# Patient Record
Sex: Female | Born: 1955 | Race: Black or African American | Hispanic: No | Smoking: Never smoker
Health system: Southern US, Community
[De-identification: ages and names within clinical notes are randomized; demographics above are authoritative.]

## PROBLEM LIST (undated history)

## (undated) DIAGNOSIS — M25472 Effusion, left ankle: Secondary | ICD-10-CM

## (undated) DIAGNOSIS — J309 Allergic rhinitis, unspecified: Secondary | ICD-10-CM

## (undated) DIAGNOSIS — T8459XA Infection and inflammatory reaction due to other internal joint prosthesis, initial encounter: Secondary | ICD-10-CM

## (undated) DIAGNOSIS — M2550 Pain in unspecified joint: Secondary | ICD-10-CM

## (undated) DIAGNOSIS — R519 Headache, unspecified: Secondary | ICD-10-CM

## (undated) DIAGNOSIS — M549 Dorsalgia, unspecified: Secondary | ICD-10-CM

## (undated) DIAGNOSIS — T8859XA Other complications of anesthesia, initial encounter: Secondary | ICD-10-CM

## (undated) DIAGNOSIS — R0602 Shortness of breath: Secondary | ICD-10-CM

## (undated) DIAGNOSIS — M25471 Effusion, right ankle: Secondary | ICD-10-CM

## (undated) DIAGNOSIS — K635 Polyp of colon: Secondary | ICD-10-CM

## (undated) DIAGNOSIS — Z86718 Personal history of other venous thrombosis and embolism: Secondary | ICD-10-CM

## (undated) DIAGNOSIS — R7303 Prediabetes: Secondary | ICD-10-CM

## (undated) DIAGNOSIS — Z7901 Long term (current) use of anticoagulants: Secondary | ICD-10-CM

## (undated) DIAGNOSIS — K589 Irritable bowel syndrome without diarrhea: Secondary | ICD-10-CM

## (undated) DIAGNOSIS — K219 Gastro-esophageal reflux disease without esophagitis: Secondary | ICD-10-CM

## (undated) DIAGNOSIS — M25474 Effusion, right foot: Secondary | ICD-10-CM

## (undated) DIAGNOSIS — K5900 Constipation, unspecified: Secondary | ICD-10-CM

## (undated) DIAGNOSIS — I456 Pre-excitation syndrome: Secondary | ICD-10-CM

## (undated) DIAGNOSIS — Z8719 Personal history of other diseases of the digestive system: Secondary | ICD-10-CM

## (undated) DIAGNOSIS — Z96659 Presence of unspecified artificial knee joint: Secondary | ICD-10-CM

## (undated) DIAGNOSIS — E559 Vitamin D deficiency, unspecified: Secondary | ICD-10-CM

## (undated) DIAGNOSIS — Z789 Other specified health status: Secondary | ICD-10-CM

## (undated) DIAGNOSIS — M25475 Effusion, left foot: Secondary | ICD-10-CM

## (undated) DIAGNOSIS — I2699 Other pulmonary embolism without acute cor pulmonale: Secondary | ICD-10-CM

## (undated) DIAGNOSIS — K5790 Diverticulosis of intestine, part unspecified, without perforation or abscess without bleeding: Secondary | ICD-10-CM

## (undated) DIAGNOSIS — I82409 Acute embolism and thrombosis of unspecified deep veins of unspecified lower extremity: Secondary | ICD-10-CM

## (undated) DIAGNOSIS — I10 Essential (primary) hypertension: Secondary | ICD-10-CM

## (undated) DIAGNOSIS — R5383 Other fatigue: Secondary | ICD-10-CM

## (undated) DIAGNOSIS — E785 Hyperlipidemia, unspecified: Secondary | ICD-10-CM

## (undated) DIAGNOSIS — D649 Anemia, unspecified: Secondary | ICD-10-CM

## (undated) DIAGNOSIS — G8929 Other chronic pain: Secondary | ICD-10-CM

## (undated) HISTORY — DX: Polyp of colon: K63.5

## (undated) HISTORY — DX: Effusion, right foot: M25.474

## (undated) HISTORY — DX: Allergic rhinitis, unspecified: J30.9

## (undated) HISTORY — DX: Anemia, unspecified: D64.9

## (undated) HISTORY — PX: DIAGNOSTIC LAPAROSCOPY: SUR761

## (undated) HISTORY — DX: Pre-excitation syndrome: I45.6

## (undated) HISTORY — DX: Other pulmonary embolism without acute cor pulmonale: I26.99

## (undated) HISTORY — DX: Vitamin D deficiency, unspecified: E55.9

## (undated) HISTORY — DX: Dorsalgia, unspecified: M54.9

## (undated) HISTORY — DX: Hyperlipidemia, unspecified: E78.5

## (undated) HISTORY — DX: Headache, unspecified: R51.9

## (undated) HISTORY — PX: WISDOM TOOTH EXTRACTION: SHX21

## (undated) HISTORY — DX: Infection and inflammatory reaction due to other internal joint prosthesis, initial encounter: T84.59XA

## (undated) HISTORY — DX: Presence of unspecified artificial knee joint: Z96.659

## (undated) HISTORY — PX: TUBAL LIGATION: SHX77

## (undated) HISTORY — DX: Effusion, left ankle: M25.475

## (undated) HISTORY — DX: Gastro-esophageal reflux disease without esophagitis: K21.9

## (undated) HISTORY — DX: Pain in unspecified joint: M25.50

## (undated) HISTORY — PX: KNEE ARTHROSCOPY: SUR90

## (undated) HISTORY — DX: Effusion, left foot: M25.471

## (undated) HISTORY — PX: CHOLECYSTECTOMY: SHX55

## (undated) HISTORY — DX: Long term (current) use of anticoagulants: Z79.01

## (undated) HISTORY — DX: Diverticulosis of intestine, part unspecified, without perforation or abscess without bleeding: K57.90

## (undated) HISTORY — DX: Shortness of breath: R06.02

## (undated) HISTORY — DX: Irritable bowel syndrome, unspecified: K58.9

## (undated) HISTORY — DX: Acute embolism and thrombosis of unspecified deep veins of unspecified lower extremity: I82.409

## (undated) HISTORY — DX: Other fatigue: R53.83

## (undated) HISTORY — DX: Constipation, unspecified: K59.00

## (undated) HISTORY — DX: Personal history of other venous thrombosis and embolism: Z86.718

## (undated) HISTORY — DX: Other chronic pain: G89.29

## (undated) HISTORY — PX: MULTIPLE TOOTH EXTRACTIONS: SHX2053

## (undated) HISTORY — DX: Effusion, left ankle: M25.472

## (undated) HISTORY — DX: Personal history of other diseases of the digestive system: Z87.19

## (undated) HISTORY — PX: COLONOSCOPY: SHX174

---

## 1997-08-16 ENCOUNTER — Other Ambulatory Visit: Admission: RE | Admit: 1997-08-16 | Discharge: 1997-08-16 | Payer: Self-pay | Admitting: *Deleted

## 1998-10-09 ENCOUNTER — Encounter: Payer: Self-pay | Admitting: Emergency Medicine

## 1998-10-09 ENCOUNTER — Observation Stay (HOSPITAL_COMMUNITY): Admission: EM | Admit: 1998-10-09 | Discharge: 1998-10-09 | Payer: Self-pay | Admitting: Emergency Medicine

## 1999-03-20 ENCOUNTER — Other Ambulatory Visit: Admission: RE | Admit: 1999-03-20 | Discharge: 1999-03-20 | Payer: Self-pay | Admitting: *Deleted

## 1999-04-08 ENCOUNTER — Encounter: Payer: Self-pay | Admitting: *Deleted

## 1999-04-08 ENCOUNTER — Ambulatory Visit (HOSPITAL_COMMUNITY): Admission: RE | Admit: 1999-04-08 | Discharge: 1999-04-08 | Payer: Self-pay | Admitting: *Deleted

## 1999-08-18 ENCOUNTER — Other Ambulatory Visit: Admission: RE | Admit: 1999-08-18 | Discharge: 1999-08-18 | Payer: Self-pay | Admitting: Orthopedic Surgery

## 2000-03-04 ENCOUNTER — Encounter: Payer: Self-pay | Admitting: Orthopedic Surgery

## 2000-03-04 ENCOUNTER — Encounter: Admission: RE | Admit: 2000-03-04 | Discharge: 2000-03-04 | Payer: Self-pay | Admitting: Orthopedic Surgery

## 2000-04-07 ENCOUNTER — Ambulatory Visit (HOSPITAL_COMMUNITY): Admission: RE | Admit: 2000-04-07 | Discharge: 2000-04-08 | Payer: Self-pay | Admitting: *Deleted

## 2000-04-08 ENCOUNTER — Encounter: Payer: Self-pay | Admitting: *Deleted

## 2000-05-20 ENCOUNTER — Ambulatory Visit (HOSPITAL_COMMUNITY): Admission: RE | Admit: 2000-05-20 | Discharge: 2000-05-20 | Payer: Self-pay | Admitting: *Deleted

## 2000-05-31 ENCOUNTER — Ambulatory Visit (HOSPITAL_COMMUNITY): Admission: RE | Admit: 2000-05-31 | Discharge: 2000-05-31 | Payer: Self-pay | Admitting: *Deleted

## 2000-06-08 ENCOUNTER — Emergency Department (HOSPITAL_COMMUNITY): Admission: EM | Admit: 2000-06-08 | Discharge: 2000-06-08 | Payer: Self-pay | Admitting: *Deleted

## 2001-03-20 ENCOUNTER — Encounter: Payer: Self-pay | Admitting: Emergency Medicine

## 2001-03-20 ENCOUNTER — Emergency Department (HOSPITAL_COMMUNITY): Admission: EM | Admit: 2001-03-20 | Discharge: 2001-03-20 | Payer: Self-pay | Admitting: Emergency Medicine

## 2001-05-20 ENCOUNTER — Emergency Department (HOSPITAL_COMMUNITY): Admission: EM | Admit: 2001-05-20 | Discharge: 2001-05-21 | Payer: Self-pay | Admitting: Emergency Medicine

## 2002-06-14 ENCOUNTER — Emergency Department (HOSPITAL_COMMUNITY): Admission: EM | Admit: 2002-06-14 | Discharge: 2002-06-14 | Payer: Self-pay | Admitting: Emergency Medicine

## 2002-06-14 ENCOUNTER — Encounter: Payer: Self-pay | Admitting: Emergency Medicine

## 2002-07-20 ENCOUNTER — Encounter: Admission: RE | Admit: 2002-07-20 | Discharge: 2002-07-20 | Payer: Self-pay | Admitting: Gastroenterology

## 2002-07-20 ENCOUNTER — Encounter: Payer: Self-pay | Admitting: Gastroenterology

## 2004-01-17 ENCOUNTER — Emergency Department (HOSPITAL_COMMUNITY): Admission: EM | Admit: 2004-01-17 | Discharge: 2004-01-17 | Payer: Self-pay | Admitting: Family Medicine

## 2005-05-11 ENCOUNTER — Encounter: Admission: RE | Admit: 2005-05-11 | Discharge: 2005-06-21 | Payer: Self-pay | Admitting: Cardiology

## 2006-10-06 ENCOUNTER — Ambulatory Visit (HOSPITAL_COMMUNITY): Admission: RE | Admit: 2006-10-06 | Discharge: 2006-10-06 | Payer: Self-pay | Admitting: Obstetrics and Gynecology

## 2006-10-06 ENCOUNTER — Encounter (INDEPENDENT_AMBULATORY_CARE_PROVIDER_SITE_OTHER): Payer: Self-pay | Admitting: Obstetrics and Gynecology

## 2008-01-05 DIAGNOSIS — I2699 Other pulmonary embolism without acute cor pulmonale: Secondary | ICD-10-CM

## 2008-01-05 HISTORY — PX: TOTAL KNEE ARTHROPLASTY: SHX125

## 2008-01-05 HISTORY — DX: Other pulmonary embolism without acute cor pulmonale: I26.99

## 2008-02-22 ENCOUNTER — Encounter: Admission: RE | Admit: 2008-02-22 | Discharge: 2008-02-22 | Payer: Self-pay | Admitting: Internal Medicine

## 2008-03-15 ENCOUNTER — Encounter: Admission: RE | Admit: 2008-03-15 | Discharge: 2008-03-15 | Payer: Self-pay | Admitting: Occupational Medicine

## 2008-07-26 ENCOUNTER — Observation Stay (HOSPITAL_COMMUNITY): Admission: RE | Admit: 2008-07-26 | Discharge: 2008-07-27 | Payer: Self-pay | Admitting: Orthopedic Surgery

## 2008-07-31 ENCOUNTER — Inpatient Hospital Stay (HOSPITAL_COMMUNITY): Admission: EM | Admit: 2008-07-31 | Discharge: 2008-08-05 | Payer: Self-pay | Admitting: Emergency Medicine

## 2008-07-31 ENCOUNTER — Ambulatory Visit: Payer: Self-pay | Admitting: Vascular Surgery

## 2008-07-31 ENCOUNTER — Encounter (INDEPENDENT_AMBULATORY_CARE_PROVIDER_SITE_OTHER): Payer: Self-pay | Admitting: Emergency Medicine

## 2008-08-02 ENCOUNTER — Encounter (INDEPENDENT_AMBULATORY_CARE_PROVIDER_SITE_OTHER): Payer: Self-pay | Admitting: Internal Medicine

## 2009-03-24 ENCOUNTER — Encounter: Admission: RE | Admit: 2009-03-24 | Discharge: 2009-03-24 | Payer: Self-pay | Admitting: Cardiology

## 2009-04-17 ENCOUNTER — Encounter (INDEPENDENT_AMBULATORY_CARE_PROVIDER_SITE_OTHER): Payer: Self-pay | Admitting: *Deleted

## 2009-04-21 ENCOUNTER — Ambulatory Visit: Payer: Self-pay | Admitting: Gastroenterology

## 2009-04-28 ENCOUNTER — Telehealth: Payer: Self-pay | Admitting: Gastroenterology

## 2009-05-04 DIAGNOSIS — K635 Polyp of colon: Secondary | ICD-10-CM

## 2009-05-04 HISTORY — DX: Polyp of colon: K63.5

## 2009-05-05 ENCOUNTER — Ambulatory Visit: Payer: Self-pay | Admitting: Gastroenterology

## 2009-05-07 ENCOUNTER — Encounter: Payer: Self-pay | Admitting: Gastroenterology

## 2009-09-11 ENCOUNTER — Ambulatory Visit: Payer: Self-pay | Admitting: Cardiology

## 2009-11-14 ENCOUNTER — Inpatient Hospital Stay (HOSPITAL_COMMUNITY): Admission: RE | Admit: 2009-11-14 | Discharge: 2009-11-19 | Payer: Self-pay | Admitting: Orthopedic Surgery

## 2009-11-17 ENCOUNTER — Ambulatory Visit: Payer: Self-pay | Admitting: Physical Medicine & Rehabilitation

## 2009-11-19 ENCOUNTER — Ambulatory Visit: Payer: Self-pay | Admitting: Vascular Surgery

## 2009-11-19 ENCOUNTER — Inpatient Hospital Stay (HOSPITAL_COMMUNITY)
Admission: RE | Admit: 2009-11-19 | Discharge: 2009-11-28 | Payer: Self-pay | Admitting: Physical Medicine & Rehabilitation

## 2009-11-22 ENCOUNTER — Ambulatory Visit: Payer: Self-pay | Admitting: Physical Medicine & Rehabilitation

## 2009-12-04 ENCOUNTER — Emergency Department (HOSPITAL_COMMUNITY)
Admission: EM | Admit: 2009-12-04 | Discharge: 2009-12-04 | Payer: Self-pay | Source: Home / Self Care | Admitting: Emergency Medicine

## 2009-12-12 ENCOUNTER — Ambulatory Visit: Payer: Self-pay | Admitting: Cardiology

## 2009-12-26 ENCOUNTER — Ambulatory Visit (HOSPITAL_COMMUNITY)
Admission: RE | Admit: 2009-12-26 | Discharge: 2009-12-27 | Payer: Self-pay | Source: Home / Self Care | Attending: Orthopedic Surgery | Admitting: Orthopedic Surgery

## 2010-01-02 ENCOUNTER — Observation Stay (HOSPITAL_COMMUNITY)
Admission: AD | Admit: 2010-01-02 | Discharge: 2010-01-03 | Payer: Self-pay | Source: Home / Self Care | Attending: Orthopedic Surgery | Admitting: Orthopedic Surgery

## 2010-01-04 ENCOUNTER — Inpatient Hospital Stay (HOSPITAL_COMMUNITY)
Admission: EM | Admit: 2010-01-04 | Discharge: 2010-01-20 | Payer: Self-pay | Source: Home / Self Care | Attending: Orthopedic Surgery | Admitting: Orthopedic Surgery

## 2010-01-07 ENCOUNTER — Other Ambulatory Visit: Payer: Self-pay | Admitting: Orthopedic Surgery

## 2010-01-07 LAB — CBC
HCT: 32.2 % — ABNORMAL LOW (ref 36.0–46.0)
Hemoglobin: 10.3 g/dL — ABNORMAL LOW (ref 12.0–15.0)
MCH: 27.2 pg (ref 26.0–34.0)
MCHC: 32 g/dL (ref 30.0–36.0)
MCV: 85 fL (ref 78.0–100.0)
Platelets: 203 10*3/uL (ref 150–400)
RBC: 3.79 MIL/uL — ABNORMAL LOW (ref 3.87–5.11)
RDW: 16 % — ABNORMAL HIGH (ref 11.5–15.5)
WBC: 21.7 10*3/uL — ABNORMAL HIGH (ref 4.0–10.5)

## 2010-01-07 LAB — BASIC METABOLIC PANEL
BUN: 8 mg/dL (ref 6–23)
CO2: 25 mEq/L (ref 19–32)
Calcium: 7.7 mg/dL — ABNORMAL LOW (ref 8.4–10.5)
Chloride: 105 mEq/L (ref 96–112)
Creatinine, Ser: 0.75 mg/dL (ref 0.4–1.2)
GFR calc Af Amer: >60 mL/min (ref >60)
GFR calc non Af Amer: >60 mL/min (ref >60)
Glucose, Bld: 126 mg/dL — ABNORMAL HIGH (ref 70–99)
Potassium: 3.5 mEq/L (ref 3.5–5.1)
Sodium: 138 mEq/L (ref 135–145)

## 2010-01-08 LAB — PROTIME-INR
INR: 1.64 — ABNORMAL HIGH (ref 0.00–1.49)
Prothrombin Time: 19.6 seconds — ABNORMAL HIGH (ref 11.6–15.2)

## 2010-01-09 LAB — CARDIAC PANEL(CRET KIN+CKTOT+MB+TROPI)
CK, MB: 1.8 ng/mL (ref 0.3–4.0)
Relative Index: INVALID (ref 0.0–2.5)
Total CK: 39 U/L (ref 7–177)
Troponin I: 0.03 ng/mL (ref 0.00–0.06)

## 2010-01-09 LAB — COMPREHENSIVE METABOLIC PANEL
ALT: 11 U/L (ref 0–35)
AST: 14 U/L (ref 0–37)
Albumin: 1.7 g/dL — ABNORMAL LOW (ref 3.5–5.2)
Alkaline Phosphatase: 89 U/L (ref 39–117)
BUN: 6 mg/dL (ref 6–23)
CO2: 26 mEq/L (ref 19–32)
Calcium: 8.2 mg/dL — ABNORMAL LOW (ref 8.4–10.5)
Chloride: 105 mEq/L (ref 96–112)
Creatinine, Ser: 0.86 mg/dL (ref 0.4–1.2)
GFR calc Af Amer: >60 mL/min (ref >60)
GFR calc non Af Amer: >60 mL/min (ref >60)
Glucose, Bld: 104 mg/dL — ABNORMAL HIGH (ref 70–99)
Potassium: 3.4 mEq/L — ABNORMAL LOW (ref 3.5–5.1)
Sodium: 139 mEq/L (ref 135–145)
Total Bilirubin: 1.2 mg/dL (ref 0.3–1.2)
Total Protein: 5.6 g/dL — ABNORMAL LOW (ref 6.0–8.3)

## 2010-01-09 LAB — CBC
HCT: 31.3 % — ABNORMAL LOW (ref 36.0–46.0)
Hemoglobin: 10.1 g/dL — ABNORMAL LOW (ref 12.0–15.0)
MCH: 26.7 pg (ref 26.0–34.0)
MCHC: 32.3 g/dL (ref 30.0–36.0)
MCV: 82.8 fL (ref 78.0–100.0)
Platelets: 212 10*3/uL (ref 150–400)
RBC: 3.78 MIL/uL — ABNORMAL LOW (ref 3.87–5.11)
RDW: 16.5 % — ABNORMAL HIGH (ref 11.5–15.5)
WBC: 19.1 10*3/uL — ABNORMAL HIGH (ref 4.0–10.5)

## 2010-01-09 LAB — PROTIME-INR
INR: 1.52 — ABNORMAL HIGH (ref 0.00–1.49)
Prothrombin Time: 18.5 seconds — ABNORMAL HIGH (ref 11.6–15.2)

## 2010-01-09 LAB — DIFFERENTIAL
Basophils Absolute: 0 10*3/uL (ref 0.0–0.1)
Basophils Relative: 0 % (ref 0–1)
Eosinophils Absolute: 0.4 10*3/uL (ref 0.0–0.7)
Eosinophils Relative: 2 % (ref 0–5)
Lymphocytes Relative: 6 % — ABNORMAL LOW (ref 12–46)
Lymphs Abs: 1.1 10*3/uL (ref 0.7–4.0)
Monocytes Absolute: 1.3 10*3/uL — ABNORMAL HIGH (ref 0.1–1.0)
Monocytes Relative: 7 % (ref 3–12)
Neutro Abs: 16.3 10*3/uL — ABNORMAL HIGH (ref 1.7–7.7)
Neutrophils Relative %: 86 % — ABNORMAL HIGH (ref 43–77)

## 2010-01-09 LAB — VANCOMYCIN, TROUGH: Vancomycin Tr: 20.1 ug/mL — ABNORMAL HIGH (ref 10.0–20.0)

## 2010-01-09 LAB — HEPARIN ANTI-XA: Heparin LMW: 0.93 IU/mL

## 2010-01-09 LAB — SEDIMENTATION RATE: Sed Rate: 79 mm/hr — ABNORMAL HIGH (ref 0–22)

## 2010-01-10 ENCOUNTER — Encounter (INDEPENDENT_AMBULATORY_CARE_PROVIDER_SITE_OTHER): Payer: Self-pay | Admitting: Internal Medicine

## 2010-01-15 ENCOUNTER — Encounter: Payer: Self-pay | Admitting: Infectious Disease

## 2010-01-19 LAB — COMPREHENSIVE METABOLIC PANEL
ALT: 35 U/L (ref 0–35)
ALT: 39 U/L — ABNORMAL HIGH (ref 0–35)
ALT: 29 U/L (ref 0–35)
ALT: 24 U/L (ref 0–35)
AST: 84 U/L — ABNORMAL HIGH (ref 0–37)
AST: 73 U/L — ABNORMAL HIGH (ref 0–37)
AST: 32 U/L (ref 0–37)
AST: 22 U/L (ref 0–37)
Albumin: 1.6 g/dL — ABNORMAL LOW (ref 3.5–5.2)
Albumin: 2 g/dL — ABNORMAL LOW (ref 3.5–5.2)
Albumin: 2 g/dL — ABNORMAL LOW (ref 3.5–5.2)
Albumin: 2 g/dL — ABNORMAL LOW (ref 3.5–5.2)
Alkaline Phosphatase: 153 U/L — ABNORMAL HIGH (ref 39–117)
Alkaline Phosphatase: 159 U/L — ABNORMAL HIGH (ref 39–117)
Alkaline Phosphatase: 139 U/L — ABNORMAL HIGH (ref 39–117)
Alkaline Phosphatase: 117 U/L (ref 39–117)
BUN: 6 mg/dL (ref 6–23)
BUN: 3 mg/dL — ABNORMAL LOW (ref 6–23)
BUN: 3 mg/dL — ABNORMAL LOW (ref 6–23)
BUN: 4 mg/dL — ABNORMAL LOW (ref 6–23)
CO2: 24 mEq/L (ref 19–32)
CO2: 25 mEq/L (ref 19–32)
CO2: 26 mEq/L (ref 19–32)
CO2: 26 mEq/L (ref 19–32)
Calcium: 7.7 mg/dL — ABNORMAL LOW (ref 8.4–10.5)
Calcium: 8.5 mg/dL (ref 8.4–10.5)
Calcium: 8.3 mg/dL — ABNORMAL LOW (ref 8.4–10.5)
Calcium: 8.1 mg/dL — ABNORMAL LOW (ref 8.4–10.5)
Chloride: 109 mEq/L (ref 96–112)
Chloride: 107 mEq/L (ref 96–112)
Chloride: 107 mEq/L (ref 96–112)
Chloride: 106 mEq/L (ref 96–112)
Creatinine, Ser: 0.67 mg/dL (ref 0.4–1.2)
Creatinine, Ser: 0.75 mg/dL (ref 0.4–1.2)
Creatinine, Ser: 0.77 mg/dL (ref 0.4–1.2)
Creatinine, Ser: 0.72 mg/dL (ref 0.4–1.2)
GFR calc Af Amer: >60 mL/min (ref >60)
GFR calc Af Amer: >60 mL/min (ref >60)
GFR calc Af Amer: >60 mL/min (ref >60)
GFR calc Af Amer: >60 mL/min (ref >60)
GFR calc non Af Amer: >60 mL/min (ref >60)
GFR calc non Af Amer: >60 mL/min (ref >60)
GFR calc non Af Amer: >60 mL/min (ref >60)
GFR calc non Af Amer: >60 mL/min (ref >60)
Glucose, Bld: 92 mg/dL (ref 70–99)
Glucose, Bld: 102 mg/dL — ABNORMAL HIGH (ref 70–99)
Glucose, Bld: 106 mg/dL — ABNORMAL HIGH (ref 70–99)
Glucose, Bld: 125 mg/dL — ABNORMAL HIGH (ref 70–99)
Potassium: 3.9 mEq/L (ref 3.5–5.1)
Potassium: 3.8 mEq/L (ref 3.5–5.1)
Potassium: 3.7 mEq/L (ref 3.5–5.1)
Potassium: 3.6 mEq/L (ref 3.5–5.1)
Sodium: 139 mEq/L (ref 135–145)
Sodium: 141 mEq/L (ref 135–145)
Sodium: 140 mEq/L (ref 135–145)
Sodium: 140 mEq/L (ref 135–145)
Total Bilirubin: 0.8 mg/dL (ref 0.3–1.2)
Total Bilirubin: 0.7 mg/dL (ref 0.3–1.2)
Total Bilirubin: 0.5 mg/dL (ref 0.3–1.2)
Total Bilirubin: 0.7 mg/dL (ref 0.3–1.2)
Total Protein: 5.4 g/dL — ABNORMAL LOW (ref 6.0–8.3)
Total Protein: 5.9 g/dL — ABNORMAL LOW (ref 6.0–8.3)
Total Protein: 6 g/dL (ref 6.0–8.3)
Total Protein: 5.8 g/dL — ABNORMAL LOW (ref 6.0–8.3)

## 2010-01-19 LAB — CULTURE, BLOOD (ROUTINE X 2)
Culture  Setup Time: 201201050545
Culture  Setup Time: 201201050545
Culture: NO GROWTH
Culture: NO GROWTH

## 2010-01-19 LAB — CBC
HCT: 27.6 % — ABNORMAL LOW (ref 36.0–46.0)
HCT: 28.7 % — ABNORMAL LOW (ref 36.0–46.0)
HCT: 28 % — ABNORMAL LOW (ref 36.0–46.0)
HCT: 28 % — ABNORMAL LOW (ref 36.0–46.0)
HCT: 29.1 % — ABNORMAL LOW (ref 36.0–46.0)
HCT: 28.9 % — ABNORMAL LOW (ref 36.0–46.0)
Hemoglobin: 9.1 g/dL — ABNORMAL LOW (ref 12.0–15.0)
Hemoglobin: 9.4 g/dL — ABNORMAL LOW (ref 12.0–15.0)
Hemoglobin: 9.3 g/dL — ABNORMAL LOW (ref 12.0–15.0)
Hemoglobin: 9.1 g/dL — ABNORMAL LOW (ref 12.0–15.0)
Hemoglobin: 9.4 g/dL — ABNORMAL LOW (ref 12.0–15.0)
Hemoglobin: 9.3 g/dL — ABNORMAL LOW (ref 12.0–15.0)
MCH: 26.8 pg (ref 26.0–34.0)
MCH: 26.7 pg (ref 26.0–34.0)
MCH: 27 pg (ref 26.0–34.0)
MCH: 26.8 pg (ref 26.0–34.0)
MCH: 26.6 pg (ref 26.0–34.0)
MCH: 26.1 pg (ref 26.0–34.0)
MCHC: 33 g/dL (ref 30.0–36.0)
MCHC: 32.8 g/dL (ref 30.0–36.0)
MCHC: 33.2 g/dL (ref 30.0–36.0)
MCHC: 32.5 g/dL (ref 30.0–36.0)
MCHC: 32.3 g/dL (ref 30.0–36.0)
MCHC: 32.2 g/dL (ref 30.0–36.0)
MCV: 81.4 fL (ref 78.0–100.0)
MCV: 81.5 fL (ref 78.0–100.0)
MCV: 81.2 fL (ref 78.0–100.0)
MCV: 82.4 fL (ref 78.0–100.0)
MCV: 82.2 fL (ref 78.0–100.0)
MCV: 81.2 fL (ref 78.0–100.0)
Platelets: 207 10*3/uL (ref 150–400)
Platelets: 236 10*3/uL (ref 150–400)
Platelets: 247 10*3/uL (ref 150–400)
Platelets: 265 10*3/uL (ref 150–400)
Platelets: 280 10*3/uL (ref 150–400)
Platelets: 269 10*3/uL (ref 150–400)
RBC: 3.39 MIL/uL — ABNORMAL LOW (ref 3.87–5.11)
RBC: 3.52 MIL/uL — ABNORMAL LOW (ref 3.87–5.11)
RBC: 3.45 MIL/uL — ABNORMAL LOW (ref 3.87–5.11)
RBC: 3.4 MIL/uL — ABNORMAL LOW (ref 3.87–5.11)
RBC: 3.54 MIL/uL — ABNORMAL LOW (ref 3.87–5.11)
RBC: 3.56 MIL/uL — ABNORMAL LOW (ref 3.87–5.11)
RDW: 16.6 % — ABNORMAL HIGH (ref 11.5–15.5)
RDW: 17 % — ABNORMAL HIGH (ref 11.5–15.5)
RDW: 16.9 % — ABNORMAL HIGH (ref 11.5–15.5)
RDW: 17 % — ABNORMAL HIGH (ref 11.5–15.5)
RDW: 16.9 % — ABNORMAL HIGH (ref 11.5–15.5)
RDW: 16.8 % — ABNORMAL HIGH (ref 11.5–15.5)
WBC: 16.1 10*3/uL — ABNORMAL HIGH (ref 4.0–10.5)
WBC: 13.8 10*3/uL — ABNORMAL HIGH (ref 4.0–10.5)
WBC: 14.3 10*3/uL — ABNORMAL HIGH (ref 4.0–10.5)
WBC: 12.9 10*3/uL — ABNORMAL HIGH (ref 4.0–10.5)
WBC: 11.7 10*3/uL — ABNORMAL HIGH (ref 4.0–10.5)
WBC: 9.7 10*3/uL (ref 4.0–10.5)

## 2010-01-19 LAB — GLUCOSE, CAPILLARY: Glucose-Capillary: 91 mg/dL (ref 70–99)

## 2010-01-19 LAB — CK: Total CK: 26 U/L (ref 7–177)

## 2010-01-19 LAB — BASIC METABOLIC PANEL
BUN: 6 mg/dL (ref 6–23)
BUN: 4 mg/dL — ABNORMAL LOW (ref 6–23)
BUN: 6 mg/dL (ref 6–23)
CO2: 25 mEq/L (ref 19–32)
CO2: 26 mEq/L (ref 19–32)
CO2: 24 mEq/L (ref 19–32)
Calcium: 7.8 mg/dL — ABNORMAL LOW (ref 8.4–10.5)
Calcium: 7.8 mg/dL — ABNORMAL LOW (ref 8.4–10.5)
Calcium: 8 mg/dL — ABNORMAL LOW (ref 8.4–10.5)
Chloride: 108 mEq/L (ref 96–112)
Chloride: 111 mEq/L (ref 96–112)
Chloride: 110 mEq/L (ref 96–112)
Creatinine, Ser: 0.78 mg/dL (ref 0.4–1.2)
Creatinine, Ser: 0.7 mg/dL (ref 0.4–1.2)
Creatinine, Ser: 0.64 mg/dL (ref 0.4–1.2)
GFR calc Af Amer: >60 mL/min (ref >60)
GFR calc Af Amer: >60 mL/min (ref >60)
GFR calc Af Amer: >60 mL/min (ref >60)
GFR calc non Af Amer: >60 mL/min (ref >60)
GFR calc non Af Amer: >60 mL/min (ref >60)
GFR calc non Af Amer: >60 mL/min (ref >60)
Glucose, Bld: 95 mg/dL (ref 70–99)
Glucose, Bld: 113 mg/dL — ABNORMAL HIGH (ref 70–99)
Glucose, Bld: 96 mg/dL (ref 70–99)
Potassium: 3.4 mEq/L — ABNORMAL LOW (ref 3.5–5.1)
Potassium: 3.9 mEq/L (ref 3.5–5.1)
Potassium: 4 mEq/L (ref 3.5–5.1)
Sodium: 139 mEq/L (ref 135–145)
Sodium: 141 mEq/L (ref 135–145)
Sodium: 141 mEq/L (ref 135–145)

## 2010-01-19 LAB — DIFFERENTIAL
Basophils Absolute: 0 10*3/uL (ref 0.0–0.1)
Basophils Absolute: 0 10*3/uL (ref 0.0–0.1)
Basophils Relative: 0 % (ref 0–1)
Basophils Relative: 0 % (ref 0–1)
Eosinophils Absolute: 0.3 10*3/uL (ref 0.0–0.7)
Eosinophils Absolute: 0.4 10*3/uL (ref 0.0–0.7)
Eosinophils Relative: 2 % (ref 0–5)
Eosinophils Relative: 4 % (ref 0–5)
Lymphocytes Relative: 7 % — ABNORMAL LOW (ref 12–46)
Lymphocytes Relative: 15 % (ref 12–46)
Lymphs Abs: 1.1 10*3/uL (ref 0.7–4.0)
Lymphs Abs: 1.5 10*3/uL (ref 0.7–4.0)
Monocytes Absolute: 1.3 10*3/uL — ABNORMAL HIGH (ref 0.1–1.0)
Monocytes Absolute: 0.6 10*3/uL (ref 0.1–1.0)
Monocytes Relative: 8 % (ref 3–12)
Monocytes Relative: 6 % (ref 3–12)
Neutro Abs: 13.4 10*3/uL — ABNORMAL HIGH (ref 1.7–7.7)
Neutro Abs: 7.2 10*3/uL (ref 1.7–7.7)
Neutrophils Relative %: 84 % — ABNORMAL HIGH (ref 43–77)
Neutrophils Relative %: 75 % (ref 43–77)

## 2010-01-19 LAB — CARDIAC PANEL(CRET KIN+CKTOT+MB+TROPI)
CK, MB: 1.1 ng/mL (ref 0.3–4.0)
CK, MB: 1.2 ng/mL (ref 0.3–4.0)
Relative Index: INVALID (ref 0.0–2.5)
Relative Index: INVALID (ref 0.0–2.5)
Total CK: 43 U/L (ref 7–177)
Total CK: 35 U/L (ref 7–177)
Troponin I: 0.02 ng/mL (ref 0.00–0.06)
Troponin I: 0.02 ng/mL (ref 0.00–0.06)

## 2010-01-19 LAB — HIV ANTIBODY (ROUTINE TESTING W REFLEX): HIV: NONREACTIVE

## 2010-01-19 LAB — PROTIME-INR
INR: 1.93 — ABNORMAL HIGH (ref 0.00–1.49)
INR: 2.45 — ABNORMAL HIGH (ref 0.00–1.49)
INR: 2.25 — ABNORMAL HIGH (ref 0.00–1.49)
INR: 2.34 — ABNORMAL HIGH (ref 0.00–1.49)
INR: 1.68 — ABNORMAL HIGH (ref 0.00–1.49)
INR: 1.52 — ABNORMAL HIGH (ref 0.00–1.49)
INR: 1.82 — ABNORMAL HIGH (ref 0.00–1.49)
INR: 1.89 — ABNORMAL HIGH (ref 0.00–1.49)
INR: 1.95 — ABNORMAL HIGH (ref 0.00–1.49)
INR: 1.92 — ABNORMAL HIGH (ref 0.00–1.49)
Prothrombin Time: 22.2 seconds — ABNORMAL HIGH (ref 11.6–15.2)
Prothrombin Time: 26.7 seconds — ABNORMAL HIGH (ref 11.6–15.2)
Prothrombin Time: 25 seconds — ABNORMAL HIGH (ref 11.6–15.2)
Prothrombin Time: 25.8 seconds — ABNORMAL HIGH (ref 11.6–15.2)
Prothrombin Time: 20 seconds — ABNORMAL HIGH (ref 11.6–15.2)
Prothrombin Time: 18.5 seconds — ABNORMAL HIGH (ref 11.6–15.2)
Prothrombin Time: 21.2 seconds — ABNORMAL HIGH (ref 11.6–15.2)
Prothrombin Time: 21.9 seconds — ABNORMAL HIGH (ref 11.6–15.2)
Prothrombin Time: 22.4 seconds — ABNORMAL HIGH (ref 11.6–15.2)
Prothrombin Time: 22.1 seconds — ABNORMAL HIGH (ref 11.6–15.2)

## 2010-01-19 LAB — MAGNESIUM
Magnesium: 1.6 mg/dL (ref 1.5–2.5)
Magnesium: 2.1 mg/dL (ref 1.5–2.5)

## 2010-01-19 LAB — LACTIC ACID, PLASMA: Lactic Acid, Venous: 1.3 mmol/L (ref 0.5–2.2)

## 2010-01-19 LAB — HEPATITIS PANEL, ACUTE
HCV Ab: NEGATIVE
Hep A IgM: NEGATIVE
Hep B C IgM: NEGATIVE
Hepatitis B Surface Ag: NEGATIVE

## 2010-01-19 LAB — HEMOCCULT GUIAC POC 1CARD (OFFICE)
Fecal Occult Bld: NEGATIVE
Fecal Occult Bld: NEGATIVE

## 2010-01-19 LAB — GAMMA GT: GGT: 119 U/L — ABNORMAL HIGH (ref 7–51)

## 2010-01-21 LAB — PROTIME-INR
INR: 1.92 — ABNORMAL HIGH (ref 0.00–1.49)
INR: 1.96 — ABNORMAL HIGH (ref 0.00–1.49)
Prothrombin Time: 22.1 seconds — ABNORMAL HIGH (ref 11.6–15.2)
Prothrombin Time: 22.5 seconds — ABNORMAL HIGH (ref 11.6–15.2)

## 2010-01-23 NOTE — Discharge Summary (Signed)
Mia [name], Mia [name]            ACCOUNT NO.:  [id number]  MEDICAL RECORD NO.:  [id number]          PATIENT TYPE:  INP  LOCATION:  1611                         FACILITY:  [facility]  PHYSICIAN:  Almedia Balls. Ranell Patrick, M.D. DATE OF BIRTH:  [birth year]  DATE OF ADMISSION:  01/04/2010 DATE OF DISCHARGE:  01/15/2010                              DISCHARGE SUMMARY   ADMISSION DIAGNOSIS:  Right knee infection status post total knee arthroplasty.  DISCHARGE DIAGNOSES: 1. Room right knee infection status post incision and drainage and     polyethylene exchange 2. Deep vein thrombosis, right upper extremity. 3. Bilateral pulmonary emboli. 4. Drug rash to lumbar spine, right leg, and left leg.  BRIEF HISTORY:  The patient is a 55 year old female who had total knee arthroplasty about 6 weeks ago.  Prior to admission, the patient returned to the office with some increased redness.  The patient was admitted by Dr. Ranell Patrick on January 02, 2010, for superficial infection. It appeared that her infection was getting worse and thus she was taken for a formal irrigation and debridement by Dr. Ranell Patrick with continued treatment in the hospital.  PROCEDURE:  The patient had I and D of a right total knee arthroplasty by Dr. Malon Kindle on January 05, 2010 with polyethylene exchange. General anesthesia was used.  No complications.  HOSPITAL COURSE:  The patient admitted on January 02, 2010, where she underwent IV antibiotics which she did not respond to in regards to her infection and thus she was taken for a formal I and D.  Procedure note as above.  The patient tolerated the procedure well.  After adequate time in postanesthesia care unit, she was transferred up to [address].  On postop day #1, the patient complained about moderate pain in the right knee.  Labs were negative for any type of bacteria.  White blood count and leukocytosis was dropping from 27,000 slowly to within normal limits before discharge  from the hospital.  The patient did develop some right upper extremity pain and swelling status post PICC line placement.  The patient had Doppler scan.  It was noted that she had a DVT of the right upper extremity.  Thus, she had a PICC line placed into the left upper extremity.  The patient also complained about some congestion.  However, next couple of days we did get Medicine involved because she began developing some tachycardia and also some hypertension.  We also had Infectious Disease consult on her knee due to the infection.  So thus we had Infectious Disease and Internal Medicine following with Mr. Cantrell along with [place].  The patient was noted to have bilateral pulmonary emboli on the CT scan of her chest.  She was initiated on Coumadin and Lovenox bridging.  She was taking Xarelto status post surgery but does have a history of DVT in the past.  The patient did develop a questionable drug rash to the right leg, also the lumbar spine that seem to the be improving somewhat before discharge from the hospital.  Infectious Disease and Internal Medicine did try changing some of medicines to see if we can make the rash  improve and did improve somewhat before discharge from the hospital.  The patient continued to improve.  On January 19, 2010, she was doing quite well, moving around, knee did not hurt at all.  She was working well with physical therapy.  Drug rash resulted in her right lower extremity and lumbar spine was healing quite well and otherwise she was stable for discharge to inpatient rehab.  DISCHARGE/PLAN:  The patient will be discharged to inpatient rehab once bed is available.  CONDITION ON DISCHARGE:  Stable.  DIET:  Her diet is low sodium.  ALLERGIES:  The patient has allergies to ZOSYN and CEFEPIME.  DISCHARGE MEDICATIONS: 1. Daptomycin 800 mg IV daily. 2. Lovenox 30 mg subcu b.i.d. 3. Zetia 10 mg q.h.s. 4. Lasix 40 mg daily. 5. Hydrochlorothiazide 25 mg  daily. 6. Robaxin 500 mg p.o. q.i.d. 7. Lopressor 25 mg b.i.d. 8. Benicar 40 mg daily. 9. Zocor 20 mg daily. 10.Potassium chloride 20 mEq b.i.d. 11.Percocet 5/325 one to two tabs q.4-6 h. p.r.n. pain.  FOLLOWUP:  The patient to follow back up with Dr. Malon Kindle in about 2 weeks.     Mia [name], P.A.   ______________________________ Almedia Balls. Ranell Patrick, M.D.    TBD/MEDQ  D:  01/19/2010  T:  01/19/2010  Job:  [id number]  Electronically Signed by Standley Dakins P.A. on 01/20/2010 03:02:28 PM Electronically Signed by Malon Kindle  on 01/23/2010 09:42:56 AM

## 2010-01-25 ENCOUNTER — Encounter: Payer: Self-pay | Admitting: Cardiology

## 2010-01-27 ENCOUNTER — Ambulatory Visit: Payer: Self-pay | Admitting: Cardiology

## 2010-01-30 NOTE — H&P (Signed)
  [name], [name]            ACCOUNT NO.:  [id number]  MEDICAL RECORD NO.:  [id number]          PATIENT TYPE:  INP  LOCATION:  1614                         FACILITY:  [facility]  PHYSICIAN:  Leonides Grills, M.D.     DATE OF BIRTH:  July 09, 1955  DATE OF ADMISSION:  01/02/2010 DATE OF DISCHARGE:                             HISTORY & PHYSICAL   CHIEF COMPLAINT:  Right knee wound dehiscence/potential infection, status post total knee arthroplasty with subsequent I and D.  HISTORY:  Mia [name] is a 55 year old female, status post right total knee arthroplasty, November 14, 2009, presents today with right knee pain, redness, and reported pus drainage.  The patient underwent I and D of the knee by Dr. Ranell Patrick on December 26, 2009.  She currently is on no antibiotics.  She states she has had no fevers, positive chills.  PAST MEDICAL HISTORY:  Positive for hyperlipidemia, hypertension, IBS, history of DVT, and morbid obesity.  FAMILY HISTORY:  Positive for coronary artery disease and cancer.  SOCIAL HISTORY:  The patient lives in a single-story home of 5 steps to the normal entrance with her boyfriend and son.  DRUG ALLERGIES:  None.  MEDICATIONS:  Zetia, pravastatin, Protonix, Micardis, aspirin, and potassium.  REVIEW OF SYSTEMS:  Positive for hypertension, history of DVT, and hyperlipidemia.  Positive for chills.  Negative for diabetes, gout, rheumatoid arthritis, heart disease, or any peripheral vascular disease.  PHYSICAL EXAMINATION:  VITAL SIGNS:  Temperature is 98.5, respirations 18 and unlabored. GENERAL:  The patient is an obese female, in no acute distress. CHEST:  Lungs clear to auscultation bilaterally without wheezing, rhonchi, or rales. HEART:  Regular rate and rhythm.  No murmurs, rubs, or gallops noted. SKIN:  She has erythema about the right knee with mid incision  dehiscence of wound with nylon sutures approximately in the proximal and distal portions of the  wound. EXTREMITIES:  Right knee 0 to 90 degrees.  She has tenderness in the anterior aspect of knee and lateral joint line.  Dorsal pedal pulse 2+ bilaterally.  Calves supple, nontender bilaterally. NEUROLOGIC:  Sensation intact in bilateral feet L4 through S1. Extraocular eye movements intact.  IMPRESSION:  Status post right total knee, November 14, 2009, with subsequent incision and drainage of right knee on December 26, 2009, now with increased pain, anticipated erythema, and warmth.  PLAN: 1. We will admit her for PICC line placement, IV antibiotics,     observation, most likely less than 24 hours.  Set up home IV     antibiotics. 2. Questions were encouraged and answered with patient.     [name], P.A.   ______________________________ Leonides Grills, M.D.    GC/MEDQ  D:  01/02/2010  T:  01/02/2010  Job:  [id number]  cc:   Almedia Balls. Ranell Patrick, M.D. Fax: [phone]  Leonides Grills, M.D. Fax: [phone]  Electronically Signed by [name] P.A. on 01/20/2010 09:02:29 AM Electronically Signed by Leonides Grills M.D. on 01/30/2010 03:43:12 PM

## 2010-01-30 NOTE — Op Note (Signed)
NAMERICKIA, FREEBURG            ACCOUNT NO.:  [id number]  MEDICAL RECORD NO.:  [id number]          PATIENT TYPE:  INP  LOCATION:  1611                         FACILITY:  [facility]  PHYSICIAN:  Vania Rea. Ahmoni Edge, M.D.  DATE OF BIRTH:  May 02, 1955  DATE OF PROCEDURE: DATE OF DISCHARGE:                              OPERATIVE REPORT   ADMISSION DIAGNOSIS:  Probable septic right total-knee arthroplasty.  HISTORY:  Mia [name] is a 55 year old female patient of Dr. Malon Kindle who underwent a right total-knee arthroplasty on November 14, 2009.  Postoperatively she had difficulties with wound healing with a dehiscence and persistent drainage which was treated with local wound care and she was ultimately taken back to surgery on December 23 where a superficial debridement of her wound was performed.  At that time joint fluid was aspirated and it did show abundant WBCs but ultimately cultures did not grow out any organisms.  She was subsequently discharged home but returned to the emergency room early in the morning of Saturday, December 31 with complaints of increased knee pain and was evaluated by Dr. Lestine Box.  She was subsequently started on IV vancomycin by way of a PICC line and discharged with home IV therapy.  She reports progressive increasing right knee and leg pain and returned to the emergency room early this morning with complaints of fevers, chills and myalgias.  Repeat laboratory studies showed an elevated white count at 17.8 with an elevated temperature and she has subsequently been admitted for further evaluation and treatment recommendations.  PAST MEDICAL HISTORY:  Ms. Wedin past medical history is significant for hypertension, irritable bowel syndrome, previous history of DVTs with PEs, morbid obesity, migraine headaches and vertigo.  FAMILY HISTORY:  She has a family history of coronary artery disease and cancer.  CARDIOLOGIST:  Her cardiologist is Dr.  Patty Sermons.  ALLERGIES:  SHE HAS NO KNOWN DRUG ALLERGIES.  CURRENT MEDICATIONS: 1. Vancomycin 1500 mg q.8 hours. 2. Theracon one p.o. b.i.d. with meals. 3. Pantoprazole 40 mg one p.o. daily. 4. Hydrochlorothiazide 25 mg one-half tablet p.o. daily. 5. Furosemide 40 mg p.o. daily. 6. Potassium 20 mEq p.o. b.i.d. 7. Pravastatin 40 mg p.o. q.h.s. 8. Carisoprodol 350 mg p.o. q.8 hours p.r.n. spasm. 9. She is currently on Percocet for pain control.  PHYSICAL EXAMINATION:  GENERAL:  Ms. Conant is a morbidly obese black female who is alert and oriented and in moderate discomfort secondary to her right leg pain.  There is no shortness of breath. EXTREMITIES:  Orthopedic examination focused on the right lower extremity does show 1+ edema from the midthigh distally.  She is neurovascular intact distally.  She has diffuse tenderness with palpation about the hip and thigh.  The knee incision shows an approximately 3 cm open wound at the midportion of her previous anterior midline incision with some scant serous drainage.  There is moderate pain with passive motion of the right knee but I do not appreciate any gross varus or valgus laxity.  Presence of an adhesion is difficult to ascertain due to her morbid obesity.  LABORATORY STUDIES:  Current white cell count is 17.8, sedimentation rate 26, C-reactive  protein 25.4.  Her urinalysis is clear.  IMPRESSION:  Probable septic arthritis, right total-knee arthroplasty.  PLAN:  I have counseled Ms. Deist and her husband on today's findings. We have also had an opportunity to speak with Dr. Malon Kindle on the phone this morning.  At this time Dr. Ranell Patrick will follow up to discuss with Ms. Hyacinth Meeker ongoing management.  We have made her n.p.o. in anticipation of surgical debridement.     Vania Rea. Sarp Vernier, M.D.     KMS/MEDQ  D:  01/05/2010  T:  01/05/2010  Job:  [id number]  Electronically Signed by Francena Hanly M.D. on 01/28/2010 07:44:07 AM

## 2010-02-02 ENCOUNTER — Encounter: Payer: Self-pay | Admitting: Infectious Disease

## 2010-02-03 NOTE — Miscellaneous (Signed)
Summary: previsit/rm  Clinical Lists Changes  Medications: Added new medication of MOVIPREP 100 GM  SOLR (PEG-KCL-NACL-NASULF-NA ASC-C) As per prep instructions. - Signed Rx of MOVIPREP 100 GM  SOLR (PEG-KCL-NACL-NASULF-NA ASC-C) As per prep instructions.;  #1 x 0;  Signed;  Entered by: Sherren Kerns RN;  Authorized by: Rachael Fee MD;  Method used: Electronically to [facility] [address] [phone]*, [address] [place], [place]  [address], Ph: [phone], Fax: [phone] Observations: Added new observation of ALLERGY REV: Done (04/21/2009 15:49) Added new observation of NKA: T (04/21/2009 15:49)    Prescriptions: MOVIPREP 100 GM  SOLR (PEG-KCL-NACL-NASULF-NA ASC-C) As per prep instructions.  #1 x 0   Entered by:   Sherren Kerns RN   Authorized by:   Rachael Fee MD   Signed by:   Sherren Kerns RN on 04/21/2009   Method used:   Electronically to        [facility] Dr[address] [phone]* (retail)       [address]       [address]       [place], [place]  [address]       Ph: [phone]       Fax: [phone]   RxID:   [id number]

## 2010-02-03 NOTE — Letter (Signed)
Summary: Results Letter  [facility]  [address] [place], [place] [address]   Phone: [phone]  Fax: [phone]        May 07, 2009 MRN: [id number]    Mia [name] [address] [place], [place]  [address]    Dear Ms. Chisolm,   Good news.  The polyp(s) that were removed during your recent procedure were NOT pre-cancerous.  You should continue to follow current colorectal cancer screening guidelines with a repeat colonoscopy in 10 years.  We will therefore put your information in our reminder system and will contact you in 10 years to schedule a repeat procedure.  Please call if you have any questions or concerns.       Sincerely,  Rachael Fee MD  This letter has been electronically signed by your physician.  Appended Document: Results Letter letter mailed 5.6.11

## 2010-02-03 NOTE — Letter (Signed)
Summary: [facility] Instructions  [facility]  [address] [place], [place] [address]   Phone: [phone]  Fax: [phone]       Mia [name]    1955-05-19    MRN: [id number]        Procedure Day [name]:  [name]  05/05/09     Arrival Time:  8:30AM     Procedure Time:  9:30AM     Location of Procedure:                    [name]  [facility] (4th Floor)                        PREPARATION FOR COLONOSCOPY WITH MOVIPREP   Starting 5 days prior to your procedure 04/30/09 do not eat nuts, seeds, popcorn, corn, beans, peas,  salads, or any raw vegetables.  Do not take any fiber supplements (e.g. Metamucil, Citrucel, and Benefiber).  THE DAY BEFORE YOUR PROCEDURE         DATE: 05/04/09  DAY: SUNDAY  1.  Drink clear liquids the entire day-NO SOLID FOOD  2.  Do not drink anything colored red or purple.  Avoid juices with pulp.  No orange juice.  3.  Drink at least 64 oz. (8 glasses) of fluid/clear liquids during the day to prevent dehydration and help the prep work efficiently.  CLEAR LIQUIDS INCLUDE: Water Jello Ice Popsicles Tea (sugar ok, no milk/cream) Powdered fruit flavored drinks Coffee (sugar ok, no milk/cream) Gatorade Juice: apple, white grape, white cranberry  Lemonade Clear bullion, consomm, broth Carbonated beverages (any kind) Strained chicken noodle soup Hard Candy                             4.  In the morning, mix first dose of MoviPrep solution:    Empty 1 Pouch A and 1 Pouch B into the disposable container    Add lukewarm drinking water to the top line of the container. Mix to dissolve    Refrigerate (mixed solution should be used within 24 hrs)  5.  Begin drinking the prep at 5:00 p.m. The MoviPrep container is divided by 4 marks.   Every 15 minutes drink the solution down to the next mark (approximately 8 oz) until the full liter is complete.   6.  Follow completed prep with 16 oz of clear liquid of your choice (Nothing  red or purple).  Continue to drink clear liquids until bedtime.  7.  Before going to bed, mix second dose of MoviPrep solution:    Empty 1 Pouch A and 1 Pouch B into the disposable container    Add lukewarm drinking water to the top line of the container. Mix to dissolve    Refrigerate  THE DAY OF YOUR PROCEDURE      DATE: 05/05/09  DAY: MONDAY  Beginning at 4:30AM (5 hours before procedure):         1. Every 15 minutes, drink the solution down to the next mark (approx 8 oz) until the full liter is complete.  2. Follow completed prep with 16 oz. of clear liquid of your choice.    3. You may drink clear liquids until 7:30AM (2 HOURS BEFORE PROCEDURE).   MEDICATION INSTRUCTIONS  Unless otherwise instructed, you should take regular prescription medications with a small sip of water   as early as possible the morning  of your procedure.   Additional medication instructions: n/a         OTHER INSTRUCTIONS  You will need a responsible adult at least 53 years of age to accompany you and drive you home.   This person must remain in the waiting room during your procedure.  Wear loose fitting clothing that is easily removed.  Leave jewelry and other valuables at home.  However, you may wish to bring a book to read or  an iPod/MP3 player to listen to music as you wait for your procedure to start.  Remove all body piercing jewelry and leave at home.  Total time from sign-in until discharge is approximately 2-3 hours.  You should go home directly after your procedure and rest.  You can resume normal activities the  day after your procedure.  The day of your procedure you should not:   Drive   Make legal decisions   Operate machinery   Drink alcohol   Return to work  You will receive specific instructions about eating, activities and medications before you leave.    The above instructions have been reviewed and explained to me by   Sherren Kerns RN  April 21, 2009 4:17  PM    I fully understand and can verbalize these instructions _____________________________ Date _________

## 2010-02-03 NOTE — Procedures (Signed)
Summary: Colonoscopy  Patient: [name] Note: All result statuses are Final unless otherwise noted.  Tests: (1) Colonoscopy (COL)   COL Colonoscopy           DONE     [facility]     [address]     [place], [place]  [address]           COLONOSCOPY PROCEDURE REPORT           PATIENT:  Mia [name], Mia [name]  MR#:  [id number]     BIRTHDATE:  03/14/1955, 53 yrs. old  GENDER:  female     ENDOSCOPIST:  Rachael Fee, MD     REF. BY:  Candice Camp, M.D.     PROCEDURE DATE:  05/05/2009     PROCEDURE:  Colonoscopy with snare polypectomy     ASA CLASS:  Class II     INDICATIONS:  Routine Risk Screening     MEDICATIONS:   Fentanyl 50 mcg IV, Versed 6 mg IV     DESCRIPTION OF PROCEDURE:   After the risks benefits and     alternatives of the procedure were thoroughly explained, informed     consent was obtained.  Digital rectal exam was performed and     revealed no rectal masses.   The LB CF-[id number] endoscope     was introduced through the anus and advanced to the cecum, which     was identified by both the appendix and ileocecal valve, without     limitations.  The quality of the prep was excellent, using     MoviPrep.  The instrument was then slowly withdrawn as the colon     was fully examined.     <<PROCEDUREIMAGES>>     FINDINGS:  A sessile polyp was found. This was 3mm across, located     in cecum, removed with cold snare and sent to pathology (jar 1)     (see image3).  Mild diverticulosis was found in the sigmoid to     descending colon segments (see image1).  This was otherwise a     normal examination of the colon (see image2, image4, and image5).     Retroflexed views in the rectum revealed no abnormalities.    The     scope was then withdrawn from the patient and the procedure     completed.     COMPLICATIONS:  None     ENDOSCOPIC IMPRESSION:     1) Small sessile polyp, removed and sent to pathology     2) Mild diverticulosis in the sigmoid to  descending colon     segments     3) Otherwise normal examination           RECOMMENDATIONS:     1) If the polyp(s) removed today are proven to be adenomatous     (pre-cancerous) polyps, you will need a repeat colonoscopy in 5     years. Otherwise you should continue to follow colorectal cancer     screening guidelines for "routine risk" patients with colonoscopy     in 10 years.     2) You will receive a letter within 1-2 weeks with the results     of your biopsy as well as final recommendations. Please call my     office if you have not received a letter after 3 weeks.           ______________________________     Rachael Fee, MD  n.     eSIGNED:   Rachael Fee at 05/05/2009 09:45 AM           Tommy Medal, [phone]  Note: An exclamation mark (!) indicates a result that was not dispersed into the flowsheet. Document Creation Date: 05/05/2009 9:46 AM _______________________________________________________________________  (1) Order result status: Final Collection or observation date-time: 05/05/2009 09:41 Requested date-time:  Receipt date-time:  Reported date-time:  Referring Physician:   Ordering Physician: Rob Bunting [phone]) Specimen Source:  Source: Launa Grill Order Number: [id number] Lab site:   Appended Document: Colonoscopy     Procedures Next Due Date:    Colonoscopy: 05/2019

## 2010-02-03 NOTE — Progress Notes (Signed)
Summary: Cannot afford prep  Phone Note Call from Patient Call back at Home Phone [phone]   Caller: Patient Call For: Dr. Christella Hartigan Summary of Call: Patient cannot afford prep. Please advise. Initial call taken by: Zackery Barefoot,  April 28, 2009 10:05 AM  Follow-up for Phone Call        sample left at front desk.  pt to p/u. Follow-up by: Chales Abrahams CMA Duncan Dull),  April 28, 2009 10:28 AM

## 2010-02-05 ENCOUNTER — Encounter: Payer: Self-pay | Admitting: Infectious Disease

## 2010-02-06 ENCOUNTER — Emergency Department (HOSPITAL_COMMUNITY)
Admission: EM | Admit: 2010-02-06 | Discharge: 2010-02-06 | Disposition: A | Discharge status: Home / Self Care | Payer: Worker's Compensation | Attending: Emergency Medicine | Admitting: Emergency Medicine

## 2010-02-06 ENCOUNTER — Emergency Department (HOSPITAL_COMMUNITY): Payer: Worker's Compensation

## 2010-02-06 DIAGNOSIS — R1110 Vomiting, unspecified: Secondary | ICD-10-CM | POA: Insufficient documentation

## 2010-02-06 DIAGNOSIS — R5381 Other malaise: Secondary | ICD-10-CM | POA: Insufficient documentation

## 2010-02-06 DIAGNOSIS — I10 Essential (primary) hypertension: Secondary | ICD-10-CM | POA: Insufficient documentation

## 2010-02-06 DIAGNOSIS — R109 Unspecified abdominal pain: Secondary | ICD-10-CM | POA: Insufficient documentation

## 2010-02-06 DIAGNOSIS — E669 Obesity, unspecified: Secondary | ICD-10-CM | POA: Insufficient documentation

## 2010-02-06 DIAGNOSIS — R259 Unspecified abnormal involuntary movements: Secondary | ICD-10-CM | POA: Insufficient documentation

## 2010-02-06 LAB — URINALYSIS, ROUTINE W REFLEX MICROSCOPIC
Hgb urine dipstick: NEGATIVE
Specific Gravity, Urine: 1.025 (ref 1.005–1.030)
Urine Glucose, Fasting: NEGATIVE mg/dL
pH: 5.5 (ref 5.0–8.0)

## 2010-02-06 LAB — URINE MICROSCOPIC-ADD ON

## 2010-02-06 LAB — DIFFERENTIAL
Basophils Absolute: 0 10*3/uL (ref 0.0–0.1)
Basophils Relative: 0 % (ref 0–1)
Eosinophils Absolute: 0 10*3/uL (ref 0.0–0.7)
Monocytes Absolute: 0.6 10*3/uL (ref 0.1–1.0)
Neutro Abs: 6.3 10*3/uL (ref 1.7–7.7)
Neutrophils Relative %: 77 % (ref 43–77)

## 2010-02-06 LAB — COMPREHENSIVE METABOLIC PANEL
ALT: 27 U/L (ref 0–35)
AST: 38 U/L — ABNORMAL HIGH (ref 0–37)
Albumin: 2.8 g/dL — ABNORMAL LOW (ref 3.5–5.2)
CO2: 25 mEq/L (ref 19–32)
Calcium: 8.5 mg/dL (ref 8.4–10.5)
GFR calc Af Amer: >60 mL/min (ref >60)
GFR calc non Af Amer: >60 mL/min (ref >60)
Sodium: 136 mEq/L (ref 135–145)
Total Protein: 6.5 g/dL (ref 6.0–8.3)

## 2010-02-06 LAB — CBC
Hemoglobin: 11 g/dL — ABNORMAL LOW (ref 12.0–15.0)
MCHC: 32.2 g/dL (ref 30.0–36.0)
Platelets: 163 10*3/uL (ref 150–400)
RBC: 4.21 MIL/uL (ref 3.87–5.11)

## 2010-02-07 ENCOUNTER — Emergency Department (HOSPITAL_COMMUNITY): Payer: Worker's Compensation

## 2010-02-07 ENCOUNTER — Encounter: Payer: Self-pay | Admitting: Internal Medicine

## 2010-02-07 ENCOUNTER — Inpatient Hospital Stay (HOSPITAL_COMMUNITY)
Admission: EM | Admit: 2010-02-07 | Discharge: 2010-02-10 | DRG: 872 | Disposition: A | Discharge status: Home Health | Payer: Worker's Compensation | Attending: Internal Medicine | Admitting: Internal Medicine

## 2010-02-07 ENCOUNTER — Encounter (HOSPITAL_COMMUNITY): Payer: Self-pay

## 2010-02-07 DIAGNOSIS — Z6841 Body Mass Index (BMI) 40.0 and over, adult: Secondary | ICD-10-CM

## 2010-02-07 DIAGNOSIS — E669 Obesity, unspecified: Secondary | ICD-10-CM | POA: Diagnosis present

## 2010-02-07 DIAGNOSIS — Z96659 Presence of unspecified artificial knee joint: Secondary | ICD-10-CM

## 2010-02-07 DIAGNOSIS — R7881 Bacteremia: Secondary | ICD-10-CM

## 2010-02-07 DIAGNOSIS — I10 Essential (primary) hypertension: Secondary | ICD-10-CM | POA: Diagnosis present

## 2010-02-07 DIAGNOSIS — D649 Anemia, unspecified: Secondary | ICD-10-CM | POA: Diagnosis present

## 2010-02-07 DIAGNOSIS — Z7901 Long term (current) use of anticoagulants: Secondary | ICD-10-CM

## 2010-02-07 DIAGNOSIS — B961 Klebsiella pneumoniae [K. pneumoniae] as the cause of diseases classified elsewhere: Secondary | ICD-10-CM | POA: Diagnosis present

## 2010-02-07 DIAGNOSIS — E785 Hyperlipidemia, unspecified: Secondary | ICD-10-CM | POA: Diagnosis present

## 2010-02-07 DIAGNOSIS — E876 Hypokalemia: Secondary | ICD-10-CM | POA: Diagnosis not present

## 2010-02-07 DIAGNOSIS — B999 Unspecified infectious disease: Secondary | ICD-10-CM

## 2010-02-07 DIAGNOSIS — Z86718 Personal history of other venous thrombosis and embolism: Secondary | ICD-10-CM

## 2010-02-07 DIAGNOSIS — A498 Other bacterial infections of unspecified site: Secondary | ICD-10-CM | POA: Diagnosis present

## 2010-02-07 HISTORY — DX: Other specified health status: Z78.9

## 2010-02-07 HISTORY — DX: Morbid (severe) obesity due to excess calories: E66.01

## 2010-02-07 HISTORY — DX: Essential (primary) hypertension: I10

## 2010-02-07 LAB — BASIC METABOLIC PANEL
BUN: 10 mg/dL (ref 6–23)
Creatinine, Ser: 0.74 mg/dL (ref 0.4–1.2)
GFR calc Af Amer: >60 mL/min (ref >60)
GFR calc non Af Amer: >60 mL/min (ref >60)

## 2010-02-07 LAB — DIFFERENTIAL
Basophils Absolute: 0 10*3/uL (ref 0.0–0.1)
Eosinophils Absolute: 0.1 10*3/uL (ref 0.0–0.7)
Eosinophils Relative: 2 % (ref 0–5)
Lymphocytes Relative: 32 % (ref 12–46)
Lymphs Abs: 1.9 10*3/uL (ref 0.7–4.0)
Monocytes Absolute: 0.7 10*3/uL (ref 0.1–1.0)

## 2010-02-07 LAB — CBC
MCH: 26.5 pg (ref 26.0–34.0)
MCV: 81.3 fL (ref 78.0–100.0)
Platelets: 147 10*3/uL — ABNORMAL LOW (ref 150–400)
RDW: 16.2 % — ABNORMAL HIGH (ref 11.5–15.5)
WBC: 6 10*3/uL (ref 4.0–10.5)

## 2010-02-07 LAB — URINALYSIS, ROUTINE W REFLEX MICROSCOPIC
Bilirubin Urine: NEGATIVE
Ketones, ur: NEGATIVE mg/dL
Nitrite: NEGATIVE
Protein, ur: NEGATIVE mg/dL
pH: 6 (ref 5.0–8.0)

## 2010-02-07 LAB — PROTIME-INR: Prothrombin Time: 19 seconds — ABNORMAL HIGH (ref 11.6–15.2)

## 2010-02-08 LAB — BASIC METABOLIC PANEL
CO2: 25 mEq/L (ref 19–32)
Chloride: 106 mEq/L (ref 96–112)
GFR calc Af Amer: >60 mL/min (ref >60)
Potassium: 3.7 mEq/L (ref 3.5–5.1)
Sodium: 142 mEq/L (ref 135–145)

## 2010-02-08 LAB — CBC
HCT: 33.9 % — ABNORMAL LOW (ref 36.0–46.0)
MCH: 26 pg (ref 26.0–34.0)
MCHC: 31.6 g/dL (ref 30.0–36.0)
MCV: 82.5 fL (ref 78.0–100.0)
Platelets: 177 10*3/uL (ref 150–400)

## 2010-02-08 LAB — IRON AND TIBC
Iron: 37 ug/dL — ABNORMAL LOW (ref 42–135)
Saturation Ratios: 15 % — ABNORMAL LOW (ref 20–55)
UIBC: 206 ug/dL

## 2010-02-08 LAB — LIPID PANEL
LDL Cholesterol: 99 mg/dL (ref 0–99)
Triglycerides: 243 mg/dL — ABNORMAL HIGH (ref <150)

## 2010-02-08 LAB — VITAMIN B12: Vitamin B-12: 383 pg/mL (ref 211–911)

## 2010-02-08 LAB — MAGNESIUM: Magnesium: 2 mg/dL (ref 1.5–2.5)

## 2010-02-08 LAB — HEMOCCULT GUIAC POC 1CARD (OFFICE): Fecal Occult Bld: NEGATIVE

## 2010-02-08 LAB — PROTIME-INR
INR: 1.73 — ABNORMAL HIGH (ref 0.00–1.49)
Prothrombin Time: 20.4 seconds — ABNORMAL HIGH (ref 11.6–15.2)

## 2010-02-09 ENCOUNTER — Inpatient Hospital Stay (HOSPITAL_COMMUNITY): Payer: Worker's Compensation

## 2010-02-09 ENCOUNTER — Encounter: Payer: Self-pay | Admitting: Infectious Disease

## 2010-02-09 LAB — URINE CULTURE: Colony Count: 45000

## 2010-02-09 LAB — PROTIME-INR
INR: 2.49 — ABNORMAL HIGH (ref 0.00–1.49)
Prothrombin Time: 27 seconds — ABNORMAL HIGH (ref 11.6–15.2)

## 2010-02-09 LAB — CULTURE, BLOOD (ROUTINE X 2): Culture  Setup Time: 201202032335

## 2010-02-09 LAB — BASIC METABOLIC PANEL
BUN: 6 mg/dL (ref 6–23)
Calcium: 9 mg/dL (ref 8.4–10.5)
Creatinine, Ser: 0.68 mg/dL (ref 0.4–1.2)
GFR calc non Af Amer: >60 mL/min (ref >60)
Glucose, Bld: 104 mg/dL — ABNORMAL HIGH (ref 70–99)
Potassium: 3.5 mEq/L (ref 3.5–5.1)

## 2010-02-09 LAB — CK: Total CK: 37 U/L (ref 7–177)

## 2010-02-10 DIAGNOSIS — R7881 Bacteremia: Secondary | ICD-10-CM

## 2010-02-10 LAB — CBC
HCT: 34.7 % — ABNORMAL LOW (ref 36.0–46.0)
Hemoglobin: 11 g/dL — ABNORMAL LOW (ref 12.0–15.0)
MCV: 80.5 fL (ref 78.0–100.0)
RBC: 4.31 MIL/uL (ref 3.87–5.11)
WBC: 6.5 10*3/uL (ref 4.0–10.5)

## 2010-02-10 LAB — BASIC METABOLIC PANEL
CO2: 28 mEq/L (ref 19–32)
Calcium: 8.9 mg/dL (ref 8.4–10.5)
Chloride: 105 mEq/L (ref 96–112)
GFR calc Af Amer: >60 mL/min (ref >60)
Potassium: 3.5 mEq/L (ref 3.5–5.1)
Sodium: 140 mEq/L (ref 135–145)

## 2010-02-10 LAB — CULTURE, BLOOD (ROUTINE X 2)

## 2010-02-11 NOTE — Initial Assessments (Signed)
INTERNAL MEDICINE ADMISSION HISTORY AND PHYSICAL  PCP:  Dr. Pearson [facility])  CC:  chills and weakness  HPI:  Patient is a 55 y/o woman with PMH total right knee replacement on Nov 15, 2009 which followed a complicated course.  The patient is currently receiving IV daptomycin since January 12 through a PICC line the patient believes was placed during her admission in late December/early January.  Pt began having subjective fevers, chills, nausea and vomiting, and feeling of weakness with dizziness and headache 3 days prior to admission and presented to the ED yesterday, the day prior to admission, for evaluation.  The ED physician spoke with Dr. Daiva Eves who requested that labs, including blood cultures be drawn.  The patient's symptoms responded well to IVF.  The patient was afebrile and VSS, did not have any leukocytosis, and felt well for discharge home and follow-up as scheduled in the ID clinic on Feb 13.  She continues to feel well.  She came to the ED today for admission at the request of Dr. Daiva Eves because one of the two blood cultures done in the ED yesterday grew out gram negative rods.   The patient denies any chest pain, shortness of breath, cough, sick contacts.  She denies abdominal pain, dysuria, urinary frequency but does endorse some foul odor to her urine for the past few days and right sided back pain which she says is new for the past few days.  She endorses compliance with her medicines except her lasix which she takes for LE swelling and which she stopped recently because she felt like she was having to urinate too frequently.    ED: 1L NS bolus  ALLERGIES: Zosyn -  unsure, pt believes hives and blisters Cefepime - unsure, pt believes hives and blisters  PAST MEDICAL HISTORY: HTN HLP IBS morbid obesity - BMI 52 h/o bilateral PE, DVT - Nov 2011 related to right knee surgery which was complicated by right lower extremity DVT which caused bilateral  PEs; Jan 2012 related to PICC line placement pt had right upper extremity DVT which caused bilateral PEs migranes h/o sessile, hyperplastic colon polyp without sign of malignancy removed during colonoscopy in May 2011 diverticulosis on ECHO in Jan 2012 - EF 55-60% and mildly increased PA peak pressure  PAST SURGICAL HISTORY: - cholecystectomy.  - Tubal ligation.  - History of abnormal uterine bleeding with hysteroscopy Oct 2008 which showed fibroids.  - removal of cyst of the right wrist - Left knee arthroscopy, 2000. - Right knee arthroscopy July 26, 2008 with partial lateral meniscectomy, chondroplasty to bleeding bone, and three compartment including medial femoral condyle, lateral femoral condyle, patellofemoral joint, aspiration of Baker's cyst.  - Right total knee arthroplasty on Nov 15, 2009 for treatment of end-stage osteoarthritis which has had a complicated course.  First the surgery was complicated by wound dehiscence which was addressed with I+D on Dec 26, 2009. Cultures taken were negative.  Pt then was readmitted on Jan 03, 2010 - January 20, 2010.  During this admission she underwent repeat I and D of a right total knee arthroplasty Dr. Ranell Patrick on Jan 05, 2010.  Also during this admission she was treated with various antibiotics including IV vancomycin, ciprofloxacin, rifampin, zosyn (she developed an allergy to this), cefepime (also developed an allergy), and finally daptomycin (started Jan 12).  All cultures were negative.   She was discharged on Daptomycin IV to be continued until f/u at the ID clinic  with Dr. Daiva Eves on Feb 16, 2010.  MEDICATIONS: Daptomycin 800 mg IV daily.  Zetia 10 mg q.h.s.  Lasix 40 mg daily. - pt endorses stopping this recently Hydrochlorothiazide 25 mg daily.  Robaxin 500 mg p.o. q.i.d.  Lopressor 25 mg b.i.d.  Benicar 40 mg daily.  Zocor 20 mg daily.  Potassium chloride 20 mEq b.i.d.  Percocet 5/325 one to two tabs q.4-6 h. p.r.n. pain.    Coumadin 5mg  by mouth daily  SOCIAL HISTORY: Patient lives with her 67 year old son and her husband. 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  She has never smoked, never drank alcohol, never used drugs.  FAMILY HISTORY CAD, colon cancer  ROS: Otherwise negative, as per HPI  VITALS: T:97.9  P:87  BP:121/79  R:19  O2SAT:97%  ON: RA  PHYSICAL EXAM: Gen: Patient is in NAD, Pleasant, morbidly obese Eyes: PERRL, EOMI, No signs of anemia or jaundince. ENT: MMM, OP clear, No erythema, thrush or exudates. Neck: Supple Resp: CTA- Bilaterally, No Wheezes CVS: RRR, No murmurs, 2+ bilateral pedal pulses GI: Abdomen is obese, soft, non-tender.   Ext: 1+ pitting edema bilaterally to mid-shin.  Pt has 20cm well-healed midline scar to anterior left knee; that knee is tender to palpation both medially and lateral and with passive and active movement.  There is no warmth or erythema.  Right knee is non-tender. GU: No CVA tenderness. Skin: No visible rashes, scars.  Pt has PICC line placed in left upper extremity covered in clean dry dressing without evidence drainage; the area around the insertion of the PICC line is not erythematous, warm, or tender to palpation. Lymph: No palpable cervical or axillary lymphadenopathy. MS: Moving all 4 extremities. Neuro: A&O X3, CN II - XII are grossly intact. Motor strength is 5/5 in bilateral upper extremities, 5/5 left lower extremity, 4/5 in right lower extremity (chronic and improving since her surgery per the patient, limited by pain), Sensations intact to light touch, Gait normal Psych: Appropriate   LABS: February 06, 2010  WBC                                      8.1               4.0-10.5         K/uL  RBC                                      4.21              3.87-5.11        MIL/uL  Hemoglobin (HGB)                         11.0       l      12.0-15.0        g/dL  Hematocrit (HCT)                         34.2        l      36.0-46.0        %  MCV  81.2              78.0-100.0       fL  MCH -                                    26.1              26.0-34.0        pg  MCHC                                     32.2              30.0-36.0        g/dL  RDW                                      16.2       h      11.5-15.5        %  Platelet Count (PLT)                     163               150-400          K/uL  Neutrophils, %                           77                43-77            %  Lymphocytes, %                           15                12-46            %  Monocytes, %                             7                 3-12             %  Eosinophils, %                           0                 0-5              %  Basophils, %                             0                 0-1              %  Neutrophils, Absolute                    6.3               1.7-7.7  K/uL  Lymphocytes, Absolute                    1.2               0.7-4.0          K/uL  Monocytes, Absolute                      0.6               0.1-1.0          K/uL  Eosinophils, Absolute                    0.0               0.0-0.7          K/uL  Basophils, Absolute                      0.0               0.0-0.1          K/uL   Sodium (NA)                              136               135-145          mEq/L  Potassium (K)                            3.2        l      3.5-5.1          mEq/L  Chloride                                 103               96-112           mEq/L  CO2                                      25                19-32            mEq/L  Glucose                                  95                70-99            mg/dL  BUN                                      11                6-23             mg/dL  Creatinine  0.74              0.4-1.2          mg/dL  GFR, Est Non African American            >60               >60              mL/min  GFR, Est African  American                >60               >60              mL/min    Oversized comment, see footnote  1  Bilirubin, Total                         0.6               0.3-1.2          mg/dL  Alkaline Phosphatase                     70                39-117           U/L  SGOT (AST)                               38         h      0-37             U/L  SGPT (ALT)                               27                0-35             U/L  Total  Protein                           6.5               6.0-8.3          g/dL  Albumin-Blood                            2.8        l      3.5-5.2          g/dL  Calcium                                  8.5               8.4-10.5         mg/dL   Lipase                                   24                11-59            U/L   Color,  Urine                             AMBER      a      YELLOW    BIOCHEMICALS MAY BE AFFECTED BY COLOR  Appearance                               CLOUDY     a      CLEAR  Specific Gravity                         1.025             1.005-1.030  pH                                       5.5               5.0-8.0  Urine Glucose                            NEGATIVE          NEG              mg/dL  Bilirubin                                SMALL      a      NEG  Ketones                                  NEGATIVE          NEG              mg/dL  Blood                                    NEGATIVE          NEG  Protein                                  30         a      NEG              mg/dL  Urobilinogen                             0.2               0.0-1.0          mg/dL  Nitrite                                  NEGATIVE          NEG  Leukocytes  NEGATIVE          NEG  Squamous Epithelial / LPF                MANY       a      RARE  WBC / HPF                                0-2               <3               WBC/hpf  Bacteria / HPF                           RARE              RARE   SPECIMEN DESCRIPTION:         BLOOD                                 PICC LINE  SPECIAL REQUESTS:             BOTTLES DRAWN AEROBIC AND ANAEROBIC                                5CC  SET UP TIME:                  [id number]  CULTURE:                      GRAM NEGATIVE RODS                                Note:                                Gram Stain Report Called to,Read Back By and                                Verified With:                                Genene Churn RN @0403  ON 02/07/10 ADAIL                                Performed at [facility]  REPORT STATUS:                PRELIMINARY   SPECIMEN DESCRIPTION:         BLOOD                                LEFT                                HAND  SPECIAL REQUESTS:             BOTTLES DRAWN AEROBIC AND ANAEROBIC  5CC  SET UP TIME:                  [id number]  CULTURE:                      .                                BLOOD CULTURE RECEIVED                                NO GROWTH TO DATE                                CULTURE WILL BE HELD FOR 5 DAYS                                BEFORE ISSUING A FINAL NEGATIVE REPORT                                Performed at [facility]  REPORT STATUS:                PRELIMINARY  February 07, 2010  WBC                                      6.0               4.0-10.5         K/uL  RBC                                      4.22              3.87-5.11        MIL/uL  Hemoglobin (HGB)                         11.2       l      12.0-15.0        g/dL  Hematocrit (HCT)                         34.3       l      36.0-46.0        %  MCV                                      81.3              78.0-100.0       fL  MCH -                                    26.5              26.0-34.0        pg  MCHC  32.7              30.0-36.0        g/dL  RDW                                      16.2       h      11.5-15.5        %  Platelet Count (PLT)                     147         l      150-400          K/uL  Neutrophils, %                           54                43-77            %  Lymphocytes, %                           32                12-46            %  Monocytes, %                             11                3-12             %  Eosinophils, %                           2                 0-5              %  Basophils, %                             1                 0-1              %  Neutrophils, Absolute                    3.2               1.7-7.7          K/uL  Lymphocytes, Absolute                    1.9               0.7-4.0          K/uL  Monocytes, Absolute                      0.7               0.1-1.0          K/uL  Eosinophils, Absolute  0.1               0.0-0.7          K/uL  Basophils, Absolute                      0.0               0.0-0.1          K/uL   Sodium (NA)                              140               135-145          mEq/L  Potassium (K)                            3.3        l      3.5-5.1          mEq/L  Chloride                                 106               96-112           mEq/L  CO2                                      27                19-32            mEq/L  Glucose                                  124        h      70-99            mg/dL  BUN                                      10                6-23             mg/dL  Creatinine                               0.74              0.4-1.2          mg/dL  GFR, Est Non African American            >60               >60              mL/min  GFR, Est African American                >60               >60  mL/min    Oversized comment, see footnote  1  Calcium                                  8.6               8.4-10.5         mg/dL   Protime ( Prothrombin Time)              19.0       h      11.6-15.2        seconds  INR                                      1.57       h      0.00-1.49   Color, Urine                             YELLOW             YELLOW  Appearance                               CLOUDY     a      CLEAR  Specific Gravity                         1.018             1.005-1.030  pH                                       6.0               5.0-8.0  Urine Glucose                            NEGATIVE          NEG              mg/dL  Bilirubin                                NEGATIVE          NEG  Ketones                                  NEGATIVE          NEG              mg/dL  Blood                                    NEGATIVE          NEG  Protein                                  NEGATIVE  NEG              mg/dL  Urobilinogen                             0.2               0.0-1.0          mg/dL  Nitrite                                  NEGATIVE          NEG  Leukocytes                               NEGATIVE          NEG    MICROSCOPIC NOT DONE ON URINES WITH NEGATIVE PROTEIN, BLOOD, LEUKOCYTES,    NITRITE, OR GLUCOSE <1000 mg/dL.  IMAGING: ACUTE ABDOMEN SERIES (ABDOMEN 2 VIEW & CHEST 1 VIEW)   Comparison: [facility] acute abdominal radiographs   07/31/2008.   Findings: Left PICC line ends at cavoatrial junction without   pneumothorax.  Clear lungs with normal heart size.  No   pneumoperitoneum with normal bowel gas pattern and no significant   fecal burden visualized.  No interval visceromegaly or abnormal   calcifications seen.    IMPRESSION:   1.  Left PICC line ends at cavoatrial junction without   pneumothorax.   2.  No acute findings.   CHEST - 2 VIEW   Comparison: CT 01/09/2010.  Radiographs 01/08/2010.   Findings: Left arm PICC appears unchanged in the upper right   atrium.  The heart size and mediastinal contours are stable.  The   lungs are clear.  There is no pleural effusion or pneumothorax.    IMPRESSION:   No acute cardiopulmonary process.  PICC line appears unchanged with   the tip in the upper right atrium.  ASSESSMENT AND PLAN: (1) bacteremia: Pt is afebrile without  leukocytosis.  No obvious source of patient's infection on physical exam, CXR without infiltrate, however patient has been receiving IV antibiotics (most recently daptomycin) through PICC line since early January.  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   - BCx x2 - UA, UCx - 2 view right knee x-ray - change PICC line - meropenem   () Anemia:  low-normocytic without signs of bleeding.   - anemia panel - stool guiac x3, stop with first negative  ()hypokalemia:  possibly related to patient's home lasix use but she states she stopped taking this recently - KCl by mouth x1 - recheck AM BMet, Magnesium  () HTN: normotensive - continue home ARB and beta blocker  () HLP:  no FLP in records - check FLP - continue patient's home medicines  () h/o recurrent DVT/PE:  INR sub-therapeutic; per patient her INRs have been "all over the place".  - coumadin per pharmacy - lovenox 40mg  Subcutaneously daily until INR therapeutic  ()VTE PROPH: see above    ATTENDING:  ____________________________________

## 2010-02-13 LAB — CULTURE, BLOOD (ROUTINE X 2)
Culture  Setup Time: 201202042043
Culture  Setup Time: 201202042043
Culture: NO GROWTH

## 2010-02-16 ENCOUNTER — Encounter: Payer: Self-pay | Admitting: Infectious Disease

## 2010-02-16 ENCOUNTER — Telehealth (INDEPENDENT_AMBULATORY_CARE_PROVIDER_SITE_OTHER): Payer: Self-pay | Admitting: *Deleted

## 2010-02-16 ENCOUNTER — Telehealth: Payer: Self-pay | Admitting: Infectious Disease

## 2010-02-16 ENCOUNTER — Ambulatory Visit (INDEPENDENT_AMBULATORY_CARE_PROVIDER_SITE_OTHER): Payer: BC Managed Care – PPO | Admitting: Infectious Disease

## 2010-02-16 DIAGNOSIS — T8489XA Other specified complication of internal orthopedic prosthetic devices, implants and grafts, initial encounter: Secondary | ICD-10-CM

## 2010-02-16 DIAGNOSIS — R7881 Bacteremia: Secondary | ICD-10-CM | POA: Insufficient documentation

## 2010-02-16 DIAGNOSIS — A4902 Methicillin resistant Staphylococcus aureus infection, unspecified site: Secondary | ICD-10-CM | POA: Insufficient documentation

## 2010-02-16 DIAGNOSIS — B961 Klebsiella pneumoniae [K. pneumoniae] as the cause of diseases classified elsewhere: Secondary | ICD-10-CM | POA: Insufficient documentation

## 2010-02-21 NOTE — Discharge Summary (Signed)
Mia [name], Mia [name]            ACCOUNT NO.:  [id number]  MEDICAL RECORD NO.:  [id number]           PATIENT TYPE:  I  LOCATION:  5504                         FACILITY:  [facility]  PHYSICIAN:  Doneen Poisson, MD     DATE OF BIRTH:  07/15/1955  DATE OF ADMISSION:  02/07/2010 DATE OF DISCHARGE:  02/10/2010                              DISCHARGE SUMMARY   DISCHARGE DIAGNOSES: 1. Bacteremia with Klebsiella. 2. Anemia. 3. Hypertension. 4. Hyperlipidemia. 5. History of recurrent deep venous thrombosis/pulmonary embolism     provoked by instrumentation/surgery. 6. Irritable bowel syndrome. 7. Morbid obesity with body mass index of 52. 8. 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 9. History of left knee arthroscopy in 2000. 10.History of tubal ligation. 11.History of cholecystectomy.  DISCHARGE MEDICATIONS: 1. Tylenol 650 mg by mouth every 6 hours as needed for pain. 2. Ciprofloxacin 500 mg by mouth twice daily for 10 days. 3. Robaxin 500 mg by mouth every 6 hours as needed for muscle spasm. 4. Metoprolol 25 mg by mouth twice daily. 5. MiraLax 17 g by mouth daily as needed for constipation. 6. Daptomycin 800 mg IV daily at 5 p.m. take for 7 days until you     follow up with your doctor, Dr. Daiva Eves. 7. Hydrochlorothiazide 25 mg by mouth daily. 8. Micardis 80 mg by mouth daily. 9. Percocet 5/325 two tablets by mouth every 4 hours as needed for     pain. 10.Pantoprazole 40 mg by mouth daily at bedtime. 11.Pravastatin 40 mg by mouth daily at bedtime. 12.Coumadin 10 mg take 1/2 tablet by mouth daily. 13.Zetia 10 mg by mouth daily at bedtime.  DISPOSITION AND FOLLOWUP:  Mia [name] was discharged from the [facility] on February 10, 2010, in stable and improved condition.  At  no point during the hospitalization did she have a fever or hypotension.   She was sent home to complete a 14-day course of antibiotics for Klebsiella  bacteremia.  She is also to continue taking IV daptomycin for her  chronically infected right knee.  She is to follow up with Dr. Daiva Eves  at [facility] on February 16, 2010, at 10:45 in the  morning.  At that time, it will be decided whether or not she should  continue the IV antibiotics.  She is also to return to see Dr. Ranell Patrick of  [facility] on February 23, 2010, at 3 p.m.  CONSULTATIONS:  None.  PROCEDURES PERFORMED: 1. Two-view chest x-ray on February 07, 2010, showing no acute     cardiopulmonary process.  PICC line appears unchanged with the tip     in the right upper atrium.  2. One-to-two-view right knee on February 07, 2010, showing stable     appearance of the right knee.  No evidence for dislocation or     periprosthetic fracture.  3. PICC line placement with ultrasound and fluoroscopic  guidance on     February 09, 2010.ADMISSION HISTORY:  Mia [name] is a 55 year old woman with a past medical history of total right knee replacement on November 15, 2009, which followed a complicated course.  This was done for treatment of end-stage osteoarthritis.  First, the surgery was complicated by wound dehiscence which was addressed with incision and drainage on December 26, 2009. Cultures taken at that time were negative.  She was then readmitted from  January 03, 2010, to January 20, 2010.  During this admission, she  underwent repeat incision and drainage of her right total knee  arthroplasty by Dr. Ranell Patrick on January 05, 2010.  Also during this admission, she was treated with various antibiotics including IV vancomycin, ciprofloxacin, rifampin, ZOSYN (she developed an allergy to this), CEFEPIME (also developed an allergy), and finally daptomycin which was started on January 15, 2010.  All cultures taken  during this admission were negative.  She was discharged on daptomycin IV to be continued until follow up in the ID Clinic with Dr. Daiva Eves on February 16, 2010.  She has been receiving IV daptomycin through a PICC line that was placed during her admission in late December/early January.  She began having subjective fevers, chills, nausea and vomiting, and a feeling of weakness with dizziness and headache 3 days prior to admission.  She presented to the emergency department the day prior to admission for evaluation.  The ED doctor spoke with Dr. Daiva Eves who requested that labs including blood cultures be drawn.  Her symptoms responded well to IV fluids in the emergency department and because she was afebrile and had no leukocytosis, felt well for discharge home.  On the day of admission,  she continues to do well.  She came to the emergency department today at  the request of Dr. Daiva Eves because one of the two blood cultures done in  the emergency department yesterday grew out Gram-negative rods.  She  denies any chest pain, shortness of breath, cough, sick contacts,  abdominal pain, dysuria, or urinary frequency, but does endorse some  foul odor to her urine for the past few days and right-sided back pain  which she says is new over the past few days.  She endorses compliance  with her medicines except for Lasix which she takes for lower extremity  swelling and stopped recently because she thought like she was having to  urinate too frequently.  PHYSICAL EXAMINATION: ADMISSION VITAL SIGNS:  Temperature 97.9, pulse 87, blood pressure 121/79,  respiratory rate 19, O2 Sat 97% on room air. GENERAL:  No acute distress, pleasant, morbidly obese. HEENT:  Eyes; pupils are equal, round, and reactive to light. Extraocular movements intact.  No signs of anemia or jaundice.  ENT, mucous membranes are moist.  Oropharynx is clear.  No erythema, thrush, or exudates. NECK:  Supple. RESPIRATORY:   Clear to auscultation bilaterally without wheezes. CARDIOVASCULAR:  Regular rate and rhythm.  No murmurs, 2+ bilateral pulses. GI:  Abdomen is obese, soft, nontender. EXTREMITIES:  1+ pitting edema bilaterally to midshin.  She has a 20-cm well-healed midline scar to the anterior right knee.  That knee is tender to palpation both medially and laterally with passive and active movement.  There is no warmth or erythema.  The left knee is nontender. GENITOURINARY:  There is no CVA tenderness. SKIN:  There are no other visible scars or rashes.  She has a PICC line placed in the left upper extremity covered in clean dry  dressing without evidence of drainage.  The area around the insertion of the PICC line is not erythematous, warm, or tender to palpation. LYMPH NODE:  No palpable cervical or axillary lymphadenopathy. MUSCULOSKELETAL:  Moving all 4 extremities. NEUROLOGIC:  Alert and oriented x 3.  Cranial nerves II through XII grossly intact.  Motor strength 5/5 in bilateral upper extremities, 5/5 in the left lower extremity, 4+/5 in the right lower extremity which is chronic and improving since her surgery per the patient.  Some of this exam is limited by pain in that extremity.  Sensation is intact to light touch. PSYCHIATRIC:  Appropriate.  ADMISSION LABORATORY:  Blood cultures from February 06, 2010, first blood culture showed Gram-negative rods, next blood culture showed no growth to date.  White blood cell count 6.0, hemoglobin 11.2, hematocrit 34.3,  platelets 147.  Sodium 140, potassium 3.3, chloride 106, bicarb 27, BUN  10, creatinine 0.74, glucose 124, calcium 8.6.  PT 19, INR 1.57.   Urinalysis negative.  HOSPITAL COURSE BY PROBLEM: 1. Bacteremia.  On admission, Ms. Moustafa was afebrile without leukocytosis      and felt well.  She continued to be afebrile and feel well during the      admission and on discharge.  The source of her bacteremia was thought      to be twofold.  The  first source was that of her PICC line (which was      removed on the day of admission - note that repeat blood cultures      drawn before the PICC line was replaced and antibiotics started).       The second source was mildly symptomatic bacteruria.  She was started      on meropenem IV while we awaited identification and sensitivities of      the blood cultures drawn the day prior to admission.  We continued the      daptomycin that she was receiving as an outpatient for her right knee      infection.  The blood culture ultimately grew out 2 organisms. The      first was Klebsiella pneumoniae which was resistant to ampicillin with      intermittent resistance to ampicillin/sulbactam and cefoxitin, but      sensitive to cefazolin, cefepime, ceftazidime, ceftriaxone,      ciprofloxacin, gentamicin, imipenem, tobramycin, Bactrim.  The second      was was E. coli that was resistant to ampicillin, but sensitive to      other antibiotics including ciprofloxacin.  She was discharged home      with a new PICC line that was placed on February 09, 2010.  She was      continued on daptomycin to complete the treatment previously scheduled     for her right knee infection and was also given oral Cipro to     complete a 14-day course of antibiotics for treatment of her     bacteremia.  She is to follow up with Dr. Daiva Eves in approximately     a week after discharge and at that time, he may decide to modify     her antibiotics including possibly stopping the daptomycin.  2. Anemia.  Ms. Padovano was without any signs of bleeding during this     admission, had fecal occult blood test which was negative x1 and     anemia panel which suggested anemia of chronic disease.  Anemia     panel showed iron 37,  total iron binding capacity 243, saturation      15%, vitamin B12 383, serum folate 10.5, and ferritin 190.  Her      hemoglobin on discharge was 11.0.  3. History of recurrent DVT/PE.  Ms. Levi had a  history of DVT and     subsequent pulmonary embolus with instrumentation in the past with     both her knee surgery and with her previous PICC line placement.     We continued her on Coumadin and initially had her on Lovenox as     well as her INR was subtherapeutic.  On discharge, her INR was 2.96     and she was to continue Coumadin as recommended by pharmacy and to     followed up with her Home Health nurse.  Her Coumadin will likely be     able to be discontinued once her PICC line has been removed.  4. Hypertension.  Ms. Obeid was continued on her home medicines and     remained normotensive during this admission.  5. Hyperlipidemia.  We continued her on the home statin and checked a      fasting lipid panel which showed her in good control with cholesterol      172, HDL 24, LDL 99.  Triglycerides were high at 243.  DISCHARGE VITALS:  Temperature 98.2, blood pressure 137/86, pulse 71, respirations 18, O2 Sat 97% on room air.  DISCHARGE LABORATORY:  White blood cell count 6.5, hemoglobin 11,  platelets 211.  Sodium 140, potassium 3.5, chloride 105, bicarb 28, BUN  7, creatinine 0.70, glucose 119.  PT 30.9, INR 2.96.  First blood  culture from February 06, 2010, grew out Klebsiella pneumoniae, please  see above for sensitivity information.  Second blood culture from February 06, 2010, grew out Klebsiella pneumoniae and E. coli, please see above for  sensitivity information.  Urine culture from February 07, 2010, showed  45,000 colonies of E. coli, see above for sensitivity information.   ______________________________ Danelle Berry, MD   ______________________________ Doneen Poisson, MD   JW/MEDQ  D:  02/11/2010  T:  02/11/2010  Job:  [id number]  cc:   Acey Lav, MD Almedia Balls Ranell Patrick, M.D.  Electronically Signed by Danelle Berry MD on 02/19/2010 08:53:15 AM Electronically Signed by Doneen Poisson  on 02/21/2010 08:17:10 PM

## 2010-02-23 ENCOUNTER — Ambulatory Visit (INDEPENDENT_AMBULATORY_CARE_PROVIDER_SITE_OTHER): Payer: BC Managed Care – PPO | Admitting: *Deleted

## 2010-02-23 DIAGNOSIS — I10 Essential (primary) hypertension: Secondary | ICD-10-CM

## 2010-02-23 DIAGNOSIS — Z7901 Long term (current) use of anticoagulants: Secondary | ICD-10-CM

## 2010-02-23 DIAGNOSIS — I2699 Other pulmonary embolism without acute cor pulmonale: Secondary | ICD-10-CM

## 2010-02-24 ENCOUNTER — Encounter: Payer: Self-pay | Admitting: Infectious Disease

## 2010-02-24 ENCOUNTER — Telehealth: Payer: Self-pay | Admitting: Infectious Disease

## 2010-02-25 NOTE — Assessment & Plan Note (Signed)
Summary: HFU KNEE INFECTION/PULL WL CHART/KDW   Vital Signs:  Patient profile:   55 year old female Height:      63 inches (160.02 cm) Weight:      325.12 pounds (147.78 kg) BMI:     57.80 Temp:     98.3 degrees F (36.83 degrees C) oral Pulse rate:   87 / minute BP sitting:   162 / 87  (right arm)  Vitals Entered By: Wendall Mola CMA [facility]) (February 16, 2010 10:27 AM) CC: new pt. right knee infection Is Patient Diabetic? No Pain Assessment Patient in pain? yes     Location: right knee Intensity: 6 Type: throbbing Onset of pain  Constant Nutritional Status BMI of > 30 = obese Nutritional Status Detail appetite "normal"  Have you ever been in a relationship where you felt threatened, hurt or afraid?Unable to ask   Does patient need assistance? Functional Status Self care Ambulation Impaired:Risk for fall Comments pt. walks with cane   Visit Type:  Follow-up Referring Provider:  Dr. Ranell Patrick  CC:  new pt. right knee infection.  History of Present Illness: 55 year old with recurrent prosthetic knee infection with MRSA sp repeat I and D in January with exchange of polyethylene and washout by Dr Ranell Patrick. She was being treated with daptomycin due to concern about a rxn to vancomycin. During her stay she developed allergic rash on zosyn and cefepime. She was on the daptomycin and developed a PICC line recenlty >N infection with e coli and klebsiella and was treated with cipro which she is still finshing. New pcc was inserted and she finished last dose of dapto on Saturday. SHe has knee pain when she bears weight but not at rest and she has been vigorous in PT. No fevers chils or malaise  Preventive Screening-Counseling & Management  Alcohol-Tobacco     Alcohol drinks/day: 0     Smoking Status: never  Caffeine-Diet-Exercise     Caffeine use/day: tea occasional     Does Patient Exercise: yes     Type of exercise: physical therapy     Times/week:  3  Safety-Violence-Falls     Seat Belt Use: yes      Drug Use:  never.    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 Take 1 Tablet By Mouth Two Times A Day  Allergies: No Known Drug Allergies  Past History:  Risk Factors: Alcohol Use: 0 (02/16/2010) Caffeine Use: tea occasional (02/16/2010) Exercise: yes (02/16/2010)  Risk Factors: Smoking Status: never (02/16/2010)  Past Medical History: Recurrent prosthetic knee infections::   1. Hyperlipidemia.   2. Hypertension.   3. IBS.   4. History of bilateral PE and DVT in August of 2010.   5. History of GI bleed thought to be due to hemorrhoid in August of       2010.   6. History of anemia.   Discharge Medications   1. Tylenol 650 mg by mouth every 6 hours as needed for pain.   2. Ciprofloxacin 500 mg by mouth twice daily for 10 days.   3. Robaxin 500 mg by mouth every 6 hours as needed for muscle spasm.   4. Metoprolol 25 mg by mouth twice daily.  5. MiraLax 17 g by mouth daily as needed for constipation.   6. Daptomycin 800 mg IV daily at 5 p.m. take for 7 days until you       follow up with your doctor, Dr. Zenaida Niece Da.   7. Hydrochlorothiazide 25 mg by mouth daily.   8. Micardis 80 mg by mouth daily.   9. Percocet 5/325 two tablets by mouth every 4 hours as needed for       pain.   10.Pantoprazole 40 mg by mouth daily at bedtime.   11.Pravastatin 40 mg by mouth daily at bedtime.   12.Coumadin 10 mg take 1/2 tablet by mouth daily.   13.Zetia 10 mg by mouth daily at bedtime.   Past Surgical History: Original prosthetic knee replacement in August of 2010  I and D on December 26, 2009,  Repeat incision  and drainage with polyethylene exchange for total   knee replacement including arthrostomy on January 05, 2010  Social History: Drug Use:  never  Review of Systems       se HPI otherwise negative on 12 pt review  Physical Exam  General:  alert, well-developed, and overweight-appearing.   Head:  normocephalic, atraumatic, and no abnormalities observed.   Eyes:  vision grossly intact, pupils equal, and pupils round.   Ears:  no external deformities and ear piercing(s) noted.   Nose:  no external erythema.   Mouth:  pharynx pink and moist, no erythema, and no exudates.   Neck:  supple and full ROM.   Lungs:  normal respiratory effort, no crackles, and no wheezes.   Heart:  normal rate, regular rhythm, no murmur, and no gallop.   Abdomen:  soft, non-tender, normal bowel sounds, and no distention.   Msk:  pt with well healed incision site, clean nonfluctant not erythematous Extremities:  1+ left pedal edema and 1+ right pedal edema.   Neurologic:  alert & oriented X3 and strength normal in all extremities.   Skin:  see MSK, picc line CDI Psych:  Oriented X3, memory intact for recent and remote, and normally interactive.     Impression & Recommendations:  Problem # 1:  PROSTHETIC JOINT COMPLICATION (ICD-996.77) check labs today via gentiva, dc picc, start doxycyline 100mg  two times a day for chronic suppressive therapy for now plan is indefinite Orders: T-Basic Metabolic Panel [id number]) T-C-Reactive Protein [id number]) T-CBC w/Diff [id number]) T-Sed Rate (Automated) [id number]) Est. Patient Level IV (28413)  Problem # 2:  BACTEREMIA (ICD-790.7)  was from PICC, finishe her 2 wks and recheck blood cultures 2 wks post abx Her updated medication list for this problem includes:    Doxycycline Hyclate 100 Mg Tabs (Doxycycline hyclate) .Marland Kitchen... Take 1 tablet by mouth two times a day  Orders: Est. Patient Level IV (24401)  Problem # 3:  KLEBSIELLA PNEUMONIAE INFECTION  (ICD-041.3) see abvove was cause of her bacteremia Orders: Est. Patient Level IV (99214)Future Orders: T-Culture, Blood Routine ([id number]) ... 03/06/2010 T-Culture, Blood Routine ([id number]) ... 03/06/2010  Problem # 4:  METHICILLIN RESISTANT STAPHYLOCOCCUS AUREUS INFECTION (ICD-041.12)  mRSa was the culprit with the prosthetic knee infection  Orders: Est. Patient Level IV (38756)  Medications Added to Medication List This Visit: 1)  Doxycycline Hyclate 100 Mg Tabs (Doxycycline hyclate) .... Take 1 tablet by mouth two times a day  Patient Instructions: 1)  finish your ciprofloxacin 2)  start doxycline 100mg  twice daily indefinitely 3)  rtc for blood cultues draw after the 2nd of March 4)  rtc to see  Dr Daiva Eves in 2-3 months Prescriptions: DOXYCYCLINE HYCLATE 100 MG TABS (DOXYCYCLINE HYCLATE) Take 1 tablet by mouth two times a day  #60 x 11   Entered and Authorized by:   Acey Lav MD   Signed by:   Paulette Blanch Dam MD on 02/16/2010   Method used:   Electronically to        [facility] [address] [phone]* (retail)       [address]       [address]       [place], [place]  [address]       Ph: [phone]       Fax: [phone]   RxID:   [id number]    Orders Added: 1)  T-Basic Metabolic Panel [id number] 2)  T-C-Reactive Protein [[id number]] 3)  T-CBC w/Diff [[id number]] 4)  T-Sed Rate (Automated) [[id number]] 5)  T-Culture, Blood Routine [[id number]] 6)  T-Culture, Blood Routine [[id number]] 7)  Est. Patient Level IV [09323]

## 2010-02-25 NOTE — Progress Notes (Signed)
Summary: Genevieve Norlander orders faxed  Phone Note Outgoing Call   Call placed by: Annice Pih Summary of Call: orders for labs and PICC removal faxed to [facility] at [phone] Initial call taken by: Wendall Mola CMA [facility]),  February 16, 2010 11:59 AM

## 2010-02-25 NOTE — Progress Notes (Signed)
Summary: office note faxed  Phone Note Outgoing Call   Call placed by: Annice Pih Summary of Call: office note faxed to case manager Gordy Councilman at [phone] Initial call taken by: Wendall Mola CMA [facility]),  February 16, 2010 3:25 PM

## 2010-02-25 NOTE — Progress Notes (Signed)
Summary: Care Plan [facility]ersight  Phone Note Outgoing Call   Call placed by: Acey Lav MD,  February 16, 2010 11:58 AM Details for Reason: Care Plan Oversight Summary of Call: [phone](> 60 mins) I have supervised home care and/or infusion therapy for this pt, including providing orders for care, review of labs and/or home health care plans, communicating with the home health care professionals and/or patient/caregivers to integrate current information into the medical treatment plan and/or adjust the medical therapy. This supervision has been provided for _62__minutes during the calendar month. Dates for this oversight January 12th thru February 16, 2010.  Treating prosthetic joint infection and treatig PICC line infection   Initial call taken by: Acey Lav MD,  February 16, 2010 11:58 AM

## 2010-02-25 NOTE — Miscellaneous (Signed)
  Clinical Lists Changes  Medications: Added new medication of TYLENOL ARTHRITIS PAIN 650 MG CR-TABS (ACETAMINOPHEN) every six hours as needed for pain Added new medication of ROBAXIN 500 MG TABS (METHOCARBAMOL) every six hours as needed for muscle spasm Added new medication of METOPROLOL TARTRATE 25 MG TABS (METOPROLOL TARTRATE) Take 1 tablet by mouth two times a day Added new medication of MIRALAX  POWD (POLYETHYLENE GLYCOL 3350) as directed for constipation Added new medication of CIPRO 500 MG TABS (CIPROFLOXACIN HCL) Take 1 tablet by mouth two times a day x 10 days Added new medication of HYDROCHLOROTHIAZIDE 25 MG TABS (HYDROCHLOROTHIAZIDE) Take 1 tablet by mouth once a day Added new medication of MICARDIS 80 MG TABS (TELMISARTAN) Take 1 tablet by mouth once a day Added new medication of PERCOCET 5-325 MG TABS (OXYCODONE-ACETAMINOPHEN) two tablets every four hours as needed for pain Added new medication of PANTOPRAZOLE SODIUM 40 MG TBEC (PANTOPRAZOLE SODIUM) Take 1 tablet by mouth once a day Added new medication of PRAVACHOL 40 MG TABS (PRAVASTATIN SODIUM) Take 1 tablet by mouth once a day Added new medication of COUMADIN 10 MG TABS (WARFARIN SODIUM) take a half a tablet by  mouth daily Added new medication of ZETIA 10 MG TABS (EZETIMIBE) Take 1 tablet by mouth once a day Allergies: Added new allergy or adverse reaction of * ZOSYN Added new allergy or adverse reaction of * CEFEPIME Observations: Added new observation of NKA: F (02/16/2010 12:02)

## 2010-03-03 NOTE — Consult Note (Signed)
Summary: [facility]  [facility]   Imported By: Florinda Marker 02/27/2010 16:24:48  _____________________________________________________________________  External Attachment:    Type:   Image     Comment:   External Document

## 2010-03-03 NOTE — Progress Notes (Signed)
Summary: Care Plan OVersight  Phone Note Outgoing Call   Call placed by: Acey Lav MD,  February 24, 2010 5:43 PM Details for Reason: Care Plan OVersight Summary of Call: [phone] (30 or more mins)  I have supervised home care and/or infusion therapy for this pt, including providing orders for care, review of labs and/or home health care plans, communicating with the home health care professionals and/or patient/caregivers to integrate current information into the medical treatment plan and/or adjust the medical therapy. This supervision has been provided for _34_minutes during the calendar month. Dates for this oversight February 13th, 2012 thru March 17, 2010.  Prosthetic joint infection being treated Initial call taken by: Acey Lav MD,  February 24, 2010 5:44 PM

## 2010-03-03 NOTE — Medication Information (Signed)
Summary: [facility]:  [facility]:   Imported By: Florinda Marker 02/27/2010 16:25:55  _____________________________________________________________________  External Attachment:    Type:   Image     Comment:   External Document

## 2010-03-09 ENCOUNTER — Encounter (INDEPENDENT_AMBULATORY_CARE_PROVIDER_SITE_OTHER): Payer: Worker's Compensation

## 2010-03-09 DIAGNOSIS — R0989 Other specified symptoms and signs involving the circulatory and respiratory systems: Secondary | ICD-10-CM

## 2010-03-10 ENCOUNTER — Encounter: Payer: Self-pay | Admitting: Licensed Clinical Social Worker

## 2010-03-16 LAB — COMPREHENSIVE METABOLIC PANEL
AST: 15 U/L (ref 0–37)
AST: 27 U/L (ref 0–37)
Albumin: 3.6 g/dL (ref 3.5–5.2)
Alkaline Phosphatase: 73 U/L (ref 39–117)
Alkaline Phosphatase: 82 U/L (ref 39–117)
BUN: 13 mg/dL (ref 6–23)
CO2: 26 mEq/L (ref 19–32)
Chloride: 105 mEq/L (ref 96–112)
Chloride: 103 mEq/L (ref 96–112)
Creatinine, Ser: 0.85 mg/dL (ref 0.4–1.2)
GFR calc Af Amer: >60 mL/min (ref >60)
GFR calc non Af Amer: >60 mL/min (ref >60)
Potassium: 3.8 mEq/L (ref 3.5–5.1)
Total Bilirubin: 0.5 mg/dL (ref 0.3–1.2)
Total Bilirubin: 0.8 mg/dL (ref 0.3–1.2)

## 2010-03-16 LAB — TISSUE CULTURE
Culture: NO GROWTH
Gram Stain: NONE SEEN

## 2010-03-16 LAB — DIFFERENTIAL
Basophils Absolute: 0 10*3/uL (ref 0.0–0.1)
Basophils Absolute: 0 10*3/uL (ref 0.0–0.1)
Basophils Absolute: 0 10*3/uL (ref 0.0–0.1)
Basophils Relative: 0 % (ref 0–1)
Basophils Relative: 0 % (ref 0–1)
Basophils Relative: 0 % (ref 0–1)
Eosinophils Absolute: 0 10*3/uL (ref 0.0–0.7)
Eosinophils Absolute: 0.1 10*3/uL (ref 0.0–0.7)
Eosinophils Relative: 0 % (ref 0–5)
Eosinophils Relative: 0 % (ref 0–5)
Eosinophils Relative: 1 % (ref 0–5)
Lymphocytes Relative: 22 % (ref 12–46)
Lymphocytes Relative: 6 % — ABNORMAL LOW (ref 12–46)
Lymphs Abs: 1.9 10*3/uL (ref 0.7–4.0)
Monocytes Absolute: 0.7 10*3/uL (ref 0.1–1.0)
Monocytes Absolute: 0.6 10*3/uL (ref 0.1–1.0)
Monocytes Relative: 7 % (ref 3–12)
Neutro Abs: 6.6 10*3/uL (ref 1.7–7.7)
Neutro Abs: 5.8 10*3/uL (ref 1.7–7.7)
Neutrophils Relative %: 70 % (ref 43–77)

## 2010-03-16 LAB — CULTURE, BLOOD (ROUTINE X 2)
Culture  Setup Time: 201201021122
Culture  Setup Time: 201201021122
Culture: NO GROWTH
Culture: NO GROWTH

## 2010-03-16 LAB — URINALYSIS, ROUTINE W REFLEX MICROSCOPIC
Bilirubin Urine: NEGATIVE
Glucose, UA: NEGATIVE mg/dL
Glucose, UA: NEGATIVE mg/dL
Ketones, ur: NEGATIVE mg/dL
Ketones, ur: NEGATIVE mg/dL
Ketones, ur: NEGATIVE mg/dL
Leukocytes, UA: NEGATIVE
Nitrite: NEGATIVE
Protein, ur: NEGATIVE mg/dL
Protein, ur: NEGATIVE mg/dL
Urobilinogen, UA: 1 mg/dL (ref 0.0–1.0)
pH: 6 (ref 5.0–8.0)

## 2010-03-16 LAB — BODY FLUID CULTURE

## 2010-03-16 LAB — ANAEROBIC CULTURE: Gram Stain: NONE SEEN

## 2010-03-16 LAB — CBC
HCT: 33.8 % — ABNORMAL LOW (ref 36.0–46.0)
HCT: 37.4 % (ref 36.0–46.0)
Hemoglobin: 13.7 g/dL (ref 12.0–15.0)
Hemoglobin: 10.6 g/dL — ABNORMAL LOW (ref 12.0–15.0)
Hemoglobin: 12.9 g/dL (ref 12.0–15.0)
MCH: 27.6 pg (ref 26.0–34.0)
MCH: 27.8 pg (ref 26.0–34.0)
MCHC: 31.4 g/dL (ref 30.0–36.0)
MCV: 83.6 fL (ref 78.0–100.0)
MCV: 85.4 fL (ref 78.0–100.0)
MCV: 85.4 fL (ref 78.0–100.0)
MCV: 86 fL (ref 78.0–100.0)
Platelets: 239 10*3/uL (ref 150–400)
Platelets: 273 10*3/uL (ref 150–400)
RBC: 5.01 MIL/uL (ref 3.87–5.11)
RBC: 4.35 MIL/uL (ref 3.87–5.11)
RBC: 4.67 MIL/uL (ref 3.87–5.11)
RDW: 15.9 % — ABNORMAL HIGH (ref 11.5–15.5)
WBC: 10.1 10*3/uL (ref 4.0–10.5)
WBC: 27.1 10*3/uL — ABNORMAL HIGH (ref 4.0–10.5)

## 2010-03-16 LAB — BASIC METABOLIC PANEL
BUN: 12 mg/dL (ref 6–23)
BUN: 8 mg/dL (ref 6–23)
CO2: 27 mEq/L (ref 19–32)
Chloride: 104 mEq/L (ref 96–112)
Chloride: 106 mEq/L (ref 96–112)
Creatinine, Ser: 0.79 mg/dL (ref 0.4–1.2)
GFR calc Af Amer: >60 mL/min (ref >60)
Glucose, Bld: 147 mg/dL — ABNORMAL HIGH (ref 70–99)
Potassium: 3.5 mEq/L (ref 3.5–5.1)
Sodium: 139 mEq/L (ref 135–145)

## 2010-03-16 LAB — URINE MICROSCOPIC-ADD ON

## 2010-03-16 LAB — GRAM STAIN

## 2010-03-16 LAB — WOUND CULTURE

## 2010-03-16 LAB — SURGICAL PCR SCREEN
MRSA, PCR: NEGATIVE
Staphylococcus aureus: NEGATIVE

## 2010-03-16 LAB — POCT I-STAT, CHEM 8
BUN: 10 mg/dL (ref 6–23)
Creatinine, Ser: 0.8 mg/dL (ref 0.4–1.2)
Glucose, Bld: 102 mg/dL — ABNORMAL HIGH (ref 70–99)
Hemoglobin: 12.9 g/dL (ref 12.0–15.0)
Potassium: 3.3 mEq/L — ABNORMAL LOW (ref 3.5–5.1)
Sodium: 137 mEq/L (ref 135–145)

## 2010-03-16 LAB — URINE CULTURE
Colony Count: NO GROWTH
Culture  Setup Time: 201201030133
Culture: NO GROWTH
Special Requests: NEGATIVE

## 2010-03-16 LAB — PROCALCITONIN: Procalcitonin: <0.1 ng/mL

## 2010-03-17 LAB — BASIC METABOLIC PANEL
BUN: 8 mg/dL (ref 6–23)
BUN: 8 mg/dL (ref 6–23)
CO2: 26 mEq/L (ref 19–32)
CO2: 28 mEq/L (ref 19–32)
CO2: 29 mEq/L (ref 19–32)
CO2: 32 mEq/L (ref 19–32)
Calcium: 8.3 mg/dL — ABNORMAL LOW (ref 8.4–10.5)
Calcium: 8.7 mg/dL (ref 8.4–10.5)
Chloride: 103 mEq/L (ref 96–112)
Chloride: 103 mEq/L (ref 96–112)
Creatinine, Ser: 0.65 mg/dL (ref 0.4–1.2)
Creatinine, Ser: 0.79 mg/dL (ref 0.4–1.2)
GFR calc Af Amer: >60 mL/min (ref >60)
GFR calc Af Amer: >60 mL/min (ref >60)
GFR calc non Af Amer: >60 mL/min (ref >60)
GFR calc non Af Amer: >60 mL/min (ref >60)
GFR calc non Af Amer: >60 mL/min (ref >60)
GFR calc non Af Amer: >60 mL/min (ref >60)
Glucose, Bld: 103 mg/dL — ABNORMAL HIGH (ref 70–99)
Glucose, Bld: 127 mg/dL — ABNORMAL HIGH (ref 70–99)
Glucose, Bld: 87 mg/dL (ref 70–99)
Glucose, Bld: 97 mg/dL (ref 70–99)
Potassium: 3.1 mEq/L — ABNORMAL LOW (ref 3.5–5.1)
Potassium: 3.2 mEq/L — ABNORMAL LOW (ref 3.5–5.1)
Potassium: 3.3 mEq/L — ABNORMAL LOW (ref 3.5–5.1)
Potassium: 3.4 mEq/L — ABNORMAL LOW (ref 3.5–5.1)
Sodium: 137 mEq/L (ref 135–145)
Sodium: 137 mEq/L (ref 135–145)
Sodium: 139 mEq/L (ref 135–145)
Sodium: 139 mEq/L (ref 135–145)

## 2010-03-17 LAB — URINE MICROSCOPIC-ADD ON

## 2010-03-17 LAB — CBC
HCT: 28.7 % — ABNORMAL LOW (ref 36.0–46.0)
HCT: 29.6 % — ABNORMAL LOW (ref 36.0–46.0)
HCT: 31.2 % — ABNORMAL LOW (ref 36.0–46.0)
HCT: 32.8 % — ABNORMAL LOW (ref 36.0–46.0)
HCT: 44.1 % (ref 36.0–46.0)
Hemoglobin: 10 g/dL — ABNORMAL LOW (ref 12.0–15.0)
Hemoglobin: 10.6 g/dL — ABNORMAL LOW (ref 12.0–15.0)
Hemoglobin: 14.7 g/dL (ref 12.0–15.0)
Hemoglobin: 9.3 g/dL — ABNORMAL LOW (ref 12.0–15.0)
Hemoglobin: 9.3 g/dL — ABNORMAL LOW (ref 12.0–15.0)
Hemoglobin: 9.6 g/dL — ABNORMAL LOW (ref 12.0–15.0)
MCH: 26.5 pg (ref 26.0–34.0)
MCH: 26.6 pg (ref 26.0–34.0)
MCH: 26.7 pg (ref 26.0–34.0)
MCH: 27.1 pg (ref 26.0–34.0)
MCHC: 32.4 g/dL (ref 30.0–36.0)
MCHC: 32.4 g/dL (ref 30.0–36.0)
MCHC: 32.9 g/dL (ref 30.0–36.0)
MCHC: 33.3 g/dL (ref 30.0–36.0)
MCV: 81.2 fL (ref 78.0–100.0)
MCV: 81.8 fL (ref 78.0–100.0)
MCV: 84.1 fL (ref 78.0–100.0)
Platelets: 216 10*3/uL (ref 150–400)
Platelets: 232 10*3/uL (ref 150–400)
Platelets: 272 10*3/uL (ref 150–400)
RBC: 3.51 MIL/uL — ABNORMAL LOW (ref 3.87–5.11)
RBC: 3.71 MIL/uL — ABNORMAL LOW (ref 3.87–5.11)
RBC: 4.03 MIL/uL (ref 3.87–5.11)
RBC: 4.46 MIL/uL (ref 3.87–5.11)
RDW: 14.9 % (ref 11.5–15.5)
RDW: 15.1 % (ref 11.5–15.5)
RDW: 15.2 % (ref 11.5–15.5)
RDW: 15.2 % (ref 11.5–15.5)
RDW: 15.3 % (ref 11.5–15.5)
RDW: 17 % — ABNORMAL HIGH (ref 11.5–15.5)
WBC: 10.6 10*3/uL — ABNORMAL HIGH (ref 4.0–10.5)
WBC: 11.3 10*3/uL — ABNORMAL HIGH (ref 4.0–10.5)
WBC: 11.9 10*3/uL — ABNORMAL HIGH (ref 4.0–10.5)
WBC: 15.8 10*3/uL — ABNORMAL HIGH (ref 4.0–10.5)
WBC: 9.9 10*3/uL (ref 4.0–10.5)

## 2010-03-17 LAB — DIFFERENTIAL
Basophils Relative: 0 % (ref 0–1)
Lymphs Abs: 2.2 10*3/uL (ref 0.7–4.0)
Monocytes Absolute: 1 10*3/uL (ref 0.1–1.0)
Monocytes Relative: 10 % (ref 3–12)
Neutro Abs: 6.6 10*3/uL (ref 1.7–7.7)

## 2010-03-17 LAB — COMPREHENSIVE METABOLIC PANEL
ALT: 20 U/L (ref 0–35)
Albumin: 2.2 g/dL — ABNORMAL LOW (ref 3.5–5.2)
Alkaline Phosphatase: 66 U/L (ref 39–117)
Potassium: 4 mEq/L (ref 3.5–5.1)
Sodium: 140 mEq/L (ref 135–145)
Total Protein: 6 g/dL (ref 6.0–8.3)

## 2010-03-17 LAB — URINALYSIS, ROUTINE W REFLEX MICROSCOPIC
Bilirubin Urine: NEGATIVE
Glucose, UA: NEGATIVE mg/dL
Hgb urine dipstick: NEGATIVE
Specific Gravity, Urine: 1.016 (ref 1.005–1.030)
Urobilinogen, UA: 0.2 mg/dL (ref 0.0–1.0)
pH: 6 (ref 5.0–8.0)

## 2010-03-17 LAB — ABO/RH: ABO/RH(D): O POS

## 2010-03-17 LAB — TYPE AND SCREEN: ABO/RH(D): O POS

## 2010-03-17 LAB — SURGICAL PCR SCREEN: Staphylococcus aureus: NEGATIVE

## 2010-03-17 LAB — CARDIAC PANEL(CRET KIN+CKTOT+MB+TROPI): Total CK: 44 U/L (ref 7–177)

## 2010-03-18 ENCOUNTER — Encounter (INDEPENDENT_AMBULATORY_CARE_PROVIDER_SITE_OTHER): Payer: BC Managed Care – PPO

## 2010-03-18 DIAGNOSIS — Z7901 Long term (current) use of anticoagulants: Secondary | ICD-10-CM

## 2010-03-18 DIAGNOSIS — I82409 Acute embolism and thrombosis of unspecified deep veins of unspecified lower extremity: Secondary | ICD-10-CM

## 2010-03-25 ENCOUNTER — Ambulatory Visit (INDEPENDENT_AMBULATORY_CARE_PROVIDER_SITE_OTHER): Payer: Worker's Compensation | Admitting: *Deleted

## 2010-03-25 DIAGNOSIS — I82409 Acute embolism and thrombosis of unspecified deep veins of unspecified lower extremity: Secondary | ICD-10-CM

## 2010-03-25 DIAGNOSIS — I119 Hypertensive heart disease without heart failure: Secondary | ICD-10-CM

## 2010-03-25 DIAGNOSIS — Z7901 Long term (current) use of anticoagulants: Secondary | ICD-10-CM

## 2010-03-26 ENCOUNTER — Encounter: Payer: Self-pay | Admitting: Cardiology

## 2010-03-27 ENCOUNTER — Ambulatory Visit (INDEPENDENT_AMBULATORY_CARE_PROVIDER_SITE_OTHER): Payer: Worker's Compensation | Admitting: Cardiology

## 2010-03-27 ENCOUNTER — Encounter: Payer: Self-pay | Admitting: Cardiology

## 2010-03-27 VITALS — BP 138/90 | HR 76 | Wt 326.0 lb

## 2010-03-27 DIAGNOSIS — I10 Essential (primary) hypertension: Secondary | ICD-10-CM | POA: Insufficient documentation

## 2010-03-27 DIAGNOSIS — E7800 Pure hypercholesterolemia, unspecified: Secondary | ICD-10-CM

## 2010-03-27 DIAGNOSIS — I82409 Acute embolism and thrombosis of unspecified deep veins of unspecified lower extremity: Secondary | ICD-10-CM

## 2010-03-27 DIAGNOSIS — T8489XA Other specified complication of internal orthopedic prosthetic devices, implants and grafts, initial encounter: Secondary | ICD-10-CM

## 2010-03-27 LAB — COMPREHENSIVE METABOLIC PANEL
Albumin: 3.7 g/dL (ref 3.5–5.2)
BUN: 15 mg/dL (ref 6–23)
CO2: 27 mEq/L (ref 19–32)
Calcium: 9.5 mg/dL (ref 8.4–10.5)
Chloride: 101 mEq/L (ref 96–112)
Glucose, Bld: 94 mg/dL (ref 70–99)
Potassium: 3.7 mEq/L (ref 3.5–5.3)
Sodium: 142 mEq/L (ref 135–145)
Total Protein: 7.3 g/dL (ref 6.0–8.3)

## 2010-03-27 LAB — LIPID PANEL
Cholesterol: 188 mg/dL (ref 0–200)
HDL: 40 mg/dL (ref >39)
Triglycerides: 127 mg/dL (ref <150)

## 2010-03-27 NOTE — Assessment & Plan Note (Signed)
The patient is on long-term Coumadin.  Her INR 2 days ago was subtherapeutic at 1.4.  She is now on 5 mg 5 days a week and 2.5 mg 2 days week.  She will return in another 2 weeks for a followup protime.  She's not having any bleeding problems from her Coumadin.

## 2010-03-27 NOTE — Assessment & Plan Note (Signed)
No evidence of any active infection of the joint clinically.  She is being followed closely by infectious disease.

## 2010-03-27 NOTE — Progress Notes (Signed)
HPI: This pleasant 55 year old woman is seen back for followup office visit.  She has a past history of bilateral pulmonary emboli in 2010 associated with a DVT following right knee replacement.  She has had some postoperative problems with infection of the knee and has been under the care of infectious disease.  She is still on doxycycline.  From a cardiac standpoint she's been stable with no increased dyspnea and no exertional chest pain.  Unfortunately she has not been able to lose any significant amount of weight.  Current Outpatient Prescriptions  Medication Sig Dispense Refill  . acetaminophen (TYLENOL ARTHRITIS PAIN) 650 MG CR tablet Take 650 mg by mouth every 6 (six) hours as needed.        Marland Kitchen DOXYCYCLINE HYCLATE PO Take by mouth. Take 100 mg tablets by mouth two times daily       . ezetimibe (ZETIA) 10 MG tablet Take 10 mg by mouth daily.        . hydrochlorothiazide 25 MG tablet Take 25 mg by mouth daily.        . methocarbamol (ROBAXIN) 500 MG tablet Take 500 mg by mouth every 6 (six) hours as needed. As needed for muscle spasm       . metoprolol tartrate (LOPRESSOR) 25 MG tablet Take 25 mg by mouth 2 (two) times daily.        Marland Kitchen oxyCODONE-acetaminophen (PERCOCET) 5-325 MG per tablet Take 2 tablets by mouth every 4 (four) hours as needed.        . polyethylene glycol (MIRALAX) powder Take 17 g by mouth. Use as directed for constipation       . pravastatin (PRAVACHOL) 40 MG tablet Take 40 mg by mouth daily.        Marland Kitchen telmisartan (MICARDIS) 80 MG tablet Take 80 mg by mouth daily.        Marland Kitchen warfarin (COUMADIN) 10 MG tablet Take by mouth daily. 5mg  5days and 2.5mg  for 2days      . ciprofloxacin (CIPRO) 500 MG tablet Take 500 mg by mouth 2 (two) times daily. For 10 days       . pantoprazole (PROTONIX) 40 MG tablet Take 40 mg by mouth daily.          Allergies  Allergen Reactions  . Amlodipine     Edema,ha  . Crestor (Rosuvastatin Calcium)     Chest pain  . Zosyn     Patient Active  Problem List  Diagnoses  . METHICILLIN RESISTANT STAPHYLOCOCCUS AUREUS INFECTION  . KLEBSIELLA PNEUMONIAE INFECTION  . BACTEREMIA  . PROSTHETIC JOINT COMPLICATION  . Essential hypertension  . DVT (deep venous thrombosis)    History  Smoking status  . Never Smoker   Smokeless tobacco  . Not on file    History  Alcohol Use: Not on file    No family history on file.  Review of Systems: The patient denies any heat or cold intolerance.  No weight gain or weight loss.  The patient denies headaches or blurry vision.  There is no cough or sputum production.  The patient denies dizziness.  There is no hematuria or hematochezia.  The patient denies any muscle aches or arthritis.  The patient denies any rash.  The patient denies frequent falling or instability.  There is no history of depression or anxiety.  All other systems were reviewed and are negative.   Physical Exam: Vital signs as reviewed.  Weight 326, up 2 pounds.  Blood pressure 130/90 the general  appearance reveals a large woman in no acute distress.Pupils equal and reactive.   Extraocular Movements are full.  There is no scleral icterus.  The mouth and pharynx are normal.  The neck is supple.  The carotids reveal no bruits.  The jugular venous pressure is normal.  The thyroid is not enlarged.  There is no lymphadenopathy.The chest is clear to percussion and auscultation. There are no rales or rhonchi. Expansion of the chest is symmetrical.The precordium is quiet.  The first heart sound is normal.  The second heart sound is physiologically split.  There is no murmur gallop rub or click.  There is no abnormal lift or heave.The abdomen is soft and nontender. Bowel sounds are normal. The liver and spleen are not enlarged. There Are no abdominal masses. There are no bruits.  Extremities reveal trace ankle edema bilaterally.  There is no evidence of any acute or active DVT at this time.    Assessment / Plan:

## 2010-03-30 ENCOUNTER — Other Ambulatory Visit: Payer: Self-pay | Admitting: *Deleted

## 2010-03-30 DIAGNOSIS — M009 Pyogenic arthritis, unspecified: Secondary | ICD-10-CM

## 2010-03-30 NOTE — Telephone Encounter (Signed)
States her hair has been falling out ever since she started the doxy & metoprolol. Not listed as a side effect on line. Wants md input & what to do. Will call her when I get a response.Faustino Congress

## 2010-03-30 NOTE — Telephone Encounter (Signed)
I am not familiar with either med causing that side effect. We could change her to Bactrim Ds one bid in place of doxy as long as she has no sulfa alllergy though this will mean she will need her coumadin much more closely monitored. I have dcd the doxy and put in rx for bactrim.Can we find out where she wants it sent and also flag her pcp to check her INR soon on this med?

## 2010-03-31 MED ORDER — SULFAMETHOXAZOLE-TRIMETHOPRIM 800-160 MG PO TABS: 1.0000 | ORAL_TABLET | Freq: Two times a day (BID) | ORAL | Status: DC

## 2010-03-31 NOTE — Telephone Encounter (Signed)
Kennon Rounds can we find out where Mrs Mounsey wants a different antibiotic sent? Thanks

## 2010-04-03 ENCOUNTER — Telehealth: Payer: Self-pay | Admitting: *Deleted

## 2010-04-03 ENCOUNTER — Other Ambulatory Visit: Payer: Self-pay | Admitting: Infectious Disease

## 2010-04-03 DIAGNOSIS — M009 Pyogenic arthritis, unspecified: Secondary | ICD-10-CM

## 2010-04-03 MED ORDER — SULFAMETHOXAZOLE-TMP DS 800-160 MG PO TABS: 1.0000 | ORAL_TABLET | Freq: Two times a day (BID) | ORAL | Status: DC

## 2010-04-03 NOTE — Telephone Encounter (Signed)
Advised of labs 

## 2010-04-03 NOTE — Progress Notes (Signed)
Advised patient

## 2010-04-03 NOTE — Telephone Encounter (Signed)
With her being switched to Bactrim, return in about one week for a protime.

## 2010-04-03 NOTE — Progress Notes (Signed)
Advised of labs 

## 2010-04-03 NOTE — Telephone Encounter (Signed)
Has protime scheduled 04/08/10.

## 2010-04-03 NOTE — Telephone Encounter (Signed)
Message copied by Regis Bill on Fri Apr 03, 2010  9:58 AM ------      Message from: Cassell Clement      Created: Mon Mar 30, 2010  7:15 PM       Please report.  Labs are satisfactory except for slightly elevated LDL.  Continue to work hard on diet and weight loss.  Continue same medication.

## 2010-04-06 ENCOUNTER — Telehealth: Payer: Self-pay | Admitting: Cardiology

## 2010-04-06 ENCOUNTER — Other Ambulatory Visit (INDEPENDENT_AMBULATORY_CARE_PROVIDER_SITE_OTHER): Payer: BC Managed Care – PPO

## 2010-04-06 ENCOUNTER — Other Ambulatory Visit: Payer: Self-pay | Admitting: Adult Health

## 2010-04-06 DIAGNOSIS — A4902 Methicillin resistant Staphylococcus aureus infection, unspecified site: Secondary | ICD-10-CM

## 2010-04-06 NOTE — Telephone Encounter (Signed)
PT SAID SHE IS RETURNING MELINDA'S CALL. PLACED CHART IN BOX.

## 2010-04-07 ENCOUNTER — Telehealth: Payer: Self-pay | Admitting: *Deleted

## 2010-04-07 NOTE — Telephone Encounter (Signed)
Called patient back and she was not home.  Will speak with her when she comes for INR tomorrow.

## 2010-04-07 NOTE — Telephone Encounter (Addendum)
Refill request received from [facility] to refill Septra DS.  Do you want this refilled?  Please Advise. Jennet Maduro, RN Phone call to [employer].  RX required a pre-authorization.  The pt was not able to start the Bactrim DS until yesterday.  RN phoned the patient.  She did pick up the rx yesterday and started it.  RN advised pt to stop the doxycycline per Dr. Clinton Gallant request.  Pt verbalized understanding. Jennet Maduro, RN

## 2010-04-08 ENCOUNTER — Ambulatory Visit (INDEPENDENT_AMBULATORY_CARE_PROVIDER_SITE_OTHER): Payer: Worker's Compensation | Admitting: *Deleted

## 2010-04-08 ENCOUNTER — Other Ambulatory Visit: Payer: Self-pay | Admitting: Cardiology

## 2010-04-08 ENCOUNTER — Other Ambulatory Visit: Payer: Self-pay | Admitting: *Deleted

## 2010-04-08 DIAGNOSIS — Z7901 Long term (current) use of anticoagulants: Secondary | ICD-10-CM

## 2010-04-08 DIAGNOSIS — I10 Essential (primary) hypertension: Secondary | ICD-10-CM

## 2010-04-08 DIAGNOSIS — I82409 Acute embolism and thrombosis of unspecified deep veins of unspecified lower extremity: Secondary | ICD-10-CM

## 2010-04-08 LAB — POCT INR: INR: 1.1

## 2010-04-08 NOTE — Telephone Encounter (Signed)
Thanks

## 2010-04-08 NOTE — Telephone Encounter (Signed)
Yes and she should stop her doxycycline

## 2010-04-09 ENCOUNTER — Other Ambulatory Visit: Payer: Self-pay | Admitting: *Deleted

## 2010-04-09 DIAGNOSIS — R609 Edema, unspecified: Secondary | ICD-10-CM

## 2010-04-09 MED ORDER — FUROSEMIDE 40 MG PO TABS: ORAL_TABLET | ORAL | Status: DC

## 2010-04-09 NOTE — Telephone Encounter (Signed)
Refill request by patient

## 2010-04-09 NOTE — Telephone Encounter (Signed)
escribe refill request

## 2010-04-11 LAB — BASIC METABOLIC PANEL
BUN: 8 mg/dL (ref 6–23)
CO2: 24 mEq/L (ref 19–32)
Chloride: 104 mEq/L (ref 96–112)
Chloride: 105 mEq/L (ref 96–112)
Creatinine, Ser: 0.7 mg/dL (ref 0.4–1.2)
GFR calc Af Amer: >60 mL/min (ref >60)
GFR calc non Af Amer: >60 mL/min (ref >60)
Potassium: 3.9 mEq/L (ref 3.5–5.1)
Sodium: 136 mEq/L (ref 135–145)
Sodium: 138 mEq/L (ref 135–145)

## 2010-04-11 LAB — CBC
HCT: 39.3 % (ref 36.0–46.0)
Hemoglobin: 11.9 g/dL — ABNORMAL LOW (ref 12.0–15.0)
Hemoglobin: 12.9 g/dL (ref 12.0–15.0)
MCHC: 33.4 g/dL (ref 30.0–36.0)
MCV: 83.4 fL (ref 78.0–100.0)
MCV: 83.8 fL (ref 78.0–100.0)
Platelets: 204 10*3/uL (ref 150–400)
RBC: 4.23 MIL/uL (ref 3.87–5.11)
WBC: 10.1 10*3/uL (ref 4.0–10.5)
WBC: 10.6 10*3/uL — ABNORMAL HIGH (ref 4.0–10.5)

## 2010-04-11 LAB — PROTIME-INR
INR: 1.4 (ref 0.00–1.49)
Prothrombin Time: 17.2 seconds — ABNORMAL HIGH (ref 11.6–15.2)

## 2010-04-12 LAB — CBC
HCT: 34 % — ABNORMAL LOW (ref 36.0–46.0)
HCT: 37 % (ref 36.0–46.0)
HCT: 40.2 % (ref 36.0–46.0)
HCT: 44 % (ref 36.0–46.0)
HCT: 44.8 % (ref 36.0–46.0)
Hemoglobin: 11.4 g/dL — ABNORMAL LOW (ref 12.0–15.0)
Hemoglobin: 12.4 g/dL (ref 12.0–15.0)
Hemoglobin: 14.7 g/dL (ref 12.0–15.0)
Hemoglobin: 14.8 g/dL (ref 12.0–15.0)
Hemoglobin: 14.8 g/dL (ref 12.0–15.0)
MCHC: 33 g/dL (ref 30.0–36.0)
MCHC: 33.3 g/dL (ref 30.0–36.0)
MCHC: 33.5 g/dL (ref 30.0–36.0)
MCHC: 34.6 g/dL (ref 30.0–36.0)
MCV: 83.4 fL (ref 78.0–100.0)
MCV: 83.6 fL (ref 78.0–100.0)
MCV: 83.9 fL (ref 78.0–100.0)
MCV: 83.9 fL (ref 78.0–100.0)
MCV: 84.2 fL (ref 78.0–100.0)
Platelets: 196 10*3/uL (ref 150–400)
RBC: 4.06 MIL/uL (ref 3.87–5.11)
RBC: 4.41 MIL/uL (ref 3.87–5.11)
RBC: 5.14 MIL/uL — ABNORMAL HIGH (ref 3.87–5.11)
RBC: 5.22 MIL/uL — ABNORMAL HIGH (ref 3.87–5.11)
RDW: 15.3 % (ref 11.5–15.5)
RDW: 15.4 % (ref 11.5–15.5)
WBC: 12.4 10*3/uL — ABNORMAL HIGH (ref 4.0–10.5)
WBC: 13.4 10*3/uL — ABNORMAL HIGH (ref 4.0–10.5)

## 2010-04-12 LAB — DIFFERENTIAL
Basophils Absolute: 0 10*3/uL (ref 0.0–0.1)
Basophils Absolute: 0 10*3/uL (ref 0.0–0.1)
Basophils Relative: 0 % (ref 0–1)
Basophils Relative: 0 % (ref 0–1)
Eosinophils Absolute: 0 10*3/uL (ref 0.0–0.7)
Eosinophils Absolute: 0 10*3/uL (ref 0.0–0.7)
Eosinophils Absolute: 0 10*3/uL (ref 0.0–0.7)
Eosinophils Relative: 0 % (ref 0–5)
Eosinophils Relative: 0 % (ref 0–5)
Eosinophils Relative: 0 % (ref 0–5)
Eosinophils Relative: 0 % (ref 0–5)
Lymphocytes Relative: 15 % (ref 12–46)
Lymphs Abs: 3.4 10*3/uL (ref 0.7–4.0)
Monocytes Absolute: 0.4 10*3/uL (ref 0.1–1.0)
Monocytes Absolute: 0.4 10*3/uL (ref 0.1–1.0)
Monocytes Absolute: 0.6 10*3/uL (ref 0.1–1.0)
Monocytes Absolute: 0.8 10*3/uL (ref 0.1–1.0)
Monocytes Relative: 4 % (ref 3–12)
Monocytes Relative: 5 % (ref 3–12)
Monocytes Relative: 6 % (ref 3–12)
Neutro Abs: 7.3 10*3/uL (ref 1.7–7.7)

## 2010-04-12 LAB — BASIC METABOLIC PANEL
CO2: 26 mEq/L (ref 19–32)
CO2: 29 mEq/L (ref 19–32)
CO2: 29 mEq/L (ref 19–32)
Chloride: 102 mEq/L (ref 96–112)
Chloride: 104 mEq/L (ref 96–112)
Chloride: 106 mEq/L (ref 96–112)
GFR calc Af Amer: >60 mL/min (ref >60)
GFR calc Af Amer: >60 mL/min (ref >60)
GFR calc Af Amer: >60 mL/min (ref >60)
Glucose, Bld: 101 mg/dL — ABNORMAL HIGH (ref 70–99)
Glucose, Bld: 96 mg/dL (ref 70–99)
Potassium: 3.8 mEq/L (ref 3.5–5.1)
Potassium: 3.8 mEq/L (ref 3.5–5.1)
Sodium: 138 mEq/L (ref 135–145)
Sodium: 140 mEq/L (ref 135–145)

## 2010-04-12 LAB — HEPATIC FUNCTION PANEL
Bilirubin, Direct: 0.1 mg/dL (ref 0.0–0.3)
Indirect Bilirubin: 0.6 mg/dL (ref 0.3–0.9)
Total Bilirubin: 0.7 mg/dL (ref 0.3–1.2)

## 2010-04-12 LAB — CARDIAC PANEL(CRET KIN+CKTOT+MB+TROPI)
CK, MB: 1.7 ng/mL (ref 0.3–4.0)
Relative Index: INVALID (ref 0.0–2.5)
Relative Index: INVALID (ref 0.0–2.5)
Total CK: 35 U/L (ref 7–177)
Troponin I: 0.17 ng/mL — ABNORMAL HIGH (ref 0.00–0.06)

## 2010-04-12 LAB — PROTIME-INR
INR: 1.1 (ref 0.00–1.49)
INR: 1.2 (ref 0.00–1.49)

## 2010-04-12 LAB — APTT: aPTT: 26 seconds (ref 24–37)

## 2010-04-12 LAB — URINE CULTURE: Colony Count: 5000

## 2010-04-12 LAB — URINALYSIS, MICROSCOPIC ONLY
Bilirubin Urine: NEGATIVE
Glucose, UA: NEGATIVE mg/dL
Ketones, ur: NEGATIVE mg/dL
Nitrite: NEGATIVE
Protein, ur: 30 mg/dL — AB
pH: 6 (ref 5.0–8.0)

## 2010-04-12 LAB — GLUCOSE, CAPILLARY
Glucose-Capillary: 120 mg/dL — ABNORMAL HIGH (ref 70–99)
Glucose-Capillary: 86 mg/dL (ref 70–99)

## 2010-04-12 LAB — URINALYSIS, ROUTINE W REFLEX MICROSCOPIC
Bilirubin Urine: NEGATIVE
Glucose, UA: NEGATIVE mg/dL
Nitrite: NEGATIVE
Specific Gravity, Urine: 1.022 (ref 1.005–1.030)
pH: 6.5 (ref 5.0–8.0)

## 2010-04-12 LAB — URINE MICROSCOPIC-ADD ON

## 2010-04-12 LAB — LIPID PANEL
Cholesterol: 148 mg/dL (ref 0–200)
LDL Cholesterol: 96 mg/dL (ref 0–99)
VLDL: 14 mg/dL (ref 0–40)

## 2010-04-15 ENCOUNTER — Encounter: Payer: Worker's Compensation | Admitting: *Deleted

## 2010-04-16 ENCOUNTER — Emergency Department (HOSPITAL_COMMUNITY): Payer: Worker's Compensation

## 2010-04-16 ENCOUNTER — Emergency Department (HOSPITAL_COMMUNITY)
Admission: EM | Admit: 2010-04-16 | Discharge: 2010-04-16 | Disposition: A | Discharge status: Home / Self Care | Payer: Worker's Compensation | Attending: Emergency Medicine | Admitting: Emergency Medicine

## 2010-04-16 ENCOUNTER — Ambulatory Visit (INDEPENDENT_AMBULATORY_CARE_PROVIDER_SITE_OTHER): Payer: Worker's Compensation | Admitting: *Deleted

## 2010-04-16 DIAGNOSIS — Z79899 Other long term (current) drug therapy: Secondary | ICD-10-CM | POA: Insufficient documentation

## 2010-04-16 DIAGNOSIS — I10 Essential (primary) hypertension: Secondary | ICD-10-CM | POA: Insufficient documentation

## 2010-04-16 DIAGNOSIS — I82409 Acute embolism and thrombosis of unspecified deep veins of unspecified lower extremity: Secondary | ICD-10-CM

## 2010-04-16 DIAGNOSIS — M109 Gout, unspecified: Secondary | ICD-10-CM | POA: Insufficient documentation

## 2010-04-16 DIAGNOSIS — Z86718 Personal history of other venous thrombosis and embolism: Secondary | ICD-10-CM | POA: Insufficient documentation

## 2010-04-16 DIAGNOSIS — E785 Hyperlipidemia, unspecified: Secondary | ICD-10-CM | POA: Insufficient documentation

## 2010-04-16 DIAGNOSIS — M7989 Other specified soft tissue disorders: Secondary | ICD-10-CM | POA: Insufficient documentation

## 2010-04-16 DIAGNOSIS — K589 Irritable bowel syndrome without diarrhea: Secondary | ICD-10-CM | POA: Insufficient documentation

## 2010-04-16 DIAGNOSIS — M79609 Pain in unspecified limb: Secondary | ICD-10-CM | POA: Insufficient documentation

## 2010-04-16 LAB — CBC
HCT: 38.2 % (ref 36.0–46.0)
Hemoglobin: 12.2 g/dL (ref 12.0–15.0)
MCHC: 31.9 g/dL (ref 30.0–36.0)
WBC: 8.6 10*3/uL (ref 4.0–10.5)

## 2010-04-16 LAB — DIFFERENTIAL
Basophils Absolute: 0 10*3/uL (ref 0.0–0.1)
Lymphocytes Relative: 27 % (ref 12–46)
Lymphs Abs: 2.3 10*3/uL (ref 0.7–4.0)
Monocytes Absolute: 0.6 10*3/uL (ref 0.1–1.0)
Neutro Abs: 5.7 10*3/uL (ref 1.7–7.7)

## 2010-04-16 LAB — PROTIME-INR: INR: 1.19 (ref 0.00–1.49)

## 2010-04-20 ENCOUNTER — Ambulatory Visit: Payer: Self-pay | Admitting: Infectious Disease

## 2010-04-22 ENCOUNTER — Ambulatory Visit (INDEPENDENT_AMBULATORY_CARE_PROVIDER_SITE_OTHER): Payer: Worker's Compensation | Admitting: Infectious Disease

## 2010-04-22 ENCOUNTER — Telehealth: Payer: Self-pay | Admitting: *Deleted

## 2010-04-22 DIAGNOSIS — M7989 Other specified soft tissue disorders: Secondary | ICD-10-CM | POA: Insufficient documentation

## 2010-04-22 DIAGNOSIS — M009 Pyogenic arthritis, unspecified: Secondary | ICD-10-CM

## 2010-04-22 DIAGNOSIS — I10 Essential (primary) hypertension: Secondary | ICD-10-CM

## 2010-04-22 DIAGNOSIS — A4902 Methicillin resistant Staphylococcus aureus infection, unspecified site: Secondary | ICD-10-CM

## 2010-04-22 DIAGNOSIS — O2230 Deep phlebothrombosis in pregnancy, unspecified trimester: Secondary | ICD-10-CM

## 2010-04-22 DIAGNOSIS — T8489XA Other specified complication of internal orthopedic prosthetic devices, implants and grafts, initial encounter: Secondary | ICD-10-CM

## 2010-04-22 DIAGNOSIS — L659 Nonscarring hair loss, unspecified: Secondary | ICD-10-CM | POA: Insufficient documentation

## 2010-04-22 DIAGNOSIS — M109 Gout, unspecified: Secondary | ICD-10-CM | POA: Insufficient documentation

## 2010-04-22 DIAGNOSIS — I82409 Acute embolism and thrombosis of unspecified deep veins of unspecified lower extremity: Secondary | ICD-10-CM | POA: Insufficient documentation

## 2010-04-22 LAB — CBC WITH DIFFERENTIAL/PLATELET
HCT: 41.1 % (ref 36.0–46.0)
Hemoglobin: 13.1 g/dL (ref 12.0–15.0)
Lymphocytes Relative: 34 % (ref 12–46)
Monocytes Absolute: 0.8 10*3/uL (ref 0.1–1.0)
Monocytes Relative: 8 % (ref 3–12)
Neutro Abs: 6 10*3/uL (ref 1.7–7.7)
WBC: 10.5 10*3/uL (ref 4.0–10.5)

## 2010-04-22 LAB — PROTIME-INR: Prothrombin Time: 21.8 seconds — ABNORMAL HIGH (ref 11.6–15.2)

## 2010-04-22 LAB — BASIC METABOLIC PANEL
BUN: 13 mg/dL (ref 6–23)
Calcium: 9.2 mg/dL (ref 8.4–10.5)
Chloride: 104 mEq/L (ref 96–112)
Glucose, Bld: 89 mg/dL (ref 70–99)
Potassium: 4.1 mEq/L (ref 3.5–5.3)

## 2010-04-22 LAB — C-REACTIVE PROTEIN: CRP: 0.1 mg/dL (ref <0.6)

## 2010-04-22 LAB — SEDIMENTATION RATE: Sed Rate: 5 mm/hr (ref 0–22)

## 2010-04-22 LAB — URIC ACID: Uric Acid, Serum: 5.9 mg/dL (ref 2.4–7.0)

## 2010-04-22 NOTE — Assessment & Plan Note (Signed)
Agree with getting the Doppler to make sure she doesn't have a DVT. See above discussion regard regarding possibility of recurrence infection in her knee and also the concern for gout.

## 2010-04-22 NOTE — Assessment & Plan Note (Signed)
Blood pressure has been up certainly stopping the Toprol may work worsen her control she may need another drug such as Norvasc. We'll defer to Dr. Patty Sermons.

## 2010-04-22 NOTE — Patient Instructions (Addendum)
You may stop the metoprolol Continue the bactrim rtc in May

## 2010-04-22 NOTE — Assessment & Plan Note (Signed)
She was changed to doxycycline due to current concern that it might cause hair loss. He'll is has persisted. She also suspects the metoprolol. She could be taken off the metoprolol but will likely need another antihypertensive medication. I will defer this to Dr. Patty Sermons. Certainly Coumadin is also known to cause hair loss and this could be another reason for consideration of PraADXA

## 2010-04-22 NOTE — Assessment & Plan Note (Signed)
Diagnosis gout was made presumptively based a conical presentation. I will check a uric acid. Certainly it is still unclear it may be helpful to get an aspirate of the joint the joint fluid sent for cell count differential and crystal analysis as well as culture. One could consider changing her from a thiazide diuretic to another drug since this could be pursued and benefits which he has.

## 2010-04-22 NOTE — Assessment & Plan Note (Signed)
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 Have been considered in the past. I wonder if it might be a reasonable alternative for her

## 2010-04-22 NOTE — Assessment & Plan Note (Signed)
We are attempting to suppress her with Bactrim. As noted in prior notes MRSA had been isolated from a wound culture. Certainly would be a likely culprit here. I am bothered that she still has pain in her knee. I'm also bothers she has some tenderness. Her recent episode of swelling in her leg and ankle due to gout could have been due to a DVT but could also be due recurrence of her infection. We'll recheck a sedimentation rate and C-reactive protein today and continue her on suppressive Bactrim for now. Attending is Dr. Tyler Aas is a tag white blood cell scan.

## 2010-04-22 NOTE — Telephone Encounter (Signed)
Just checking the options for outgoing calls NOT A REAL PHONE CALL

## 2010-04-22 NOTE — Assessment & Plan Note (Signed)
Resumed culprit based on wound culture ultra operative cultures were all negative.

## 2010-04-22 NOTE — Progress Notes (Signed)
Subjective:    Patient ID: Mia [name], female    DOB: 1955/04/12, 55 y.o.   MRN: [id number]  HPI  55 year old with recurrent prosthetic knee infection with MRSA sp repeat I and D in January with exchange of polyethylene and washout by Dr Ranell Patrick. She was being treated with daptomycin due to concern about a rxn to vancomycin. During her stay she developed allergic rash on zosyn and cefepime. She was on the daptomycin and developed a PICC line recenlty >N infection with e coli and klebsiella and was treated with cipro which she is still finshing. Patients is having more pain after PT. She is now having pain behind the knee and behind the knee. She was seen in the ED due to swelling in entire leg roughly two weeks ago. Her pain was so severe that she could not tolerate the water in her shower touching her toe. Dr. Hyacinth Meeker gave her  She was  empirically for gout with colchicine and prednisone. Leg pain and swelling improved. She still has persistent knee pain however and has been seen by Dr. Ranell Patrick who ordered doppler of her leg and is considering tagged wbc. While her knee has been warm she has had not had fevers chills nausea or malaise. She is continuing to have hair loss for which I had changed her from doxycyline to bactrim. She had also been started on metoprolol by the house staff at that time. We can also try her off hte metoprolol. Coumadin can also cause hair loss. She has had difficulties with keeping the INR up and pradaxa has been considered. I spent over 45 minutes with the patient including greater than 50% of time in counselling and coordination of care.     Review of Systems As per history history of present illness otherwise 12 point review of systems is negative.    Objective:   Physical Exam    patient is alert and oriented x4. HEENT is normocephalic atraumatic her pupils are equal round react to light oropharynx is clear her chest exam regular rate rhythm no murmurs or rubs  heard lungs clear to auscultation bilaterally abdomen soft nondistended muscle skeletal examination her right knee has a surgical scar is well-healed his slightly warm to touch. There is some tenderness to palpation of this area that is more than who expects. There is no obvious fluctuance about the knee. Her right ankle has some minimal tenderness to palpation as is her MTP joint of her big toe. This area was apparently much more tender when she'd been seen in the emergency department. She does have some bilateral 2+ edema in both legs. Neurological exam is nonfocal. Psychiatric she is slightly dysphoric but understandably stressed all the multiple palpitations at she's gone through during this past year.    Assessment & Plan:  PROSTHETIC JOINT COMPLICATION We are attempting to suppress her with Bactrim. As noted in prior notes MRSA had been isolated from a wound culture. Certainly would be a likely culprit here. I am bothered that she still has pain in her knee. I'm also bothers she has some tenderness. Her recent episode of swelling in her leg and ankle due to gout could have been due to a DVT but could also be due recurrence of her infection. We'll recheck a sedimentation rate and C-reactive protein today and continue her on suppressive Bactrim for now. Attending is Dr. Tyler Aas is a tag white blood cell scan.  METHICILLIN RESISTANT STAPHYLOCOCCUS AUREUS INFECTION Resumed culprit based on  wound culture ultra operative cultures were all negative.  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 Have been considered in the past. I wonder if it might be a reasonable alternative for her  Hair loss She was changed to doxycycline due to current concern that it might cause hair loss. He'll is has persisted. She also suspects the metoprolol. She could be taken off the  metoprolol but will likely need another antihypertensive medication. I will defer this to Dr. Patty Sermons. Certainly Coumadin is also known to cause hair loss and this could be another reason for consideration of PraADXA  Essential hypertension Blood pressure has been up certainly stopping the Toprol may work worsen her control she may need another drug such as Norvasc. We'll defer to Dr. Patty Sermons.  Gout Diagnosis gout was made presumptively based a conical presentation. I will check a uric acid. Certainly it is still unclear it may be helpful to get an aspirate of the joint the joint fluid sent for cell count differential and crystal analysis as well as culture. One could consider changing her from a thiazide diuretic to another drug since this could be pursued and benefits which he has.  Leg swelling Agree with getting the Doppler to make sure she doesn't have a DVT. See above discussion regard regarding possibility of recurrence infection in her knee and also the concern for gout.

## 2010-04-23 ENCOUNTER — Telehealth: Payer: Self-pay | Admitting: Cardiology

## 2010-04-23 ENCOUNTER — Encounter: Payer: Worker's Compensation | Admitting: *Deleted

## 2010-04-23 ENCOUNTER — Other Ambulatory Visit (HOSPITAL_COMMUNITY): Payer: Self-pay | Admitting: Orthopedic Surgery

## 2010-04-23 DIAGNOSIS — M25561 Pain in right knee: Secondary | ICD-10-CM

## 2010-04-23 NOTE — Telephone Encounter (Signed)
Refill of Hydochlordizine. [facility] on [address] [id number]. Please call back.

## 2010-04-24 ENCOUNTER — Ambulatory Visit (INDEPENDENT_AMBULATORY_CARE_PROVIDER_SITE_OTHER): Payer: Worker's Compensation | Admitting: *Deleted

## 2010-04-24 ENCOUNTER — Encounter: Payer: Self-pay | Admitting: Nurse Practitioner

## 2010-04-24 ENCOUNTER — Ambulatory Visit (INDEPENDENT_AMBULATORY_CARE_PROVIDER_SITE_OTHER): Payer: Worker's Compensation | Admitting: Nurse Practitioner

## 2010-04-24 VITALS — BP 130/80 | HR 88 | Wt 330.0 lb

## 2010-04-24 DIAGNOSIS — I10 Essential (primary) hypertension: Secondary | ICD-10-CM

## 2010-04-24 DIAGNOSIS — I82409 Acute embolism and thrombosis of unspecified deep veins of unspecified lower extremity: Secondary | ICD-10-CM

## 2010-04-24 DIAGNOSIS — T8489XA Other specified complication of internal orthopedic prosthetic devices, implants and grafts, initial encounter: Secondary | ICD-10-CM

## 2010-04-24 DIAGNOSIS — L659 Nonscarring hair loss, unspecified: Secondary | ICD-10-CM

## 2010-04-24 DIAGNOSIS — I2699 Other pulmonary embolism without acute cor pulmonale: Secondary | ICD-10-CM | POA: Insufficient documentation

## 2010-04-24 DIAGNOSIS — Z7901 Long term (current) use of anticoagulants: Secondary | ICD-10-CM

## 2010-04-24 MED ORDER — WARFARIN SODIUM 5 MG PO TABS: ORAL_TABLET | ORAL | Status: DC

## 2010-04-24 NOTE — Progress Notes (Signed)
Mia [name] Date of Birth: 1955-12-15   History of Present Illness: Mia [name] is seen today for a follow up visit. She is seen for Dr. Patty Sermons. She continues to have difficulties with her right knee. It is more swollen, red and warm to touch. She remains on antibiotics. She is for an ultrasound on Monday. She is for a tagged WBC scan. She has had difficulties maintaining her INR level. She is also complaining of hair loss. ID is wondering if she could go to Pradaxa for her anticogulation. She does not check her temperature at home. She does feel hot occasionally. She will have mild chest discomfort on occasion. She does not check her blood pressure at home. She also reports that she is no longer taking metoprolol. She ran out of her HCTZ about 1 week ago. Her blood pressure is good here today. She is intolerant to Norvasc.   Current Outpatient Prescriptions on File Prior to Visit  Medication Sig Dispense Refill  . acetaminophen (TYLENOL ARTHRITIS PAIN) 650 MG CR tablet Take 650 mg by mouth every 6 (six) hours as needed.        . ezetimibe (ZETIA) 10 MG tablet Take 10 mg by mouth daily.        . furosemide (LASIX) 40 MG tablet 1 daily as needed  30 tablet  11  . methocarbamol (ROBAXIN) 500 MG tablet Take 500 mg by mouth every 6 (six) hours as needed. As needed for muscle spasm       . oxycodone (OXYCONTIN) 30 MG TB12 Take 30 mg by mouth every 12 (twelve) hours.        . pantoprazole (PROTONIX) 40 MG tablet Take 40 mg by mouth daily.        . polyethylene glycol (MIRALAX) powder Take 17 g by mouth. Use as directed for constipation       . pravastatin (PRAVACHOL) 40 MG tablet Take 40 mg by mouth daily.        Marland Kitchen telmisartan (MICARDIS) 80 MG tablet Take 80 mg by mouth daily.        Marland Kitchen warfarin (COUMADIN) 10 MG tablet Take by mouth daily. 5mg  5days and 2.5mg  for 2days      . oxyCODONE-acetaminophen (PERCOCET) 5-325 MG per tablet Take 2 tablets by mouth every 4 (four) hours as needed.          . sulfamethoxazole-trimethoprim (BACTRIM DS) 800-160 MG per tablet Take 1 tablet by mouth 2 (two) times daily.  60 tablet  3  . sulfamethoxazole-trimethoprim (BACTRIM DS) 800-160 MG per tablet Take 1 tablet by mouth 2 (two) times daily.  60 tablet  1  . DISCONTD: hydrochlorothiazide 25 MG tablet TAKE ONE-HALF TABLET BY MOUTH EVERY DAY  90 tablet  3    Allergies  Allergen Reactions  . Amlodipine     Edema,ha  . Crestor (Rosuvastatin Calcium)     Chest pain  . Zosyn     Past Medical History  Diagnosis Date  . Hypertension   . Morbid obesity   . Nonsmoker   . Pulmonary embolism   . DVT (deep venous thrombosis)   . Chronic anticoagulation   . Infected prosthetic knee joint   . H/O: GI bleed   . Anemia   . IBS (irritable bowel syndrome)     Past Surgical History  Procedure Date  . Right knee replacement 2010    History  Smoking status  . Never Smoker   Smokeless tobacco  . Never Used  History  Alcohol Use No    History reviewed. No pertinent family history.  Review of Systems: The review of systems is as above. She thinks she has had a flare up of gout since her last visit here. She has more swelling in the right leg and it is painful. She is on more pain medicine.  She feels more stressed out with everything going on with her. All other systems were reviewed and are negative.  Physical Exam: BP 130/80  Pulse 88  Wt 330 lb (149.687 kg)  LMP 04/10/2010 Patient is a morbidly obese black female who is pleasant and in no acute distress. Skin is warm and dry. Color is normal.  HEENT is unremarkable. Normocephalic/atraumatic. PERRL. Sclera are nonicteric. Neck is supple. No masses. No JVD. Lungs are clear. Cardiac exam shows a regular rate and rhythm. Heart tones are distant.  Abdomen is soft and obese. Extremities are with edema, right greater than left. The right leg is warmer to touch and looks red to me. Gait and ROM are intact. No gross neurologic deficits noted.  She is using a cane.   LABORATORY DATA:  INR today is therapeutic at 2.3. (She was 1.8 just 2 days ago).    Assessment / Plan:

## 2010-04-24 NOTE — Assessment & Plan Note (Signed)
Her INR is good today. She is to have an ultrasound on Monday. We need to try and maintain her INR in the 2 to 3 range consistently.

## 2010-04-24 NOTE — Assessment & Plan Note (Signed)
She has been out of the HCTZ for at least one week. Blood pressure looks ok here today. She is on Lasix. We will keep her on this regimen. She is not able to take Norvasc. Samples of her Micardis are given today.

## 2010-04-24 NOTE — Assessment & Plan Note (Signed)
She continues to complain. I will discuss with Dr. Patty Sermons whether he feels like it could be the coumadin. For now, it will be continued.

## 2010-04-24 NOTE — Patient Instructions (Addendum)
Do not get back on the HCTZ for now. Stay on your Lasix for the swelling and for your blood pressure. We are going to check your coumadin level today. I will talk to Dr. Patty Sermons about switching you to Pradaxa. Watch your salt.

## 2010-04-24 NOTE — Assessment & Plan Note (Signed)
She remains on chronic antibiotic therapy per ID.

## 2010-04-24 NOTE — Assessment & Plan Note (Signed)
Her INR is good today, but historically, her INR's have not been able to be maintained. ID has considered Pradaxa. I have talked with the pharmacist here today at the office. It is still not FDA approved for using in patients with DVT/PE but the research has shown good results. I will defer to Dr. Patty Sermons. Further disposition to follow. She will have her coumadin checked again in 1 week.

## 2010-04-25 NOTE — Discharge Summary (Signed)
  Mia [name], Mia [name]            ACCOUNT NO.:  [id number]  MEDICAL RECORD NO.:  [id number]           PATIENT TYPE:  E  LOCATION:  [facility]                         FACILITY:  [facility]  PHYSICIAN:  Leonides Grills, M.D.     DATE OF BIRTH:  1955-05-26  DATE OF ADMISSION:  04/16/2010 DATE OF DISCHARGE:  04/16/2010                        DISCHARGE SUMMARY - REFERRING   ADMITTING DIAGNOSES: 1. Status post right total knee arthroplasty with subsequent incision     and drainage December 26, 2009, admitted for increased pain,     erythema, and warmth. 2. Hyperlipidemia. 3. Hypertension. 4. Irritable bowel. 5. History of deep vein thrombosis. 6. Morbid obesity.  DISCHARGE DIAGNOSES: 1. Status post peripherally inserted central catheter line placement     for intravenous antibiotics. 2. Status post right total knee with wound dehiscence and infection,     status post incision and drainage. 3. Hyperlipidemia. 4. Hypertension. 5. Irritable bowel. 6. History of deep vein thrombosis. 7. Morbid obesity.  HISTORY OF PRESENT ILLNESS:  Mia [name] is a 55 year old female status post total knee arthroplasty in November 14, 2009, who presents today with right knee pain, redness and reported "plus drainage."  The patient underwent I&D of the knee by Dr. Ranell Patrick on December 26, 2009.  Currently on no antibiotics.  Admitted for PICC line placement and IV antibiotics.  HOSPITAL COURSE:  The patient admitted for PICC line placement and IV antibiotics only.  CBC was ordered.  White count 8400, hemoglobin 12.1, hematocrit 37.4, and platelets were 273,000.  ESR was high at 26.  Chest x-ray status post PICC line placement showed right upper extremity PICC line terminates approximately 2 cm below the cavoatrial junction in the right atrium.  Pulmonary vascular congestion and mild bibasilar atelectasis noted.  MEDICATIONS ON FLOOR: 1. Zetia. 2. Pravastatin. 3. Potassium chloride. 4. Pantoprazole. 5.  Oxycodone. 6. Furosemide. 7. Aspirin. 8. Hydrochlorothiazide.  DISPOSITION:  The patient was discharged to home after less than 24 hours of stay.  ACTIVITY:  Weightbearing as tolerated.  WOUND CARE:  Moisturized dressing changes daily.  FOLLOWUP:  The patient should follow up with Dr. Ranell Patrick on January 06, 2010, call [phone] for appointment.  SPECIAL INSTRUCTIONS:  The patient to add aspirin 325 mg p.o. b.i.d.     [name], P.A.   ______________________________ Leonides Grills, M.D.    GC/MEDQ  D:  04/20/2010  T:  04/20/2010  Job:  [id number]  cc:   Leonides Grills, M.D. Fax: [phone]  Electronically Signed by [name] P.A. on 04/22/2010 01:39:04 PM Electronically Signed by Leonides Grills M.D. on 04/25/2010 07:52:20 AM

## 2010-04-28 ENCOUNTER — Encounter (HOSPITAL_COMMUNITY)
Admission: RE | Admit: 2010-04-28 | Discharge: 2010-04-28 | Disposition: A | Discharge status: Home / Self Care | Payer: Worker's Compensation | Source: Ambulatory Visit | Attending: Orthopedic Surgery | Admitting: Orthopedic Surgery

## 2010-04-28 ENCOUNTER — Encounter (HOSPITAL_COMMUNITY): Admission: RE | Admit: 2010-04-28 | Payer: Worker's Compensation | Source: Ambulatory Visit

## 2010-04-28 DIAGNOSIS — M25561 Pain in right knee: Secondary | ICD-10-CM

## 2010-05-04 ENCOUNTER — Ambulatory Visit (INDEPENDENT_AMBULATORY_CARE_PROVIDER_SITE_OTHER): Payer: Worker's Compensation | Admitting: Cardiology

## 2010-05-04 ENCOUNTER — Ambulatory Visit (INDEPENDENT_AMBULATORY_CARE_PROVIDER_SITE_OTHER): Payer: Worker's Compensation | Admitting: *Deleted

## 2010-05-04 ENCOUNTER — Encounter: Payer: Self-pay | Admitting: Cardiology

## 2010-05-04 VITALS — BP 118/84 | HR 66 | Wt 329.0 lb

## 2010-05-04 DIAGNOSIS — I82409 Acute embolism and thrombosis of unspecified deep veins of unspecified lower extremity: Secondary | ICD-10-CM

## 2010-05-04 DIAGNOSIS — M7989 Other specified soft tissue disorders: Secondary | ICD-10-CM

## 2010-05-04 DIAGNOSIS — I10 Essential (primary) hypertension: Secondary | ICD-10-CM

## 2010-05-04 LAB — POCT INR: INR: 2

## 2010-05-04 NOTE — Assessment & Plan Note (Signed)
This patient has a past history of deep vein thrombosis.  Recently she's had no new acute symptoms.  She continues on warfarin.  Her INR has been in the therapeutic range on April 20 as well as today.  She is presently on Septra for treatment of her prosthetic joint infection.

## 2010-05-04 NOTE — Assessment & Plan Note (Signed)
Patient has a past history of essential hypertension and is exacerbated by her exogenous obesity.  She has not been expressing any exertional chest pain or increased exertional dyspnea.  She does have limited mobility because of her leg problem she has had some nocturnal chest discomfort which is consistent with GERD.  She is already on proton pump inhibitor and we will have her add p.r.n. Mylanta

## 2010-05-04 NOTE — Progress Notes (Signed)
Mia [name] Date of Birth:  09/11/1955 [facility] [address]   [address] [place], [place]  [address] [phone]           Fax   [phone]  HPI: This pleasant 55 year old woman is seen back for a scheduled followup office visit.  She has a past history of essential hypertension and exogenous obesity.  Also has a history of hypercholesterolemia.  She has had previous right knee surgery complicated by recurrent deep vein thrombosis.  She is on long-term Coumadin.  Since last visit she has noted that the Lasix has helped eliminate the peripheral edema at the present time physical therapy is on hold because the bicycle exercises appeared to her to be increasing her chronic discomfort in the right knee.  She continues to be followed closely by her orthopedist Dr. Ranell Patrick.  She is also under the care of Dr. Algis Liming from the infectious disease service.  Current Outpatient Prescriptions  Medication Sig Dispense Refill  . acetaminophen (TYLENOL ARTHRITIS PAIN) 650 MG CR tablet Take 650 mg by mouth every 6 (six) hours as needed.        . ezetimibe (ZETIA) 10 MG tablet Take 10 mg by mouth daily.        . furosemide (LASIX) 40 MG tablet 1 daily as needed  30 tablet  11  . methocarbamol (ROBAXIN) 500 MG tablet Take 500 mg by mouth every 6 (six) hours as needed. As needed for muscle spasm       . oxyCODONE-acetaminophen (PERCOCET) 5-325 MG per tablet Take 2 tablets by mouth every 4 (four) hours as needed.        . polyethylene glycol (MIRALAX) powder Take 17 g by mouth. Use as directed for constipation       . pravastatin (PRAVACHOL) 40 MG tablet Take 40 mg by mouth daily.        . Sulfamethoxazole-Trimethoprim (SEPTRA PO) Take by mouth.        . telmisartan (MICARDIS) 80 MG tablet Take 80 mg by mouth daily.        Marland Kitchen warfarin (COUMADIN) 5 MG tablet Take as directed by Anticoagulation clinic Pt takes up to 1 1/2 tablets daily  45 tablet  2  . oxycodone (OXYCONTIN) 30  MG TB12 Take 30 mg by mouth every 12 (twelve) hours. Taking as needed      . pantoprazole (PROTONIX) 40 MG tablet Take 40 mg by mouth daily.        Marland Kitchen sulfamethoxazole-trimethoprim (BACTRIM DS) 800-160 MG per tablet Take 1 tablet by mouth 2 (two) times daily.  60 tablet  3  . sulfamethoxazole-trimethoprim (BACTRIM DS) 800-160 MG per tablet Take 1 tablet by mouth 2 (two) times daily.  60 tablet  1    Allergies  Allergen Reactions  . Amlodipine     Edema,ha  . Crestor (Rosuvastatin Calcium)     Chest pain  . Zosyn     Patient Active Problem List  Diagnoses  . METHICILLIN RESISTANT STAPHYLOCOCCUS AUREUS INFECTION  . KLEBSIELLA PNEUMONIAE INFECTION  . BACTEREMIA  . PROSTHETIC JOINT COMPLICATION  . Essential hypertension  . DVT (deep venous thrombosis)  . DVT (deep venous thrombosis)  . Hair loss  . Gout  . Leg swelling  . HTN (hypertension)  . Chronic anticoagulation  . Other pulmonary embolism and infarction    History  Smoking status  . Never Smoker   Smokeless tobacco  . Never Used    History  Alcohol Use No    No family history on file.  Review of Systems: The patient denies any heat or cold intolerance.  No weight gain or weight loss.  The patient denies headaches or blurry vision.  There is no cough or sputum production.  The patient denies dizziness.  There is no hematuria or hematochezia.  The patient denies any muscle aches or arthritis.  The patient denies any rash.  The patient denies frequent falling or instability.  There is no history of depression or anxiety.  All other systems were reviewed and are negative.   Physical Exam: Filed Vitals:   05/04/10 1151  BP: 118/84  Pulse: 66  Weight is 329, down 1 pound.Pupils equal and reactive.   Extraocular Movements are full.  There is no scleral icterus.  The mouth and pharynx are normal.  The neck is supple.  The carotids reveal no bruits.  The jugular venous pressure is normal.  The thyroid is not enlarged.   There is no lymphadenopathy.The chest is clear to percussion and auscultation. There are no rales or rhonchi. Expansion of the chest is symmetrical.The precordium is quiet.  The first heart sound is normal.  The second heart sound is physiologically split.  There is no murmur gallop rub or click.  There is no abnormal lift or heave.  Abdomen is obese and nontender.  Extremities reveal no pitting edema.  She still has some warmth over the right knee incision.  There is no clinical evidence of any active DVT at this time.    Assessment / Plan: Her INR today is 2.0 and she will remain on same Coumadin.  We discussed with her the possibility of switching to something like Pradaxa or Xarelto but neither of those has an indication at this time for treatment of DVT.  The Xarelto is indicated for prevention of DVT And the Pradaxa is indicated for atrial fibrillation.

## 2010-05-04 NOTE — Assessment & Plan Note (Signed)
The patient has noted improvement in her chronic lower leg edema since going on daily furosemide.

## 2010-05-05 ENCOUNTER — Other Ambulatory Visit: Payer: Self-pay | Admitting: *Deleted

## 2010-05-05 ENCOUNTER — Encounter: Payer: Self-pay | Admitting: *Deleted

## 2010-05-05 DIAGNOSIS — E7800 Pure hypercholesterolemia, unspecified: Secondary | ICD-10-CM

## 2010-05-08 ENCOUNTER — Other Ambulatory Visit (HOSPITAL_COMMUNITY): Payer: Self-pay | Admitting: Orthopedic Surgery

## 2010-05-08 DIAGNOSIS — M25461 Effusion, right knee: Secondary | ICD-10-CM

## 2010-05-08 DIAGNOSIS — I879 Disorder of vein, unspecified: Secondary | ICD-10-CM

## 2010-05-08 DIAGNOSIS — M25561 Pain in right knee: Secondary | ICD-10-CM

## 2010-05-12 ENCOUNTER — Other Ambulatory Visit (HOSPITAL_COMMUNITY): Payer: Self-pay | Admitting: Orthopedic Surgery

## 2010-05-12 ENCOUNTER — Ambulatory Visit (HOSPITAL_COMMUNITY)
Admission: RE | Admit: 2010-05-12 | Discharge: 2010-05-12 | Disposition: A | Discharge status: Home / Self Care | Payer: Worker's Compensation | Source: Ambulatory Visit | Attending: Orthopedic Surgery | Admitting: Orthopedic Surgery

## 2010-05-12 ENCOUNTER — Ambulatory Visit (HOSPITAL_COMMUNITY): Payer: Self-pay

## 2010-05-12 ENCOUNTER — Encounter (HOSPITAL_COMMUNITY)
Admission: RE | Admit: 2010-05-12 | Discharge: 2010-05-12 | Disposition: A | Discharge status: Home / Self Care | Payer: Worker's Compensation | Source: Ambulatory Visit | Attending: Orthopedic Surgery | Admitting: Orthopedic Surgery

## 2010-05-12 DIAGNOSIS — I879 Disorder of vein, unspecified: Secondary | ICD-10-CM

## 2010-05-12 DIAGNOSIS — M25561 Pain in right knee: Secondary | ICD-10-CM

## 2010-05-12 DIAGNOSIS — Z452 Encounter for adjustment and management of vascular access device: Secondary | ICD-10-CM | POA: Insufficient documentation

## 2010-05-12 DIAGNOSIS — M25461 Effusion, right knee: Secondary | ICD-10-CM

## 2010-05-12 DIAGNOSIS — R609 Edema, unspecified: Secondary | ICD-10-CM | POA: Insufficient documentation

## 2010-05-12 DIAGNOSIS — M25569 Pain in unspecified knee: Secondary | ICD-10-CM | POA: Insufficient documentation

## 2010-05-12 DIAGNOSIS — Z96659 Presence of unspecified artificial knee joint: Secondary | ICD-10-CM | POA: Insufficient documentation

## 2010-05-12 MED ORDER — TECHNETIUM TC 99M EXAMETAZIME IV KIT
13.0000 | PACK | Freq: Once | INTRAVENOUS | Status: AC | PRN
  Administered 2010-05-12: 13 via INTRAVENOUS

## 2010-05-18 ENCOUNTER — Telehealth: Payer: Self-pay | Admitting: Cardiology

## 2010-05-18 NOTE — Telephone Encounter (Signed)
CALL PT, SHE SAID IS HAVING LEFT SIDED PAIN, NOT SURE IT IS HER KIDNEYS.

## 2010-05-18 NOTE — Telephone Encounter (Signed)
Try Tylenol for now.  Watch for any sign of herpes zoster blisters

## 2010-05-18 NOTE — Telephone Encounter (Signed)
Pain started on left side on Thursday.  Sore to touch, denies any blisters.  Did have a bruise, but that has faded.  Hurts with movement.  Sharp pains that radiate.  Pain starts in the side and goes around to back. Denies injury.  Please advise

## 2010-05-19 NOTE — Op Note (Signed)
Mia [name], Mia [name]            ACCOUNT NO.:  [id number]   MEDICAL RECORD NO.:  [id number]          PATIENT TYPE:  AMB   LOCATION:  SDC                           FACILITY:  [facility]   PHYSICIAN:  Dineen Kid. Rana Snare, M.D.    DATE OF BIRTH:  1955-04-11   DATE OF PROCEDURE:  10/06/2006  DATE OF DISCHARGE:                               OPERATIVE REPORT   PREOPERATIVE DIAGNOSIS:  Abnormal uterine bleeding, endometrial mass on  saline infusion ultrasound and fibroids.   POSTOPERATIVE DIAGNOSIS:  Abnormal uterine bleeding, endometrial mass on  saline infusion ultrasound and fibroids.   PROCEDURE:  Hysteroscopy, D&C.   SURGEON:  Dineen Kid. Rana Snare, M.D.   ANESTHESIA:  Local with paracervical block, originally with monitored  anesthetic care, converting to the general by LMA.   INDICATIONS:  Ms. [name] is a 55 year old who presented with abnormal  uterine bleeding and menorrhagia.  She underwent saline infusion  ultrasound showing multiple large fibroids but also several small areas  within the endometrial cavity between fibroids consistent with polyps or  thickening of tissue.  She was offered D&C, NovaSure ablation or  hysterectomy.  She chooses only the D&C at this time.  The risks and  benefits of the procedure were discussed at length which include but are  not limited to the risks of infection, bleeding, damage to the uterus,  tubes, ovaries, bowel or bladder; the risks associated with anesthesia,  blood transfusion, the possibility that this may not alleviate the  bleeding, it could recur, it could worsen.  She does give her informed  consent and wished to proceed.   FINDINGS AT THE TIME OF SURGERY:  Multiple endometrial thickenings  consistent with small polyps, also several projections into the  endometrial cavity consistent with submucosal fibroids.  Otherwise,  normal-appearing ostia and cervix.   DESCRIPTION OF PROCEDURE:  After adequate analgesia, the patient was  placed in the  dorsal lithotomy position.  She was sterilely prepped and  draped.  The bladder was sterilely drained.  A Graves speculum was  placed.  Tenaculum placed on the anterior lip of the cervix.  A  paracervical block was placed with 1% Xylocaine with 1:100,000  epinephrine buffered with bicarbonate, a total of 20 mL used.  The  uterus sounded to 12 cm.  It easily dilated to a #27 Pratt dilator.  The  hysteroscope was inserted, and the above findings were noted.  Curettage  was performed, retrieving small fragments of endometrium and questions  of small endometrial polyps.  This was performed until a gritty surface  was felt throughout the endometrial cavity.  I could also palpate the  submucosal fibroids with the curette.  Reexamination with the  hysteroscope at this time revealed no residual thickening of the  endometrium or polypoid tissue.  There were several undulations  consistent with submucosal fibroids.  Otherwise, normal-appearing ostia  and cervix.  The hysteroscope was then removed.  The tenaculum was  removed from the anterior lip of the cervix and was noted be hemostatic.  The speculum was then removed.   The patient was transferred to the recovery room  in stable condition.  Sponge and instrument count was normal x3.  Estimated blood loss was  minimal.  Sorbitol deficit was 200 mL, the majority of that was  accounted for by the drapes on the operative site.  The patient received  1 gram of cefotetan preoperatively and 30 mg of Toradol postoperatively.   DISPOSITION:  The patient will be discharged home and will follow up in  the office in 2-3 weeks and with a routine instruction sheet for D&C.  Told to return for increased pain, fever or bleeding.      Dineen Kid Rana Snare, M.D.  Electronically Signed     DCL/MEDQ  D:  10/06/2006  T:  10/06/2006  Job:  [id number]

## 2010-05-19 NOTE — Discharge Summary (Signed)
[name], [name]            ACCOUNT NO.:  [id number]   MEDICAL RECORD NO.:  [id number]          PATIENT TYPE:  INP   LOCATION:  2028                         FACILITY:  [facility]   PHYSICIAN:  Theodosia Paling, MD    DATE OF BIRTH:  07/23/1955   DATE OF ADMISSION:  07/31/2008  DATE OF DISCHARGE:  08/05/2008                               DISCHARGE SUMMARY   PRIMARY CARE PHYSICIAN:  Cassell Clement, MD  This is the new PCP that our case manager have established for the  patient.   ADMITTING HISTORY:  Please refer to the excellent admission note  dictated by Dr. Pearson Grippe under history of present illness.   DISCHARGE PRIMARY DIAGNOSES:  1. Newly diagnosed bilateral extensive pulmonary embolism.  2. Right tibial vein deep venous thrombosis.  3. Gastrointestinal bleed.   SECONDARY DIAGNOSIS:  History of hypertension.   DISCHARGE MEDICATIONS:  1. Coreg 6.25 mg p.o. q.12 h.  2. Famotidine 20 mg p.o. q.12 h.  3. Lovenox 150 subcu q.12 h.  4. Coumadin 5 mg p.o. daily for INR target of 2-3.   HOSPITAL COURSE:  Following issues were addressed during the  hospitalization:  1. Bilateral pulmonary embolism:  The patient had chest pain, and      therefore, CT was done which showed extensive bilateral PE.  She      had a distal DVT as well found on the venous duplex of the right      tibial vein.  The patient is currently on Lovenox and Coumadin.      She had a transient GI bleed episode, which was most likely      hemorrhoidal in origin.  The patient will be following up with Dr.      Patty Sermons as an outpatient, and home health care has been      established for INR monitoring.  At this time, the patient has no      evidence of active bleed.  She tolerated Coumadin very well;      however, PT and OT consult was done, who recommended a walker, and      the patient will be getting home health and physical therapy as      well.  2. Hypertension:  The patient's blood pressure is  relatively      controlled with Coreg.  3. History of urinary tract infection:  The patient's urine culture      was negative.  Therefore, the patient initially received      ciprofloxacin, which was subsequently stopped given negative urine      culture.  4. Gastrointestinal bleed:  The patient presented with bright red      bleed that prompted the patient to come to the ER.  Essentially, a      general GI consult was obtained by Dr. Randa Evens, whose impression      was also that it was hemorrhoidal bleed.  The patient had only 1      episode of the pain, and therefore, no further procedure was done,      also because the patient is on Lovenox and  Coumadin and had acute      PEs, further workup was deferred.  The patient's H and H and blood      pressure stayed stable, and she is asymptomatic currently.  The      patient also had a dyspepsia, and GI impression was that it was      because of Robaxin.  The patient will be going home on famotidine.   PROCEDURES PERFORMED:  None.   CONSULTATIONS PERFORMED:  As mentioned under hospital course.   IMAGING PERFORMED:  As mentioned under hospital course.   DISPOSITION:  1. The patient is going to go home with home PT, OT.  Home health will      be adjusting INR, target INR of 2-3.  She will follow up with Dr.      Patty Sermons on August 29, 2008, at 3:15 p.m. in this clinic on      Thursday.  2. The patient will likely need an outpatient colonoscopy in the next      few months; however, it is not urgently indicated at this time.      The patient will continue Coumadin and famotidine and      antihypertensives as outlined above.   Total time spent in discharge of this patient is around 1 hour.      Theodosia Paling, MD  Electronically Signed     NP/MEDQ  D:  08/05/2008  T:  08/06/2008  Job:  [id number]   cc:   Cassell Clement, M.D.

## 2010-05-19 NOTE — Telephone Encounter (Signed)
Advised patient of below.  Contact us right away if any blisters

## 2010-05-19 NOTE — Consult Note (Signed)
Mia [name], CEDERBERG            ACCOUNT NO.:  [id number]   MEDICAL RECORD NO.:  [id number]          PATIENT TYPE:  INP   LOCATION:  2028                         FACILITY:  [facility]   PHYSICIAN:  Fayrene Fearing L. Malon Kindle., M.D.DATE OF BIRTH:  Oct 28, 1955   DATE OF CONSULTATION:  08/01/2008  DATE OF DISCHARGE:                                 CONSULTATION   REFERRING PHYSICIAN:  Massie Maroon, MD   REASON FOR CONSULTATION:  GI bleeding.   PRIMARY CARE PHYSICIAN:  None.   PRIMARY GASTROENTEROLOGIST:  None.  This is an unassigned patient.   HISTORY:  The patient is a 55 year old African American female who had a  right knee arthroscopy on July 13, 2008.  She is chronically  constipated.  She woke this morning with bright red blood per rectum and  vague abdominal cramps.  She is normally constipated and does not have  diarrhea.  She presented to the emergency room with these symptoms.  Due  to the sudden onset and some pain at the site of her right knee  arthroscopy, a lower extremity Doppler was performed which showed a DVT  in the right leg.  The patient notes that she is normally constipated,  never has diarrhea.  She has had colonoscopies in the past, the last  being about 10 years ago which was done due to her chronic constipation.  She is not sure of who did it.  She had been using some ibuprofen and  has occasionally had dark stool, occasionally has heartburn, takes Tums  occasionally.  None of this has been particularly bad recently.   The patient was admitted to the hospital and due to some shortness of  breath and chest pain had a chest CT angiogram done which showed  extensive bilateral pulmonary emboli.  She is currently on Lovenox and  Coumadin.  Her chest pain is better.  She has not had any further rectal  bleeding.   MEDICATIONS ON ADMISSION:  Robaxin, Cipro, aspirin, and oxycodone.   ALLERGIES:  The patient denies any drug allergies.   MEDICAL HISTORY:  History of  hypertension.  She has had a recent UTI,  has been on Cipro.  Surgeries include a cholecystectomy in the distant  past, right knee arthroscopy with removal of meniscus on July 26, 2008,  just 7 days ago.  She also had a chondroplasty done to the bone at that  time.  She has had a distant cholecystectomy and a distant tubal  ligation.   SOCIAL HISTORY:  She does not smoke or drink.  Works for [employer].   FAMILY HISTORY:  Mother is living and healthy.  She is not sure of her  father's health.  She has an uncle who had colon cancer.   PHYSICAL EXAMINATION:  VITAL SIGNS:  Unremarkable.  GENERAL:  An obese African American female in no acute distress.  LUNGS:  Grossly normal.  HEART:  Regular rate and rhythm without murmurs or gallops.  ABDOMEN:  Soft and nontender with good bowel sounds.   ASSESSMENT:  1. Rectal bleeding.  May well have been due  to hemorrhoids, but since      this occurred suddenly and with the occurrence of chest pain at the      same time, I would wonder if she may have had a clot to the      mesenteric vessels as a possible explanation.  More than likely,      this was just hemorrhoids.  Unfortunately, with an acute pulmonary      emboli and DVT, she is going to need to be anticoagulated for some      time and a colonoscopy would not be feasible.  2. Dyspepsia, probably due to the Robaxin she has been taking, and      chronic reflux.   PLAN:  Would empirically treat with proton pump inhibitors and treat the  hemorrhoids with MiraLax to keep her stools soft.  We could always do a  flexible sigmoidoscopy followup, anticoagulated if needed, but otherwise  would like to delay colonoscopy for the next couple of months.           ______________________________  Llana Aliment. Malon Kindle., M.D.     Waldron Session  D:  08/01/2008  T:  08/02/2008  Job:  [id number]   cc:   Cassell Clement, M.D.

## 2010-05-19 NOTE — Op Note (Signed)
NAMETOMMA, EHINGER            ACCOUNT NO.:  [id number]   MEDICAL RECORD NO.:  [id number]          PATIENT TYPE:  OBV   LOCATION:  5011                         FACILITY:  [facility]   PHYSICIAN:  Almedia Balls. Ranell Patrick, M.D. DATE OF BIRTH:  03-24-1955   DATE OF PROCEDURE:  07/26/2008  DATE OF DISCHARGE:                               OPERATIVE REPORT   PREOPERATIVE DIAGNOSES:  Right knee lateral meniscus tear, medial  meniscus tear, osteoarthritis, and Baker cyst.   POSTOPERATIVE DIAGNOSES:  Right knee lateral meniscus tear,  osteoarthritis, and Baker cyst.   PROCEDURE PERFORMED:  Right knee arthroscopy with partial lateral  meniscectomy, chondroplasty to bleeding bone and 3 compartments  including medial femoral condyle, lateral femoral condyle, and  patellofemoral joint, and aspiration of Baker cyst.   ATTENDING SURGEON:  Almedia Balls. Ranell Patrick, MD   ASSISTANT:  None.   ANESTHESIA:  General anesthesia was used.   ESTIMATED BLOOD LOSS:  Minimal.   FLUID REPLACEMENT:  1000 mL of crystalloid.   INSTRUMENT COUNTS:  Correct.   COMPLICATIONS:  None.   Preoperative antibiotics were given.   INDICATIONS:  The patient is a 55 year old female with a history of  worsening right medial knee pain after twisting injury at work.  The  patient works in school system.  The patient has had worsening pain and  functional loss.  The patient was noted to have significant arthritis in  her knee on standing x-rays and on the MRI.  Given her persistence of  pain and the fact that she was not feeling nearly this much pain prior  to her injury and based on preoperative MRI demonstrating torn menisci  in her knee, we allowed to proceed with arthroscopic management of her  knee.  She is now a total knee candidate because of her morbid obesity  at greater than 330 pounds.  The patient fully understood potential  limited gains of the arthroscopy that we could deal with meniscal tears,  but not dealing in an  effective way with the arthritis.  Informed  consent was obtained.   DESCRIPTION OF PROCEDURE:  After an adequate level of anesthesia was  achieved, the patient was positioned supine on the operating table.  Right leg was correctly identified.  A lateral post was utilized.  After  sterile prep and drape of the right leg, we entered the knee using  standard portals including superolateral outflow, anterolateral scope,  and anteromedial working portals.  The patient had significant synovitis  present within the knee.  The patient had advanced patellofemoral  chondromalacia with grade 3 and grade 4 changes in the trochlea and  grade 3 chondromalacia in the patella.  We performed a chondroplasty  using a motorized shaver down to stable articular cartilage and to  bleeding bone in the trochlea.  There was a large portion of medial  femoral condyle that was devoid of cartilage in a skid-type lesion  deeper into the knee in flexion.  There was some loose flaps of  cartilage and near full-thickness defects which were smoothed over using  a chondroplasty technique with a motorized shaver again to bleeding  bone.  The medial meniscus tear was not appreciated, although I could  not see the medial meniscus from directly posterior to the medial  femoral condyle over to the meniscal root.  I probed both the femoral  and tibial surfaces of the anterior middle horn and about a half of the  posterior horn and did not see a tear.  We identified the ACL and PCL to  be intact.  The lateral meniscus was torn.  This was a complex posterior  horn tear and it was radial tear that involved the majority of the  posterior horn of the lateral meniscus.  We performed a subtotal lateral  meniscectomy to remove the torn tissue.  There was also grade 3  chondromalacia on the tibial condyle and grade 3/4 chondromalacia on the  femoral condyle.  We performed a chondroplasty on the lateral  compartment as well as the  partial meniscectomy.  Following completion  of the partial lateral meniscectomy, chondroplasty, and 3 compartments,  we concluded the arthroscopic portion of the surgery and I sutured the  wounds with 4-0 Monocryl followed by Steri-Strips.  I then appreciated a  large palpable Baker cyst posteriorly which I aspirated using an 18  gauge spinal needle with a single pass and was able to get about 70 mL  of gelatinous fluid off that Baker cyst through the spinal needle.  We  then applied a sterile compressive bandage and cryotherapy unit.  The  patient tolerated the surgery well.      Almedia Balls. Ranell Patrick, M.D.  Electronically Signed     SRN/MEDQ  D:  07/26/2008  T:  07/27/2008  Job:  [id number]

## 2010-05-19 NOTE — H&P (Signed)
NAMECANNON, Mia NO.:  [id number]   MEDICAL RECORD NO.:  [id number]          PATIENT TYPE:  INP   LOCATION:  2028                         FACILITY:  [facility]   PHYSICIAN:  Massie Maroon, MD        DATE OF BIRTH:  November 27, 1955   DATE OF ADMISSION:  07/31/2008  DATE OF DISCHARGE:                              HISTORY & PHYSICAL   PRIMARY CARE PHYSICIAN:  The patient has no primary care physician.   CHIEF COMPLAINT:  DVT, bright red blood per rectum.   HISTORY OF PRESENT ILLNESS:  A 55 year old female with a history of  recent right knee arthroscopy on July 26, 2008.  Was at home and had  constipation.  At 1 a.m. this morning she had a bowel movement and had  bright red blood per rectum.  She felt slightly lightheaded and went  back to bed and then at 11 a.m. in the morning she had another episode  of bright red blood per rectum.  The patient also complained of some  slight lower abdominal cramps.  She notes constipation, probably due to  pain medicine.  She notes some black stool.  She noted using some  ibuprofen last night.  The patient denies any fever, chills, nausea,  vomiting, diarrhea, heartburn.  She did note that her leg had been  swelling and that she had had some right leg pain since discharge.  She  has been immobile due to her right knee arthroscopy.  She notes some  chest pain.  This started this morning.  It was sharp and without  radiation.  It was substernal and associated with some shortness of  breath.  The patient denies any palpitations.  It lasted until she got  here.   The patient was evaluated in the ED and right lower extremity Doppler  showed evidence of DVT.  It was in the right posterior tibial vein.  Abdominal x-ray was negative for any specific abdominal findings.  Chest  x-ray was negative for any acute cardiopulmonary disease.   CTA chest is pending.  The patient will be admitted for bright red blood  per rectum and DVT and rule out  of PE.   PAST MEDICAL HISTORY:  1. Hypertension.  2. UTI on antibiotics since Friday.  3. History of bilateral knee pain.   PAST SURGICAL HISTORY:  1. Left knee arthroscopy, 2000.  2. Cholecystectomy.  3. Tubal ligation.  4. History of abnormal uterine bleeding with hysteroscopy October 06, 2006 which shows fibroids.  5. Right knee arthroscopy July 26, 2008 with partial lateral      meniscectomy, chondroplasty to bleeding bone, and three compartment      including medial femoral condyle, lateral femoral condyle,      patellofemoral joint, aspiration of Baker's cyst.   SOCIAL HISTORY:  The patient does not smoke or drink.  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   FAMILY HISTORY:  Mother is alive at age 85 and healthy other than a  pacer.  She is not sure of her father.  She has seven siblings.  She  states they are all healthy.   REVIEW OF SYSTEMS:  Negative for all 10 organ systems except for  pertinent positives stated above.   PHYSICAL EXAMINATION:  VITAL SIGNS:  Temperature 98.3, pulse 106,  respiratory rate 18, blood pressure 156/94, pulse ox 98% on room air.  HEENT: Anicteric, EOMI.  No nystagmus.  Pupils 1.5 mm, symmetric,  direct, consensual, near reflexes intact.  Mucous membranes moist.  NECK:  No JVD, no bruit, no thyromegaly, no adenopathy.  HEART:  Regular rate and rhythm.  S1/S2.  No murmurs, gallops or rubs.  LUNGS:  Clear to auscultation bilaterally.  ABDOMEN:  Soft, obese, nontender, nondistended.  Positive bowel sounds.  EXTREMITIES:  No cyanosis, clubbing, trace edema, slight tenderness of  the back of the right calf, slight right distal lower extremity  swelling, evidence of prior arthroscopy recently.  SKIN:  No rashes.  LYMPH NODES:  No adenopathy.  NEUROLOGIC:  Nonfocal.  PSYCHIATRIC:  Alert and oriented x3.   LABORATORY DATA:  PTT 27, PT 13.4, INR 1.  WBC 13.6, hemoglobin 14.8,  platelet count  201.  Sodium 140, potassium 3.6, chloride 104, bicarb 29, BUN 14, creatinine  0.8.  Hemoccult stool was negative for blood.   ASSESSMENT/PLAN:  1. Bright red blood per rectum.  The patient will be started on      Protonix 40 mg IV b.i.d..  The patient will be kept n.p.o..  GI      consult to evaluate bright red blood per rectum.  Will check a CBC      at 0100 hours and if hemoglobin is decreasing then we may need to      transfuse.  Encouraging is that her Hemoccult stool was negative.  2. Deep venous thrombosis.  The patient will be placed on Lovenox 1      mg/kg subcu b.i.d..  We will check a CT angio chest to rule out      pulmonary embolus due to her complaints of chest pain.  3. Chest pain.  The patient will be placed on telemetry.  Will check      troponin I q.8 h x3 sets.  Consider stress test in a.m.  No aspirin      for now due to bright red blood per rectum complaints.  4. DVT prophylaxis:  The patient is already on therapeutic Lovenox.      Massie Maroon, MD  Electronically Signed     JYK/MEDQ  D:  07/31/2008  T:  08/01/2008  Job:  [id number]   cc:   Almedia Balls. Ranell Patrick, M.D.  Cassell Clement, M.D.  Sharyn Dross., M.D.

## 2010-05-22 NOTE — Procedures (Signed)
[place]  Patient:    Mia [name], Mia [name]                   MRN: [id number] Proc. Date: 05/31/00 Adm. Date:  [id number] Attending:  Sharyn Dross                           Procedure Report  REFERRING PHYSICIAN:  Sharyn Dross., M.D.  PREOPERATIVE DIAGNOSIS:  His family history of colon cancer.  POSTOPERATIVE DIAGNOSES: 1. Diverticulosis in the descending colon. 2. Otherwise normal examination for the rest of the colon.  PROCEDURE:  Colonoscopy.  ENDOSCOPIST:  Dortha Kern, Montez Hageman., M.D.  MEDICATIONS:  Demerol 100 mg IV, Versed 10 mg IV over 10 minute period of time.  INSTRUMENT:  Olympus video pancolonoscope.  INDICATIONS:  This pleasant 55 year old female came to me because of previous history of bleeding that was noted, as well as family history of colon cancer that is present.  The patient was brought in for the colonoscopy as a screening test to evaluate if she has inherited any symptoms of colon pathology, such as polyps or cancerous lesions that is present.  She has no specific GI complaints as noted at this time, except constipation that is ongoing.  OBJECTIVE FINDINGS:  She is an obese female in no acute distress.  Her vital signs are stable.  Her HEENT examination is anicteric.  NECK was supple. LUNGS are clear.  HEART had a regular rate and rhythm without heaves, thrills, murmurs or gallops.  The ABDOMEN was soft, there was no tenderness, it was obese, no hepatosplenomegaly appreciated.  Digital rectal examination was normal.  EXTREMITIES showed no clubbing, cyanosis, or edema.  PLAN:  I am going to proceed with the colonoscopic examination.  INFORMED CONSENT:  Patient was advised of the procedure, indications, and the risks involved. A video was reviewed in the office and consent form obtained.  PREOPERATIVE PREPARATION:  The patient was brought in the endoscopy unit, when an IV for IV sedating medication was started.  A  monitor was placed on the patient to monitor the patients vital signs and oxygen saturation.  Nasal oxygen at 2 L/min was used and after adequate sedation was performed, the procedure was begun.  BOWEL PREP:  The patient was given GoLYTELY and Reglan as a bowel prep.  The patient tolerated the prep well without any complications.  The quality of the prep was fair, but adequate for the evaluation at this time.  PROCEDURE NOTE:  The instrument was advanced with patient lying in the left lateral position to approximately 140 cm proximal colon to the cecal region. This was confirmed by the ileocecal valve that was noted at this time.  There appeared to be no gross abnormalities, such as, masses, polyps or stricture lesions appreciated.  The vascular pattern appeared to be well within normal limits throughout the entire colon.  The mucosal pattern showed evidence of diverticulosis that was present with the divertics being small in character without any evidence of surrounding inflammation around the divertics that were appreciated.  This appeared to be only in the descending colon region that was noted.  The rest of the colon was unremarkable at this time and there was no evidence of any other granular changes as noted.  There was no evidence of any increased tortuosity of the colon that was noted at this time.  The instrument was removed per rectum without any evidence  of internal or external hemorrhoids noted.  The patient tolerated the procedure well.  TREATMENT: 1. Conservative management at this time was recommended. 2. Would recommend repeating the procedure in approximately three years for    reevaluation of the process. 3. Will have follow-up in the office in the next two weeks and depending upon    her results will determine the course of therapy. DD:  05/31/00 TD:  05/31/00 Job: [id number]

## 2010-05-25 ENCOUNTER — Ambulatory Visit (INDEPENDENT_AMBULATORY_CARE_PROVIDER_SITE_OTHER): Payer: Worker's Compensation | Admitting: Infectious Disease

## 2010-05-25 VITALS — BP 157/102 | HR 84 | Temp 98.6°F | Ht 63.0 in | Wt 331.3 lb

## 2010-05-25 DIAGNOSIS — A4902 Methicillin resistant Staphylococcus aureus infection, unspecified site: Secondary | ICD-10-CM

## 2010-05-25 DIAGNOSIS — M009 Pyogenic arthritis, unspecified: Secondary | ICD-10-CM

## 2010-05-25 DIAGNOSIS — T8489XA Other specified complication of internal orthopedic prosthetic devices, implants and grafts, initial encounter: Secondary | ICD-10-CM

## 2010-05-25 DIAGNOSIS — M109 Gout, unspecified: Secondary | ICD-10-CM

## 2010-05-25 DIAGNOSIS — M119 Crystal arthropathy, unspecified: Secondary | ICD-10-CM

## 2010-05-25 LAB — CBC WITH DIFFERENTIAL/PLATELET
Basophils Absolute: 0 10*3/uL (ref 0.0–0.1)
Basophils Relative: 0 % (ref 0–1)
MCHC: 31.6 g/dL (ref 30.0–36.0)
Neutro Abs: 5 10*3/uL (ref 1.7–7.7)
Neutrophils Relative %: 64 % (ref 43–77)
Platelets: 315 10*3/uL (ref 150–400)
RDW: 17.8 % — ABNORMAL HIGH (ref 11.5–15.5)

## 2010-05-25 LAB — BASIC METABOLIC PANEL
BUN: 10 mg/dL (ref 6–23)
Potassium: 4.5 mEq/L (ref 3.5–5.3)
Sodium: 140 mEq/L (ref 135–145)

## 2010-05-25 LAB — C-REACTIVE PROTEIN: CRP: 1 mg/dL — ABNORMAL HIGH (ref <0.6)

## 2010-05-25 LAB — SEDIMENTATION RATE: Sed Rate: 12 mm/hr (ref 0–22)

## 2010-05-25 MED ORDER — SULFAMETHOXAZOLE-TRIMETHOPRIM 800-160 MG PO TABS: 1.0000 | ORAL_TABLET | Freq: Two times a day (BID) | ORAL | Status: AC

## 2010-05-25 MED ORDER — COLCHICINE 0.6 MG PO TABS: 0.6000 mg | ORAL_TABLET | Freq: Every day | ORAL | Status: DC

## 2010-05-25 NOTE — Assessment & Plan Note (Signed)
I have written a prescription for colchicine at 0.6 mg per day to prevent gout as she can for the talk to Dr. Patty Sermons about this whether she should be continued.

## 2010-05-25 NOTE — Progress Notes (Signed)
Subjective:    Patient ID: Mia [name], female    DOB: 04-05-1955, 55 y.o.   MRN: [id number]  HPI  55 year old with recurrent prosthetic knee infection with MRSA sp repeat I and D in January with exchange of polyethylene and washout by Dr Ranell Patrick. She was being treated with daptomycin due to concern about a rxn to vancomycin. During her stay she developed allergic rash on zosyn and cefepime. She was on the daptomycin and developed a PICC line recenlty >N infection with e coli and klebsiella and was treated with cipro which she is still finshing. Patients is having more pain after PT. She was  having pain behind the knee and behind the knee. She was seen in the ED due to swelling in entire leg  Her pain was so severe that she could not tolerate the water in her shower touching her toe. Dr. Hyacinth Meeker gave her She was empirically for gout with colchicine and prednisone. Leg pain and swelling improved. She still has persistent knee pain however and has been seen by Dr. Ranell Patrick who ordered doppler of her leg and tagged wbc. Tagged white blood cell scan study was negative for infection. Dr. Kerney Elbe on he suggested colloid study if there were still high clinical suspicion. Since having been seen by me the last time she continues to have some pain largely with standing and bearing weight on this is resolved with rest. She has not had fever chills nausea or malaise. She seemed doing relatively well and she continues on her oral suppressive antibiotic.  Review of Systems  Constitutional: Positive for appetite change. Negative for fever, chills, diaphoresis, activity change and fatigue.  HENT: Negative for facial swelling and neck pain.   Eyes: Negative for visual disturbance.  Respiratory: Negative for apnea and chest tightness.   Cardiovascular: Negative for chest pain, palpitations and leg swelling.  Gastrointestinal: Negative for nausea, diarrhea, constipation, blood in stool, abdominal distention and anal  bleeding.  Genitourinary: Positive for flank pain. Negative for dysuria and difficulty urinating.  Musculoskeletal: Positive for back pain, joint swelling and arthralgias. Negative for myalgias and gait problem.  Skin: Negative for color change and pallor.  Neurological: Negative for weakness and light-headedness.  Hematological: Negative for adenopathy. Does not bruise/bleed easily.  Psychiatric/Behavioral: Negative for confusion, dysphoric mood, decreased concentration and agitation.       Objective:   Physical Exam  Constitutional: She is oriented to person, place, and time. She appears well-developed and well-nourished. No distress.  HENT:  Head: Normocephalic and atraumatic.  Mouth/Throat: No oropharyngeal exudate.  Eyes: Conjunctivae and EOM are normal. Pupils are equal, round, and reactive to light. No scleral icterus.  Neck: No JVD present.  Cardiovascular: Normal rate, regular rhythm and normal heart sounds.  Exam reveals no gallop and no friction rub.   No murmur heard. Pulmonary/Chest: Effort normal and breath sounds normal. No respiratory distress. She has no wheezes.  Abdominal: She exhibits no distension. There is no tenderness. There is no rebound.  Musculoskeletal:       Right knee: She exhibits swelling and effusion. She exhibits no erythema. tenderness found.  Lymphadenopathy:    She has no cervical adenopathy.  Neurological: She is alert and oriented to person, place, and time. Coordination normal.  Skin: Skin is warm and dry. No bruising, no ecchymosis, no laceration and no rash noted. She is not diaphoretic. No pallor.       Surgical site is clean dry and intact it is not warm  there is some tenderness about the knee  Psychiatric: She has a normal mood and affect. Her behavior is normal. Judgment and thought content normal.          Assessment & Plan:  PROSTHETIC JOINT COMPLICATION Continue Septra patient will followup in 2 months time with me. Her  sedimentation rate was normal when last checked which is quite encouraging.  White blood cell tagged scan scan was also encouraging  Gout  I have written a prescription for colchicine at 0.6 mg per day to prevent gout as she can for the talk to Dr. Patty Sermons about this whether she should be continued.  METHICILLIN RESISTANT STAPHYLOCOCCUS AUREUS INFECTION Culprit pathogen one should consider decolonization before any future elective surgeries.

## 2010-05-25 NOTE — Assessment & Plan Note (Signed)
Continue Septra patient will followup in 2 months time with me. Her sedimentation rate was normal when last checked which is quite encouraging.  White blood cell tagged scan scan was also encouraging

## 2010-05-25 NOTE — Assessment & Plan Note (Signed)
Culprit pathogen one should consider decolonization before any future elective surgeries.

## 2010-05-27 ENCOUNTER — Encounter: Payer: Self-pay | Admitting: Cardiology

## 2010-05-27 ENCOUNTER — Ambulatory Visit (INDEPENDENT_AMBULATORY_CARE_PROVIDER_SITE_OTHER): Payer: Worker's Compensation | Admitting: Cardiology

## 2010-05-27 ENCOUNTER — Telehealth: Payer: Self-pay | Admitting: *Deleted

## 2010-05-27 ENCOUNTER — Ambulatory Visit (INDEPENDENT_AMBULATORY_CARE_PROVIDER_SITE_OTHER): Payer: Worker's Compensation | Admitting: *Deleted

## 2010-05-27 ENCOUNTER — Telehealth: Payer: Self-pay | Admitting: Cardiology

## 2010-05-27 DIAGNOSIS — O2230 Deep phlebothrombosis in pregnancy, unspecified trimester: Secondary | ICD-10-CM

## 2010-05-27 DIAGNOSIS — I82409 Acute embolism and thrombosis of unspecified deep veins of unspecified lower extremity: Secondary | ICD-10-CM

## 2010-05-27 DIAGNOSIS — I10 Essential (primary) hypertension: Secondary | ICD-10-CM

## 2010-05-27 DIAGNOSIS — M109 Gout, unspecified: Secondary | ICD-10-CM

## 2010-05-27 MED ORDER — WARFARIN SODIUM 5 MG PO TABS: ORAL_TABLET | ORAL | Status: DC

## 2010-05-27 NOTE — Assessment & Plan Note (Signed)
The patient recently had some tingling in her toes and she does have a past history of gout.  She also had recent severe left flank pain radiating around to the groin on the left side.  The pain resolved on its own.  It may have been a kidney stone.  The patient is now taking colchicine 0.6 mg daily.  We will plan to check a uric acid when she returns her next office visit in 6 weeks

## 2010-05-27 NOTE — Progress Notes (Signed)
[name] Date of Birth:  1956/01/02 [facility] / [facility] [address]   [address] [place], [place]  [address] [phone]           Fax   [phone]  HPI: This 55 year old woman is seen for a followup office visit.  She has a past history of deep vein thrombosis.  She is on long-term Coumadin.  Recently she has been on long-term sulfa in the form of Bactrim DS because of a previously infected right total knee replacement.  She is being followed closely by Dr. Algis Liming of infectious disease.  The patient has a history of lateral pulmonary emboli in 2010 associated with DVT following right knee replacement.  She is also had prior DVT in the right upper extremity.  She also has a history of chronic obesity, hypertension, hyperlipidemia, IBS with constipation, and anemia.  She does not have any history of skin heart disease.  She had a normal two-day adenosine Cardiolite stress test on 10/16/1998 her last echocardiogram done in 08/02/08 showed an ejection fraction of 60-65% with mild concentric hypertrophy she had slightly elevated pulmonary pressures at 46 mmHg.  Current Outpatient Prescriptions  Medication Sig Dispense Refill  . ezetimibe (ZETIA) 10 MG tablet Take 10 mg by mouth daily.        . furosemide (LASIX) 40 MG tablet 1 daily as needed  30 tablet  11  . oxyCODONE-acetaminophen (PERCOCET) 5-325 MG per tablet Take 2 tablets by mouth every 4 (four) hours as needed.        . pantoprazole (PROTONIX) 40 MG tablet Take 40 mg by mouth daily.        . polyethylene glycol (MIRALAX) powder Take 17 g by mouth. Use as directed for constipation       . pravastatin (PRAVACHOL) 40 MG tablet Take 40 mg by mouth daily.        Marland Kitchen sulfamethoxazole-trimethoprim (BACTRIM DS) 800-160 MG per tablet Take 1 tablet by mouth 2 (two) times daily.  60 tablet  6  . telmisartan (MICARDIS) 80 MG tablet Take 80 mg by mouth daily.        Marland Kitchen warfarin (COUMADIN) 5 MG tablet Take as directed by  Anticoagulation clinic Pt takes up to 1 1/2 tablets daily  45 tablet  11  . DISCONTD: acetaminophen (TYLENOL ARTHRITIS PAIN) 650 MG CR tablet Take 650 mg by mouth every 6 (six) hours as needed.        Marland Kitchen DISCONTD: warfarin (COUMADIN) 5 MG tablet Take as directed by Anticoagulation clinic Pt takes up to 1 1/2 tablets daily  45 tablet  2  . methocarbamol (ROBAXIN) 500 MG tablet Take 500 mg by mouth every 6 (six) hours as needed. As needed for muscle spasm       . oxycodone (OXYCONTIN) 30 MG TB12 Take 30 mg by mouth every 12 (twelve) hours. Taking as needed      . DISCONTD: colchicine 0.6 MG tablet Take 1 tablet (0.6 mg total) by mouth daily.  30 tablet  11    Allergies  Allergen Reactions  . Amlodipine     Edema,ha  . Crestor (Rosuvastatin Calcium)     Chest pain  . Zosyn     Patient Active Problem List  Diagnoses  . METHICILLIN RESISTANT STAPHYLOCOCCUS AUREUS INFECTION  . KLEBSIELLA PNEUMONIAE INFECTION  . BACTEREMIA  . PROSTHETIC JOINT COMPLICATION  . Essential hypertension  . DVT (deep venous thrombosis)  . DVT (deep venous thrombosis)  . Hair  loss  . Gout  . Leg swelling  . HTN (hypertension)  . Chronic anticoagulation  . Other pulmonary embolism and infarction    History  Smoking status  . Never Smoker   Smokeless tobacco  . Never Used    History  Alcohol Use No    No family history on file.  Review of Systems: The patient denies any heat or cold intolerance.  No weight gain or weight loss.  The patient denies headaches or blurry vision.  There is no cough or sputum production.  The patient denies dizziness.  There is no hematuria or hematochezia.  The patient denies any muscle aches or arthritis.  The patient denies any rash.  The patient denies frequent falling or instability.  There is no history of depression or anxiety.  All other systems were reviewed and are negative.   Physical Exam: Filed Vitals:   05/27/10 1134  BP: 140/90  Pulse: 72  The general  appearance is that of a well-developed large woman in no acute distress.Pupils equal and reactive.   Extraocular Movements are full.  There is no scleral icterus.  The mouth and pharynx are normal.  The neck is supple.  The carotids reveal no bruits.  The jugular venous pressure is normal.  The thyroid is not enlarged.  There is no lymphadenopathy.The chest is clear to percussion and auscultation. There are no rales or rhonchi. Expansion of the chest is symmetrical.  There is no CVA punch tendernessThe precordium is quiet.  The first heart sound is normal.  The second heart sound is physiologically split.  There is no murmur gallop rub or click.  There is no abnormal lift or heave.The abdomen is soft and nontender. Bowel sounds are normal. The liver and spleen are not enlarged. There Are no abdominal masses. There are no bruits.  Extremities reveal no pitting edema or phlebitis.Strength is normal and symmetrical in all extremities.  There is no lateralizing weakness.  There are no sensory deficits.    Assessment / Plan: Continue same medication except increase warfarin to 7.5 mg x3 days and 5 mg x4 days.  Her INR today is 1.6.  Recheck in 2 weeks for a protime and in 6 weeks for office visit protime and lab work

## 2010-05-27 NOTE — Assessment & Plan Note (Signed)
The patient is on long-term Coumadin for recurrent DVT.  Her INR today is subtherapeutic at 1.6.  She is presently on long-term Septra for previous knee sepsis status post right total knee replacement with concern for potential infection.  We will increase her warfarin to 5 mg 4 days and 7.5 mg 3 days.  She has not been having any bleeding complications from the warfarin.

## 2010-05-27 NOTE — Telephone Encounter (Signed)
Samples gathered for patient 

## 2010-05-27 NOTE — Telephone Encounter (Signed)
PT HAS OV AT 1115AM THIS MORNING, WANTS TO GET SAMPLES OF ZETIA AND MICARDIS.

## 2010-05-27 NOTE — Assessment & Plan Note (Signed)
The patient has a history of essential hypertension.  She has not been expressing any chest pain.  She does have chronic exertional dyspnea.  She's had no palpitations dizziness or syncope.  She remains on Micardis which she is tolerating

## 2010-05-27 NOTE — Telephone Encounter (Signed)
rec'd fax from [facility] on [address] asking for prior auth on colcrys. I called the number given [phone] & was told to call the PA dept at [phone]. They only do workers comp, but did find the pt. She will send it to another person & instructed me to call [facility] tomorrow to see if it had been approved

## 2010-05-29 ENCOUNTER — Telehealth: Payer: Self-pay | Admitting: Cardiology

## 2010-05-29 NOTE — Telephone Encounter (Signed)
Faxed over office note with list of medications. Lm for patient to call.

## 2010-05-29 NOTE — Telephone Encounter (Signed)
Patient is seeing Dr.kuzma now and he wants to know what medications she is on so he can give patient a prescription for inflamation

## 2010-05-29 NOTE — Telephone Encounter (Signed)
Agree with plan 

## 2010-05-29 NOTE — Telephone Encounter (Signed)
Spoke with Dr. Patty Sermons regarding medications for inflammation and he recommends celebrex.  Will get INR and blood pressure check 1 week after starting. Advised patient

## 2010-06-02 ENCOUNTER — Encounter: Payer: Self-pay | Admitting: *Deleted

## 2010-06-09 ENCOUNTER — Other Ambulatory Visit: Payer: Self-pay | Admitting: *Deleted

## 2010-06-09 DIAGNOSIS — Z79899 Other long term (current) drug therapy: Secondary | ICD-10-CM

## 2010-06-10 ENCOUNTER — Other Ambulatory Visit (INDEPENDENT_AMBULATORY_CARE_PROVIDER_SITE_OTHER): Payer: Worker's Compensation | Admitting: *Deleted

## 2010-06-10 ENCOUNTER — Ambulatory Visit (INDEPENDENT_AMBULATORY_CARE_PROVIDER_SITE_OTHER): Payer: Worker's Compensation | Admitting: *Deleted

## 2010-06-10 ENCOUNTER — Telehealth: Payer: Self-pay | Admitting: *Deleted

## 2010-06-10 DIAGNOSIS — I82409 Acute embolism and thrombosis of unspecified deep veins of unspecified lower extremity: Secondary | ICD-10-CM

## 2010-06-10 DIAGNOSIS — Z79899 Other long term (current) drug therapy: Secondary | ICD-10-CM

## 2010-06-10 LAB — CBC WITH DIFFERENTIAL/PLATELET
Basophils Absolute: 0.3 10*3/uL — ABNORMAL HIGH (ref 0.0–0.1)
Hemoglobin: 12.8 g/dL (ref 12.0–15.0)
Lymphocytes Relative: 33.5 % (ref 12.0–46.0)
Monocytes Relative: 5.7 % (ref 3.0–12.0)
Neutro Abs: 3.4 10*3/uL (ref 1.4–7.7)
Neutrophils Relative %: 54.8 % (ref 43.0–77.0)
RDW: 18.5 % — ABNORMAL HIGH (ref 11.5–14.6)

## 2010-06-10 LAB — BASIC METABOLIC PANEL
BUN: 12 mg/dL (ref 6–23)
GFR: 98.01 mL/min (ref >60.00)
Potassium: 3.8 mEq/L (ref 3.5–5.1)
Sodium: 142 mEq/L (ref 135–145)

## 2010-06-10 LAB — POCT INR: INR: 1.6

## 2010-06-10 NOTE — Patient Instructions (Signed)
Has had dark stools for a week.  Checked cbc today, lab values ok.  Increased coumadin per Dr. Patty Sermons.  Advise if stools more consistently dark to let us know.  Will do the three stool cards ASAP and return as advised

## 2010-06-10 NOTE — Telephone Encounter (Signed)
Message copied by Burnell Blanks on Wed Jun 10, 2010  5:01 PM ------      Message from: Cassell Clement      Created: Wed Jun 10, 2010  4:58 PM       Please report to [place].  Her chemistries are fine and her CBC is fine.  No anemiaAnd her white count is normal

## 2010-06-10 NOTE — Telephone Encounter (Signed)
Advised patient

## 2010-06-24 ENCOUNTER — Ambulatory Visit (INDEPENDENT_AMBULATORY_CARE_PROVIDER_SITE_OTHER): Payer: Worker's Compensation | Admitting: *Deleted

## 2010-06-24 DIAGNOSIS — I82409 Acute embolism and thrombosis of unspecified deep veins of unspecified lower extremity: Secondary | ICD-10-CM

## 2010-06-24 LAB — POCT INR: INR: 1.5

## 2010-06-30 ENCOUNTER — Ambulatory Visit (INDEPENDENT_AMBULATORY_CARE_PROVIDER_SITE_OTHER): Payer: Worker's Compensation | Admitting: Cardiology

## 2010-06-30 ENCOUNTER — Encounter: Payer: Self-pay | Admitting: Cardiology

## 2010-06-30 ENCOUNTER — Other Ambulatory Visit (INDEPENDENT_AMBULATORY_CARE_PROVIDER_SITE_OTHER): Payer: Worker's Compensation | Admitting: *Deleted

## 2010-06-30 ENCOUNTER — Other Ambulatory Visit: Payer: Self-pay | Admitting: Cardiology

## 2010-06-30 ENCOUNTER — Ambulatory Visit (INDEPENDENT_AMBULATORY_CARE_PROVIDER_SITE_OTHER): Payer: Worker's Compensation | Admitting: *Deleted

## 2010-06-30 DIAGNOSIS — I82409 Acute embolism and thrombosis of unspecified deep veins of unspecified lower extremity: Secondary | ICD-10-CM

## 2010-06-30 DIAGNOSIS — E7800 Pure hypercholesterolemia, unspecified: Secondary | ICD-10-CM

## 2010-06-30 DIAGNOSIS — E119 Type 2 diabetes mellitus without complications: Secondary | ICD-10-CM

## 2010-06-30 DIAGNOSIS — Z79899 Other long term (current) drug therapy: Secondary | ICD-10-CM

## 2010-06-30 DIAGNOSIS — I10 Essential (primary) hypertension: Secondary | ICD-10-CM

## 2010-06-30 LAB — POCT INR: INR: 1.6

## 2010-06-30 LAB — HEPATIC FUNCTION PANEL
AST: 16 U/L (ref 0–37)
Alkaline Phosphatase: 78 U/L (ref 39–117)
Bilirubin, Direct: 0.1 mg/dL (ref 0.0–0.3)
Total Bilirubin: 0.6 mg/dL (ref 0.3–1.2)

## 2010-06-30 LAB — LIPID PANEL
HDL: 45.7 mg/dL (ref >39.00)
Total CHOL/HDL Ratio: 5
VLDL: 24.6 mg/dL (ref 0.0–40.0)

## 2010-06-30 LAB — BASIC METABOLIC PANEL
GFR: 104.05 mL/min (ref >60.00)
Glucose, Bld: 93 mg/dL (ref 70–99)
Potassium: 3.9 mEq/L (ref 3.5–5.1)
Sodium: 142 mEq/L (ref 135–145)

## 2010-06-30 NOTE — Assessment & Plan Note (Signed)
The patient has a history of previous deep vein thrombosis.  She is on long-term Coumadin.  Her INR today is subtherapeutic.  Because of this she had previously been instructed to avoid excessive greens because of the subtherapeutic prothrombin time.  However the patient also has severe constipation.  She depends on eating greens to help her bowel motility.  When she stopped eating greens her constipation worsened and presently she has not had a bowel movement in 5 days.  I advised her to start eating greens again and we will adjust her Coumadin upward as necessary to keep her therapeutic but we want her to eat greens to prevent serious problems with her constipation.

## 2010-06-30 NOTE — Progress Notes (Signed)
Mia [name] Date of Birth:  May 09, 1955 [facility] / [facility] [address]   [address] [place], [place]  [address] [phone]           Fax   [phone]  History of Present Illness: This pleasant 55 year old Caucasian female is seen for a scheduled followup office visit and prothrombin time.  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  She is also had a past history of an infection the prosthesis has had prior incision and drainage followed by peds surgery.  She's also had a history of prior DVT in the right upper extremity.  She's had a history of essential hypertension and is said long-standing exogenous obesity, hyperlipidemia, irritable bowel syndrome, severe constipation, and anemia.  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  She does not have a history of ischemic heart disease.  She had a normal nuclear stress test in 2000  Current Outpatient Prescriptions  Medication Sig Dispense Refill  . ezetimibe (ZETIA) 10 MG tablet Take 10 mg by mouth daily.        . furosemide (LASIX) 40 MG tablet 1 daily as needed  30 tablet  11  . methocarbamol (ROBAXIN) 500 MG tablet Take 500 mg by mouth every 6 (six) hours as needed. As needed for muscle spasm       . oxycodone (OXYCONTIN) 30 MG TB12 Take 30 mg by mouth every 12 (twelve) hours. Taking as needed      . pantoprazole (PROTONIX) 40 MG tablet Take 40 mg by mouth daily.        . polyethylene glycol (MIRALAX) powder Take 17 g by mouth. Use as directed for constipation       . pravastatin (PRAVACHOL) 40 MG tablet Take 40 mg by mouth daily.        Marland Kitchen telmisartan (MICARDIS) 80 MG tablet Take 80 mg by mouth daily.        Marland Kitchen warfarin (COUMADIN) 5 MG tablet Take as directed by Anticoagulation clinic Pt takes up to 1 1/2 tablets daily   45 tablet  11  . sulfamethoxazole-trimethoprim (BACTRIM DS) 800-160 MG per tablet Take 1 tablet by mouth 2 (two) times daily.  60 tablet  6  . DISCONTD: oxyCODONE-acetaminophen (PERCOCET) 5-325 MG per tablet Take 2 tablets by mouth every 4 (four) hours as needed.          Allergies  Allergen Reactions  . Amlodipine     Edema,ha  . Crestor (Rosuvastatin Calcium)     Chest pain  . Zosyn     Patient Active Problem List  Diagnoses  . METHICILLIN RESISTANT STAPHYLOCOCCUS AUREUS INFECTION  . KLEBSIELLA PNEUMONIAE INFECTION  . BACTEREMIA  . PROSTHETIC JOINT COMPLICATION  . Essential hypertension  . DVT (deep venous thrombosis)  . DVT (deep venous thrombosis)  . Hair loss  . Gout  . Leg swelling  . HTN (hypertension)  . Chronic anticoagulation  . Other pulmonary embolism and infarction    History  Smoking status  . Never Smoker   Smokeless tobacco  . Never Used    History  Alcohol Use No    No family history on file.  Review of Systems: Constitutional: no fever chills diaphoresis or fatigue or change in weight.  Head and neck: no hearing loss, no epistaxis, no photophobia or visual  disturbance. Respiratory: No cough, shortness of breath or wheezing. Cardiovascular: No chest pain peripheral edema, palpitations. Gastrointestinal: No abdominal distention, no abdominal pain, no change in bowel habits hematochezia or melena.Recent worsening constipation is Present Genitourinary: No dysuria, no frequency, no urgency, no nocturia. Musculoskeletal:No  back pain or myalgias.She walks with a limp secondary to her right knee predicament. Neurological: No dizziness, no headaches, no numbness, no seizures, no syncope, no weakness, no tremors. Hematologic: No lymphadenopathy, no easy bruising. Psychiatric: No confusion, no hallucinations, no sleep disturbance.    Physical Exam: Filed Vitals:   06/30/10 0955  BP: 140/100  Pulse: 68  The general appearance reveals a obese  African American woman in no acute distress.Pupils equal and reactive.   Extraocular Movements are full.  There is no scleral icterus.  The mouth and pharynx are normal.  The neck is supple.  The carotids reveal no bruits.  The jugular venous pressure is normal.  The thyroid is not enlarged.  There is no lymphadenopathy.The chest is clear to percussion and auscultation. There are no rales or rhonchi. Expansion of the chest is symmetrical.The precordium is quiet.  The first heart sound is normal.  The second heart sound is physiologically split.  There is no murmur gallop rub or click.  There is no abnormal lift or heave.The abdomen is soft and nontender. Bowel sounds are normal. The liver and spleen are not enlarged. There Are no abdominal masses. There are no bruits.  Extremities reveal no evidence of any active infection and the.  She does not have any pitting edema.  No deep vein thrombosis or phlebitis is noted at this time.Strength is normal and symmetrical in all extremities.  There is no lateralizing weakness.  There are no sensory deficits.   Assessment / Plan:  Continue present medication.  Work harder on weight loss.  Continue low-sodium low-calorie diet.  She needs to increase her greens and then we will increase her Coumadin to match a diet which is high in greens which allows her to move her bowels better.  She rechecked in 3 months for followup office visit lipid panel and chemistries

## 2010-06-30 NOTE — Assessment & Plan Note (Signed)
The patient has a history of essential hypertension.  Recently she's been under a lot of stress.  She is about to embark on another extensive reevaluation of her capacity to return to work in regard to her prior problems with her knee etc.  She has felt stressed and her blood pressures been running high.

## 2010-07-01 LAB — LDL CHOLESTEROL, DIRECT: Direct LDL: 135.4 mg/dL

## 2010-07-03 ENCOUNTER — Telehealth: Payer: Self-pay | Admitting: *Deleted

## 2010-07-03 NOTE — Progress Notes (Signed)
Advised 

## 2010-07-03 NOTE — Telephone Encounter (Signed)
Advised of labs 

## 2010-07-03 NOTE — Telephone Encounter (Signed)
Advised of lab 

## 2010-07-03 NOTE — Telephone Encounter (Signed)
Message copied by Burnell Blanks on Fri Jul 03, 2010  4:29 PM ------      Message from: Cassell Clement      Created: Thu Jul 02, 2010 10:40 AM       The LCL is 135 which is elevated.  Continue strict diet.  Continue same meds.

## 2010-07-03 NOTE — Telephone Encounter (Signed)
Message copied by Burnell Blanks on Fri Jul 03, 2010  4:28 PM ------      Message from: Cassell Clement      Created: Thu Jul 02, 2010 10:40 AM       The liver and kidney tests are normal.The blood sugar is normal.Potassium is normal.The cholesterol is mildly elevated.  Continue strict diet

## 2010-07-03 NOTE — Progress Notes (Signed)
advised

## 2010-07-15 ENCOUNTER — Ambulatory Visit: Payer: Self-pay | Admitting: Cardiology

## 2010-07-15 ENCOUNTER — Ambulatory Visit (INDEPENDENT_AMBULATORY_CARE_PROVIDER_SITE_OTHER): Payer: Worker's Compensation | Admitting: *Deleted

## 2010-07-15 DIAGNOSIS — I82409 Acute embolism and thrombosis of unspecified deep veins of unspecified lower extremity: Secondary | ICD-10-CM

## 2010-07-15 LAB — POCT INR: INR: 3

## 2010-07-20 NOTE — Discharge Summary (Signed)
  Mia [name], SCULLEY            ACCOUNT NO.:  [id number]  MEDICAL RECORD NO.:  [id number]           PATIENT TYPE:  LOCATION:                                 FACILITY:  PHYSICIAN:  Leonides Grills, M.D.     DATE OF BIRTH:  1955-06-25  DATE OF ADMISSION: DATE OF DISCHARGE:                        DISCHARGE SUMMARY - REFERRING   ADMITTING DIAGNOSES: 1. 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 2. Hyperlipidemia. 3. Hypertension. 4. Irritable bowel syndrome. 5. History of deep vein thrombosis. 6. Morbid obesity.  DISCHARGE DIAGNOSES: 1. Right total knee infection. 2. Hyperlipidemia. 3. Hypertension. 4. Irritable bowel. 5. History of deep vein thrombosis. 6. Morbid obesity. 7. Peripherally inserted central catheter placement for intravenous     antibiotics.  HISTORY:  This is a 55 year old female who is status post right total knee on November 14, 2009, who presented on January 02, 2010, with right knee pain, redness, and reported pus drainage.  The patient underwent an I and D of her knee by Dr. Ranell Patrick on December 26, 2009. Currently, she is on no antibiotics.  She has had no fevers, chills. She was brought to [facility] to have a PICC line placed and start IV antibiotics.  HOSPITAL COURSE:  The patient's hospital course was uncomplicated.  The patient remained afebrile.  Vital signs were stable.  The patient did undergo PICC line placement while in the hospital and outpatient IV antibiotics were set up.  LABORATORY DATA:  Routine labs on admission:  CBC, white count 8400, hemoglobin 12.1, hematocrit 37.4, platelets 273000.  BMET on admission, sodium 141, potassium 3.5, chloride 106, bicarb 26, glucose 108, BUN 12, creatinine 0.79.  ESR high at 26.  DISCHARGE INSTRUCTIONS:  MEDICATIONS: 1. Soma 350 mg q.8 h p.r.n. 2. Zetia 10  mg p.o. q.h.s. 3. Lasix 40 mg daily. 4. Hydrochlorothiazide 25 mg daily. 5. Vicodin 5/325 one to two p.o. q.4-6 h p.r.n. pain. 6. Protonix 40 mg 1 p.o. q.h.s. 7. Potassium chloride 20 mEq p.o. b.i.d. 8. Vancomycin per vancomycin protocol x2 weeks. 9. Micardis 80 mg 1 p.o. daily.  ACTIVITY:  The patient is weightbearing as tolerated.  WOUND CARE:  Moist to dry daily.  FOLLOWUP:  With Dr. Ranell Patrick on January 06, 2010, call [phone] for appointment.  SPECIAL INSTRUCTIONS:  Aspirin 325 mg p.o. b.i.d.  CONDITION ON DISCHARGE:  The patient was discharged to home in good stable condition.  Home health for IV vancomycin.     [name], P.A.   ______________________________ Leonides Grills, M.D.    GC/MEDQ  D:  05/29/2010  T:  05/29/2010  Job:  [id number]  cc:   Almedia Balls. Ranell Patrick, M.D. Fax: [phone]  Electronically Signed by [name] P.A. on 06/15/2010 01:44:41 PM Electronically Signed by Leonides Grills M.D. on 07/20/2010 05:52:50 PM

## 2010-07-24 ENCOUNTER — Ambulatory Visit: Payer: Self-pay | Admitting: Infectious Disease

## 2010-07-27 ENCOUNTER — Encounter: Payer: Self-pay | Admitting: Nurse Practitioner

## 2010-07-27 ENCOUNTER — Ambulatory Visit (INDEPENDENT_AMBULATORY_CARE_PROVIDER_SITE_OTHER): Payer: Worker's Compensation | Admitting: Nurse Practitioner

## 2010-07-27 ENCOUNTER — Telehealth: Payer: Self-pay | Admitting: Cardiology

## 2010-07-27 VITALS — BP 148/102 | HR 91 | Ht 63.0 in | Wt 338.0 lb

## 2010-07-27 DIAGNOSIS — I119 Hypertensive heart disease without heart failure: Secondary | ICD-10-CM

## 2010-07-27 DIAGNOSIS — I10 Essential (primary) hypertension: Secondary | ICD-10-CM

## 2010-07-27 MED ORDER — GUANFACINE HCL 1 MG PO TABS: 1.0000 mg | ORAL_TABLET | Freq: Every day | ORAL | Status: DC

## 2010-07-27 NOTE — Progress Notes (Signed)
Mia [name] Date of Birth: October 16, 1955   History of Present Illness: Mia [name] is seen today for a work in visit. She is seen for Mia [name]. Blood pressure remains high. She remains under a considerable amount of stress. She will find out tomorrow if she is to return to work. She has been on medical disability for knee infection. She remains on coumadin for her DVT and pulmonary emboli. She continues to have chronic pain issues. Her right arm always hurts. She has a ganglion cyst that can't be removed. Her head has been hurting as well. She has not tolerated Norvasc or metoprolol in the past for blood pressure.   Current Outpatient Prescriptions on File Prior to Visit  Medication Sig Dispense Refill  . colchicine 0.6 MG tablet Take 0.6 mg by mouth daily.        Marland Kitchen ezetimibe (ZETIA) 10 MG tablet Take 10 mg by mouth daily.        Marland Kitchen HYDROcodone-acetaminophen (NORCO) 5-325 MG per tablet Take 1 tablet by mouth every 6 (six) hours as needed.        . methocarbamol (ROBAXIN) 500 MG tablet Take 500 mg by mouth every 6 (six) hours as needed. As needed for muscle spasm       . pantoprazole (PROTONIX) 40 MG tablet Take 40 mg by mouth daily.        . polyethylene glycol (MIRALAX) powder Take 17 g by mouth 2 (two) times a week. Use as directed for constipation      . pravastatin (PRAVACHOL) 40 MG tablet Take 40 mg by mouth daily.        Marland Kitchen telmisartan (MICARDIS) 80 MG tablet Take 80 mg by mouth daily.        Marland Kitchen warfarin (COUMADIN) 5 MG tablet Take as directed by Anticoagulation clinic Pt takes up to 1 1/2 tablets daily  45 tablet  11  . DISCONTD: furosemide (LASIX) 40 MG tablet 1 daily as needed  30 tablet  11  . sulfamethoxazole-trimethoprim (BACTRIM DS) 800-160 MG per tablet Take 1 tablet by mouth 2 (two) times daily.  60 tablet  6    Allergies  Allergen Reactions  . Amlodipine     Edema,ha  . Crestor (Rosuvastatin Calcium)     Chest pain  . Zosyn     Past Medical History  Diagnosis  Date  . Hypertension   . Morbid obesity   . Nonsmoker   . Pulmonary embolism 2010    following right knee replacement  . DVT (deep venous thrombosis)     right upper extremitiy  . Chronic anticoagulation   . Infected prosthetic knee joint   . H/O: GI bleed   . Anemia   . IBS (irritable bowel syndrome)   . Hyperlipidemia   . WPW (Wolff-Parkinson-White syndrome)     Past Surgical History  Procedure Date  . Right knee replacement 2010    followed by I & D for infection  . Cholecystectomy     History  Smoking status  . Never Smoker   Smokeless tobacco  . Never Used    History  Alcohol Use No    No family history on file.  Review of Systems: The review of systems is as above.  All other systems were reviewed and are negative.  Physical Exam: BP 148/102  Pulse 91  Ht 5\' 3"  (1.6 m)  Wt 338 lb (153.316 kg)  BMI 59.87 kg/m2  LMP 04/10/2010 Patient is morbidly obese and  in no acute distress. Skin is warm and dry. Color is normal.  HEENT is unremarkable. Normocephalic/atraumatic. PERRL. Sclera are nonicteric. Neck is supple. No masses. No JVD. Lungs are clear with decreased breath sounds. Cardiac exam shows a regular rate and rhythm. Heart tones are distant. Abdomen is obese and soft. Extremities are full with significant edema. Gait and ROM are intact. No gross neurologic deficits noted.  LABORATORY DATA: EKG shows sinus with no acute changes.    Assessment / Plan:

## 2010-07-27 NOTE — Telephone Encounter (Signed)
Patient c/o elevated blood pressure, headache, and states chest just does not feel right.  Coming to see Lawson Fiscal today

## 2010-07-27 NOTE — Telephone Encounter (Signed)
Patient not feeling well and wants to speak w/RN regarding her meds

## 2010-07-27 NOTE — Assessment & Plan Note (Signed)
Blood pressure is not controlled. I suspect it has not been controlled for quite some time. She does not tolerate Norvasc due to headache. She noted hair loss with metoprolol. We will try some low dose Tenex at 1mg  at bedtime. I will see her in 2 weeks. Patient is agreeable to this plan and will call if any problems develop in the interim.

## 2010-07-27 NOTE — Patient Instructions (Addendum)
I am going to start you on a medicine for your blood pressure called Tenex. Take it each night. 1mg  tablet. I want to see you in about 2 weeks.   IMPORTANT: HOW TO USE THIS INFORMATION:  This is a summary and does NOT have all possible information about this product. This information does not assure that this product is safe, effective, or appropriate for you. This information is not individual medical advice and does not substitute for the advice of your health care professional. Always ask your health care professional for complete information about this product and your specific health needs.    GUANFACINE - ORAL (GWAN-[id number])    COMMON BRAND NAME(S): Tenex    USES:  This medication is used alone or with other medications to treat high blood pressure (hypertension). Lowering high blood pressure helps prevent strokes, heart attacks, and kidney problems. Guanfacine acts in the brain. It decreases certain nerve signals from the brain to the blood vessels and the heart. This causes the blood vessels to relax so that blood can flow more easily and also slows the heart rate. These effects help to lower blood pressure.    OTHER USES:  This section contains uses of this drug that are not listed in the approved professional labeling for the drug but that may be prescribed by your health care professional. Use this drug for a condition that is listed in this section only if it has been so prescribed by your health care professional. This drug may also be used for attention-deficit/hyperactivity disorder (ADHD).    HOW TO USE:  Take this medication by mouth with or without food, usually once daily at bedtime or as directed by your doctor. The dosage is based on your medical condition and response to treatment. Use this medication regularly to get the most benefit from it. To help you remember, take it at the same time each day. It may take several weeks before you get the full benefit of this medication. It  is important to continue taking this medication even if you feel well. Most people with high blood pressure do not feel sick. Do not suddenly stop taking this medication without consulting your doctor. Your condition may become worse when the drug is suddenly stopped. Your dose may need to be gradually decreased. Tell your doctor if your condition does not improve or if it worsens (for example, your routine blood pressure readings remain high or increase).    SIDE EFFECTS:  Dry mouth, drowsiness, dizziness, constipation, tiredness, and weakness may occur. Decreased sexual ability has been reported infrequently. If any of these effects persist or worsen, tell your doctor or pharmacist promptly. To relieve dry mouth, suck on (sugarless) hard candy or ice chips, chew (sugarless) gum, drink water, or use a saliva substitute. To lower your risk of dizziness and lightheadedness, get up slowly when rising from a sitting or lying position. Remember that your doctor has prescribed this medication because he or she has judged that the benefit to you is greater than the risk of side effects. Many people using this medication do not have serious side effects. Tell your doctor immediately if any of these unlikely but serious side effects occur: severe dizziness, fast/slow heartbeat, fainting, mental/mood changes (such as depression, confusion). A very serious allergic reaction to this drug is rare. However, seek immediate medical attention if you notice any symptoms of a serious allergic reaction, including: rash, itching/swelling (especially of the face/tongue/throat), severe dizziness, trouble breathing. This is not  a complete list of possible side effects. If you notice other effects not listed above, contact your doctor or pharmacist. In the [place] - Call your doctor for medical advice about side effects. You may report side effects to FDA at 1-800-FDA-1088. In [place] - Call your doctor for medical advice about side effects.  You may report side effects to [facility] [place] at [phone].    PRECAUTIONS:  Before taking guanfacine, tell your doctor or pharmacist if you are allergic to it; or if you have any other allergies. This product may contain inactive ingredients, which can cause allergic reactions or other problems. Talk to your pharmacist for more details. Before using this medication, tell your doctor or pharmacist your medical history, especially of: kidney disease, liver disease. This drug may make you dizzy or drowsy. Do not drive, use machinery, or do any activity that requires alertness until you are sure you can perform such activities safely. Limit alcoholic beverages. Before having surgery, tell your doctor or dentist that you are taking this medication. During pregnancy, this medication should be used only when clearly needed. Discuss the risks and benefits with your doctor. It is unknown if this medication passes into breast milk. Discuss the risks and benefits with your doctor before breast-feeding.    DRUG INTERACTIONS:  Your doctor or pharmacist may already be aware of any possible drug interactions and may be monitoring you for them. Do not start, stop, or change the dosage of any medicine before checking with your doctor or pharmacist first. Before using this medication, tell your doctor or pharmacist of all prescription and nonprescription/herbal products you may use, especially of: valproic acid, drugs affecting liver enzymes that remove guanfacine from your body (such as azole antifungals like ketoconazole, rifamycins like rifampin). Tell your doctor or pharmacist if you are taking other products that cause drowsiness, including alcohol, antihistamines (such as cetirizine, diphenhydramine), drugs for sleep or anxiety (such as alprazolam, diazepam, zolpidem), muscle relaxants, and narcotic pain relievers (such as codeine). Check the labels on all your medicines (such as cough-and-cold products, diet aids,  nonsteroidal anti-inflammatory drugs-NSAIDs such as ibuprofen for pain/fever reduction) because they may contain ingredients that cause drowsiness or could increase your blood pressure or heart rate. Ask your pharmacist about using those products safely. If your doctor has directed you to take low-dose aspirin for heart attack or stroke prevention (usually at dosages of 81-325 milligrams a day), you should continue taking it unless your doctor instructs you otherwise. Ask your doctor or pharmacist for more details. This document does not contain all possible interactions. Therefore, before using this product, tell your doctor or pharmacist of all the products you use. Keep a list of all your medications with you, and share the list with your doctor and pharmacist.    OVERDOSE:  If overdose is suspected, contact your local poison control center or emergency room immediately. [place] residents can call the [place] National Poison Hotline at (579)152-0009. [place] residents can call a Technical sales engineer center. Symptoms of overdose may include: severe drowsiness, severe dizziness, severe tiredness, very slow heartbeat.    NOTES:  Do not share this medication with others. Talk with your doctor about making changes to your lifestyle that may help this medication work better (such as stress reduction programs, exercise, and dietary changes). Have your blood pressure and heart rate checked regularly while taking this medication. Learn how to check your own blood pressure and heart rate at home, and share the results with your doctor.  MISSED DOSE:  If you miss a dose, take it as soon as you remember. If it is near the time of the next dose, skip the missed dose and resume your usual dosing schedule. Do not double the dose to catch up.    STORAGE:  Store at room temperature between 68-77 degrees F (20-25 degrees C) away from light and moisture. Do not store in the bathroom. Keep all medicines away from children and  pets. Do not flush medications down the toilet or pour them into a drain unless instructed to do so. Properly discard this product when it is expired or no longer needed. Consult your pharmacist or local waste disposal company for more details about how to safely discard your product.    Information last revised May 2010 Copyright(c) 2010 [employer].

## 2010-07-28 ENCOUNTER — Encounter: Payer: Self-pay | Admitting: Nurse Practitioner

## 2010-07-29 ENCOUNTER — Ambulatory Visit: Payer: Self-pay | Admitting: Infectious Disease

## 2010-08-03 ENCOUNTER — Ambulatory Visit (INDEPENDENT_AMBULATORY_CARE_PROVIDER_SITE_OTHER): Payer: Worker's Compensation | Admitting: Infectious Disease

## 2010-08-03 ENCOUNTER — Encounter: Payer: Self-pay | Admitting: Infectious Disease

## 2010-08-03 VITALS — BP 154/103 | HR 80 | Temp 98.3°F | Wt 343.0 lb

## 2010-08-03 DIAGNOSIS — T8489XA Other specified complication of internal orthopedic prosthetic devices, implants and grafts, initial encounter: Secondary | ICD-10-CM

## 2010-08-03 DIAGNOSIS — R7881 Bacteremia: Secondary | ICD-10-CM

## 2010-08-03 DIAGNOSIS — A4902 Methicillin resistant Staphylococcus aureus infection, unspecified site: Secondary | ICD-10-CM

## 2010-08-03 LAB — CBC WITH DIFFERENTIAL/PLATELET
Eosinophils Absolute: 0 10*3/uL (ref 0.0–0.7)
Lymphocytes Relative: 28 % (ref 12–46)
MCHC: 31.5 g/dL (ref 30.0–36.0)
Monocytes Absolute: 0.5 10*3/uL (ref 0.1–1.0)
Monocytes Relative: 7 % (ref 3–12)
Neutro Abs: 5.1 10*3/uL (ref 1.7–7.7)
Platelets: 295 10*3/uL (ref 150–400)
RDW: 16.6 % — ABNORMAL HIGH (ref 11.5–15.5)
WBC: 7.9 10*3/uL (ref 4.0–10.5)

## 2010-08-03 LAB — C-REACTIVE PROTEIN: CRP: 1.2 mg/dL — ABNORMAL HIGH (ref <0.6)

## 2010-08-03 NOTE — Progress Notes (Signed)
Subjective:    Patient ID: Mia [name], female    DOB: 1955/10/21, 55 y.o.   MRN: [id number]  HPI 55 year old with recurrent prosthetic knee infection with MRSA sp repeat I and D in January with exchange of polyethylene and washout by Dr Ranell Patrick. She was being treated with daptomycin due to concern about a rxn to vancomycin. During her stay she developed allergic rash on zosyn and cefepime. She was on the daptomycin and developed a PICC line recenlty >N infection with e coli and klebsiella and was treated with cipro which she is still finshing. Patients is having more pain after PT. She was having pain behind the knee and behind the knee. She was seen in the ED due to swelling in entire leg Her pain was so severe that she could not tolerate the water in her shower touching her toe. Dr. Hyacinth Meeker gave s empirical rx for gout with colchicine and prednisone. Leg pain and swelling improved. She still has persistent knee pain however and has been seen by Dr. Ranell Patrick who ordered doppler of her leg and tagged wbc. Tagged white blood cell scan study was negative for infection. Dr. Kerney Elbe on he suggested colloid study if there were still high clinical suspicion. When I checked inflammatory markers in May her ESR was 1,  And  CRP was normal as well. I had her continue the bactrim which she did through 10 days ago and stopped. She is still having pain  largely with standing and bearing weight on this is resolved with rest. She has not had fever chills nausea or malaise. She has had no worsening of sympotoms in the short time period off of antibiotics   Review of Systems  Constitutional: Negative for fever, chills, diaphoresis, activity change, appetite change, fatigue and unexpected weight change.  HENT: Negative for congestion, sore throat, rhinorrhea, sneezing, trouble swallowing and sinus pressure.   Eyes: Negative for photophobia and visual disturbance.  Respiratory: Negative for cough, chest tightness,  shortness of breath, wheezing and stridor.   Cardiovascular: Negative for chest pain, palpitations and leg swelling.  Gastrointestinal: Negative for nausea, vomiting, abdominal pain, diarrhea, constipation, blood in stool, abdominal distention and anal bleeding.  Genitourinary: Negative for dysuria, hematuria, flank pain and difficulty urinating.  Musculoskeletal: Positive for joint swelling and gait problem. Negative for myalgias, back pain and arthralgias.  Skin: Negative for color change, pallor, rash and wound.  Neurological: Positive for headaches. Negative for dizziness, tremors, weakness and light-headedness.  Hematological: Negative for adenopathy. Does not bruise/bleed easily.  Psychiatric/Behavioral: Negative for behavioral problems, confusion, sleep disturbance, dysphoric mood, decreased concentration and agitation.       Objective:   Physical Exam  Constitutional: She is oriented to person, place, and time. She appears well-developed and well-nourished. No distress.  HENT:  Head: Normocephalic and atraumatic.  Mouth/Throat: Oropharynx is clear and moist. No oropharyngeal exudate.  Eyes: Conjunctivae and EOM are normal. Pupils are equal, round, and reactive to light. No scleral icterus.  Neck: Normal range of motion. Neck supple. No JVD present.  Cardiovascular: Normal rate, regular rhythm and normal heart sounds.  Exam reveals no gallop and no friction rub.   No murmur heard. Pulmonary/Chest: Effort normal and breath sounds normal. No respiratory distress. She has no wheezes. She has no rales. She exhibits no tenderness.  Abdominal: She exhibits no distension and no mass. There is no tenderness. There is no rebound and no guarding.  Musculoskeletal: She exhibits no edema and no tenderness.  Right knee: She exhibits swelling, effusion and deformity. She exhibits no erythema and no bony tenderness. no tenderness found.       Left knee: She exhibits no effusion, no deformity  and no erythema. no tenderness found.  Lymphadenopathy:    She has no cervical adenopathy.  Neurological: She is alert and oriented to person, place, and time. She has normal reflexes. She exhibits normal muscle tone. Coordination normal.  Skin: Skin is warm and dry. She is not diaphoretic. No erythema. No pallor.  Psychiatric: She has a normal mood and affect. Her behavior is normal. Judgment and thought content normal.          Assessment & Plan:   No problem-specific assessment & plan notes found for this encounter. PROSTHETIC JOINT COMPLICATION Check esr, crp today. If still normal observe OFF of bactrim  BACTEREMIA Successfully treated   METHICILLIN RESISTANT STAPHYLOCOCCUS AUREUS INFECTION Was apparent culprit in her prosthetic joint infection

## 2010-08-03 NOTE — Assessment & Plan Note (Signed)
Successfully treated 

## 2010-08-03 NOTE — Assessment & Plan Note (Signed)
Check esr, crp today. If still normal observe OFF of bactrim

## 2010-08-03 NOTE — Assessment & Plan Note (Signed)
Was apparent culprit in her prosthetic joint infection

## 2010-08-04 LAB — BASIC METABOLIC PANEL WITH GFR
CO2: 28 mEq/L (ref 19–32)
Calcium: 9.1 mg/dL (ref 8.4–10.5)
Glucose, Bld: 82 mg/dL (ref 70–99)
Sodium: 140 mEq/L (ref 135–145)

## 2010-08-05 ENCOUNTER — Encounter: Payer: Self-pay | Admitting: Cardiology

## 2010-08-12 ENCOUNTER — Ambulatory Visit (INDEPENDENT_AMBULATORY_CARE_PROVIDER_SITE_OTHER): Payer: Worker's Compensation | Admitting: *Deleted

## 2010-08-12 DIAGNOSIS — I82409 Acute embolism and thrombosis of unspecified deep veins of unspecified lower extremity: Secondary | ICD-10-CM

## 2010-08-12 LAB — POCT INR: INR: 3.8

## 2010-08-17 ENCOUNTER — Ambulatory Visit (INDEPENDENT_AMBULATORY_CARE_PROVIDER_SITE_OTHER): Payer: Worker's Compensation | Admitting: Nurse Practitioner

## 2010-08-17 ENCOUNTER — Encounter: Payer: Self-pay | Admitting: Nurse Practitioner

## 2010-08-17 DIAGNOSIS — I10 Essential (primary) hypertension: Secondary | ICD-10-CM

## 2010-08-17 NOTE — Progress Notes (Signed)
[name] Date of Birth: 07-12-1955   History of Present Illness: Mia [name] is seen back today for a 2 week check. She is seen for Dr. Patty Sermons. I placed her on Tenex about 2 weeks ago. Blood pressure is better. She did not pass her functional testing for determination of returning to work. She remains anxious and upset. She is not able to exercise due to the knee. She does feel a little better and is tolerating the Tenex.   Current Outpatient Prescriptions on File Prior to Visit  Medication Sig Dispense Refill  . colchicine 0.6 MG tablet Take 0.6 mg by mouth daily.        Marland Kitchen ezetimibe (ZETIA) 10 MG tablet Take 10 mg by mouth daily.        . furosemide (LASIX) 40 MG tablet 40 mg daily.        Marland Kitchen guanFACINE (TENEX) 1 MG tablet Take 1 tablet (1 mg total) by mouth at bedtime.  30 tablet  6  . HYDROcodone-acetaminophen (NORCO) 5-325 MG per tablet Take 1 tablet by mouth every 6 (six) hours as needed.        . methocarbamol (ROBAXIN) 500 MG tablet Take 500 mg by mouth every 6 (six) hours as needed. As needed for muscle spasm       . pantoprazole (PROTONIX) 40 MG tablet Take 40 mg by mouth daily.        . polyethylene glycol (MIRALAX) powder Take 17 g by mouth 2 (two) times a week. Use as directed for constipation      . pravastatin (PRAVACHOL) 40 MG tablet Take 40 mg by mouth daily.        Marland Kitchen telmisartan (MICARDIS) 80 MG tablet Take 80 mg by mouth daily.        Marland Kitchen warfarin (COUMADIN) 5 MG tablet Take as directed by Anticoagulation clinic Pt takes up to 1 1/2 tablets daily  45 tablet  11  . sulfamethoxazole-trimethoprim (BACTRIM DS) 800-160 MG per tablet Take 1 tablet by mouth 2 (two) times daily.  60 tablet  6    Allergies  Allergen Reactions  . Amlodipine     Edema,ha  . Crestor (Rosuvastatin Calcium)     Chest pain  . Zosyn     Past Medical History  Diagnosis Date  . Hypertension   . Morbid obesity   . Nonsmoker   . Pulmonary embolism 2010    following right knee  replacement  . DVT (deep venous thrombosis)     right upper extremitiy  . Chronic anticoagulation   . Infected prosthetic knee joint   . H/O: GI bleed   . Anemia   . IBS (irritable bowel syndrome)   . Hyperlipidemia   . WPW (Wolff-Parkinson-White syndrome)     Past Surgical History  Procedure Date  . Right knee replacement 2010    followed by I & D for infection  . Cholecystectomy     History  Smoking status  . Never Smoker   Smokeless tobacco  . Never Used    History  Alcohol Use No    No family history on file.  Review of Systems: The review of systems is positive for anxiety. She has chronic knee pain. She continues to have trouble with walking and has almost fallen again recently.  All other systems were reviewed and are negative.  Physical Exam: BP 128/86  Pulse 72  Wt 367 lb 6.4 oz (166.652 kg)  LMP 04/10/2010 Patient is very  pleasant and in no acute distress. She is morbidly obese. Skin is warm and dry. Color is normal.  HEENT is unremarkable. Normocephalic/atraumatic. PERRL. Sclera are nonicteric. Neck is supple. No masses. No JVD. Lungs are clear. Cardiac exam shows a regular rate and rhythm. Abdomen is soft. Extremities are without edema. Gait and ROM are intact. No gross neurologic deficits noted.  LABORATORY DATA:   Assessment / Plan:

## 2010-08-17 NOTE — Patient Instructions (Signed)
Stay on your current medicines We will see you in September as planned. Call for any problems.

## 2010-08-17 NOTE — Assessment & Plan Note (Signed)
Blood pressure is better. I have left her on her current regimen. She will see Dr. Patty Sermons as scheduled next month. Patient is agreeable to this plan and will call if any problems develop in the interim.

## 2010-09-01 ENCOUNTER — Emergency Department (HOSPITAL_COMMUNITY)
Admission: EM | Admit: 2010-09-01 | Discharge: 2010-09-02 | Disposition: A | Discharge status: Home / Self Care | Payer: Worker's Compensation | Attending: Emergency Medicine | Admitting: Emergency Medicine

## 2010-09-01 DIAGNOSIS — W19XXXA Unspecified fall, initial encounter: Secondary | ICD-10-CM | POA: Insufficient documentation

## 2010-09-01 DIAGNOSIS — S93609A Unspecified sprain of unspecified foot, initial encounter: Secondary | ICD-10-CM | POA: Insufficient documentation

## 2010-09-01 DIAGNOSIS — Z86718 Personal history of other venous thrombosis and embolism: Secondary | ICD-10-CM | POA: Insufficient documentation

## 2010-09-01 DIAGNOSIS — M7989 Other specified soft tissue disorders: Secondary | ICD-10-CM | POA: Insufficient documentation

## 2010-09-01 DIAGNOSIS — Z79899 Other long term (current) drug therapy: Secondary | ICD-10-CM | POA: Insufficient documentation

## 2010-09-01 DIAGNOSIS — E785 Hyperlipidemia, unspecified: Secondary | ICD-10-CM | POA: Insufficient documentation

## 2010-09-01 DIAGNOSIS — I10 Essential (primary) hypertension: Secondary | ICD-10-CM | POA: Insufficient documentation

## 2010-09-01 DIAGNOSIS — M79609 Pain in unspecified limb: Secondary | ICD-10-CM | POA: Insufficient documentation

## 2010-09-01 DIAGNOSIS — K589 Irritable bowel syndrome without diarrhea: Secondary | ICD-10-CM | POA: Insufficient documentation

## 2010-09-01 DIAGNOSIS — M25569 Pain in unspecified knee: Secondary | ICD-10-CM | POA: Insufficient documentation

## 2010-09-02 ENCOUNTER — Encounter: Payer: Self-pay | Admitting: *Deleted

## 2010-09-02 ENCOUNTER — Emergency Department (HOSPITAL_COMMUNITY): Payer: Worker's Compensation

## 2010-09-05 ENCOUNTER — Emergency Department (HOSPITAL_COMMUNITY)
Admission: EM | Admit: 2010-09-05 | Discharge: 2010-09-05 | Disposition: A | Discharge status: Home / Self Care | Payer: Worker's Compensation | Attending: Emergency Medicine | Admitting: Emergency Medicine

## 2010-09-05 DIAGNOSIS — Z7901 Long term (current) use of anticoagulants: Secondary | ICD-10-CM | POA: Insufficient documentation

## 2010-09-05 DIAGNOSIS — L02619 Cutaneous abscess of unspecified foot: Secondary | ICD-10-CM | POA: Insufficient documentation

## 2010-09-05 DIAGNOSIS — L03119 Cellulitis of unspecified part of limb: Secondary | ICD-10-CM | POA: Insufficient documentation

## 2010-09-05 DIAGNOSIS — Z86718 Personal history of other venous thrombosis and embolism: Secondary | ICD-10-CM | POA: Insufficient documentation

## 2010-09-05 DIAGNOSIS — I10 Essential (primary) hypertension: Secondary | ICD-10-CM | POA: Insufficient documentation

## 2010-09-05 DIAGNOSIS — E785 Hyperlipidemia, unspecified: Secondary | ICD-10-CM | POA: Insufficient documentation

## 2010-09-05 DIAGNOSIS — M79609 Pain in unspecified limb: Secondary | ICD-10-CM | POA: Insufficient documentation

## 2010-09-12 ENCOUNTER — Emergency Department (HOSPITAL_COMMUNITY)
Admission: EM | Admit: 2010-09-12 | Discharge: 2010-09-13 | Disposition: A | Discharge status: Home / Self Care | Payer: Worker's Compensation | Attending: Emergency Medicine | Admitting: Emergency Medicine

## 2010-09-12 ENCOUNTER — Telehealth: Payer: Self-pay | Admitting: Physician Assistant

## 2010-09-12 ENCOUNTER — Emergency Department (HOSPITAL_COMMUNITY): Payer: Worker's Compensation

## 2010-09-12 DIAGNOSIS — E785 Hyperlipidemia, unspecified: Secondary | ICD-10-CM | POA: Insufficient documentation

## 2010-09-12 DIAGNOSIS — Z7901 Long term (current) use of anticoagulants: Secondary | ICD-10-CM | POA: Insufficient documentation

## 2010-09-12 DIAGNOSIS — N12 Tubulo-interstitial nephritis, not specified as acute or chronic: Secondary | ICD-10-CM | POA: Insufficient documentation

## 2010-09-12 DIAGNOSIS — R791 Abnormal coagulation profile: Secondary | ICD-10-CM | POA: Insufficient documentation

## 2010-09-12 DIAGNOSIS — M549 Dorsalgia, unspecified: Secondary | ICD-10-CM | POA: Insufficient documentation

## 2010-09-12 DIAGNOSIS — T45515A Adverse effect of anticoagulants, initial encounter: Secondary | ICD-10-CM | POA: Insufficient documentation

## 2010-09-12 DIAGNOSIS — N898 Other specified noninflammatory disorders of vagina: Secondary | ICD-10-CM | POA: Insufficient documentation

## 2010-09-12 DIAGNOSIS — Z86718 Personal history of other venous thrombosis and embolism: Secondary | ICD-10-CM | POA: Insufficient documentation

## 2010-09-12 DIAGNOSIS — R319 Hematuria, unspecified: Secondary | ICD-10-CM | POA: Insufficient documentation

## 2010-09-12 DIAGNOSIS — I10 Essential (primary) hypertension: Secondary | ICD-10-CM | POA: Insufficient documentation

## 2010-09-12 LAB — DIFFERENTIAL
Eosinophils Absolute: 0 10*3/uL (ref 0.0–0.7)
Eosinophils Relative: 0 % (ref 0–5)
Lymphocytes Relative: 19 % (ref 12–46)
Lymphs Abs: 2.7 10*3/uL (ref 0.7–4.0)
Monocytes Absolute: 1.4 10*3/uL — ABNORMAL HIGH (ref 0.1–1.0)
Monocytes Relative: 10 % (ref 3–12)

## 2010-09-12 LAB — COMPREHENSIVE METABOLIC PANEL
ALT: 9 U/L (ref 0–35)
AST: 12 U/L (ref 0–37)
Alkaline Phosphatase: 97 U/L (ref 39–117)
Calcium: 9.2 mg/dL (ref 8.4–10.5)
Potassium: 3.5 mEq/L (ref 3.5–5.1)
Sodium: 138 mEq/L (ref 135–145)
Total Protein: 7.9 g/dL (ref 6.0–8.3)

## 2010-09-12 LAB — URINALYSIS, ROUTINE W REFLEX MICROSCOPIC
Nitrite: NEGATIVE
Protein, ur: >300 mg/dL — AB
Specific Gravity, Urine: 1.027 (ref 1.005–1.030)
Urobilinogen, UA: 0.2 mg/dL (ref 0.0–1.0)

## 2010-09-12 LAB — GLUCOSE, CAPILLARY: Glucose-Capillary: 124 mg/dL — ABNORMAL HIGH (ref 70–99)

## 2010-09-12 LAB — CBC
HCT: 38.8 % (ref 36.0–46.0)
MCH: 25.6 pg — ABNORMAL LOW (ref 26.0–34.0)
MCHC: 32.5 g/dL (ref 30.0–36.0)
MCV: 78.7 fL (ref 78.0–100.0)
RDW: 15.9 % — ABNORMAL HIGH (ref 11.5–15.5)

## 2010-09-12 LAB — URINE MICROSCOPIC-ADD ON

## 2010-09-12 NOTE — Telephone Encounter (Signed)
[name] is a 55 y.o. female followed by Dr. Patty Sermons.  She has a h/o DVT on coumadin and HTN.  She has been placed on Ibuprofen recently for a leg injury and clindamycin for an infection.  She has noted abdominal pain and hematuria for the last 2 days.  She feels weak.  She felt lightheaded with standing as well.  She denies chest pain or dyspnea.  No fevers.  No syncope.  She also notes dark stools. No BRBPR.  She denies hematemesis.    I have recommended that the patient go to the ED.  I am concerned that she is having significant bleeding with her hematuria.  I cannot r/o that she is having a GI bleed with her dark stools.  She does have a h/o GI bleeding.  She is on coumadin, ibuprofen and clindamycin.    She agreed and will go to the nearest ED. Tereso Newcomer, PA-C

## 2010-09-14 ENCOUNTER — Telehealth: Payer: Self-pay | Admitting: *Deleted

## 2010-09-14 ENCOUNTER — Ambulatory Visit (INDEPENDENT_AMBULATORY_CARE_PROVIDER_SITE_OTHER): Payer: Worker's Compensation | Admitting: *Deleted

## 2010-09-14 ENCOUNTER — Telehealth: Payer: Self-pay | Admitting: Cardiology

## 2010-09-14 DIAGNOSIS — R899 Unspecified abnormal finding in specimens from other organs, systems and tissues: Secondary | ICD-10-CM

## 2010-09-14 DIAGNOSIS — I82409 Acute embolism and thrombosis of unspecified deep veins of unspecified lower extremity: Secondary | ICD-10-CM

## 2010-09-14 LAB — URINE CULTURE: Colony Count: 30000

## 2010-09-14 LAB — POCT INR: INR: 6.5

## 2010-09-14 MED ORDER — PHYTONADIONE 5 MG PO TABS: ORAL_TABLET | ORAL | Status: DC

## 2010-09-14 NOTE — Telephone Encounter (Signed)
Patient in ED 9/8  and INR was 7 and Rx for cephalexin 500 mg four x a day.  Check today.

## 2010-09-14 NOTE — Telephone Encounter (Signed)
Agree with plan 

## 2010-09-14 NOTE — Telephone Encounter (Signed)
Pt states was seen in at [facility] ER on Saturday for bleeding.  Pt states Dr. Tereso Newcomer saw her and stated her INR was at a 7.  Pt states was bleeding a lot and the doctor recommended she call to get in today to be seen by Dr. Patty Sermons.  She states she was given a K-Pill but the bleeding has not slowed down at all.  Please call pt back. Chart in box.

## 2010-09-14 NOTE — Telephone Encounter (Signed)
INR over 7 in hospital, coming here today for INR check

## 2010-09-15 NOTE — Patient Instructions (Signed)
Advise patient no coumadin until recheck.  Also called [facility] and Rx'd Vit K 5 mg on 9/10 & 9/11, recheck 9/12.  Per  Dr. Patty Sermons

## 2010-09-16 ENCOUNTER — Ambulatory Visit (INDEPENDENT_AMBULATORY_CARE_PROVIDER_SITE_OTHER): Payer: Worker's Compensation | Admitting: *Deleted

## 2010-09-16 DIAGNOSIS — I82409 Acute embolism and thrombosis of unspecified deep veins of unspecified lower extremity: Secondary | ICD-10-CM

## 2010-09-21 ENCOUNTER — Ambulatory Visit (INDEPENDENT_AMBULATORY_CARE_PROVIDER_SITE_OTHER): Payer: Worker's Compensation | Admitting: *Deleted

## 2010-09-21 DIAGNOSIS — I82409 Acute embolism and thrombosis of unspecified deep veins of unspecified lower extremity: Secondary | ICD-10-CM

## 2010-09-21 LAB — POCT INR: INR: 2.3

## 2010-09-23 ENCOUNTER — Encounter: Payer: Self-pay | Admitting: Infectious Disease

## 2010-09-23 ENCOUNTER — Ambulatory Visit (INDEPENDENT_AMBULATORY_CARE_PROVIDER_SITE_OTHER): Payer: Worker's Compensation | Admitting: Infectious Disease

## 2010-09-23 VITALS — BP 138/89 | HR 89 | Temp 98.2°F | Wt 338.0 lb

## 2010-09-23 DIAGNOSIS — A4902 Methicillin resistant Staphylococcus aureus infection, unspecified site: Secondary | ICD-10-CM

## 2010-09-23 DIAGNOSIS — L0291 Cutaneous abscess, unspecified: Secondary | ICD-10-CM

## 2010-09-23 DIAGNOSIS — T8489XA Other specified complication of internal orthopedic prosthetic devices, implants and grafts, initial encounter: Secondary | ICD-10-CM

## 2010-09-23 MED ORDER — CLINDAMYCIN HCL 300 MG PO CAPS: 300.0000 mg | ORAL_CAPSULE | Freq: Four times a day (QID) | ORAL | Status: DC

## 2010-09-23 MED ORDER — HYDROCODONE-ACETAMINOPHEN 5-325 MG PO TABS: 1.0000 | ORAL_TABLET | Freq: Four times a day (QID) | ORAL | Status: DC | PRN

## 2010-09-23 NOTE — Assessment & Plan Note (Signed)
Patient has been off of chronic suppressive therapy she has some swelling on her medial aspect of her lower leg but the knee itself does not appear terribly edema this is not tender or fluctuant. Will check a sedimentation rate and C-reactive protein today.

## 2010-09-23 NOTE — Assessment & Plan Note (Signed)
Informed consent was obtained the patient after risks and benefits were clearly explained. Her left foot was prepped in the usual sterile manner. 2% lidocaine was used to infiltrate the skin near the area where she had had drainage from before. Scalpel was used to make an incision of approximately 3-4 mm in length. Blood and blood mixed with some cloudy potentially purulent material was milked from the wound. The incision did bleed vigorously and required pressure  bandages to stop the bleeding. She had minimal blood loss.   Certainly this is something that looks consistent with an abscess likely with methicillin-resistant staph aureus. Alternatively it could be a hematoma. The clindamycin which she is on should be sufficient to cover this her MRSA was sensitive to clindamycin when it was cultured from her knee wound. She should stop her cephalexin. I gave her some Vicodin for the pain

## 2010-09-23 NOTE — Progress Notes (Signed)
Subjective:    Patient ID: Mia [name], female    DOB: 03-01-1955, 55 y.o.   MRN: [id number]  HPI  55 year old with recurrent prosthetic knee infection with MRSA sp repeat I and D in January with exchange of polyethylene and washout by Dr Ranell Patrick. She was being treated with daptomycin due to concern about a rxn to vancomycin. During her stay she developed allergic rash on zosyn and cefepime. She was on the daptomycin and developed a PICC line recenlty >N infection with e coli and klebsiella and was treated with cipro which she is still finshing. Patients is having more pain after PT. She was having pain behind the knee and behind the knee. She was seen in the ED due to swelling in entire leg Her pain was so severe that she could not tolerate the water in her shower touching her toe. Dr. Hyacinth Meeker gave s empirical rx for gout with colchicine and prednisone. Leg pain and swelling improved. She still hadpersistent knee pain however and has been seen by Dr. Ranell Patrick who ordered doppler of her leg and tagged wbc. Tagged white blood cell scan study was negative for infection. Radiology  suggested colloid study if there were still high clinical suspicion. When I checked inflammatory markers in May her ESR was 1, And CRP was normal as well. I had her continue the bactrim but this was discontinued minor scanning wasn't was due to her having this type of treatment the patient actually never filled the Bactrim again due to concerns of pharmacy about interactions with Coumadin. Since I last saw her he sustained a fall with her right knee giving out from under her. She then developed a large edematous . area on her left foot suspicious for an abscess. This drained a combination of blood and pus and she was seen for this in the emergency department she was. She was placed on clindamycin and discharged. Apparently hurt her edema and swelling and pain improved but still she had persistence of swelling and an area of dark  skin on the left foot that worsened with her not keeping her leg elevated. She was seen by orthopedic surgery and also by cardiology who placed the patient on Keflex in addition to the clindamycin. She continues to have pain and swelling there and presents for followup with me today. In all we spent greater than 45 minutes witht he patient including greater than 50% of time in counselling the patient face to face and in coordination of care. Review of Systems  Constitutional: Negative for fever, chills, diaphoresis, activity change, appetite change, fatigue and unexpected weight change.  HENT: Negative for congestion, sore throat, rhinorrhea, sneezing, trouble swallowing and sinus pressure.   Eyes: Negative for photophobia and visual disturbance.  Respiratory: Negative for cough, chest tightness, shortness of breath, wheezing and stridor.   Cardiovascular: Negative for chest pain, palpitations and leg swelling.  Gastrointestinal: Negative for nausea, vomiting, abdominal pain, diarrhea, constipation, blood in stool, abdominal distention and anal bleeding.  Genitourinary: Negative for dysuria, hematuria, flank pain and difficulty urinating.  Musculoskeletal: Positive for joint swelling, arthralgias and gait problem. Negative for myalgias and back pain.  Skin: Positive for wound. Negative for color change, pallor and rash.  Neurological: Negative for dizziness, tremors, weakness and light-headedness.  Hematological: Negative for adenopathy. Does not bruise/bleed easily.  Psychiatric/Behavioral: Negative for behavioral problems, confusion, sleep disturbance, dysphoric mood, decreased concentration and agitation.       Objective:   Physical Exam  Constitutional: She  is oriented to person, place, and time. She appears well-developed and well-nourished. No distress.  HENT:  Head: Normocephalic and atraumatic.  Mouth/Throat: Oropharynx is clear and moist. No oropharyngeal exudate.  Eyes: Conjunctivae  and EOM are normal. Pupils are equal, round, and reactive to light. No scleral icterus.  Neck: Normal range of motion. Neck supple. No JVD present.  Cardiovascular: Normal rate, regular rhythm and normal heart sounds.  Exam reveals no gallop and no friction rub.   No murmur heard. Pulmonary/Chest: Effort normal and breath sounds normal. No respiratory distress. She has no wheezes. She has no rales. She exhibits no tenderness.  Abdominal: She exhibits no distension and no mass. There is no tenderness. There is no rebound and no guarding.  Musculoskeletal: She exhibits no edema and no tenderness.       Legs: Lymphadenopathy:    She has no cervical adenopathy.  Neurological: She is alert and oriented to person, place, and time. She has normal reflexes. She exhibits normal muscle tone. Coordination normal.  Skin: Skin is warm and dry. She is not diaphoretic. There is erythema. No pallor.  Psychiatric: She has a normal mood and affect. Her behavior is normal. Judgment and thought content normal.          Assessment & Plan:

## 2010-09-24 LAB — CBC WITH DIFFERENTIAL/PLATELET
Basophils Relative: 0 % (ref 0–1)
Eosinophils Absolute: 0 10*3/uL (ref 0.0–0.7)
Hemoglobin: 12.4 g/dL (ref 12.0–15.0)
MCH: 25.4 pg — ABNORMAL LOW (ref 26.0–34.0)
MCHC: 31.6 g/dL (ref 30.0–36.0)
Monocytes Relative: 7 % (ref 3–12)
Neutrophils Relative %: 65 % (ref 43–77)
RDW: 17.2 % — ABNORMAL HIGH (ref 11.5–15.5)

## 2010-09-24 LAB — BASIC METABOLIC PANEL WITH GFR
CO2: 25 mEq/L (ref 19–32)
Calcium: 9.9 mg/dL (ref 8.4–10.5)
Creat: 0.71 mg/dL (ref 0.50–1.10)
Glucose, Bld: 83 mg/dL (ref 70–99)

## 2010-09-24 LAB — SEDIMENTATION RATE: Sed Rate: 27 mm/hr — ABNORMAL HIGH (ref 0–22)

## 2010-09-25 ENCOUNTER — Telehealth: Payer: Self-pay | Admitting: *Deleted

## 2010-09-25 NOTE — Telephone Encounter (Signed)
Patient called as a follow up to the Wednesday appointment she recently had with [place]. She says that the area on her foot that was cut to allow drainage was still draining and she wanted to know how to care for it and what to look for. Adv her to change the dressing at least once a day depending on the drainage and watch the area for redness and swelling and increased pain. Also advised her to pay attention to the smell and color and if there was any cause for concern to give [place] a call back. She also asked for her results of the wound culture she got and advised her that it was not resulted and to give [place] a call back on Monday.

## 2010-09-26 LAB — WOUND CULTURE
Gram Stain: NONE SEEN
Organism ID, Bacteria: NO GROWTH

## 2010-09-30 ENCOUNTER — Telehealth: Payer: Self-pay | Admitting: *Deleted

## 2010-09-30 ENCOUNTER — Ambulatory Visit (INDEPENDENT_AMBULATORY_CARE_PROVIDER_SITE_OTHER): Payer: Worker's Compensation | Admitting: *Deleted

## 2010-09-30 ENCOUNTER — Encounter: Payer: Self-pay | Admitting: Cardiology

## 2010-09-30 ENCOUNTER — Ambulatory Visit (INDEPENDENT_AMBULATORY_CARE_PROVIDER_SITE_OTHER): Payer: Worker's Compensation | Admitting: Cardiology

## 2010-09-30 DIAGNOSIS — I82409 Acute embolism and thrombosis of unspecified deep veins of unspecified lower extremity: Secondary | ICD-10-CM

## 2010-09-30 DIAGNOSIS — Z7901 Long term (current) use of anticoagulants: Secondary | ICD-10-CM

## 2010-09-30 DIAGNOSIS — E7800 Pure hypercholesterolemia, unspecified: Secondary | ICD-10-CM

## 2010-09-30 DIAGNOSIS — I10 Essential (primary) hypertension: Secondary | ICD-10-CM

## 2010-09-30 DIAGNOSIS — M109 Gout, unspecified: Secondary | ICD-10-CM

## 2010-09-30 DIAGNOSIS — W19XXXA Unspecified fall, initial encounter: Secondary | ICD-10-CM | POA: Insufficient documentation

## 2010-09-30 NOTE — Patient Instructions (Addendum)
Continue same medication.  Continue on careful diet. Continue on same Coumadin

## 2010-09-30 NOTE — Assessment & Plan Note (Signed)
Patient has a history of hypertension.  She has not been experiencing any recent chest pain or increased dyspnea.

## 2010-09-30 NOTE — Progress Notes (Signed)
Mia [name] Date of Birth:  [birth year] [facility] [address]   [address] [place], [place]  [address] [phone]           Fax   [phone]  History of Present Illness: This pleasant 55 year old, African American woman is seen for a scheduled followup office visit.  He has a past history of essential hypertension and morbid obesity.  She also has a history of pulmonary embolism following right knee replacement in 2010 and a history of deep vein thrombosis.  She is on chronic Coumadin anticoagulation.  She has an infected right prosthetic knee joint with MRSA.  He has a history of irritable bowel syndrome, and hyperlipidemia.  He is status post cholecystectomy.  Current Outpatient Prescriptions  Medication Sig Dispense Refill  . clindamycin (CLEOCIN) 300 MG capsule Take 1 capsule (300 mg total) by mouth 4 (four) times daily.  40 capsule  1  . colchicine 0.6 MG tablet Take 0.6 mg by mouth daily.        Marland Kitchen ezetimibe (ZETIA) 10 MG tablet Take 10 mg by mouth daily.        . furosemide (LASIX) 40 MG tablet 40 mg daily.        Marland Kitchen guanFACINE (TENEX) 1 MG tablet Take 1 mg by mouth at bedtime.        Marland Kitchen HYDROcodone-acetaminophen (NORCO) 5-325 MG per tablet Take 1 tablet by mouth every 6 (six) hours as needed.  30 tablet  0  . polyethylene glycol (MIRALAX) powder Take 17 g by mouth 2 (two) times a week. Use as directed for constipation      . pravastatin (PRAVACHOL) 40 MG tablet Take 40 mg by mouth daily.        Marland Kitchen telmisartan (MICARDIS) 80 MG tablet Take 80 mg by mouth daily.        Marland Kitchen warfarin (COUMADIN) 5 MG tablet Take as directed by Anticoagulation clinic Pt takes up to 1 1/2 tablets daily  45 tablet  11  . sulfamethoxazole-trimethoprim (BACTRIM DS) 800-160 MG per tablet Take 1 tablet by mouth 2 (two) times daily.  60 tablet  6    Allergies  Allergen Reactions  . Amlodipine     Edema,ha  . Crestor (Rosuvastatin Calcium)     Chest pain  . Zosyn      Patient Active Problem List  Diagnoses  . METHICILLIN RESISTANT STAPHYLOCOCCUS AUREUS INFECTION  . KLEBSIELLA PNEUMONIAE INFECTION  . BACTEREMIA  . PROSTHETIC JOINT COMPLICATION  . Essential hypertension  . DVT (deep venous thrombosis)  . DVT (deep venous thrombosis)  . Hair loss  . Gout  . Leg swelling  . HTN (hypertension)  . Chronic anticoagulation  . Other pulmonary embolism and infarction  . Abscess  . Fall    History  Smoking status  . Never Smoker   Smokeless tobacco  . Never Used    History  Alcohol Use No    No family history on file.  Review of Systems: Constitutional: no fever chills diaphoresis or fatigue or change in weight.  Head and neck: no hearing loss, no epistaxis, no photophobia or visual disturbance. Respiratory: No cough, shortness of breath or wheezing. Cardiovascular: No chest pain peripheral edema, palpitations. Gastrointestinal: No abdominal distention, no abdominal pain, no change in bowel habits hematochezia or melena. Genitourinary: No dysuria, no frequency, no urgency, no nocturia. Musculoskeletal:No arthralgias, no back pain, no gait disturbance or myalgias. Neurological: No dizziness, no headaches, no numbness, no seizures,  no syncope, no weakness, no tremors. Hematologic: No lymphadenopathy, no easy bruising. Psychiatric: No confusion, no hallucinations, no sleep disturbance.    Physical Exam: Filed Vitals:   09/30/10 1034  BP: 136/88  Pulse: 78   General appearance reveals a moderately obese, African American woman in no acute distress.    Pupils equal and reactive.   Extraocular Movements are full.  There is no scleral icterus.  The mouth and pharynx are normal.  The neck is supple.  The carotids reveal no bruits.  The jugular venous pressure is normal.  The thyroid is not enlarged.  There is no lymphadenopathy.  The chest is clear to percussion and auscultation. There are no rales or rhonchi. Expansion of the chest is  symmetrical.  The precordium is quiet.  The first heart sound is normal.  The second heart sound is physiologically split.  There is no murmur gallop rub or click.  There is no abnormal lift or heave.  The abdomen is obese and nontender.  No organomegaly or mass.  Extremities reveal a scar over the right knee, which appears to be healing well.  There is still some warmth on the medial side of the right knee.  She has a new dressing and splint on the left foot and ankle representing the effect of her recent fall and questionable cellulitis now present in the left foot.  I did not remove the bandage.Strength is normal and symmetrical in all extremities.  There is no lateralizing weakness.  There are no sensory deficits.    Assessment / Plan: The patient will continue same medication.  She will continue under the close care of her orthopedist and  infectious disease.  Recheck here in 3 months for a followup office visit and fasting lab work.  She is also working on getting a primary care physician.

## 2010-09-30 NOTE — Assessment & Plan Note (Signed)
Patient fell about 4 weeks ago, injuring her left leg.  This was her good leg.  There is a question as to whether the infection may have spread from the right knee to the left leg.  She has been seen by [facility], orthopedics.  Further surgery may be necessary on the left leg and foot.  She continues to be followed closely by Dr. Algis Liming of infectious disease.  She is no longer on any IV antibiotics.  She is on clindamycin by mouth.

## 2010-09-30 NOTE — Assessment & Plan Note (Signed)
The patient has been on long-term Coumadin because of her previous history of pulmonary embolism and deep vein thrombosis.  Her INR is 2.2 today and she will continue same dose of Coumadin and be rechecked in one month

## 2010-09-30 NOTE — Telephone Encounter (Signed)
States she saw 2 mds today & is being referred to a foot doctor. Wants the labs to take to this md. Told her to come here or to that md & sign a release & we can send the records there. She agreed with this

## 2010-09-30 NOTE — Assessment & Plan Note (Signed)
The patient has a past history of gout.  She has had no recent flareup of acute gout.

## 2010-09-30 NOTE — Assessment & Plan Note (Signed)
The patient remains on long-term Coumadin because of her past history of deep vein thrombosis.  She had slight hematuria yesterday, but none today.  Her INR today is therapeutic at 2.2

## 2010-10-02 ENCOUNTER — Encounter: Payer: Self-pay | Admitting: *Deleted

## 2010-10-02 ENCOUNTER — Ambulatory Visit: Payer: Self-pay | Admitting: Cardiology

## 2010-10-05 ENCOUNTER — Telehealth: Payer: Self-pay | Admitting: *Deleted

## 2010-10-05 NOTE — Telephone Encounter (Signed)
She wanted to know what the foot culture results were. I discussed them with her. She saw Dr. Ranell Patrick with [facility] & he cleaned it. She is being sent to a foot md by ortho. States it is doing better

## 2010-10-15 LAB — CBC
RBC: 5.22 — ABNORMAL HIGH
WBC: 10.5

## 2010-10-15 LAB — BASIC METABOLIC PANEL
CO2: 28
Chloride: 103
GFR calc Af Amer: >60
Sodium: 138

## 2010-10-23 ENCOUNTER — Ambulatory Visit (INDEPENDENT_AMBULATORY_CARE_PROVIDER_SITE_OTHER): Payer: Worker's Compensation | Admitting: *Deleted

## 2010-10-23 ENCOUNTER — Telehealth: Payer: Self-pay | Admitting: Cardiology

## 2010-10-23 DIAGNOSIS — Z7901 Long term (current) use of anticoagulants: Secondary | ICD-10-CM

## 2010-10-23 DIAGNOSIS — I2699 Other pulmonary embolism without acute cor pulmonale: Secondary | ICD-10-CM

## 2010-10-23 DIAGNOSIS — I82409 Acute embolism and thrombosis of unspecified deep veins of unspecified lower extremity: Secondary | ICD-10-CM

## 2010-10-23 NOTE — Telephone Encounter (Signed)
Pt seen in clinic see EPIC anticoag note.

## 2010-10-23 NOTE — Telephone Encounter (Signed)
Appt made for this afternoon to check INR.

## 2010-10-23 NOTE — Telephone Encounter (Signed)
Pt calling regarding pt coumadin. Pt said she hasn't had period 10 months and pt is c/o bleeding out vagina every time she urinates. Pt doesn't know if it is her period or if the change in coumadin dosage is causing the bleeding. Pt call transferred to triage.

## 2010-10-26 ENCOUNTER — Ambulatory Visit (INDEPENDENT_AMBULATORY_CARE_PROVIDER_SITE_OTHER): Payer: Worker's Compensation | Admitting: Infectious Disease

## 2010-10-26 ENCOUNTER — Encounter: Payer: Self-pay | Admitting: *Deleted

## 2010-10-26 ENCOUNTER — Encounter: Payer: Self-pay | Admitting: Infectious Disease

## 2010-10-26 DIAGNOSIS — A4902 Methicillin resistant Staphylococcus aureus infection, unspecified site: Secondary | ICD-10-CM

## 2010-10-26 DIAGNOSIS — R600 Localized edema: Secondary | ICD-10-CM | POA: Insufficient documentation

## 2010-10-26 DIAGNOSIS — R609 Edema, unspecified: Secondary | ICD-10-CM

## 2010-10-26 DIAGNOSIS — M109 Gout, unspecified: Secondary | ICD-10-CM

## 2010-10-26 DIAGNOSIS — T8450XA Infection and inflammatory reaction due to unspecified internal joint prosthesis, initial encounter: Secondary | ICD-10-CM

## 2010-10-26 DIAGNOSIS — Z96659 Presence of unspecified artificial knee joint: Secondary | ICD-10-CM

## 2010-10-26 DIAGNOSIS — T148XXA Other injury of unspecified body region, initial encounter: Secondary | ICD-10-CM

## 2010-10-26 DIAGNOSIS — T8459XA Infection and inflammatory reaction due to other internal joint prosthesis, initial encounter: Secondary | ICD-10-CM | POA: Insufficient documentation

## 2010-10-26 NOTE — Assessment & Plan Note (Signed)
Do decolonization regimen once issues of her knee and foot are resolved

## 2010-10-26 NOTE — Assessment & Plan Note (Signed)
This seems likely to have been a hematoma, possibly with superinfection. I am not terribly suspicious of myositis or fascitis but if this worsenes I would get an MRI of this foot

## 2010-10-26 NOTE — Assessment & Plan Note (Signed)
?   Could be playing a role here at all. The edema in the foot on the left seems too much to be due to gout alone

## 2010-10-26 NOTE — Assessment & Plan Note (Signed)
She has been off suppressive antibiotics and all antibiotics period until rx for her left foot recently. I am bothered that there has been loosenign of the prostheisis and this could be concerning for recurrent infection at this site Sterile aspiration of fluid for cell count, differential, crystals and cullture OFF antibiotics would be way to investigate obviously.. If infection has recurred I would recommend removal of the prostheisis and cultures and 6 weeks of parenterial antibioticss before reimplantaiton. She is to see Lajoyce Corners shorlty.

## 2010-10-26 NOTE — Progress Notes (Signed)
Subjective:    Patient ID: BRANDELYN HENNE, female    DOB: 1955/01/29, 55 y.o.   MRN: [id number]  HPI  55 year old with recurrent prosthetic knee infection with MRSA sp repeat I and D in January with exchange of polyethylene and washout by Dr Ranell Patrick. She was being treated with daptomycin due to concern about a rxn to vancomycin. During her stay she developed allergic rash on zosyn and cefepime. She was on the daptomycin and developed a PICC line recenlty >N infection with e coli and klebsiella and was treated with cipro which she is still finshing. Patients is having more pain after PT. She was having pain behind the knee and behind the knee. She was seen in the ED due to swelling in entire leg Her pain was so severe that she could not tolerate the water in her shower touching her toe. Dr. Hyacinth Meeker gave s empirical rx for gout with colchicine and prednisone. Leg pain and swelling improved. She still hadpersistent knee pain however and has been seen by Dr. Ranell Patrick who ordered doppler of her leg and tagged wbc. Tagged white blood cell scan study was negative for infection. Radiology suggested colloid study if there were still high clinical suspicion. When I checked inflammatory markers in May her ESR was 1, And CRP was normal as well. I had her continue the bactrim but this was discontinued minor scanning wasn't was due to her having this type of treatment the patient actually never filled the Bactrim again due to concerns of pharmacy about interactions with Coumadin. Since I last saw her he sustained a fall with her right knee giving out from under her. She then developed a large edematous . area on her left foot suspicious for an abscess. This drained a combination of blood and pus and she was seen for this in the emergency department she was. She was placed on clindamycin and discharged. Apparently hurt her edema and swelling and pain improved but still she had persistence of swelling and an area of dark skin  on the left foot that worsened with her not keeping her leg elevated. She was seen by orthopedic surgery and also by cardiology who placed the patient on Keflex in addition to the clindamycin. She continued to have pain and swelling there when I saw her last and I performed an I and D of area I expressed largely blood. Cultures failed to yield an organism. She finished course of oral antibiotics (clindamycin( yesterday. She has been seen by Dr. 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 Her right knee prosthesis has apparently loosened recently and she is going to see Lajoyce Corners in the next week or so. She is without fevers, chills, nausea or malaise. She does have chronic pain in her right knee but this has not worsened. It is there with weight bearing and at rest but pattern is unchanged.    Review of Systems  Constitutional: Negative for fever, chills, diaphoresis, activity change, appetite change, fatigue and unexpected weight change.  HENT: Negative for congestion, sore throat, rhinorrhea, sneezing, trouble swallowing and sinus pressure.   Eyes: Negative for photophobia and visual disturbance.  Respiratory: Negative for cough, chest tightness, shortness of breath, wheezing and stridor.   Cardiovascular: Negative for chest pain, palpitations and leg swelling.  Gastrointestinal: Negative for nausea, vomiting, abdominal pain, diarrhea, constipation, blood in stool, abdominal distention and  anal bleeding.  Genitourinary: Negative for dysuria, hematuria, flank pain and difficulty urinating.  Musculoskeletal: Negative for myalgias, back pain, joint swelling, arthralgias and gait problem.  Skin: Negative for color change, pallor, rash and wound.  Neurological: Negative for dizziness, tremors, weakness and light-headedness.  Hematological: Negative for adenopathy. Does not bruise/bleed  easily.  Psychiatric/Behavioral: Negative for behavioral problems, confusion, sleep disturbance, dysphoric mood, decreased concentration and agitation.       Objective:   Physical Exam  Constitutional: She is oriented to person, place, and time. She appears well-developed and well-nourished. No distress.  HENT:  Head: Normocephalic and atraumatic.  Mouth/Throat: Oropharynx is clear and moist. No oropharyngeal exudate.  Eyes: Conjunctivae and EOM are normal. Pupils are equal, round, and reactive to light. No scleral icterus.  Neck: Normal range of motion. Neck supple. No JVD present.  Cardiovascular: Normal rate, regular rhythm and normal heart sounds.  Exam reveals no gallop and no friction rub.   No murmur heard. Pulmonary/Chest: Effort normal and breath sounds normal. No respiratory distress. She has no wheezes. She has no rales. She exhibits no tenderness.  Abdominal: She exhibits no distension and no mass. There is no tenderness. There is no rebound and no guarding.  Musculoskeletal: She exhibits no edema and no tenderness.       Right knee: She exhibits effusion. no tenderness found.       Legs:      Feet:  Lymphadenopathy:    She has no cervical adenopathy.  Neurological: She is alert and oriented to person, place, and time. She has normal reflexes. She exhibits normal muscle tone. Coordination normal.  Skin: Skin is warm and dry. She is not diaphoretic. There is erythema. No pallor.  Psychiatric: She has a normal mood and affect. Her behavior is normal. Judgment and thought content normal.          Assessment & Plan:  Edema of foot This seems likely to have been a hematoma, possibly with superinfection. I am not terribly suspicious of myositis or fascitis but if this worsenes I would get an MRI of this foot  Infection of prosthetic knee joint She has been off suppressive antibiotics and all antibiotics period until rx for her left foot recently. I am bothered that there  has been loosenign of the prostheisis and this could be concerning for recurrent infection at this site Sterile aspiration of fluid for cell count, differential, crystals and cullture OFF antibiotics would be way to investigate obviously.. If infection has recurred I would recommend removal of the prostheisis and cultures and 6 weeks of parenterial antibioticss before reimplantaiton. She is to see Lajoyce Corners shorlty.  Gout ? Could be playing a role here at all. The edema in the foot on the left seems too much to be due to gout alone  METHICILLIN RESISTANT STAPHYLOCOCCUS AUREUS INFECTION Do decolonization regimen once issues of her knee and foot are resolved

## 2010-10-27 ENCOUNTER — Telehealth: Payer: Self-pay | Admitting: *Deleted

## 2010-10-27 NOTE — Telephone Encounter (Signed)
Patient requested that her notes, labs and xray's be sent to [facility] to Dr Ranell Patrick as he is seeing her also and requested she do so. Sent the notes this morning and got confirmation they were received.

## 2010-11-04 ENCOUNTER — Encounter: Payer: Self-pay | Admitting: *Deleted

## 2010-11-04 ENCOUNTER — Telehealth: Payer: Self-pay | Admitting: Cardiology

## 2010-11-04 NOTE — Telephone Encounter (Signed)
Letter done on  Dr. Yevonne Pax desk for him to sign and fax.

## 2010-11-04 NOTE — Telephone Encounter (Signed)
Pt calling re workers comp, on coumadin, was not approved last month, so didn't take it, just now got it again, needs letter to her attorney stating she will always be on coumadin so she won't have a problem getting her coumadin each month, fax [phone] att Rolin Barry

## 2010-11-06 ENCOUNTER — Ambulatory Visit (INDEPENDENT_AMBULATORY_CARE_PROVIDER_SITE_OTHER): Payer: Worker's Compensation | Admitting: *Deleted

## 2010-11-06 ENCOUNTER — Encounter: Payer: Self-pay | Admitting: *Deleted

## 2010-11-06 DIAGNOSIS — I2699 Other pulmonary embolism without acute cor pulmonale: Secondary | ICD-10-CM

## 2010-11-06 DIAGNOSIS — I82409 Acute embolism and thrombosis of unspecified deep veins of unspecified lower extremity: Secondary | ICD-10-CM

## 2010-11-06 DIAGNOSIS — Z7901 Long term (current) use of anticoagulants: Secondary | ICD-10-CM

## 2010-11-06 LAB — POCT INR: INR: 2.1

## 2010-11-27 ENCOUNTER — Telehealth: Payer: Self-pay | Admitting: Cardiology

## 2010-11-27 ENCOUNTER — Ambulatory Visit (INDEPENDENT_AMBULATORY_CARE_PROVIDER_SITE_OTHER): Payer: Worker's Compensation | Admitting: *Deleted

## 2010-11-27 DIAGNOSIS — I82409 Acute embolism and thrombosis of unspecified deep veins of unspecified lower extremity: Secondary | ICD-10-CM

## 2010-11-27 DIAGNOSIS — I2699 Other pulmonary embolism without acute cor pulmonale: Secondary | ICD-10-CM

## 2010-11-27 DIAGNOSIS — Z7901 Long term (current) use of anticoagulants: Secondary | ICD-10-CM

## 2010-11-27 LAB — POCT INR: INR: 3.6

## 2010-11-27 NOTE — Telephone Encounter (Signed)
New problem Pt wants samples of micardis and zetia Please let her know

## 2010-11-27 NOTE — Telephone Encounter (Signed)
Picked up Micardis gathered by [facility]

## 2010-12-11 ENCOUNTER — Ambulatory Visit (INDEPENDENT_AMBULATORY_CARE_PROVIDER_SITE_OTHER): Payer: Worker's Compensation | Admitting: *Deleted

## 2010-12-11 DIAGNOSIS — I82409 Acute embolism and thrombosis of unspecified deep veins of unspecified lower extremity: Secondary | ICD-10-CM

## 2010-12-11 DIAGNOSIS — Z7901 Long term (current) use of anticoagulants: Secondary | ICD-10-CM

## 2010-12-11 DIAGNOSIS — I2699 Other pulmonary embolism without acute cor pulmonale: Secondary | ICD-10-CM

## 2010-12-23 ENCOUNTER — Encounter: Payer: Self-pay | Admitting: Infectious Disease

## 2010-12-23 ENCOUNTER — Ambulatory Visit (INDEPENDENT_AMBULATORY_CARE_PROVIDER_SITE_OTHER): Payer: Worker's Compensation | Admitting: Infectious Disease

## 2010-12-23 ENCOUNTER — Telehealth: Payer: Self-pay | Admitting: Cardiology

## 2010-12-23 VITALS — BP 143/95 | HR 93 | Temp 98.1°F | Wt 336.0 lb

## 2010-12-23 DIAGNOSIS — R609 Edema, unspecified: Secondary | ICD-10-CM

## 2010-12-23 DIAGNOSIS — T8489XA Other specified complication of internal orthopedic prosthetic devices, implants and grafts, initial encounter: Secondary | ICD-10-CM

## 2010-12-23 DIAGNOSIS — R600 Localized edema: Secondary | ICD-10-CM

## 2010-12-23 LAB — BASIC METABOLIC PANEL WITH GFR
BUN: 12 mg/dL (ref 6–23)
Calcium: 9.6 mg/dL (ref 8.4–10.5)
GFR, Est African American: >89 mL/min
GFR, Est Non African American: >89 mL/min
Potassium: 3.8 mEq/L (ref 3.5–5.3)

## 2010-12-23 LAB — CBC WITH DIFFERENTIAL/PLATELET
Basophils Absolute: 0 10*3/uL (ref 0.0–0.1)
Basophils Relative: 0 % (ref 0–1)
Eosinophils Absolute: 0.1 10*3/uL (ref 0.0–0.7)
HCT: 41.7 % (ref 36.0–46.0)
MCHC: 31.7 g/dL (ref 30.0–36.0)
Monocytes Absolute: 0.5 10*3/uL (ref 0.1–1.0)
Neutro Abs: 3.7 10*3/uL (ref 1.7–7.7)
Neutrophils Relative %: 59 % (ref 43–77)
RDW: 16.4 % — ABNORMAL HIGH (ref 11.5–15.5)

## 2010-12-23 LAB — C-REACTIVE PROTEIN: CRP: 0.89 mg/dL — ABNORMAL HIGH (ref <0.60)

## 2010-12-23 LAB — SEDIMENTATION RATE: Sed Rate: 10 mm/hr (ref 0–22)

## 2010-12-23 NOTE — Assessment & Plan Note (Signed)
resolved 

## 2010-12-23 NOTE — Progress Notes (Signed)
Subjective:    Patient ID: Mia [name], female    DOB: July 08, 1955, 55 y.o.   MRN: [id number]  HPI  55 year old with recurrent prosthetic knee infection with MRSA sp repeat I and D in January 2012 with exchange of polyethylene and washout by Dr Ranell Patrick. She was being treated with daptomycin due to concern about a rxn to vancomycin. During her stay she developed allergic rash on zosyn and cefepime. She was on the daptomycin and developed a PICC line recenlty >N infection with e coli and klebsiella and was treated with cipro. She had been off of antibiotics for most of this year until she had trauma with swollen foot that I performed I and D on (was largely a hematoma). She has been seen by Dr. Lestine Box and now Dr. Charlann Boxer for concerns of recurrence of infection in the right knee where she has loosening. Her ESR has come up above 25 now. Per the pt aspirate of her right knee showed 10K wbc in Dr. Boneta Lucks office. She is having more pain with PT now. . She is without fevers, chills, nausea or malaise.   DR. Charlann Boxer , Dr Devonne Doughty  Review of Systems  Constitutional: Negative for fever, chills, diaphoresis, activity change, appetite change, fatigue and unexpected weight change.  HENT: Negative for congestion, sore throat, rhinorrhea, sneezing, trouble swallowing and sinus pressure.   Eyes: Negative for photophobia and visual disturbance.  Respiratory: Negative for cough, chest tightness, shortness of breath, wheezing and stridor.   Cardiovascular: Negative for chest pain, palpitations and leg swelling.  Gastrointestinal: Negative for nausea, vomiting, abdominal pain, diarrhea, constipation, blood in stool, abdominal distention and anal bleeding.  Genitourinary: Negative for dysuria, hematuria, flank pain and difficulty urinating.  Musculoskeletal: Positive for joint swelling, arthralgias and gait problem. Negative for myalgias and back pain.  Skin: Negative for color change, pallor, rash and wound.    Neurological: Negative for dizziness, tremors, weakness and light-headedness.  Hematological: Negative for adenopathy. Does not bruise/bleed easily.  Psychiatric/Behavioral: Negative for behavioral problems, confusion, sleep disturbance, dysphoric mood, decreased concentration and agitation.       Objective:   Physical Exam  Constitutional: She is oriented to person, place, and time. She appears well-developed and well-nourished. No distress.  HENT:  Head: Normocephalic and atraumatic.  Mouth/Throat: Oropharynx is clear and moist. No oropharyngeal exudate.  Eyes: Conjunctivae and EOM are normal. Pupils are equal, round, and reactive to light. No scleral icterus.  Neck: Normal range of motion. Neck supple. No JVD present.  Cardiovascular: Normal rate, regular rhythm and normal heart sounds.  Exam reveals no gallop and no friction rub.   No murmur heard. Pulmonary/Chest: Effort normal and breath sounds normal. No respiratory distress. She has no wheezes. She has no rales. She exhibits no tenderness.  Abdominal: She exhibits no distension and no mass. There is no tenderness. There is no rebound and no guarding.  Musculoskeletal: She exhibits no edema and no tenderness.       Legs: Lymphadenopathy:    She has no cervical adenopathy.  Neurological: She is alert and oriented to person, place, and time. She has normal reflexes. She exhibits normal muscle tone. Coordination normal.  Skin: Skin is warm and dry. She is not diaphoretic. No erythema. No pallor.  Psychiatric: She has a normal mood and affect. Her behavior is normal. Judgment and thought content normal.          Assessment & Plan:  PROSTHETIC JOINT COMPLICATION Infection appears to have recurred based  on 10k in prosthetic joint. If she had cultures sent on this fluid they may have been done on antibiotics. I think she is going to need to have her prosthesis removed in OR where hopefully we can get some cultures deep and OFF  antibiotics to direct 6 weeks of antibiotics followed by reimplantation. I will recheck her esr and crp today in lab she is seeing Dr. Charlann Boxer this Friday  Edema of foot resolved

## 2010-12-23 NOTE — Telephone Encounter (Signed)
New Problem:    Patient needs samples of telmisartan (MICARDIS) 80 MG tablet and ezetimibe (ZETIA) 10 MG tablet. She has run out and is having bad headaches.

## 2010-12-23 NOTE — Assessment & Plan Note (Signed)
Infection appears to have recurred based on 10k in prosthetic joint. If she had cultures sent on this fluid they may have been done on antibiotics. I think she is going to need to have her prosthesis removed in OR where hopefully we can get some cultures deep and OFF antibiotics to direct 6 weeks of antibiotics followed by reimplantation. I will recheck her esr and crp today in lab she is seeing Dr. Charlann Boxer this Friday

## 2010-12-23 NOTE — Telephone Encounter (Signed)
No zetia, micardin 80 mg x4 lot #[id number] a exp 05/13. Advised patient

## 2011-01-01 ENCOUNTER — Other Ambulatory Visit (INDEPENDENT_AMBULATORY_CARE_PROVIDER_SITE_OTHER): Payer: BC Managed Care – PPO | Admitting: *Deleted

## 2011-01-01 ENCOUNTER — Encounter: Payer: Self-pay | Admitting: Cardiology

## 2011-01-01 ENCOUNTER — Ambulatory Visit (INDEPENDENT_AMBULATORY_CARE_PROVIDER_SITE_OTHER): Payer: Worker's Compensation | Admitting: Cardiology

## 2011-01-01 ENCOUNTER — Ambulatory Visit (INDEPENDENT_AMBULATORY_CARE_PROVIDER_SITE_OTHER): Payer: Worker's Compensation | Admitting: *Deleted

## 2011-01-01 VITALS — BP 157/92 | HR 93 | Ht 63.0 in | Wt 337.0 lb

## 2011-01-01 DIAGNOSIS — I10 Essential (primary) hypertension: Secondary | ICD-10-CM

## 2011-01-01 DIAGNOSIS — E119 Type 2 diabetes mellitus without complications: Secondary | ICD-10-CM

## 2011-01-01 DIAGNOSIS — E785 Hyperlipidemia, unspecified: Secondary | ICD-10-CM

## 2011-01-01 DIAGNOSIS — I82409 Acute embolism and thrombosis of unspecified deep veins of unspecified lower extremity: Secondary | ICD-10-CM

## 2011-01-01 DIAGNOSIS — Z79899 Other long term (current) drug therapy: Secondary | ICD-10-CM

## 2011-01-01 DIAGNOSIS — I2699 Other pulmonary embolism without acute cor pulmonale: Secondary | ICD-10-CM

## 2011-01-01 DIAGNOSIS — Z7901 Long term (current) use of anticoagulants: Secondary | ICD-10-CM

## 2011-01-01 DIAGNOSIS — M109 Gout, unspecified: Secondary | ICD-10-CM

## 2011-01-01 DIAGNOSIS — I119 Hypertensive heart disease without heart failure: Secondary | ICD-10-CM

## 2011-01-01 DIAGNOSIS — E7800 Pure hypercholesterolemia, unspecified: Secondary | ICD-10-CM

## 2011-01-01 LAB — HEPATIC FUNCTION PANEL
Albumin: 3.3 g/dL — ABNORMAL LOW (ref 3.5–5.2)
Bilirubin, Direct: 0.1 mg/dL (ref 0.0–0.3)
Total Protein: 7.3 g/dL (ref 6.0–8.3)

## 2011-01-01 LAB — BASIC METABOLIC PANEL
BUN: 14 mg/dL (ref 6–23)
Chloride: 107 mEq/L (ref 96–112)
Potassium: 3.9 mEq/L (ref 3.5–5.1)
Sodium: 143 mEq/L (ref 135–145)

## 2011-01-01 LAB — LIPID PANEL
Cholesterol: 180 mg/dL (ref 0–200)
HDL: 45.3 mg/dL (ref >39.00)
LDL Cholesterol: 121 mg/dL — ABNORMAL HIGH (ref 0–99)
Triglycerides: 71 mg/dL (ref 0.0–149.0)
VLDL: 14.2 mg/dL (ref 0.0–40.0)

## 2011-01-01 NOTE — Progress Notes (Signed)
Mia [name] Date of Birth:  09-Jul-1955 [facility] / [facility] [address]   [address] [place], [place]  [address] [phone]           Fax   [phone]  History of Present Illness: This pleasant 55 year old [place] American woman is seen for a scheduled followup office visit.  She has a past history of essential hypertension, deep vein thrombosis, and hypercholesterolemia.  She is on long-term Coumadin.  She is also followed for a persistent infection of a right knee prosthesis.  Her initial surgery and right knee replacement was in 2010.  She is being followed by Dr. Algis Liming of infectious disease and by Dr. Charlann Boxer for orthopedics.  Current Outpatient Prescriptions  Medication Sig Dispense Refill  . colchicine 0.6 MG tablet Take 0.6 mg by mouth daily.        Marland Kitchen ezetimibe (ZETIA) 10 MG tablet Take 10 mg by mouth daily.        . furosemide (LASIX) 40 MG tablet 40 mg daily.        Marland Kitchen guanFACINE (TENEX) 1 MG tablet Take 1 mg by mouth at bedtime.        Marland Kitchen HYDROcodone-acetaminophen (NORCO) 5-325 MG per tablet Take 1 tablet by mouth every 6 (six) hours as needed.  30 tablet  0  . methocarbamol (ROBAXIN) 500 MG tablet Take 500 mg by mouth 2 (two) times daily.        . polyethylene glycol (MIRALAX) powder Take 17 g by mouth 2 (two) times a week. Use as directed for constipation      . pravastatin (PRAVACHOL) 40 MG tablet Take 40 mg by mouth daily.        Marland Kitchen telmisartan (MICARDIS) 80 MG tablet Take 80 mg by mouth daily.        Marland Kitchen warfarin (COUMADIN) 5 MG tablet Take as directed by Anticoagulation clinic Pt takes up to 1 1/2 tablets daily  45 tablet  11    Allergies  Allergen Reactions  . Amlodipine     Edema,ha  . Crestor (Rosuvastatin Calcium)     Chest pain  . Zosyn     Patient Active Problem List  Diagnoses  . METHICILLIN RESISTANT STAPHYLOCOCCUS AUREUS INFECTION  . KLEBSIELLA PNEUMONIAE INFECTION  . BACTEREMIA  . PROSTHETIC JOINT COMPLICATION  . Essential  hypertension  . DVT (deep venous thrombosis)  . DVT (deep venous thrombosis)  . Hair loss  . Gout  . Leg swelling  . HTN (hypertension)  . Chronic anticoagulation  . Other pulmonary embolism and infarction  . Abscess  . Fall  . Edema of foot  . Infection of prosthetic knee joint    History  Smoking status  . Never Smoker   Smokeless tobacco  . Never Used    History  Alcohol Use No    No family history on file.  Review of Systems: Constitutional: no fever chills diaphoresis or fatigue or change in weight.  Head and neck: no hearing loss, no epistaxis, no photophobia or visual disturbance. Respiratory: No cough, shortness of breath or wheezing. Cardiovascular: No chest pain peripheral edema, palpitations. Gastrointestinal: No abdominal distention, no abdominal pain, no change in bowel habits hematochezia or melena. Genitourinary: No dysuria, no frequency, no urgency, no nocturia. Musculoskeletal:No arthralgias, no back pain, no gait disturbance or myalgias. Neurological: No dizziness, no headaches, no numbness, no seizures, no syncope, no weakness, no tremors. Hematologic: No lymphadenopathy, no easy bruising. Psychiatric: No confusion, no hallucinations, no sleep  disturbance.    Physical Exam: Filed Vitals:   01/01/11 1110  BP: 157/92  Pulse: 93   The general appearance reveals a obese woman in no acute distress.Pupils equal and reactive.   Extraocular Movements are full.  There is no scleral icterus.  The mouth and pharynx are normal.  The neck is supple.  The carotids reveal no bruits.  The jugular venous pressure is normal.  The thyroid is not enlarged.  There is no lymphadenopathy.  The chest is clear to percussion and auscultation. There are no rales or rhonchi. Expansion of the chest is symmetrical.  The precordium is quiet.  The first heart sound is normal.  The second heart sound is physiologically split.  There is no murmur gallop rub or click.  There is no  abnormal lift or heave.  Abdomen is no organomegaly or masses.  Liver and spleen not enlarged or tender.  Extremities reveal mild edema worse on the right than the left.  She has limitation of motion of the right knee.Strength is normal and symmetrical in all extremities.  There is no lateralizing weakness.  There are no sensory deficits.  The skin is warm and dry.  There is no rash.   Assessment / Plan: Continue same medication.  Work harder on weight loss.  She has lost 1 pound this visit.  Recheck in 3 months for followup office visit and EKG

## 2011-01-01 NOTE — Assessment & Plan Note (Signed)
The patient has a prior history of deep vein thrombosis.  She is on long-term Coumadin.  Her last INR was therapeutic.  She's not having any bleeding problems.

## 2011-01-01 NOTE — Assessment & Plan Note (Addendum)
The patient is a past history of dyslipidemia.  She is on pravastatin 40 mg daily.  She is not having any myalgias or other side effects from the statin therapy.  Fasting lab work is pending.  She ran out of Zetia recently and was not able to afford it at the present time.

## 2011-01-01 NOTE — Assessment & Plan Note (Signed)
Patient has a past history of essential hypertension.  A lot of this is secondary to significant morbid obesity.  She presently is on Micardis 80 mg daily and we have tried to keep her in samples.  She's not having any chest pain or angina.  She's had no dizzy spells or syncope.  Is not aware of any palpitations.

## 2011-01-01 NOTE — Patient Instructions (Addendum)
Your physician recommends that you continue on your current medications as directed. Please refer to the Current Medication list given to you today. Your physician recommends that you schedule a follow-up appointment in: 3 months with EKG   

## 2011-01-01 NOTE — Assessment & Plan Note (Signed)
The patient has not had any flareup of gout.

## 2011-01-15 ENCOUNTER — Other Ambulatory Visit: Payer: Self-pay | Admitting: Cardiology

## 2011-01-26 ENCOUNTER — Telehealth: Payer: Self-pay | Admitting: Cardiology

## 2011-01-26 NOTE — Telephone Encounter (Signed)
New Problem:    PAtient called in wondering if we have any samples of telmisartan (MICARDIS) 80 MG tablet and ezetimibe (ZETIA) 10 MG tablet available. Please call back.

## 2011-01-26 NOTE — Telephone Encounter (Signed)
Advised patient that we don't have any samples of the micardis and zetia

## 2011-01-27 ENCOUNTER — Ambulatory Visit (INDEPENDENT_AMBULATORY_CARE_PROVIDER_SITE_OTHER): Payer: BC Managed Care – PPO | Admitting: Infectious Disease

## 2011-01-27 ENCOUNTER — Encounter: Payer: Self-pay | Admitting: Infectious Disease

## 2011-01-27 VITALS — BP 150/93 | HR 90 | Temp 98.1°F | Ht 63.0 in | Wt 337.5 lb

## 2011-01-27 DIAGNOSIS — T8459XA Infection and inflammatory reaction due to other internal joint prosthesis, initial encounter: Secondary | ICD-10-CM

## 2011-01-27 DIAGNOSIS — T8489XA Other specified complication of internal orthopedic prosthetic devices, implants and grafts, initial encounter: Secondary | ICD-10-CM

## 2011-01-27 DIAGNOSIS — Z96659 Presence of unspecified artificial knee joint: Secondary | ICD-10-CM

## 2011-01-27 DIAGNOSIS — T8450XA Infection and inflammatory reaction due to unspecified internal joint prosthesis, initial encounter: Secondary | ICD-10-CM

## 2011-01-27 DIAGNOSIS — A4902 Methicillin resistant Staphylococcus aureus infection, unspecified site: Secondary | ICD-10-CM

## 2011-01-27 DIAGNOSIS — M109 Gout, unspecified: Secondary | ICD-10-CM

## 2011-01-27 NOTE — Assessment & Plan Note (Signed)
Not active 

## 2011-01-27 NOTE — Assessment & Plan Note (Signed)
Recheck inflammatory markers. AS SOON AS SHE HAS SYMPTOMS OF WORSENING KNEE PAIN, FEVER OR suggestion of recurrence of infection , she should have aspiration sterile by Dr. Charlann Boxer OFF antibiotics to discern if infection has recurred. She will then optimally need 2 staged exhange arthroplastyw with joint washout

## 2011-01-27 NOTE — Progress Notes (Signed)
Subjective:    Patient ID: Mia [name], female    DOB: [birth year], 56 y.o.   MRN: [id number]  HPI  56 year old with recurrent prosthetic knee infection with MRSA sp repeat I and D in January 2012 with exchange of polyethylene and washout by Dr Ranell Patrick. She was being treated with daptomycin due to concern about a rxn to vancomycin. During her stay she developed allergic rash on zosyn and cefepime. She was on the daptomycin and developed a PICC line>N infection with e coli and klebsiella and was treated with cipro. She had been off of antibiotics for most of this year until she had trauma with swollen foot that I performed I and D on (was largely a hematoma). She has been seen by Dr. Lestine Box and  Dr. Charlann Boxer for concerns of recurrence of infection in the right knee where she has loosening. Her ESR has come up above 25 now. Per the pt aspirate of her right knee showed 10K wbc in Dr. Boneta Lucks office. When I saw her last we took her off antibiotics in anticipation of repeat aspiration of her knee by Dr. Charlann Boxer. Since then she actually improved with less pain and no fevers ro chllls or systemic symptoms. She is currently being observed off antibiotics. She is more engaged with PT, trying to lose weight and exercise.  Review of Systems  Constitutional: Negative for fever, chills, diaphoresis, activity change, appetite change, fatigue and unexpected weight change.  HENT: Negative for congestion, sore throat, rhinorrhea, sneezing, trouble swallowing and sinus pressure.   Eyes: Negative for photophobia and visual disturbance.  Respiratory: Negative for cough, chest tightness, shortness of breath, wheezing and stridor.   Cardiovascular: Negative for chest pain, palpitations and leg swelling.  Gastrointestinal: Negative for nausea, vomiting, abdominal pain, diarrhea, constipation, blood in stool, abdominal distention and anal bleeding.  Genitourinary: Negative for dysuria, hematuria, flank pain and difficulty  urinating.  Musculoskeletal: Negative for myalgias, back pain, joint swelling, arthralgias and gait problem.  Skin: Negative for color change, pallor, rash and wound.  Neurological: Negative for dizziness, tremors, weakness and light-headedness.  Hematological: Negative for adenopathy. Does not bruise/bleed easily.  Psychiatric/Behavioral: Negative for behavioral problems, confusion, sleep disturbance, dysphoric mood, decreased concentration and agitation.       Objective:   Physical Exam  Constitutional: She is oriented to person, place, and time. She appears well-developed and well-nourished. No distress.  HENT:  Head: Normocephalic and atraumatic.  Mouth/Throat: Oropharynx is clear and moist. No oropharyngeal exudate.  Eyes: Conjunctivae and EOM are normal. Pupils are equal, round, and reactive to light. No scleral icterus.  Neck: Normal range of motion. Neck supple. No JVD present.  Cardiovascular: Normal rate, regular rhythm and normal heart sounds.  Exam reveals no gallop and no friction rub.   No murmur heard. Pulmonary/Chest: Effort normal and breath sounds normal. No respiratory distress. She has no wheezes. She has no rales. She exhibits no tenderness.  Abdominal: She exhibits no distension and no mass. There is no tenderness. There is no rebound and no guarding.  Musculoskeletal: She exhibits no edema and no tenderness.       Legs: Lymphadenopathy:    She has no cervical adenopathy.  Neurological: She is alert and oriented to person, place, and time. She has normal reflexes. She exhibits normal muscle tone. Coordination normal.  Skin: Skin is warm and dry. She is not diaphoretic. No erythema. No pallor.  Psychiatric: She has a normal mood and affect. Her behavior is normal. Judgment  and thought content normal.          Assessment & Plan:

## 2011-01-27 NOTE — Assessment & Plan Note (Signed)
See above

## 2011-01-29 ENCOUNTER — Ambulatory Visit (INDEPENDENT_AMBULATORY_CARE_PROVIDER_SITE_OTHER): Payer: Worker's Compensation | Admitting: *Deleted

## 2011-01-29 ENCOUNTER — Telehealth: Payer: Self-pay | Admitting: *Deleted

## 2011-01-29 DIAGNOSIS — I119 Hypertensive heart disease without heart failure: Secondary | ICD-10-CM

## 2011-01-29 DIAGNOSIS — I82409 Acute embolism and thrombosis of unspecified deep veins of unspecified lower extremity: Secondary | ICD-10-CM

## 2011-01-29 DIAGNOSIS — I2699 Other pulmonary embolism without acute cor pulmonale: Secondary | ICD-10-CM

## 2011-01-29 DIAGNOSIS — Z7901 Long term (current) use of anticoagulants: Secondary | ICD-10-CM

## 2011-01-29 MED ORDER — LOSARTAN POTASSIUM 100 MG PO TABS: 100.0000 mg | ORAL_TABLET | Freq: Every day | ORAL | Status: DC

## 2011-01-29 NOTE — Telephone Encounter (Signed)
Patient came in for samples of Micardis, we didn't have any.  Stated she has not had for several days secondary to cost.  Advised  Dr. Patty Sermons working elsewhere today but would try to send him a message.  She wants to try to change to something less expensive (generic if possible).

## 2011-01-29 NOTE — Telephone Encounter (Signed)
Advised patient to start the Losartan, Diovan not available in generic

## 2011-01-29 NOTE — Telephone Encounter (Signed)
Switch to generic Diovan 320 mg daily if it is available.  If not available, then Losartan 100 mg daily.

## 2011-03-05 ENCOUNTER — Ambulatory Visit (INDEPENDENT_AMBULATORY_CARE_PROVIDER_SITE_OTHER): Payer: BC Managed Care – PPO | Admitting: *Deleted

## 2011-03-05 DIAGNOSIS — I82409 Acute embolism and thrombosis of unspecified deep veins of unspecified lower extremity: Secondary | ICD-10-CM

## 2011-03-05 DIAGNOSIS — Z7901 Long term (current) use of anticoagulants: Secondary | ICD-10-CM

## 2011-03-05 DIAGNOSIS — I2699 Other pulmonary embolism without acute cor pulmonale: Secondary | ICD-10-CM

## 2011-03-05 LAB — PROTIME-INR
INR: 6.7 ratio (ref 0.8–1.0)
Prothrombin Time: 75 s (ref 10.2–12.4)

## 2011-03-12 ENCOUNTER — Ambulatory Visit (INDEPENDENT_AMBULATORY_CARE_PROVIDER_SITE_OTHER): Payer: BC Managed Care – PPO | Admitting: *Deleted

## 2011-03-12 DIAGNOSIS — Z7901 Long term (current) use of anticoagulants: Secondary | ICD-10-CM

## 2011-03-12 DIAGNOSIS — I82409 Acute embolism and thrombosis of unspecified deep veins of unspecified lower extremity: Secondary | ICD-10-CM

## 2011-03-12 DIAGNOSIS — I2699 Other pulmonary embolism without acute cor pulmonale: Secondary | ICD-10-CM

## 2011-03-19 ENCOUNTER — Ambulatory Visit (INDEPENDENT_AMBULATORY_CARE_PROVIDER_SITE_OTHER): Payer: BC Managed Care – PPO | Admitting: *Deleted

## 2011-03-19 DIAGNOSIS — I2699 Other pulmonary embolism without acute cor pulmonale: Secondary | ICD-10-CM

## 2011-03-19 DIAGNOSIS — Z7901 Long term (current) use of anticoagulants: Secondary | ICD-10-CM

## 2011-03-19 DIAGNOSIS — I82409 Acute embolism and thrombosis of unspecified deep veins of unspecified lower extremity: Secondary | ICD-10-CM

## 2011-03-19 LAB — POCT INR: INR: 2.8

## 2011-03-29 ENCOUNTER — Encounter: Payer: Self-pay | Admitting: Infectious Disease

## 2011-03-29 ENCOUNTER — Ambulatory Visit (INDEPENDENT_AMBULATORY_CARE_PROVIDER_SITE_OTHER): Payer: BC Managed Care – PPO | Admitting: Infectious Disease

## 2011-03-29 VITALS — BP 160/106 | HR 85 | Temp 98.5°F | Wt 342.5 lb

## 2011-03-29 DIAGNOSIS — T8450XA Infection and inflammatory reaction due to unspecified internal joint prosthesis, initial encounter: Secondary | ICD-10-CM

## 2011-03-29 DIAGNOSIS — Z96659 Presence of unspecified artificial knee joint: Secondary | ICD-10-CM

## 2011-03-29 DIAGNOSIS — I10 Essential (primary) hypertension: Secondary | ICD-10-CM

## 2011-03-29 NOTE — Progress Notes (Signed)
Patient ID: Mia [name], female   DOB: 1955-08-27, 56 y.o.   MRN: [id number]

## 2011-03-29 NOTE — Assessment & Plan Note (Signed)
BP high in clinic on initial read but pt asymptomatic

## 2011-03-29 NOTE — Assessment & Plan Note (Signed)
Continue to observe off antibiotics. Recheck esr, crp and basic labs

## 2011-03-29 NOTE — Progress Notes (Signed)
Subjective:    Patient ID: Mia [name], female    DOB: 1955/05/04, 56 y.o.   MRN: [id number]  HPI  56 year old with recurrent prosthetic knee infection with MRSA sp repeat I and D in January 2012 with exchange of polyethylene and washout by Dr Ranell Patrick. She was being treated with daptomycin due to concern about a rxn to vancomycin. During her stay she developed allergic rash on zosyn and cefepime. She was on the daptomycin and developed a PICC line>N infection with e coli and klebsiella and was treated with cipro. She had been off of antibiotics for most of this year until she had trauma with swollen foot that I performed I and D on (was largely a hematoma). She has been seen by Dr. Lestine Box and Dr. Charlann Boxer for concerns of recurrence of infection in the right knee where she has loosening. Her ESR has come up into the 20s. Per the pt aspirate of her right knee showed 10K wbc in Dr. Boneta Lucks office. I then took her off   antibiotics in anticipation of repeat aspiration of her knee by Dr. Charlann Boxer. Since then rhe pt actually improved with less pain and no fevers ro chllls or systemic symptoms. We continued to observe her off of antibiotics since then and she presents for followup today. She continues to have pain typically 5/10 with exertion weight bearing and changes in the weather. On exam knee is NOT warm but is tender to palpation.  Review of Systems  Constitutional: Negative for fever, chills, diaphoresis, activity change, appetite change, fatigue and unexpected weight change.  HENT: Negative for congestion, sore throat, rhinorrhea, sneezing, trouble swallowing and sinus pressure.   Eyes: Negative for photophobia and visual disturbance.  Respiratory: Negative for cough, chest tightness, shortness of breath, wheezing and stridor.   Cardiovascular: Negative for chest pain, palpitations and leg swelling.  Gastrointestinal: Negative for nausea, vomiting, abdominal pain, diarrhea, constipation, blood in stool,  abdominal distention and anal bleeding.  Genitourinary: Negative for dysuria, hematuria, flank pain and difficulty urinating.  Musculoskeletal: Positive for joint swelling and arthralgias. Negative for myalgias, back pain and gait problem.  Skin: Negative for color change, pallor, rash and wound.  Neurological: Negative for dizziness, tremors, weakness and light-headedness.  Hematological: Negative for adenopathy. Does not bruise/bleed easily.  Psychiatric/Behavioral: Negative for behavioral problems, confusion, sleep disturbance, dysphoric mood, decreased concentration and agitation.       Objective:   Physical Exam  Constitutional: She is oriented to person, place, and time. She appears well-developed and well-nourished. No distress.  HENT:  Head: Normocephalic and atraumatic.  Mouth/Throat: Oropharynx is clear and moist. No oropharyngeal exudate.  Eyes: Conjunctivae and EOM are normal. Pupils are equal, round, and reactive to light. No scleral icterus.  Neck: Normal range of motion. Neck supple. No JVD present.  Cardiovascular: Normal rate, regular rhythm and normal heart sounds.  Exam reveals no gallop and no friction rub.   No murmur heard. Pulmonary/Chest: Effort normal and breath sounds normal. No respiratory distress. She has no wheezes. She has no rales. She exhibits no tenderness.  Abdominal: She exhibits no distension and no mass. There is no tenderness. There is no rebound and no guarding.  Musculoskeletal: She exhibits no edema and no tenderness.       Legs: Lymphadenopathy:    She has no cervical adenopathy.  Neurological: She is alert and oriented to person, place, and time. She has normal reflexes. She exhibits normal muscle tone. Coordination normal.  Skin: Skin is  warm and dry. She is not diaphoretic. No erythema. No pallor.  Psychiatric: She has a normal mood and affect. Her behavior is normal. Judgment and thought content normal.          Assessment & Plan:    Infection of prosthetic knee joint Continue to observe off antibiotics. Recheck esr, crp and basic labs  HTN (hypertension) BP high in clinic on initial read but pt asymptomatic

## 2011-03-30 LAB — SEDIMENTATION RATE: Sed Rate: 8 mm/hr (ref 0–22)

## 2011-03-30 LAB — CBC WITH DIFFERENTIAL/PLATELET
Basophils Absolute: 0 10*3/uL (ref 0.0–0.1)
Basophils Relative: 0 % (ref 0–1)
MCHC: 32.1 g/dL (ref 30.0–36.0)
Neutro Abs: 5.2 10*3/uL (ref 1.7–7.7)
Neutrophils Relative %: 62 % (ref 43–77)
Platelets: 294 10*3/uL (ref 150–400)
RDW: 17.9 % — ABNORMAL HIGH (ref 11.5–15.5)
WBC: 8.4 10*3/uL (ref 4.0–10.5)

## 2011-03-30 LAB — BASIC METABOLIC PANEL WITH GFR
BUN: 10 mg/dL (ref 6–23)
Creat: 0.63 mg/dL (ref 0.50–1.10)
GFR, Est African American: >89 mL/min
GFR, Est Non African American: >89 mL/min

## 2011-03-30 LAB — C-REACTIVE PROTEIN: CRP: 0.97 mg/dL — ABNORMAL HIGH (ref <0.60)

## 2011-04-01 ENCOUNTER — Encounter: Payer: Self-pay | Admitting: Cardiology

## 2011-04-01 ENCOUNTER — Ambulatory Visit (INDEPENDENT_AMBULATORY_CARE_PROVIDER_SITE_OTHER): Payer: Worker's Compensation

## 2011-04-01 ENCOUNTER — Ambulatory Visit (INDEPENDENT_AMBULATORY_CARE_PROVIDER_SITE_OTHER): Payer: Worker's Compensation | Admitting: Cardiology

## 2011-04-01 VITALS — BP 128/94 | HR 80 | Ht 63.0 in | Wt 339.0 lb

## 2011-04-01 DIAGNOSIS — K5900 Constipation, unspecified: Secondary | ICD-10-CM

## 2011-04-01 DIAGNOSIS — I119 Hypertensive heart disease without heart failure: Secondary | ICD-10-CM

## 2011-04-01 DIAGNOSIS — Z7901 Long term (current) use of anticoagulants: Secondary | ICD-10-CM

## 2011-04-01 DIAGNOSIS — I10 Essential (primary) hypertension: Secondary | ICD-10-CM

## 2011-04-01 DIAGNOSIS — R52 Pain, unspecified: Secondary | ICD-10-CM

## 2011-04-01 DIAGNOSIS — I82409 Acute embolism and thrombosis of unspecified deep veins of unspecified lower extremity: Secondary | ICD-10-CM

## 2011-04-01 DIAGNOSIS — I456 Pre-excitation syndrome: Secondary | ICD-10-CM

## 2011-04-01 DIAGNOSIS — I2699 Other pulmonary embolism without acute cor pulmonale: Secondary | ICD-10-CM

## 2011-04-01 DIAGNOSIS — E785 Hyperlipidemia, unspecified: Secondary | ICD-10-CM

## 2011-04-01 DIAGNOSIS — I4891 Unspecified atrial fibrillation: Secondary | ICD-10-CM

## 2011-04-01 DIAGNOSIS — E7800 Pure hypercholesterolemia, unspecified: Secondary | ICD-10-CM

## 2011-04-01 DIAGNOSIS — A4902 Methicillin resistant Staphylococcus aureus infection, unspecified site: Secondary | ICD-10-CM

## 2011-04-01 MED ORDER — WARFARIN SODIUM 5 MG PO TABS: ORAL_TABLET | ORAL | Status: DC

## 2011-04-01 MED ORDER — HYDROCODONE-ACETAMINOPHEN 5-325 MG PO TABS: 1.0000 | ORAL_TABLET | Freq: Four times a day (QID) | ORAL | Status: DC | PRN

## 2011-04-01 NOTE — Assessment & Plan Note (Signed)
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  She is followed in the Coumadin clinic.  Today her INR was supratherapeutic

## 2011-04-01 NOTE — Assessment & Plan Note (Signed)
The patient has essential hypertension.  Blood pressure is still elevated today.  Her weight is elevated which she attributes to her chronic constipation.  The patient has not been having any symptoms of dizziness or syncope.  She has not had any symptoms of congestive heart failure.

## 2011-04-01 NOTE — Assessment & Plan Note (Signed)
The patient has a history of dyslipidemia.  She is on pravastatin and ezetimibe.  She's not having any myalgias from the pravastatin

## 2011-04-01 NOTE — Patient Instructions (Addendum)
Will have you get protime today  Your physician recommends that you continue on your current medications as directed. Please refer to the Current Medication list given to you today.  Your physician recommends that you schedule a follow-up appointment in: 3 months ov/ekg

## 2011-04-01 NOTE — Progress Notes (Signed)
[name] Date of Birth:  08/17/1955 [facility] [address] [place], [place]  [address] [phone]         Fax   [phone]  History of Present Illness: This pleasant 56 year old [place] American woman is seen for a scheduled 3 month followup office visit.  He has a complex past medical history.  He has a past history of deep vein thrombosis and is on long-term Coumadin.  She's also had a history of exogenous obesity, hypercholesterolemia, and essential hypertension.  She is under the care of Dr. Algis Liming and Dr.Olin for a persistent inflammation and infection of a right knee prosthesis.  Her initial surgery and right knee replacement was in 2010.  She is still disabled from work.  She saw Dr. Algis Liming last week and blood work was obtained which shows normal white count and persistent slight elevation of C. reactive protein patient is not presently on any antibiotics.  The patient still notices moderate heat and inflammation in the right knee and pain when she walks.  She is on Vicodin.  Current Outpatient Prescriptions  Medication Sig Dispense Refill  . colchicine 0.6 MG tablet Take 0.6 mg by mouth daily. As needed      . ezetimibe (ZETIA) 10 MG tablet Take 10 mg by mouth daily.        . furosemide (LASIX) 40 MG tablet 40 mg daily.        Marland Kitchen guanFACINE (TENEX) 1 MG tablet Take 1 mg by mouth at bedtime.        Marland Kitchen losartan (COZAAR) 100 MG tablet Take 1 tablet (100 mg total) by mouth daily.  30 tablet  11  . methocarbamol (ROBAXIN) 500 MG tablet Take 500 mg by mouth 2 (two) times daily.        . polyethylene glycol (MIRALAX) powder Take 17 g by mouth 2 (two) times a week. Use as directed for constipation      . pravastatin (PRAVACHOL) 40 MG tablet TAKE 1 TABLET BY MOUTH EVERY NIGHT AT BEDTIME  30 tablet  6  . warfarin (COUMADIN) 5 MG tablet Take as directed by Anticoagulation clinic Pt takes up to 1 1/2 tablets daily  45 tablet  11  . DISCONTD:  HYDROcodone-acetaminophen (NORCO) 5-325 MG per tablet Take 1 tablet by mouth every 6 (six) hours as needed.  30 tablet  0    Allergies  Allergen Reactions  . Amlodipine     Edema,ha  . Crestor (Rosuvastatin Calcium)     Chest pain  . Zosyn     Patient Active Problem List  Diagnoses  . METHICILLIN RESISTANT STAPHYLOCOCCUS AUREUS INFECTION  . KLEBSIELLA PNEUMONIAE INFECTION  . BACTEREMIA  . PROSTHETIC JOINT COMPLICATION  . Essential hypertension  . DVT (deep venous thrombosis)  . DVT (deep venous thrombosis)  . Hair loss  . Gout  . Leg swelling  . HTN (hypertension)  . Chronic anticoagulation  . Other pulmonary embolism and infarction  . Abscess  . Fall  . Edema of foot  . Infection of prosthetic knee joint  . Dyslipidemia  . Wolff-Parkinson-White (WPW) syndrome    History  Smoking status  . Never Smoker   Smokeless tobacco  . Never Used    History  Alcohol Use No    No family history on file.  Review of Systems: Constitutional: no fever chills diaphoresis or fatigue or change in weight.  Head and neck: no hearing loss, no epistaxis, no photophobia or visual disturbance. Respiratory:  No cough, shortness of breath or wheezing. Cardiovascular: No chest pain peripheral edema, palpitations. Gastrointestinal: No abdominal distention, no abdominal pain, no change in bowel habits hematochezia or melena. Genitourinary: No dysuria, no frequency, no urgency, no nocturia. Musculoskeletal:No arthralgias, no back pain, no gait disturbance or myalgias. Neurological: No dizziness, no headaches, no numbness, no seizures, no syncope, no weakness, no tremors. Hematologic: No lymphadenopathy, no easy bruising. Psychiatric: No confusion, no hallucinations, no sleep disturbance.    Physical Exam: Filed Vitals:   04/01/11 1036  BP: 128/94  Pulse: 80   the general appearance reveals a well-developed well-nourished large African American woman in no distress.Pupils equal and  reactive.   Extraocular Movements are full.  There is no scleral icterus.  The mouth and pharynx are normal.  The neck is supple.  The carotids reveal no bruits.  The jugular venous pressure is normal.  The thyroid is not enlarged.  There is no lymphadenopathy.  The chest is clear to percussion and auscultation. There are no rales or rhonchi. Expansion of the chest is symmetrical.  The precordium is quiet.  The first heart sound is normal.  The second heart sound is physiologically split.  There is no murmur gallop rub or click.  There is no abnormal lift or heave.  Abdomen is obese and nontender.  Bowel sounds are present.  Extremities reveal slight warmth to palpation over the right knee joint.  No significant pedal edema.  EKG shows normal sinus rhythm and pattern of WPW   Assessment / Plan: The patient is to continue same medication.  We called a new prescriptions for Vicodin and warfarin for her today.  She will be seen in 3 months and we will repeat her EKG in to see if her WPW is transient or persistent.  The patient remains disabled from work because of her right knee chronic infection situation.

## 2011-04-01 NOTE — Assessment & Plan Note (Signed)
Routine EKG today shows a pattern of WPW.  This has not been seen on her previous EKGs.  She has had no symptoms of paroxysmal supraventricular tachycardia or awareness of any arrhythmia

## 2011-04-15 ENCOUNTER — Ambulatory Visit (INDEPENDENT_AMBULATORY_CARE_PROVIDER_SITE_OTHER): Payer: Worker's Compensation | Admitting: *Deleted

## 2011-04-15 DIAGNOSIS — I82409 Acute embolism and thrombosis of unspecified deep veins of unspecified lower extremity: Secondary | ICD-10-CM

## 2011-04-15 DIAGNOSIS — Z7901 Long term (current) use of anticoagulants: Secondary | ICD-10-CM

## 2011-04-15 DIAGNOSIS — I2699 Other pulmonary embolism without acute cor pulmonale: Secondary | ICD-10-CM

## 2011-04-15 LAB — POCT INR: INR: 3

## 2011-04-22 ENCOUNTER — Other Ambulatory Visit: Payer: Self-pay | Admitting: Nurse Practitioner

## 2011-04-23 ENCOUNTER — Encounter: Payer: Self-pay | Admitting: Internal Medicine

## 2011-04-23 DIAGNOSIS — K5790 Diverticulosis of intestine, part unspecified, without perforation or abscess without bleeding: Secondary | ICD-10-CM | POA: Insufficient documentation

## 2011-04-23 DIAGNOSIS — D649 Anemia, unspecified: Secondary | ICD-10-CM

## 2011-04-23 DIAGNOSIS — K589 Irritable bowel syndrome without diarrhea: Secondary | ICD-10-CM | POA: Insufficient documentation

## 2011-04-23 DIAGNOSIS — K635 Polyp of colon: Secondary | ICD-10-CM | POA: Insufficient documentation

## 2011-04-23 DIAGNOSIS — Z0001 Encounter for general adult medical examination with abnormal findings: Secondary | ICD-10-CM | POA: Insufficient documentation

## 2011-04-23 HISTORY — DX: Anemia, unspecified: D64.9

## 2011-04-23 NOTE — Telephone Encounter (Signed)
Refilled tenex

## 2011-04-29 ENCOUNTER — Ambulatory Visit (INDEPENDENT_AMBULATORY_CARE_PROVIDER_SITE_OTHER): Payer: BC Managed Care – PPO | Admitting: Internal Medicine

## 2011-04-29 ENCOUNTER — Encounter: Payer: Self-pay | Admitting: Internal Medicine

## 2011-04-29 VITALS — BP 140/90 | HR 79 | Temp 98.2°F | Ht 64.0 in | Wt 345.0 lb

## 2011-04-29 DIAGNOSIS — Z0000 Encounter for general adult medical examination without abnormal findings: Secondary | ICD-10-CM

## 2011-04-29 DIAGNOSIS — J309 Allergic rhinitis, unspecified: Secondary | ICD-10-CM

## 2011-04-29 DIAGNOSIS — K5900 Constipation, unspecified: Secondary | ICD-10-CM | POA: Insufficient documentation

## 2011-04-29 DIAGNOSIS — G43909 Migraine, unspecified, not intractable, without status migrainosus: Secondary | ICD-10-CM | POA: Insufficient documentation

## 2011-04-29 HISTORY — DX: Allergic rhinitis, unspecified: J30.9

## 2011-04-29 MED ORDER — LACTULOSE 10 GM/15ML PO SOLN: 20.0000 g | Freq: Three times a day (TID) | ORAL | Status: AC | PRN

## 2011-04-29 MED ORDER — PHENTERMINE HCL 37.5 MG PO CAPS: 37.5000 mg | ORAL_CAPSULE | ORAL | Status: DC

## 2011-04-29 NOTE — Assessment & Plan Note (Addendum)
Overall doing well, age appropriate education and counseling updated, referrals for preventative services and immunizations addressed, dietary and smoking counseling addressed, most recent labs and ECG reviewed.  I have personally reviewed and have noted: 1) the patient's medical and social history 2) The pt's use of alcohol, tobacco, and illicit drugs 3) The patient's current medications and supplements 4) Functional ability including ADL's, fall risk, home safety risk, hearing and visual impairment 5) Diet and physical activities 6) Evidence for depression or mood disorder 7) The patient's height, weight, and BMI have been recorded in the chart I have made referrals, and provided counseling and education based on review of the above For labs today, delcines tetanus

## 2011-04-29 NOTE — Assessment & Plan Note (Signed)
Ok for phentermine short course to help reduce wt, for lower calories, increased activity as able,  to f/u any worsening symptoms or concerns

## 2011-04-29 NOTE — Progress Notes (Signed)
Subjective:    Patient ID: Mia [name], female    DOB: [birth year], 56 y.o.   MRN: [id number]  HPI  Here for wellness and to sestablish;  Overall doing ok;  Pt denies CP, worsening SOB, DOE, wheezing, orthopnea, PND, worsening LE edema, palpitations, dizziness or syncope.  Pt denies neurological change such as new Headache, facial or extremity weakness.  Pt denies polydipsia, polyuria, or low sugar symptoms. Pt states overall good compliance with treatment and medications, good tolerability, and trying to follow lower cholesterol diet.  Pt denies worsening depressive symptoms, suicidal ideation or panic. No fever, wt loss, night sweats, loss of appetite, or other constitutional symptoms.  Pt states good ability with ADL's, mod to high fall risk, home safety reviewed and adequate, no significant changes in hearing or vision, and occasionally active with exercise.  Also with chronic constipation even with miralax.  Needs right knee surgury but she is tyring to hold off for now given her complicated hx , wants to lose wt prior to next knee surgury, and not candidate for gastric bypass due to hx of blood clots.  No hx of DM.  Gets only 3 -4 hr sleep per night due to pain.  Tends to panic with mask use so not able to eval'd or tx for OSA.   Has gained 50 lbs per pt due to inactivity, since 2010, no TSH done on curren EMR record for several yrs. Past Medical History  Diagnosis Date  . Hypertension   . Morbid obesity   . Nonsmoker   . Pulmonary embolism 2010    following right knee replacement  . DVT (deep venous thrombosis)     right upper extremitiy  . Chronic anticoagulation   . Infected prosthetic knee joint   . H/O: GI bleed   . Anemia   . IBS (irritable bowel syndrome)   . Hyperlipidemia   . WPW (Wolff-Parkinson-White syndrome)   . Anemia, unspecified 04/23/2011  . Diverticulosis   . Colon polyps may 2011  . Allergic rhinitis, cause unspecified 04/29/2011  . Migraine 04/29/2011   Past  Surgical History  Procedure Date  . Right knee replacement 2010    followed by [facility] & D for infection  . Cholecystectomy   . Tubal ligation     reports that she has never smoked. She has never used smokeless tobacco. She reports that she does not drink alcohol or use illicit drugs. family history includes Colon cancer in an unspecified family member and Coronary artery disease in an unspecified family member. Allergies  Allergen Reactions  . Amlodipine     Edema,ha  . Crestor (Rosuvastatin Calcium)     Chest pain  . Zosyn    Current Outpatient Prescriptions on File Prior to Visit  Medication Sig Dispense Refill  . colchicine 0.6 MG tablet Take 0.6 mg by mouth daily. As needed      . ezetimibe (ZETIA) 10 MG tablet Take 10 mg by mouth daily.        . furosemide (LASIX) 40 MG tablet 40 mg daily.        Marland Kitchen guanFACINE (TENEX) 1 MG tablet Take 1 mg by mouth at bedtime.        Marland Kitchen guanFACINE (TENEX) 1 MG tablet TAKE ONE TABLET BY MOUTH DAILY AT BEDTIME  30 tablet  5  . losartan (COZAAR) 100 MG tablet Take 1 tablet (100 mg total) by mouth daily.  30 tablet  11  . methocarbamol (ROBAXIN) 500 MG tablet Take  500 mg by mouth 2 (two) times daily.        . polyethylene glycol (MIRALAX) powder Take 17 g by mouth 2 (two) times a week. Use as directed for constipation      . pravastatin (PRAVACHOL) 40 MG tablet TAKE 1 TABLET BY MOUTH EVERY NIGHT AT BEDTIME  30 tablet  6  . warfarin (COUMADIN) 5 MG tablet Take as directed by Anticoagulation clinic Pt takes up to 1 1/2 tablets daily  50 tablet  11  . HYDROcodone-acetaminophen (NORCO) 5-325 MG per tablet Take 1 tablet by mouth every 6 (six) hours as needed for pain.  60 tablet  1   Review of Systems Review of Systems  Constitutional: Negative for diaphoresis, activity change, appetite change and unexpected weight change.  HENT: Negative for hearing loss, ear pain, facial swelling, mouth sores and neck stiffness.   Eyes: Negative for pain, redness and  visual disturbance.  Respiratory: Negative for shortness of breath and wheezing.   Cardiovascular: Negative for chest pain and palpitations.  Gastrointestinal: Negative for diarrhea, blood in stool, abdominal distention and rectal pain.  Genitourinary: Negative for hematuria, flank pain and decreased urine volume.  Musculoskeletal: Negative for myalgias and joint swelling.  Skin: Negative for color change and wound.  Neurological: Negative for syncope and numbness.  Hematological: Negative for adenopathy.  Psychiatric/Behavioral: Negative for hallucinations, self-injury, decreased concentration and agitation.      Objective:   Physical Exam BP 140/90  Pulse 79  Temp(Src) 98.2 F (36.8 C) (Oral)  Ht 5\' 4"  (1.626 m)  Wt 345 lb (156.491 kg)  BMI 59.22 kg/m2  SpO2 90% Physical Exam  VS noted, not ill appearing Constitutional: Pt is oriented to person, place, and time. Appears well-developed and well-nourished. /morbid obese Head: Normocephalic and atraumatic.  Right Ear: External ear normal.  Left Ear: External ear normal.  Nose: Nose normal.  Mouth/Throat: Oropharynx is clear and moist.  Eyes: Conjunctivae and EOM are normal. Pupils are equal, round, and reactive to light.  Neck: Normal range of motion. Neck supple. No JVD present. No tracheal deviation present.  Cardiovascular: Normal rate, regular rhythm, normal heart sounds and intact distal pulses.   Pulmonary/Chest: Effort normal and breath sounds normal.  Abdominal: Soft. Bowel sounds are normal. There is no tenderness.  Musculoskeletal: Normal range of motion. Except for right knee with midline scar, NT, no effusion but decreased ROM Lymphadenopathy:  Has no cervical adenopathy.  Neurological: Pt is alert and oriented to person, place, and time. Pt has normal reflexes. No cranial nerve deficit. Motor intact Skin: Skin is warm and dry. No rash noted.  Psychiatric:  Has  normal mood and affect. Behavior is normal.       Assessment & Plan:

## 2011-04-29 NOTE — Assessment & Plan Note (Signed)
Ongoing mild to mod, likely assoc with less active, wt gain, and narcotic use;  For colace bid, prn, senakot prn, d/c miralax and also lactulose prn, to avoid stimulants due to hx of syncope in the past related to this

## 2011-04-29 NOTE — Patient Instructions (Addendum)
Take all new medications as prescribed - the lactulose and the phentermine You can also take colace 100 mg twice per day as needed for stool softner, and senakot 1 at night as needed for constipation as well Continue all other medications as before Please go to LAB in the Basement for the blood and/or urine tests to be done today You will be contacted by phone if any changes need to be made immediately.  Otherwise, you will receive a letter about your results with an explanation. Please remember to followup with your GYN for the yearly pap smear and/or mammogram as you do Please return in 6 months, or sooner if needed

## 2011-04-30 ENCOUNTER — Other Ambulatory Visit (INDEPENDENT_AMBULATORY_CARE_PROVIDER_SITE_OTHER): Payer: BC Managed Care – PPO

## 2011-04-30 ENCOUNTER — Encounter: Payer: Self-pay | Admitting: Internal Medicine

## 2011-04-30 DIAGNOSIS — Z0000 Encounter for general adult medical examination without abnormal findings: Secondary | ICD-10-CM

## 2011-04-30 LAB — URINALYSIS, ROUTINE W REFLEX MICROSCOPIC
Bilirubin Urine: NEGATIVE
Leukocytes, UA: NEGATIVE
Nitrite: NEGATIVE
Total Protein, Urine: NEGATIVE
Urobilinogen, UA: 0.2 (ref 0.0–1.0)

## 2011-04-30 LAB — HEPATIC FUNCTION PANEL
ALT: 14 U/L (ref 0–35)
AST: 16 U/L (ref 0–37)
Bilirubin, Direct: 0.1 mg/dL (ref 0.0–0.3)
Total Protein: 7.1 g/dL (ref 6.0–8.3)

## 2011-04-30 LAB — CBC WITH DIFFERENTIAL/PLATELET
Basophils Absolute: 0.1 10*3/uL (ref 0.0–0.1)
Basophils Relative: 0.9 % (ref 0.0–3.0)
Eosinophils Absolute: 0 10*3/uL (ref 0.0–0.7)
Lymphocytes Relative: 26.6 % (ref 12.0–46.0)
MCHC: 32.4 g/dL (ref 30.0–36.0)
Monocytes Absolute: 0.6 10*3/uL (ref 0.1–1.0)
Neutrophils Relative %: 64.9 % (ref 43.0–77.0)
Platelets: 236 10*3/uL (ref 150.0–400.0)
RBC: 5.2 Mil/uL — ABNORMAL HIGH (ref 3.87–5.11)
WBC: 7.9 10*3/uL (ref 4.5–10.5)

## 2011-04-30 LAB — LIPID PANEL
HDL: 40.2 mg/dL (ref >39.00)
Triglycerides: 91 mg/dL (ref 0.0–149.0)

## 2011-04-30 LAB — TSH: TSH: 1.24 u[IU]/mL (ref 0.35–5.50)

## 2011-04-30 LAB — BASIC METABOLIC PANEL
BUN: 11 mg/dL (ref 6–23)
CO2: 30 mEq/L (ref 19–32)
Chloride: 104 mEq/L (ref 96–112)
Creatinine, Ser: 0.7 mg/dL (ref 0.4–1.2)
Potassium: 4 mEq/L (ref 3.5–5.1)

## 2011-05-13 ENCOUNTER — Ambulatory Visit (INDEPENDENT_AMBULATORY_CARE_PROVIDER_SITE_OTHER): Payer: Worker's Compensation

## 2011-05-13 DIAGNOSIS — I82409 Acute embolism and thrombosis of unspecified deep veins of unspecified lower extremity: Secondary | ICD-10-CM

## 2011-05-13 DIAGNOSIS — I2699 Other pulmonary embolism without acute cor pulmonale: Secondary | ICD-10-CM

## 2011-05-13 DIAGNOSIS — Z7901 Long term (current) use of anticoagulants: Secondary | ICD-10-CM

## 2011-05-25 ENCOUNTER — Other Ambulatory Visit: Payer: Self-pay | Admitting: Internal Medicine

## 2011-05-25 NOTE — Telephone Encounter (Signed)
Not a patient of the [facility]. 

## 2011-05-25 NOTE — Telephone Encounter (Signed)
Pharmacy aware  - denied per Dr Meredith Pel  - ?  Sent to wrong practice.[facility] not PCP.

## 2011-05-26 ENCOUNTER — Other Ambulatory Visit: Payer: Self-pay | Admitting: Infectious Disease

## 2011-05-26 ENCOUNTER — Ambulatory Visit (INDEPENDENT_AMBULATORY_CARE_PROVIDER_SITE_OTHER): Payer: Worker's Compensation | Admitting: *Deleted

## 2011-05-26 ENCOUNTER — Other Ambulatory Visit: Payer: Self-pay | Admitting: Cardiology

## 2011-05-26 DIAGNOSIS — Z7901 Long term (current) use of anticoagulants: Secondary | ICD-10-CM

## 2011-05-26 DIAGNOSIS — I2699 Other pulmonary embolism without acute cor pulmonale: Secondary | ICD-10-CM

## 2011-05-26 DIAGNOSIS — I82409 Acute embolism and thrombosis of unspecified deep veins of unspecified lower extremity: Secondary | ICD-10-CM

## 2011-05-26 LAB — POCT INR: INR: 3

## 2011-05-26 NOTE — Telephone Encounter (Signed)
Fax Received. Refill Completed. Mia [name] (R.M.A)   

## 2011-06-09 ENCOUNTER — Ambulatory Visit (INDEPENDENT_AMBULATORY_CARE_PROVIDER_SITE_OTHER): Payer: Worker's Compensation | Admitting: *Deleted

## 2011-06-09 DIAGNOSIS — I82409 Acute embolism and thrombosis of unspecified deep veins of unspecified lower extremity: Secondary | ICD-10-CM

## 2011-06-09 DIAGNOSIS — I2699 Other pulmonary embolism without acute cor pulmonale: Secondary | ICD-10-CM

## 2011-06-09 DIAGNOSIS — Z7901 Long term (current) use of anticoagulants: Secondary | ICD-10-CM

## 2011-06-09 LAB — POCT INR: INR: 4.7

## 2011-06-21 ENCOUNTER — Ambulatory Visit (INDEPENDENT_AMBULATORY_CARE_PROVIDER_SITE_OTHER): Payer: BC Managed Care – PPO | Admitting: *Deleted

## 2011-06-21 ENCOUNTER — Ambulatory Visit (INDEPENDENT_AMBULATORY_CARE_PROVIDER_SITE_OTHER): Payer: Worker's Compensation | Admitting: Cardiology

## 2011-06-21 ENCOUNTER — Encounter: Payer: Self-pay | Admitting: Cardiology

## 2011-06-21 VITALS — BP 140/98 | HR 85 | Ht 63.0 in | Wt 332.0 lb

## 2011-06-21 DIAGNOSIS — I119 Hypertensive heart disease without heart failure: Secondary | ICD-10-CM

## 2011-06-21 DIAGNOSIS — I82409 Acute embolism and thrombosis of unspecified deep veins of unspecified lower extremity: Secondary | ICD-10-CM

## 2011-06-21 DIAGNOSIS — I10 Essential (primary) hypertension: Secondary | ICD-10-CM

## 2011-06-21 DIAGNOSIS — I2699 Other pulmonary embolism without acute cor pulmonale: Secondary | ICD-10-CM

## 2011-06-21 DIAGNOSIS — I456 Pre-excitation syndrome: Secondary | ICD-10-CM

## 2011-06-21 DIAGNOSIS — Z7901 Long term (current) use of anticoagulants: Secondary | ICD-10-CM

## 2011-06-21 LAB — POCT INR: INR: 5.6

## 2011-06-21 MED ORDER — TELMISARTAN 80 MG PO TABS: 80.0000 mg | ORAL_TABLET | Freq: Every day | ORAL | Status: DC

## 2011-06-21 MED ORDER — EZETIMIBE 10 MG PO TABS: 10.0000 mg | ORAL_TABLET | Freq: Every day | ORAL | Status: DC

## 2011-06-21 NOTE — Progress Notes (Signed)
[name] Date of Birth:  [birth year] [facility] [address] [place], [place]  [address] [phone]         Fax   [phone]  History of Present Illness: This pleasant 56 year old [place] American woman is seen for a three-month followup office visit.  She has a history of morbid obesity and a complex past medical history which includes deep vein thrombosis and previous pulmonary emboli.  She has been on long-term Coumadin.  She also has essential hypertension and hypercholesterolemia. She has osteoarthritis and has had a previously infected right knee prosthesis and is followed closely by infectious disease and by her orthopedist.  On her previous visit here her electrocardiogram showed a pattern of WPW although the patient has never had any symptoms from this.  Current Outpatient Prescriptions  Medication Sig Dispense Refill  . colchicine 0.6 MG tablet Take 0.6 mg by mouth daily. As needed      . ezetimibe (ZETIA) 10 MG tablet Take 1 tablet (10 mg total) by mouth daily.  30 tablet  3  . furosemide (LASIX) 40 MG tablet TAKE 1 TABLET BY MOUTH DAILY AS NEEDED  90 tablet  3  . guanFACINE (TENEX) 1 MG tablet Take 1 mg by mouth at bedtime.        . methocarbamol (ROBAXIN) 500 MG tablet Take 500 mg by mouth 2 (two) times daily. As needed      . polyethylene glycol powder (GLYCOLAX/MIRALAX) powder TAKE 17 GRAMS BY MOUTH DAILY AS NEEDED FOR CONSTIPATION  527 g  6  . pravastatin (PRAVACHOL) 40 MG tablet TAKE 1 TABLET BY MOUTH EVERY NIGHT AT BEDTIME  30 tablet  6  . warfarin (COUMADIN) 5 MG tablet Take as directed by Anticoagulation clinic Pt takes up to 1 1/2 tablets daily  50 tablet  11  . DISCONTD: ezetimibe (ZETIA) 10 MG tablet Take 10 mg by mouth daily.        Marland Kitchen HYDROcodone-acetaminophen (NORCO) 5-325 MG per tablet Take 1 tablet by mouth every 6 (six) hours as needed for pain.  60 tablet  1  . phentermine 37.5 MG capsule Take 1 capsule (37.5 mg total) by mouth  every morning.  30 capsule  2  . telmisartan (MICARDIS) 80 MG tablet Take 1 tablet (80 mg total) by mouth daily.  30 tablet  3  . DISCONTD: guanFACINE (TENEX) 1 MG tablet TAKE ONE TABLET BY MOUTH DAILY AT BEDTIME  30 tablet  5    Allergies  Allergen Reactions  . Amlodipine     Edema,ha  . Crestor (Rosuvastatin Calcium)     Chest pain  . Piperacillin Sod-Tazobactam So     Patient Active Problem List  Diagnosis  . METHICILLIN RESISTANT STAPHYLOCOCCUS AUREUS INFECTION  . KLEBSIELLA PNEUMONIAE INFECTION  . Bacteremia  . PROSTHETIC JOINT COMPLICATION  . DVT (deep venous thrombosis)  . Hair loss  . Gout  . Leg swelling  . HTN (hypertension)  . Chronic anticoagulation  . Other pulmonary embolism and infarction  . Abscess  . Fall  . Edema of foot  . Infection of prosthetic knee joint  . Dyslipidemia  . Wolff-Parkinson-White (WPW) syndrome  . Preventative health care  . IBS (irritable bowel syndrome)  . Anemia, unspecified  . Diverticulosis  . Colon polyps  . Allergic rhinitis, cause unspecified  . Migraine  . Morbid obesity  . Constipation    History  Smoking status  . Never Smoker   Smokeless tobacco  . Never  Used    History  Alcohol Use No    Family History  Problem Relation Age of Onset  . Colon cancer    . Coronary artery disease      Review of Systems: Constitutional: no fever chills diaphoresis or fatigue or change in weight.  Head and neck: no hearing loss, no epistaxis, no photophobia or visual disturbance. Respiratory: No cough, shortness of breath or wheezing. Cardiovascular: No chest pain peripheral edema, palpitations. Gastrointestinal: No abdominal distention, no abdominal pain, no change in bowel habits hematochezia or melena. Genitourinary: No dysuria, no frequency, no urgency, no nocturia. Musculoskeletal:No arthralgias, no back pain, no gait disturbance or myalgias. Neurological: No dizziness, no headaches, no numbness, no seizures, no  syncope, no weakness, no tremors. Hematologic: No lymphadenopathy, no easy bruising. Psychiatric: No confusion, no hallucinations, no sleep disturbance.    Physical Exam: Filed Vitals:   06/21/11 0850  BP: 140/98  Pulse: 85   the general appearance reveals a large woman in no acute distress.  Her weight is down 7 pounds since last visit.The head and neck exam reveals pupils equal and reactive.  Extraocular movements are full.  There is no scleral icterus.  The mouth and pharynx are normal.  The neck is supple.  The carotids reveal no bruits.  The jugular venous pressure is normal.  The  thyroid is not enlarged.  There is no lymphadenopathy.  The chest is clear to percussion and auscultation.  There are no rales or rhonchi.  Expansion of the chest is symmetrical.  The precordium is quiet.  The first heart sound is normal.  The second heart sound is physiologically split.  There is no murmur gallop rub or click.  There is no abnormal lift or heave.  The abdomen is soft and nontender.  The bowel sounds are normal.  The liver and spleen are not enlarged.  There are no abdominal masses.  There are no abdominal bruits.  Extremities reveal good pedal pulses.  There is no phlebitis or edema.  The right knee is chronically swollen.  There is no cyanosis or clubbing.  Strength is normal and symmetrical in all extremities.  There is no lateralizing weakness.  There are no sensory deficits.  The skin is warm and dry.  There is no rash.  EKG shows normal sinus rhythm and normal AV conduction and no delta waves or evidence of WPW  Assessment / Plan: Continue same medication.  She is on phentermine from Dr. Jonny Ruiz and has lost 7 pounds.  She will be rechecked-pack-year in 3 months for followup office visit.

## 2011-06-21 NOTE — Assessment & Plan Note (Signed)
Her repeat electrocardiogram today shows no evidence of WPW.  She has not been having any intercurrent symptoms suggestive of SVT or atrial fibrillation.

## 2011-06-21 NOTE — Assessment & Plan Note (Signed)
Blood pressure has been improved.  The patient has done better on Brand name Micardis than she did on losartan.  We are continuing her Micardis 80 mg one daily.  No recent chest pain or shortness of breath or palpitations.

## 2011-06-21 NOTE — Assessment & Plan Note (Signed)
The patient remains on long-term Coumadin.  Her INR today is excessive at 5.6.  She has not had any excessive bruising or epistaxis or GI bleeding.  She is followed closely in the Coumadin clinic and they are following her carefully and she will return in one week

## 2011-06-21 NOTE — Patient Instructions (Addendum)
Your physician recommends that you schedule a follow-up appointment in: 3 months with Dr. Brackbill.    

## 2011-06-28 ENCOUNTER — Ambulatory Visit (INDEPENDENT_AMBULATORY_CARE_PROVIDER_SITE_OTHER): Payer: Worker's Compensation | Admitting: Pharmacist

## 2011-06-28 DIAGNOSIS — I82409 Acute embolism and thrombosis of unspecified deep veins of unspecified lower extremity: Secondary | ICD-10-CM

## 2011-06-28 DIAGNOSIS — Z7901 Long term (current) use of anticoagulants: Secondary | ICD-10-CM

## 2011-06-28 DIAGNOSIS — I2699 Other pulmonary embolism without acute cor pulmonale: Secondary | ICD-10-CM

## 2011-06-28 LAB — POCT INR: INR: 3.9

## 2011-07-09 ENCOUNTER — Ambulatory Visit (INDEPENDENT_AMBULATORY_CARE_PROVIDER_SITE_OTHER): Payer: BC Managed Care – PPO | Admitting: *Deleted

## 2011-07-09 DIAGNOSIS — I82409 Acute embolism and thrombosis of unspecified deep veins of unspecified lower extremity: Secondary | ICD-10-CM

## 2011-07-09 DIAGNOSIS — Z7901 Long term (current) use of anticoagulants: Secondary | ICD-10-CM

## 2011-07-09 DIAGNOSIS — I2699 Other pulmonary embolism without acute cor pulmonale: Secondary | ICD-10-CM

## 2011-07-23 ENCOUNTER — Ambulatory Visit (INDEPENDENT_AMBULATORY_CARE_PROVIDER_SITE_OTHER): Payer: BC Managed Care – PPO | Admitting: *Deleted

## 2011-07-23 DIAGNOSIS — Z7901 Long term (current) use of anticoagulants: Secondary | ICD-10-CM

## 2011-07-23 DIAGNOSIS — I82409 Acute embolism and thrombosis of unspecified deep veins of unspecified lower extremity: Secondary | ICD-10-CM

## 2011-07-23 DIAGNOSIS — I2699 Other pulmonary embolism without acute cor pulmonale: Secondary | ICD-10-CM

## 2011-08-13 ENCOUNTER — Ambulatory Visit (INDEPENDENT_AMBULATORY_CARE_PROVIDER_SITE_OTHER): Payer: BC Managed Care – PPO | Admitting: *Deleted

## 2011-08-13 ENCOUNTER — Telehealth: Payer: Self-pay | Admitting: *Deleted

## 2011-08-13 DIAGNOSIS — Z7901 Long term (current) use of anticoagulants: Secondary | ICD-10-CM

## 2011-08-13 DIAGNOSIS — I82409 Acute embolism and thrombosis of unspecified deep veins of unspecified lower extremity: Secondary | ICD-10-CM

## 2011-08-13 DIAGNOSIS — I2699 Other pulmonary embolism without acute cor pulmonale: Secondary | ICD-10-CM

## 2011-08-13 LAB — POCT INR: INR: 4.2

## 2011-08-13 MED ORDER — OMEPRAZOLE 40 MG PO CPDR: 40.0000 mg | DELAYED_RELEASE_CAPSULE | Freq: Every day | ORAL | Status: DC

## 2011-08-13 NOTE — Telephone Encounter (Signed)
Advised patient

## 2011-08-13 NOTE — Telephone Encounter (Signed)
It will be okay for her to resume Prilosec 40 mg daily

## 2011-08-13 NOTE — Telephone Encounter (Signed)
Patient here for coumadin check and c/o indigestion and migraines.  Will contact PCP regarding migraines.  Patient stated she used to be on Prilosec but  Dr. Patty Sermons stopped it.  Will forward to  Dr. Patty Sermons for review

## 2011-08-25 ENCOUNTER — Other Ambulatory Visit: Payer: Self-pay | Admitting: Cardiology

## 2011-08-25 DIAGNOSIS — R52 Pain, unspecified: Secondary | ICD-10-CM

## 2011-08-25 NOTE — Telephone Encounter (Signed)
Samples gathered and put up front.  Called in Rx

## 2011-08-25 NOTE — Telephone Encounter (Signed)
Needs to get her pain meds from PCP

## 2011-08-25 NOTE — Telephone Encounter (Signed)
New msg Pt wants samples of micardis. She wants to discuss hydrocodone refill

## 2011-08-26 MED ORDER — HYDROCODONE-ACETAMINOPHEN 5-325 MG PO TABS: 1.0000 | ORAL_TABLET | Freq: Four times a day (QID) | ORAL | Status: DC | PRN

## 2011-08-27 ENCOUNTER — Ambulatory Visit (INDEPENDENT_AMBULATORY_CARE_PROVIDER_SITE_OTHER): Payer: BC Managed Care – PPO | Admitting: *Deleted

## 2011-08-27 DIAGNOSIS — I82409 Acute embolism and thrombosis of unspecified deep veins of unspecified lower extremity: Secondary | ICD-10-CM

## 2011-08-27 DIAGNOSIS — Z7901 Long term (current) use of anticoagulants: Secondary | ICD-10-CM

## 2011-08-27 DIAGNOSIS — I2699 Other pulmonary embolism without acute cor pulmonale: Secondary | ICD-10-CM

## 2011-09-07 ENCOUNTER — Encounter: Payer: Self-pay | Admitting: Cardiology

## 2011-09-07 ENCOUNTER — Ambulatory Visit (INDEPENDENT_AMBULATORY_CARE_PROVIDER_SITE_OTHER): Payer: BC Managed Care – PPO | Admitting: Cardiology

## 2011-09-07 VITALS — BP 170/96 | HR 89 | Ht 64.0 in | Wt 326.0 lb

## 2011-09-07 DIAGNOSIS — T8489XA Other specified complication of internal orthopedic prosthetic devices, implants and grafts, initial encounter: Secondary | ICD-10-CM

## 2011-09-07 DIAGNOSIS — Z79899 Other long term (current) drug therapy: Secondary | ICD-10-CM

## 2011-09-07 DIAGNOSIS — E7800 Pure hypercholesterolemia, unspecified: Secondary | ICD-10-CM

## 2011-09-07 DIAGNOSIS — I119 Hypertensive heart disease without heart failure: Secondary | ICD-10-CM

## 2011-09-07 DIAGNOSIS — I82409 Acute embolism and thrombosis of unspecified deep veins of unspecified lower extremity: Secondary | ICD-10-CM

## 2011-09-07 DIAGNOSIS — I456 Pre-excitation syndrome: Secondary | ICD-10-CM

## 2011-09-07 NOTE — Assessment & Plan Note (Signed)
She still has difficulty walking on her right knee because it tends to want to give out from time to time.  There is no evidence clinically of any active infection in the knee at this time.  She is not having any chills or fever.  She has an appointment to see Dr. Algis Liming, her infectious disease doctor, in the near future.  Dr. Charlann Boxer is her orthopedist and has told her that eventually she will need to have further surgery on the knee.

## 2011-09-07 NOTE — Patient Instructions (Addendum)
Your physician recommends that you continue on your current medications as directed. Please refer to the Current Medication list given to you today.  Your physician recommends that you schedule a follow-up appointment in: 3 months with fasting labs (LP/BMET/HFP/CBC/TSH)

## 2011-09-07 NOTE — Assessment & Plan Note (Signed)
The patient has lost 6 pounds since last visit.  Her short-term goal is to get down to 300 pounds

## 2011-09-07 NOTE — Assessment & Plan Note (Signed)
The patient has a past history of DVT.  She has not noticed any recent recurrent swelling.  She continues to have a lot of pain in her right knee as well as pain in her back.  She is on long-term Coumadin.

## 2011-09-07 NOTE — Progress Notes (Signed)
[name] Date of Birth:  04-May-1955 [facility] [address] [place], [place]  [address] [phone]  Fax   [phone]  HPI: This pleasant 56 year old African American woman is seen for a three-month followup office visit. She has a history of morbid obesity and a complex past medical history which includes deep vein thrombosis and previous pulmonary emboli. She has been on long-term Coumadin. She also has essential hypertension and hypercholesterolemia. She has osteoarthritis and has had a previously infected right knee prosthesis and is followed closely by infectious disease and by her orthopedist. On a previous visit here her electrocardiogram showed a pattern of WPW although the patient has never had any symptoms from this.  However a subsequent electrocardiogram on her subsequent office visit did not show any evidence of WPW.   Current Outpatient Prescriptions  Medication Sig Dispense Refill  . colchicine 0.6 MG tablet Take 0.6 mg by mouth daily. As needed      . ezetimibe (ZETIA) 10 MG tablet Take 1 tablet (10 mg total) by mouth daily.  30 tablet  3  . furosemide (LASIX) 40 MG tablet TAKE 1 TABLET BY MOUTH DAILY AS NEEDED  90 tablet  3  . guanFACINE (TENEX) 1 MG tablet Take 1 mg by mouth at bedtime.        Marland Kitchen HYDROcodone-acetaminophen (NORCO/VICODIN) 5-325 MG per tablet Take 1 tablet by mouth every 6 (six) hours as needed for pain.  60 tablet  1  . omeprazole (PRILOSEC) 40 MG capsule Take 1 capsule (40 mg total) by mouth daily.  30 capsule  5  . polyethylene glycol powder (GLYCOLAX/MIRALAX) powder TAKE 17 GRAMS BY MOUTH DAILY AS NEEDED FOR CONSTIPATION  527 g  6  . pravastatin (PRAVACHOL) 40 MG tablet TAKE 1 TABLET BY MOUTH EVERY NIGHT AT BEDTIME  30 tablet  6  . telmisartan (MICARDIS) 80 MG tablet Take 1 tablet (80 mg total) by mouth daily.  30 tablet  3  . warfarin (COUMADIN) 5 MG tablet Take as directed by Anticoagulation clinic Pt takes up to 1 1/2  tablets daily  50 tablet  11    Allergies  Allergen Reactions  . Amlodipine     Edema,ha  . Crestor (Rosuvastatin Calcium)     Chest pain  . Piperacillin Sod-Tazobactam So     Patient Active Problem List  Diagnosis  . METHICILLIN RESISTANT STAPHYLOCOCCUS AUREUS INFECTION  . KLEBSIELLA PNEUMONIAE INFECTION  . Bacteremia  . PROSTHETIC JOINT COMPLICATION  . DVT (deep venous thrombosis)  . Hair loss  . Gout  . Leg swelling  . HTN (hypertension)  . Chronic anticoagulation  . Other pulmonary embolism and infarction  . Abscess  . Fall  . Edema of foot  . Infection of prosthetic knee joint  . Dyslipidemia  . Wolff-Parkinson-White (WPW) syndrome  . Preventative health care  . IBS (irritable bowel syndrome)  . Anemia, unspecified  . Diverticulosis  . Colon polyps  . Allergic rhinitis, cause unspecified  . Migraine  . Morbid obesity  . Constipation    History  Smoking status  . Never Smoker   Smokeless tobacco  . Never Used    History  Alcohol Use No    Family History  Problem Relation Age of Onset  . Colon cancer    . Coronary artery disease      Review of Systems: The patient denies any heat or cold intolerance.  No weight gain or weight loss.  The patient denies headaches  or blurry vision.  There is no cough or sputum production.  The patient denies dizziness.  There is no hematuria or hematochezia.  The patient denies any muscle aches or arthritis.  The patient denies any rash.  The patient denies frequent falling or instability.  There is no history of depression or anxiety.  All other systems were reviewed and are negative.   Physical Exam: Filed Vitals:   09/07/11 1115  BP: 170/96  Pulse:    I rechecked her blood pressure and it was 170/96.  The general appearance reveals a morbidly obese woman in no distress.Pupils equal and reactive.   Extraocular Movements are full.  There is no scleral icterus.  The mouth and pharynx are normal.  The neck is supple.   The carotids reveal no bruits.  The jugular venous pressure is normal.  The thyroid is not enlarged.  There is no lymphadenopathy.  The chest is clear to percussion and auscultation. There are no rales or rhonchi. Expansion of the chest is symmetrical.  The precordium is quiet.  The first heart sound is normal.  The second heart sound is physiologically split.  There is no murmur gallop rub or click.  There is no abnormal lift or heave.  The abdomen is soft and nontender. Bowel sounds are normal. The liver and spleen are not enlarged. There Are no abdominal masses. There are no bruits.  Extremities reveal chronically swollen right knee.  There is no significant peripheral edema today and no evidence of DVT.   Assessment / Plan: Continue same medication.  Continue efforts at weight loss.  Recheck here in 3 months for followup office visit lipid panel hepatic function panel basal metabolic panel CBC and TSH.  Consider repeat EKG at next visit

## 2011-09-07 NOTE — Assessment & Plan Note (Signed)
The patient has not been aware of any tachycardia or rapid heartbeat to suggest paroxysmal supraventricular arrhythmia

## 2011-09-17 ENCOUNTER — Ambulatory Visit (INDEPENDENT_AMBULATORY_CARE_PROVIDER_SITE_OTHER): Payer: BC Managed Care – PPO | Admitting: *Deleted

## 2011-09-17 DIAGNOSIS — I2699 Other pulmonary embolism without acute cor pulmonale: Secondary | ICD-10-CM

## 2011-09-17 DIAGNOSIS — I82409 Acute embolism and thrombosis of unspecified deep veins of unspecified lower extremity: Secondary | ICD-10-CM

## 2011-09-17 DIAGNOSIS — Z7901 Long term (current) use of anticoagulants: Secondary | ICD-10-CM

## 2011-09-17 LAB — POCT INR: INR: 2

## 2011-09-23 IMAGING — CT CT EXTREM LOW W/ CM*R*
1 of 2 series · 13 of 32 positions shown, 19 images · IV contrast (agent unspecified)
Comparison: Plain films right knee 01/05/2010.

CLINICAL DATA: Patient status post total knee replacement 6 weeks
ago with incision and drainage times four.  Right knee pain,
redness and swelling.

CT RIGHT KNEE WITH CONTRAST
TECHNIQUE: Multidetector CT imaging of the right knee was
performed according to the standard protocol following intravenous
contrast administration. Multiplanar CT image reconstructions were
also generated.
Contrast: 100 ml Ymnipaque-A00.

[Series 4: pelvis_hip 3.0 b60s st · axial · 0.55mm/px · z∈[-768,-102]mm · 13 of 258 slices shown, 19 images]
[im 18/258  soft-tissue]
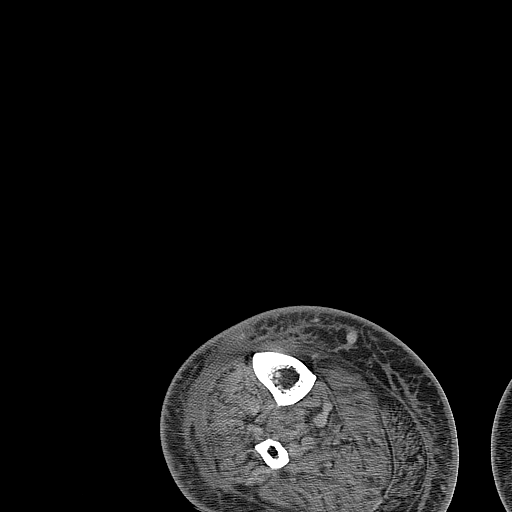
[im 18/258  bone]
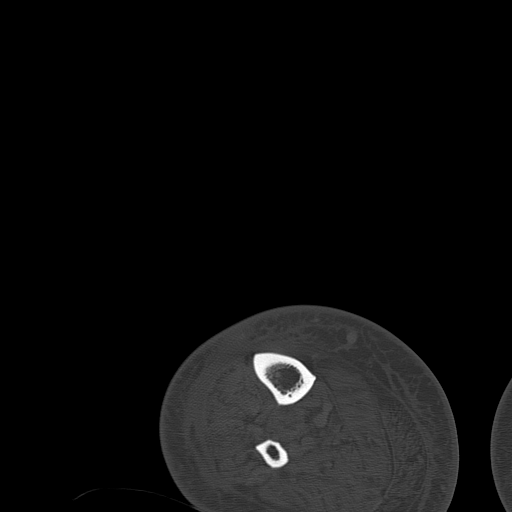
[im 35/258  soft-tissue]
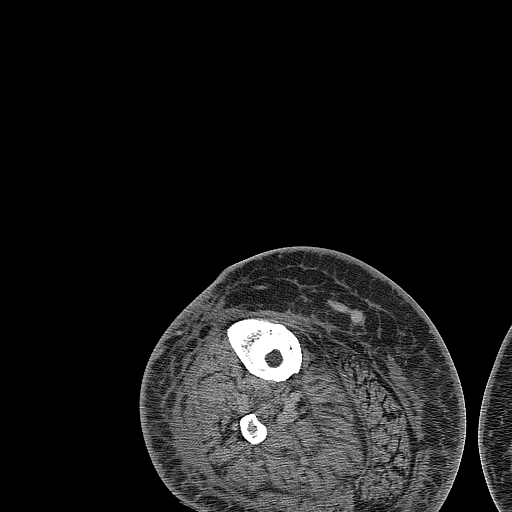
[im 52/258  soft-tissue]
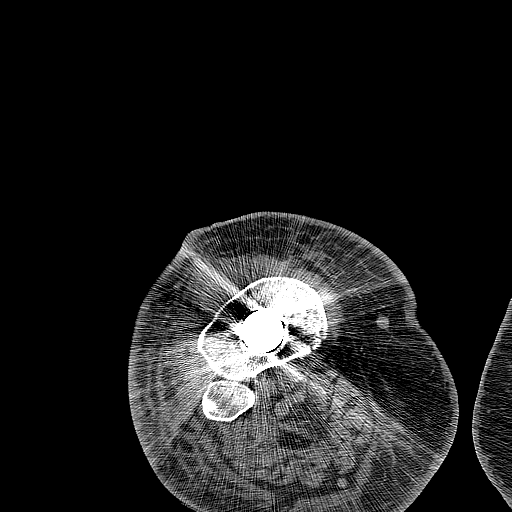
[im 69/258  soft-tissue]
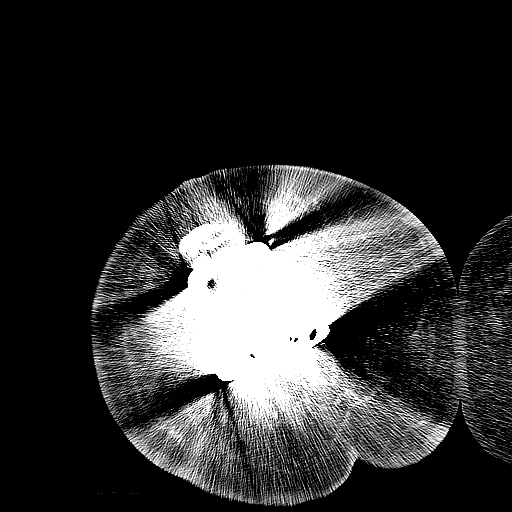
[im 86/258  soft-tissue]
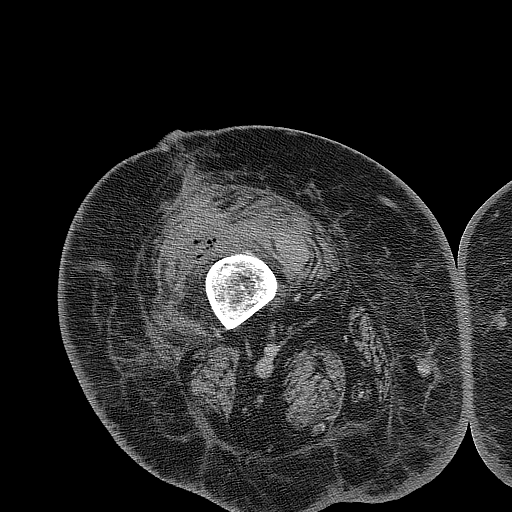
[im 103/258  soft-tissue]
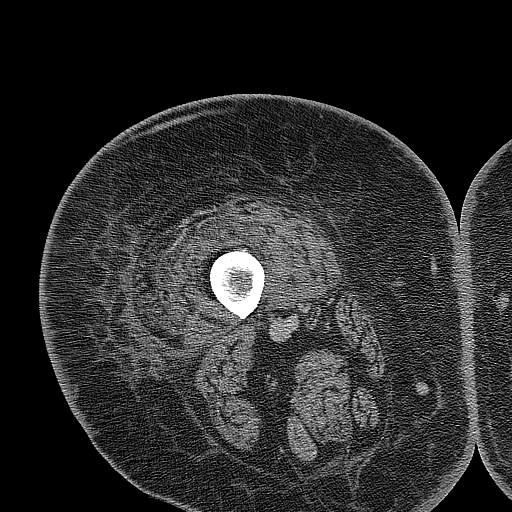
[im 138/258  soft-tissue]
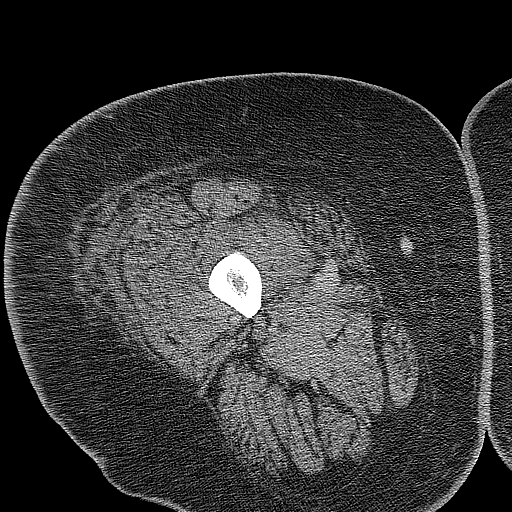
[im 155/258  soft-tissue]
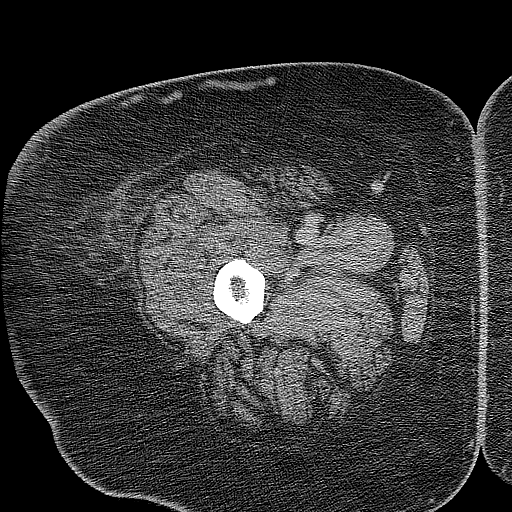
[im 172/258  soft-tissue]
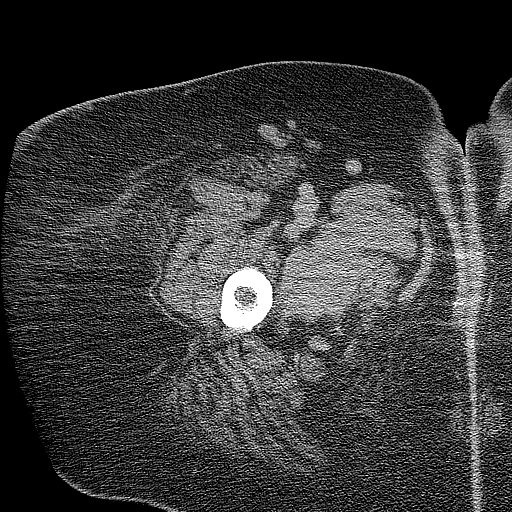
[im 172/258  bone]
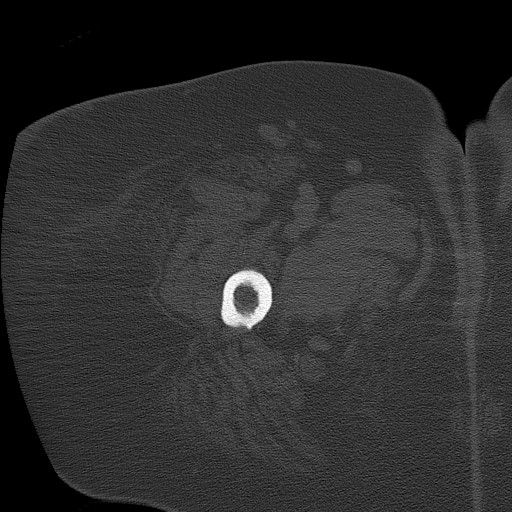
[im 189/258  soft-tissue]
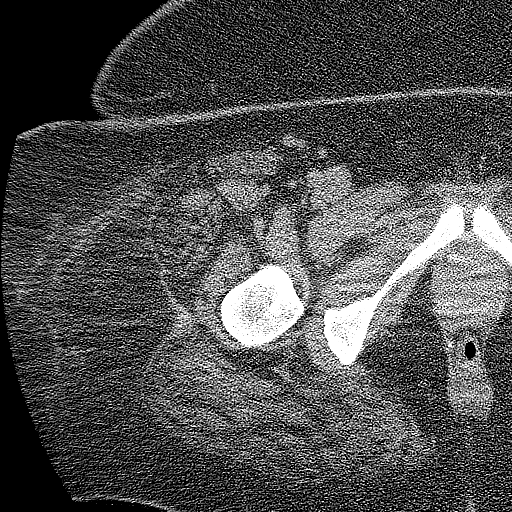
[im 189/258  lung]
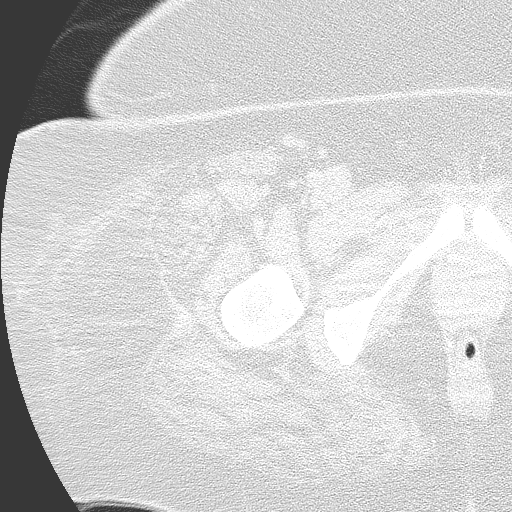
[im 206/258  soft-tissue]
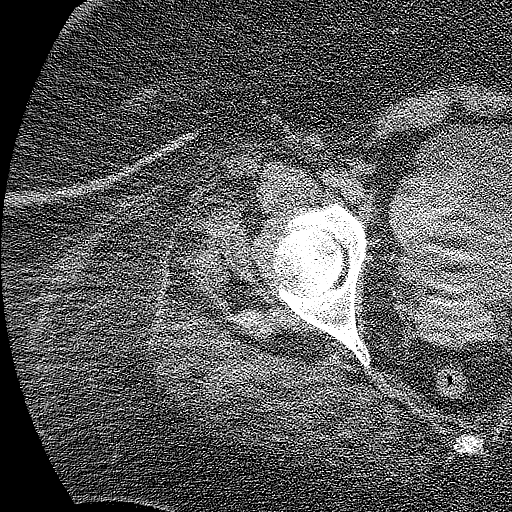
[im 206/258  lung]
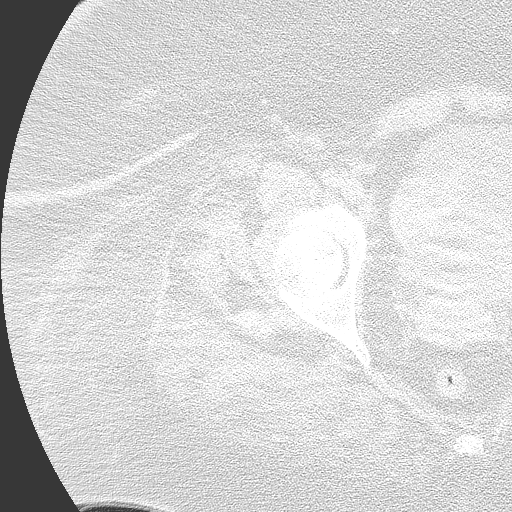
[im 223/258  soft-tissue]
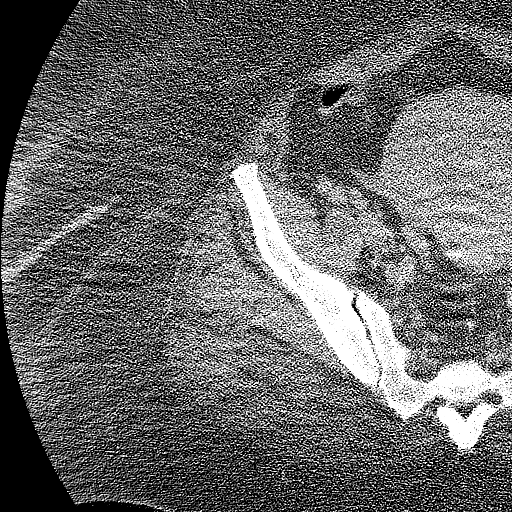
[im 223/258  lung]
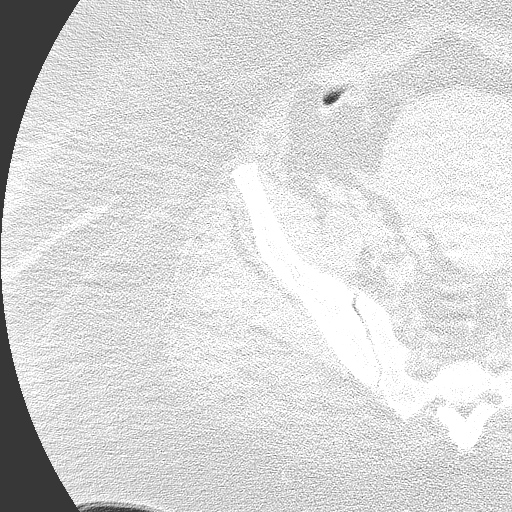
[im 240/258  soft-tissue]
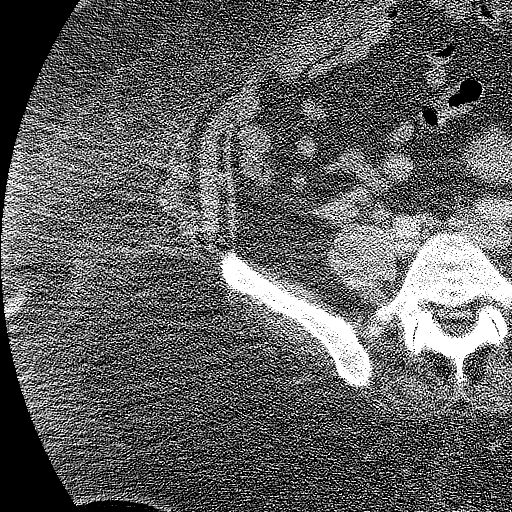
[im 240/258  lung]
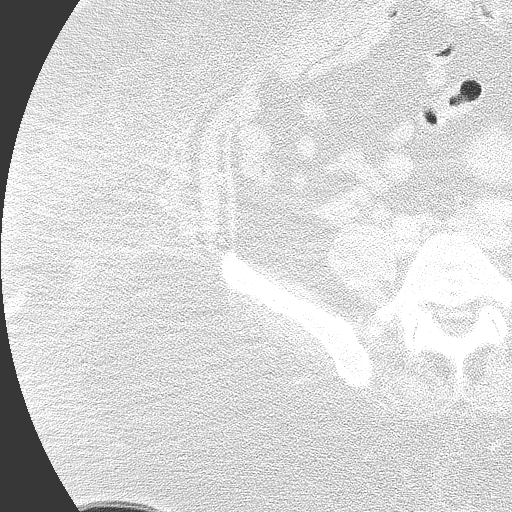

[13 of 32 positions shown; findings below may reference images not displayed]

FINDINGS: A total knee replacement device is in place and creates
streak artifact on examination.  The patient has arthroplasty
appears located and no bony destructive change is identified.
There is no fracture.  The patient has a large joint effusion with
multiple small locules of gas within it.  Surrounding subcutaneous
fatty tissues are infiltrated.  Infiltration of subcutaneous fat
extends both above and below the knee joint.  No focal fluid
collection within muscle is identified.  Fat planes within muscle
appear preserved.
IMPRESSION: Status post right total knee replacement with a large joint
effusion containing locules of gas.  While locules of gas may be
related prior incision and drainage, findings remain worrisome for
septic joint.  There is no bony destructive change.  No CT evidence
of myositis.  Infiltration of subcutaneous fat compatible with
cellulitis or dependent change again noted.

## 2011-10-14 ENCOUNTER — Telehealth: Payer: Self-pay | Admitting: Cardiology

## 2011-10-14 NOTE — Telephone Encounter (Signed)
micardis 80 mg lot #[id number] A exp 8/16 2 packs  zetia 10 mg 4 packs lot #[id number] exp 1/16  Samples left up front for patient

## 2011-10-14 NOTE — Telephone Encounter (Signed)
plz return call to pt regarding micardis and zetia  Medication. Pt can be reached at hM#

## 2011-10-15 ENCOUNTER — Ambulatory Visit (INDEPENDENT_AMBULATORY_CARE_PROVIDER_SITE_OTHER): Payer: BC Managed Care – PPO | Admitting: *Deleted

## 2011-10-15 DIAGNOSIS — I82409 Acute embolism and thrombosis of unspecified deep veins of unspecified lower extremity: Secondary | ICD-10-CM

## 2011-10-15 DIAGNOSIS — I2699 Other pulmonary embolism without acute cor pulmonale: Secondary | ICD-10-CM

## 2011-10-15 DIAGNOSIS — Z7901 Long term (current) use of anticoagulants: Secondary | ICD-10-CM

## 2011-10-15 LAB — POCT INR: INR: 3.6

## 2011-10-19 ENCOUNTER — Other Ambulatory Visit: Payer: Self-pay | Admitting: Cardiology

## 2011-10-22 ENCOUNTER — Other Ambulatory Visit: Payer: Self-pay | Admitting: *Deleted

## 2011-10-22 DIAGNOSIS — R52 Pain, unspecified: Secondary | ICD-10-CM

## 2011-10-22 MED ORDER — HYDROCODONE-ACETAMINOPHEN 5-325 MG PO TABS: 1.0000 | ORAL_TABLET | Freq: Four times a day (QID) | ORAL | Status: DC | PRN

## 2011-10-28 ENCOUNTER — Ambulatory Visit: Payer: Self-pay | Admitting: Cardiology

## 2011-10-28 ENCOUNTER — Ambulatory Visit: Payer: BC Managed Care – PPO | Admitting: Internal Medicine

## 2011-11-01 ENCOUNTER — Telehealth: Payer: Self-pay | Admitting: Cardiology

## 2011-11-01 NOTE — Telephone Encounter (Signed)
Patient called was told office out of micardis samples.

## 2011-11-01 NOTE — Telephone Encounter (Signed)
Plz return call to patient [phone] regarding Micardis samples

## 2011-11-03 ENCOUNTER — Encounter: Payer: Self-pay | Admitting: Infectious Disease

## 2011-11-03 ENCOUNTER — Ambulatory Visit (INDEPENDENT_AMBULATORY_CARE_PROVIDER_SITE_OTHER): Payer: BC Managed Care – PPO | Admitting: Infectious Disease

## 2011-11-03 VITALS — BP 144/99 | HR 83 | Temp 97.9°F | Wt 334.0 lb

## 2011-11-03 DIAGNOSIS — K5900 Constipation, unspecified: Secondary | ICD-10-CM

## 2011-11-03 DIAGNOSIS — T8459XA Infection and inflammatory reaction due to other internal joint prosthesis, initial encounter: Secondary | ICD-10-CM

## 2011-11-03 DIAGNOSIS — T8450XA Infection and inflammatory reaction due to unspecified internal joint prosthesis, initial encounter: Secondary | ICD-10-CM

## 2011-11-03 DIAGNOSIS — A4902 Methicillin resistant Staphylococcus aureus infection, unspecified site: Secondary | ICD-10-CM

## 2011-11-03 DIAGNOSIS — Z96659 Presence of unspecified artificial knee joint: Secondary | ICD-10-CM

## 2011-11-03 LAB — C-REACTIVE PROTEIN: CRP: 0.8 mg/dL — ABNORMAL HIGH (ref <0.60)

## 2011-11-03 NOTE — Progress Notes (Signed)
Subjective:    Patient ID: Mia [name], female    DOB: July 22, 1955, 56 y.o.   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 She was being treated with daptomycin due to concern about a rxn to vancomycin. During her stay she developed allergic rash on zosyn and cefepime. She was on the daptomycin and developed a PICC line>N infection with e coli and klebsiella and was treated with cipro. She had been off of antibiotics for most of this year until she had trauma with swollen foot that I performed I and D on (was largely a hematoma). She has been seen by Dr. Lestine Box and Dr. Charlann Boxer for concerns of recurrence of infection in the right knee where she has loosening. Her ESR had come up above 25  A few months ago and Per the pt aspirate of her right knee showed 10K wbc in Dr. Boneta Lucks office.  I  Had taken her offantibiotics in anticipation of repeat aspiration of her knee by Dr. Charlann Boxer. Since then she actually improved with less pain and no fevers ro chllls or systemic symptoms.  I saw her this fall and her ESR had come down further to 8 and she was doing well. Today for followup of his said she feels better she continues to have some pain in the joint with weightbearing. She has no erythema no fevers malaise or other systemic symptoms.  Review of Systems  Constitutional: Negative for fever, chills, diaphoresis, activity change, appetite change, fatigue and unexpected weight change.  HENT: Negative for congestion, sore throat, rhinorrhea, sneezing, trouble swallowing and sinus pressure.   Eyes: Negative for photophobia and visual disturbance.  Respiratory: Negative for cough, chest tightness, shortness of breath, wheezing and stridor.   Cardiovascular: Positive for leg swelling. Negative for chest pain and palpitations.  Gastrointestinal: Positive for constipation. Negative for nausea,  vomiting, abdominal pain, diarrhea, blood in stool, abdominal distention and anal bleeding.  Genitourinary: Negative for dysuria, hematuria, flank pain and difficulty urinating.  Musculoskeletal: Positive for gait problem. Negative for myalgias, back pain, joint swelling and arthralgias.  Skin: Negative for color change, pallor, rash and wound.  Neurological: Negative for dizziness, tremors, weakness and light-headedness.  Hematological: Negative for adenopathy. Does not bruise/bleed easily.  Psychiatric/Behavioral: Negative for behavioral problems, confusion, disturbed wake/sleep cycle, dysphoric mood, decreased concentration and agitation.       Objective:   Physical Exam  Constitutional: She is oriented to person, place, and time. She appears well-developed and well-nourished. No distress.  HENT:  Head: Normocephalic and atraumatic.  Mouth/Throat: Oropharynx is clear and moist. No oropharyngeal exudate.  Eyes: Conjunctivae normal and EOM are normal. Pupils are equal, round, and reactive to light. No scleral icterus.  Neck: Normal range of motion. Neck supple. No JVD present.  Cardiovascular: Normal rate, regular rhythm and normal heart sounds.  Exam reveals no gallop and no friction rub.   No murmur heard. Pulmonary/Chest: Effort normal and breath sounds normal. No respiratory distress. She has no wheezes. She has no rales. She exhibits no tenderness.  Abdominal: She exhibits no distension and no mass. There is no tenderness. There is no rebound and no guarding.  Musculoskeletal: She exhibits no edema and no tenderness.       Legs: Lymphadenopathy:    She has no cervical adenopathy.  Neurological: She is alert and oriented to person, place, and time.  She has normal reflexes. She exhibits normal muscle tone. Coordination normal.  Skin: Skin is warm and dry. She is not diaphoretic. No erythema. No pallor.  Psychiatric: She has a normal mood and affect. Her behavior is normal. Judgment  and thought content normal.          Assessment & Plan:  MRSA prosthetic joint infection: Status post parenteral antibiotics after polyethylene exchange continue to observe off antibiotics check sedimentation rate and C-reactive protein today.  Constipation: Followup with her primary care physician regarding this.

## 2011-11-05 ENCOUNTER — Ambulatory Visit (INDEPENDENT_AMBULATORY_CARE_PROVIDER_SITE_OTHER): Payer: BC Managed Care – PPO | Admitting: *Deleted

## 2011-11-05 DIAGNOSIS — Z7901 Long term (current) use of anticoagulants: Secondary | ICD-10-CM

## 2011-11-05 DIAGNOSIS — I82409 Acute embolism and thrombosis of unspecified deep veins of unspecified lower extremity: Secondary | ICD-10-CM

## 2011-11-05 DIAGNOSIS — I2699 Other pulmonary embolism without acute cor pulmonale: Secondary | ICD-10-CM

## 2011-11-05 LAB — POCT INR: INR: 1.8

## 2011-11-10 ENCOUNTER — Ambulatory Visit (INDEPENDENT_AMBULATORY_CARE_PROVIDER_SITE_OTHER): Payer: BC Managed Care – PPO | Admitting: Internal Medicine

## 2011-11-10 ENCOUNTER — Encounter: Payer: Self-pay | Admitting: Internal Medicine

## 2011-11-10 VITALS — BP 142/100 | HR 75 | Temp 97.4°F | Ht 63.0 in | Wt 333.5 lb

## 2011-11-10 DIAGNOSIS — K219 Gastro-esophageal reflux disease without esophagitis: Secondary | ICD-10-CM

## 2011-11-10 DIAGNOSIS — K5900 Constipation, unspecified: Secondary | ICD-10-CM

## 2011-11-10 DIAGNOSIS — I10 Essential (primary) hypertension: Secondary | ICD-10-CM

## 2011-11-10 HISTORY — DX: Gastro-esophageal reflux disease without esophagitis: K21.9

## 2011-11-10 MED ORDER — PANTOPRAZOLE SODIUM 40 MG PO TBEC: 40.0000 mg | DELAYED_RELEASE_TABLET | Freq: Every day | ORAL | Status: DC

## 2011-11-10 MED ORDER — LINACLOTIDE 290 MCG PO CAPS: 1.0000 | ORAL_CAPSULE | Freq: Every day | ORAL | Status: DC

## 2011-11-10 NOTE — Assessment & Plan Note (Signed)
To try Linzess asd, 1 qd,  to f/u any worsening symptoms or concerns

## 2011-11-10 NOTE — Assessment & Plan Note (Signed)
Uncontrolled, out of micardis 80 mg for 3 days, gave samples benicar 40 mg for 4 wk, then to re-start micardis

## 2011-11-10 NOTE — Progress Notes (Signed)
Subjective:    Patient ID: Mia [name], female    DOB: Jul 12, 1955, 56 y.o.   MRN: [id number]  HPI  Here to f/u, out of BP meds for 3 days due to cost.  Has mild worsening reflux for 1-2 wks but Denies worsening, dysphagia, abd pain, n/v, bowel change or blood except for worsening constipation, very uncomfortable with ? Radiation to the back.  Pt continues to have recurring LBP without change in severity, bowel or bladder change, fever, wt loss,  worsening LE pain/numbness/weakness, gait change or falls.  Also has ongoing right knee pain with perhaps a mechanical issue, plans to f/u ortho, also follow closely with Dr Charlann Boxer and Patty Sermons. Denies worsening depressive symptoms, suicidal ideation, or panic, though has ongoing anxiety, increased in the past wk as well more than usual.  Pt denies chest pain, increased sob or doe, wheezing, orthopnea, PND, increased LE swelling, palpitations, dizziness or syncope.  Pt denies new neurological symptoms such as new headache, or facial or extremity weakness or numbness   Pt denies fever, wt loss, night sweats, loss of appetite, or other constitutional symptoms Past Medical History  Diagnosis Date  . Hypertension   . Morbid obesity   . Nonsmoker   . Pulmonary embolism 2010    following right knee replacement  . DVT (deep venous thrombosis)     right upper extremitiy  . Chronic anticoagulation   . Infected prosthetic knee joint   . H/O: GI bleed   . Anemia   . IBS (irritable bowel syndrome)   . Hyperlipidemia   . WPW (Wolff-Parkinson-White syndrome)   . Anemia, unspecified 04/23/2011  . Diverticulosis   . Colon polyps may 2011  . Allergic rhinitis, cause unspecified 04/29/2011  . Migraine 04/29/2011  . Gout 04/22/2010   Past Surgical History  Procedure Date  . Right knee replacement 2010    followed by I & D for infection  . Cholecystectomy   . Tubal ligation     reports that she has never smoked. She has never used smokeless tobacco. She  reports that she does not drink alcohol or use illicit drugs. family history includes Colon cancer in an unspecified family member and Coronary artery disease in an unspecified family member. Allergies  Allergen Reactions  . Amlodipine     Edema,ha  . Crestor (Rosuvastatin Calcium)     Chest pain  . Piperacillin Sod-Tazobactam So    Current Outpatient Prescriptions on File Prior to Visit  Medication Sig Dispense Refill  . colchicine 0.6 MG tablet Take 0.6 mg by mouth daily. As needed      . ezetimibe (ZETIA) 10 MG tablet Take 1 tablet (10 mg total) by mouth daily.  30 tablet  3  . furosemide (LASIX) 40 MG tablet TAKE 1 TABLET BY MOUTH DAILY AS NEEDED  90 tablet  3  . guanFACINE (TENEX) 1 MG tablet Take 1 mg by mouth at bedtime.        Marland Kitchen HYDROcodone-acetaminophen (NORCO/VICODIN) 5-325 MG per tablet Take 1 tablet by mouth every 6 (six) hours as needed for pain.  60 tablet  1  . omeprazole (PRILOSEC) 40 MG capsule Take 1 capsule (40 mg total) by mouth daily.  30 capsule  5  . polyethylene glycol powder (GLYCOLAX/MIRALAX) powder TAKE 17 GRAMS BY MOUTH DAILY AS NEEDED FOR CONSTIPATION  527 g  6  . pravastatin (PRAVACHOL) 40 MG tablet TAKE 1 TABLET BY MOUTH EVERY NIGHT AT BEDTIME  30 tablet  11  .  telmisartan (MICARDIS) 80 MG tablet Take 1 tablet (80 mg total) by mouth daily.  30 tablet  3  . warfarin (COUMADIN) 5 MG tablet Take as directed by Anticoagulation clinic Pt takes up to 1 1/2 tablets daily  50 tablet  11  . Linaclotide (LINZESS) 290 MCG CAPS Take 1 capsule by mouth daily.  90 capsule  3   Review of Systems  Constitutional: Negative for diaphoresis and unexpected weight change.  HENT: Negative for tinnitus.   Eyes: Negative for photophobia and visual disturbance.  Respiratory: Negative for choking and stridor.   Gastrointestinal: Negative for vomiting and blood in stool.  Genitourinary: Negative for hematuria and decreased urine volume.  Musculoskeletal: Negative for acute joint  swelling Skin: Negative for color change and wound.  Neurological: Negative for tremors and numbness.  Psychiatric/Behavioral: Negative for decreased concentration. The patient is not hyperactive.       Objective:   Physical Exam BP 142/100  Pulse 75  Temp 97.4 F (36.3 C) (Oral)  Ht 5\' 3"  (1.6 m)  Wt 333 lb 8 oz (151.275 kg)  BMI 59.08 kg/m2  SpO2 97% Physical Exam  VS noted Constitutional: Pt appears well-developed and well-nourished.  HENT: Head: Normocephalic.  Right Ear: External ear normal.  Left Ear: External ear normal.  Eyes: Conjunctivae and EOM are normal. Pupils are equal, round, and reactive to light.  Neck: Normal range of motion. Neck supple.  Cardiovascular: Normal rate and regular rhythm.   Pulmonary/Chest: Effort normal and breath sounds normal.  Abd:  Soft, NT, ? Mild bloated, firm, + BS Neurological: Pt is alert. Not confused  Skin: Skin is warm. No erythema.  Psychiatric: Pt behavior is normal. Thought content normal  1-2+ nervous.     Assessment & Plan:

## 2011-11-10 NOTE — Patient Instructions (Addendum)
Please take the samples of benicar 40 mg  - 1 per day  - in place of the micardis for now, until the samples are out OK to stop the prilosec  Take all new medications as prescribed - the generic protonix at 40 mg per day, and the Linzess samples and prescription Continue all other medications as before Please keep your appointments with your specialists as you have planned Please return in April 2014 with Lab testing done 3-5 days before

## 2011-11-10 NOTE — Assessment & Plan Note (Signed)
Ok to change prilosec to generic protonix,  to f/u any worsening symptoms or concerns

## 2011-11-19 ENCOUNTER — Ambulatory Visit (INDEPENDENT_AMBULATORY_CARE_PROVIDER_SITE_OTHER): Payer: BC Managed Care – PPO

## 2011-11-19 DIAGNOSIS — I2699 Other pulmonary embolism without acute cor pulmonale: Secondary | ICD-10-CM

## 2011-11-19 DIAGNOSIS — I82409 Acute embolism and thrombosis of unspecified deep veins of unspecified lower extremity: Secondary | ICD-10-CM

## 2011-11-19 DIAGNOSIS — Z7901 Long term (current) use of anticoagulants: Secondary | ICD-10-CM

## 2011-12-01 ENCOUNTER — Telehealth: Payer: Self-pay | Admitting: Cardiology

## 2011-12-01 NOTE — Telephone Encounter (Signed)
Pt requesting samples of zetia and mycardis

## 2011-12-01 NOTE — Telephone Encounter (Signed)
Spoke with pt, aware zetia samples placed at the front desk for pick up.

## 2011-12-03 ENCOUNTER — Other Ambulatory Visit: Payer: Self-pay

## 2011-12-03 MED ORDER — GUANFACINE HCL 1 MG PO TABS: 1.0000 mg | ORAL_TABLET | Freq: Every day | ORAL | Status: DC

## 2011-12-14 ENCOUNTER — Encounter: Payer: Self-pay | Admitting: Cardiology

## 2011-12-14 ENCOUNTER — Other Ambulatory Visit (INDEPENDENT_AMBULATORY_CARE_PROVIDER_SITE_OTHER): Payer: Worker's Compensation

## 2011-12-14 ENCOUNTER — Ambulatory Visit (INDEPENDENT_AMBULATORY_CARE_PROVIDER_SITE_OTHER): Payer: Worker's Compensation | Admitting: Cardiology

## 2011-12-14 ENCOUNTER — Ambulatory Visit (INDEPENDENT_AMBULATORY_CARE_PROVIDER_SITE_OTHER): Payer: Worker's Compensation

## 2011-12-14 VITALS — BP 138/116 | HR 81 | Resp 18 | Ht 63.0 in | Wt 338.0 lb

## 2011-12-14 DIAGNOSIS — R0989 Other specified symptoms and signs involving the circulatory and respiratory systems: Secondary | ICD-10-CM

## 2011-12-14 DIAGNOSIS — Z7901 Long term (current) use of anticoagulants: Secondary | ICD-10-CM | POA: Diagnosis not present

## 2011-12-14 DIAGNOSIS — I2699 Other pulmonary embolism without acute cor pulmonale: Secondary | ICD-10-CM

## 2011-12-14 DIAGNOSIS — I82409 Acute embolism and thrombosis of unspecified deep veins of unspecified lower extremity: Secondary | ICD-10-CM | POA: Diagnosis not present

## 2011-12-14 DIAGNOSIS — Z79899 Other long term (current) drug therapy: Secondary | ICD-10-CM

## 2011-12-14 DIAGNOSIS — I10 Essential (primary) hypertension: Secondary | ICD-10-CM

## 2011-12-14 DIAGNOSIS — E7800 Pure hypercholesterolemia, unspecified: Secondary | ICD-10-CM

## 2011-12-14 DIAGNOSIS — I119 Hypertensive heart disease without heart failure: Secondary | ICD-10-CM

## 2011-12-14 LAB — LIPID PANEL
Cholesterol: 177 mg/dL (ref 0–200)
HDL: 44 mg/dL (ref >39.00)
VLDL: 23.8 mg/dL (ref 0.0–40.0)

## 2011-12-14 LAB — HEPATIC FUNCTION PANEL
ALT: 13 U/L (ref 0–35)
Albumin: 3.4 g/dL — ABNORMAL LOW (ref 3.5–5.2)
Total Protein: 7.3 g/dL (ref 6.0–8.3)

## 2011-12-14 LAB — CBC WITH DIFFERENTIAL/PLATELET
Basophils Relative: 1.1 % (ref 0.0–3.0)
Eosinophils Relative: 0.5 % (ref 0.0–5.0)
HCT: 44.7 % (ref 36.0–46.0)
Lymphs Abs: 2.2 10*3/uL (ref 0.7–4.0)
MCV: 83.7 fl (ref 78.0–100.0)
Monocytes Absolute: 0.5 10*3/uL (ref 0.1–1.0)
RBC: 5.34 Mil/uL — ABNORMAL HIGH (ref 3.87–5.11)
WBC: 8.3 10*3/uL (ref 4.5–10.5)

## 2011-12-14 LAB — BASIC METABOLIC PANEL
GFR: 110.23 mL/min (ref >60.00)
Potassium: 3.4 mEq/L — ABNORMAL LOW (ref 3.5–5.1)
Sodium: 139 mEq/L (ref 135–145)

## 2011-12-14 NOTE — Assessment & Plan Note (Signed)
Patient's blood pressure is poorly controlled today because of no medication for the past 2 weeks.  We gave her some samples of Benicar 40 mg which we had on hand.  Normally she takes Micardis 80 mg.

## 2011-12-14 NOTE — Patient Instructions (Addendum)
Your physician recommends that you continue on your current medications as directed. Please refer to the Current Medication list given to you today.  Your physician recommends that you schedule a follow-up appointment in: 3 month ov/ekg  Will obtain labs today and call you with the results (lp/bmet/hfp/cbc/tsh)

## 2011-12-14 NOTE — Progress Notes (Signed)
Mia [name] Date of Birth:  02/19/1955 [facility] [address] [place], [place]  [address] [phone]         Fax   [phone]  History of Present Illness: This pleasant 56 year old [place] American woman is seen for a three-month followup office visit. She has a history of morbid obesity and a complex past medical history which includes deep vein thrombosis and previous pulmonary emboli. She has been on long-term Coumadin. She also has essential hypertension and hypercholesterolemia. She has osteoarthritis and has had a previously infected right knee prosthesis and is followed closely by infectious disease and by her orthopedist. On a previous visit here her electrocardiogram showed a pattern of WPW although the patient has never had any symptoms from this. However a subsequent electrocardiogram on her subsequent office visit did not show any evidence of WPW.  She continues to be under a lot of stress.  Her Hx from worker's compensation are delayed 2 weeks and for this reason she has not been able to purchase any blood pressure medication and ran out about 2 weeks ago.   Current Outpatient Prescriptions  Medication Sig Dispense Refill  . colchicine 0.6 MG tablet Take 0.6 mg by mouth daily. As needed      . ezetimibe (ZETIA) 10 MG tablet Take 1 tablet (10 mg total) by mouth daily.  30 tablet  3  . furosemide (LASIX) 40 MG tablet TAKE 1 TABLET BY MOUTH DAILY AS NEEDED  90 tablet  3  . guanFACINE (TENEX) 1 MG tablet Take 1 tablet (1 mg total) by mouth at bedtime.  30 tablet  0  . HYDROcodone-acetaminophen (NORCO/VICODIN) 5-325 MG per tablet Take 1 tablet by mouth every 6 (six) hours as needed for pain.  60 tablet  1  . Linaclotide (LINZESS) 290 MCG CAPS Take 1 capsule by mouth daily.  90 capsule  3  . polyethylene glycol powder (GLYCOLAX/MIRALAX) powder TAKE 17 GRAMS BY MOUTH DAILY AS NEEDED FOR CONSTIPATION  527 g  6  . pravastatin (PRAVACHOL) 40 MG tablet TAKE  1 TABLET BY MOUTH EVERY NIGHT AT BEDTIME  30 tablet  11  . warfarin (COUMADIN) 5 MG tablet Take as directed by Anticoagulation clinic Pt takes up to 1 1/2 tablets daily  50 tablet  11    Allergies  Allergen Reactions  . Amlodipine     Edema,ha  . Crestor (Rosuvastatin Calcium)     Chest pain  . Piperacillin Sod-Tazobactam So     Patient Active Problem List  Diagnosis  . METHICILLIN RESISTANT STAPHYLOCOCCUS AUREUS INFECTION  . KLEBSIELLA PNEUMONIAE INFECTION  . Bacteremia  . PROSTHETIC JOINT COMPLICATION  . DVT (deep venous thrombosis)  . Hair loss  . Gout  . Leg swelling  . HTN (hypertension)  . Chronic anticoagulation  . Other pulmonary embolism and infarction  . Fall  . Edema of foot  . Infection of prosthetic knee joint  . Dyslipidemia  . Wolff-Parkinson-White (WPW) syndrome  . Preventative health care  . IBS (irritable bowel syndrome)  . Anemia, unspecified  . Diverticulosis  . Colon polyps  . Allergic rhinitis, cause unspecified  . Migraine  . Morbid obesity  . Constipation  . GERD (gastroesophageal reflux disease)    History  Smoking status  . Never Smoker   Smokeless tobacco  . Never Used    History  Alcohol Use No    Family History  Problem Relation Age of Onset  . Colon cancer    .  Coronary artery disease      Review of Systems: Constitutional: no fever chills diaphoresis or fatigue or change in weight.  Head and neck: no hearing loss, no epistaxis, no photophobia or visual disturbance. Respiratory: No cough, shortness of breath or wheezing. Cardiovascular: No chest pain peripheral edema, palpitations. Gastrointestinal: No abdominal distention, no abdominal pain, no change in bowel habits hematochezia or melena. Genitourinary: No dysuria, no frequency, no urgency, no nocturia. Musculoskeletal:No arthralgias, no back pain, no gait disturbance or myalgias. Neurological: No dizziness, no headaches, no numbness, no seizures, no syncope, no  weakness, no tremors. Hematologic: No lymphadenopathy, no easy bruising. Psychiatric: No confusion, no hallucinations, no sleep disturbance.    Physical Exam: Filed Vitals:   12/14/11 1003  BP: 138/116  Pulse: 81  Resp: 18   the general appearance reveals an overweight woman in no acute distress.The head and neck exam reveals pupils equal and reactive.  Extraocular movements are full.  There is no scleral icterus.  The mouth and pharynx are normal.  The neck is supple.  The carotids reveal no bruits.  The jugular venous pressure is normal.  The  thyroid is not enlarged.  There is no lymphadenopathy.  The chest is clear to percussion and auscultation.  There are no rales or rhonchi.  Expansion of the chest is symmetrical.  The precordium is quiet.  The first heart sound is normal.  The second heart sound is physiologically split.  There is no murmur gallop rub or click.  There is no abnormal lift or heave.  The abdomen is soft and nontender.  The bowel sounds are normal.  The liver and spleen are not enlarged.  There are no abdominal masses.  There are no abdominal bruits.  Extremities reveal good pedal pulses.  There is chronic swelling and deformity around the right knee. There is no phlebitis or edema.  There is no cyanosis or clubbing.  Strength is normal and symmetrical in all extremities.  There is no lateralizing weakness.  There are no sensory deficits.  The skin is warm and dry.  There is no rash.     Assessment / Plan: Continue on same medication.  Blood work today pending.  Recheck in 3 months for followup office visit and EKG to followup on her intermittent Wolff-Parkinson-White pattern.

## 2011-12-14 NOTE — Assessment & Plan Note (Signed)
Since last visit the patient's weight is up another 12 pounds.  He attributes a lot of her weight gain to the fact that she is still having a lot of difficulty with constipation.  Her primary care provider Dr. Oliver Barre recently started her on samples of Linzess which has been effective for her but it causes her to have uncontrolled loose movements preventing her from going anywhere on the days that she takes it.  Therefore she only uses it several times a week

## 2011-12-14 NOTE — Assessment & Plan Note (Signed)
The patient remains on long-term Coumadin anticoagulation.  She has not been having any complications from the Coumadin.

## 2011-12-15 NOTE — Progress Notes (Signed)
Quick Note:  Please report to patient. The recent labs are stable. Continue same medication and careful diet. The thyroid labs are stable. ______

## 2011-12-17 ENCOUNTER — Telehealth: Payer: Self-pay | Admitting: *Deleted

## 2011-12-17 NOTE — Telephone Encounter (Signed)
No answer, mailed copy and highlighted  Dr. Yevonne Pax comments

## 2011-12-17 NOTE — Telephone Encounter (Signed)
Message copied by Burnell Blanks on Fri Dec 17, 2011  5:44 PM ------      Message from: Cassell Clement      Created: Wed Dec 15, 2011  7:22 PM       Please report to patient.  The recent labs are stable. Continue same medication and careful diet.  The thyroid labs are stable.

## 2012-01-04 ENCOUNTER — Ambulatory Visit (INDEPENDENT_AMBULATORY_CARE_PROVIDER_SITE_OTHER): Payer: BC Managed Care – PPO | Admitting: *Deleted

## 2012-01-04 DIAGNOSIS — I82409 Acute embolism and thrombosis of unspecified deep veins of unspecified lower extremity: Secondary | ICD-10-CM

## 2012-01-04 DIAGNOSIS — Z7901 Long term (current) use of anticoagulants: Secondary | ICD-10-CM

## 2012-01-04 DIAGNOSIS — I2699 Other pulmonary embolism without acute cor pulmonale: Secondary | ICD-10-CM

## 2012-01-04 LAB — POCT INR: INR: 2.8

## 2012-01-10 ENCOUNTER — Telehealth: Payer: Self-pay | Admitting: Cardiology

## 2012-01-10 NOTE — Telephone Encounter (Signed)
Advised patient no Benicar or Micardis samples

## 2012-01-10 NOTE — Telephone Encounter (Signed)
New problem:   Samples of benicar.   

## 2012-01-11 ENCOUNTER — Telehealth: Payer: Self-pay | Admitting: Internal Medicine

## 2012-01-11 ENCOUNTER — Other Ambulatory Visit: Payer: Self-pay | Admitting: Cardiology

## 2012-01-11 MED ORDER — GUANFACINE HCL 1 MG PO TABS: 1.0000 mg | ORAL_TABLET | Freq: Every day | ORAL | Status: DC

## 2012-01-11 NOTE — Telephone Encounter (Signed)
Ok for 2 wks if available

## 2012-01-11 NOTE — Telephone Encounter (Signed)
Patient needs samples of Benicar, Dr. Jenness Corner office is out of samples and told her to check with [place]

## 2012-01-12 ENCOUNTER — Other Ambulatory Visit: Payer: Self-pay

## 2012-01-12 MED ORDER — GUANFACINE HCL 1 MG PO TABS: 1.0000 mg | ORAL_TABLET | Freq: Every day | ORAL | Status: DC

## 2012-01-12 NOTE — Telephone Encounter (Signed)
Patient informed to pickup samples at the front desk .

## 2012-01-25 ENCOUNTER — Other Ambulatory Visit: Payer: Self-pay | Admitting: Cardiology

## 2012-01-25 DIAGNOSIS — R52 Pain, unspecified: Secondary | ICD-10-CM

## 2012-01-25 NOTE — Telephone Encounter (Signed)
Per patient she is not able to get from orthopedic doctor secondary to issues with her workman's comp. Will forward to  Dr. Patty Sermons for review

## 2012-01-25 NOTE — Telephone Encounter (Signed)
Pt wants refill hydrocodone at [facility] , pt number if any problem [phone]

## 2012-01-26 NOTE — Telephone Encounter (Signed)
Call me sometime to discuss.

## 2012-01-27 MED ORDER — HYDROCODONE-ACETAMINOPHEN 5-325 MG PO TABS: 1.0000 | ORAL_TABLET | Freq: Four times a day (QID) | ORAL | Status: DC | PRN

## 2012-01-27 NOTE — Telephone Encounter (Signed)
Patient states she has to get this on workman's comp from  Dr. Patty Sermons, discussed with him and ok to fill

## 2012-02-01 ENCOUNTER — Ambulatory Visit (INDEPENDENT_AMBULATORY_CARE_PROVIDER_SITE_OTHER): Payer: Worker's Compensation | Admitting: *Deleted

## 2012-02-01 ENCOUNTER — Ambulatory Visit: Payer: Self-pay | Admitting: Infectious Disease

## 2012-02-01 DIAGNOSIS — I2699 Other pulmonary embolism without acute cor pulmonale: Secondary | ICD-10-CM

## 2012-02-01 DIAGNOSIS — I82409 Acute embolism and thrombosis of unspecified deep veins of unspecified lower extremity: Secondary | ICD-10-CM

## 2012-02-01 DIAGNOSIS — Z7901 Long term (current) use of anticoagulants: Secondary | ICD-10-CM

## 2012-02-01 LAB — POCT INR: INR: 1.6

## 2012-02-09 ENCOUNTER — Ambulatory Visit: Payer: Self-pay | Admitting: Infectious Disease

## 2012-02-10 ENCOUNTER — Encounter: Payer: Self-pay | Admitting: Infectious Disease

## 2012-02-10 ENCOUNTER — Ambulatory Visit (INDEPENDENT_AMBULATORY_CARE_PROVIDER_SITE_OTHER): Payer: Worker's Compensation | Admitting: Infectious Disease

## 2012-02-10 VITALS — BP 162/101 | HR 83 | Temp 98.2°F | Ht 63.5 in | Wt 341.0 lb

## 2012-02-10 DIAGNOSIS — Z96659 Presence of unspecified artificial knee joint: Secondary | ICD-10-CM

## 2012-02-10 DIAGNOSIS — K5900 Constipation, unspecified: Secondary | ICD-10-CM

## 2012-02-10 DIAGNOSIS — A4902 Methicillin resistant Staphylococcus aureus infection, unspecified site: Secondary | ICD-10-CM

## 2012-02-10 DIAGNOSIS — I10 Essential (primary) hypertension: Secondary | ICD-10-CM

## 2012-02-10 DIAGNOSIS — T8459XA Infection and inflammatory reaction due to other internal joint prosthesis, initial encounter: Secondary | ICD-10-CM

## 2012-02-10 DIAGNOSIS — T8450XA Infection and inflammatory reaction due to unspecified internal joint prosthesis, initial encounter: Secondary | ICD-10-CM

## 2012-02-10 NOTE — Progress Notes (Signed)
Subjective:    Patient ID: Mia [name], female    DOB: 11/17/1955, 57 y.o.   MRN: [id number]  HPI  57 year old with recurrent prosthetic knee infection with MRSA sp repeat I and D in January 2012 with exchange of polyethylene and washout by Dr Ranell Patrick. She was being treated with daptomycin due to concern about a rxn to vancomycin. During her stay she developed allergic rash on zosyn and cefepime. She was on the daptomycin and developed a PICC line>N infection with e coli and klebsiella and was treated with cipro. She had been off of antibiotics for most of this year until she had trauma with swollen foot that I performed I and D on (was largely a hematoma). She has been seen by Dr. Lestine Box and Dr. Charlann Boxer for concerns of recurrence of infection in the right knee where she has loosening. Her ESR had come up above 25 A few months ago and Per the pt aspirate of her right knee showed 10K wbc in Dr. Boneta Lucks office. I took her off abx in anticipation of repeat aspiration but she did not in fact have this BUt had improvement in her knee pain and her inflammatory markers have improved. She still has pain with weight bearing for prolonged periods, difficulty standing to cook and do other activities she previously very much enjoyed due to instabilityof her knee. Dr. Charlann Boxer has been considering repeat surgery to repair TKA but pt has been understandably frightened about risk of infection AGAIn. I share some much of her concern. She is trying to go on weight loss program which would be prudent. I think ultimately if seh continues to suffer the consequences of her knee not functioning well she must weigh the suffering this causes vs promise but also RISk of another surgery including infection. Should she pursue repari of TKA I could try to help her minimize risk of new infeection by decolonization but certainly NOT ensure that new knee repllacemment would not become newly infected or that there might not be a nidus of  infection in old TKA. This site could be evaluted by repeat aspirate or in OR at time of planned joint replacement. I spent greater than 45 minutes with the patient including greater than 50% of time in face to face counsel of the patient and in coordination of their care.    Review of Systems  Constitutional: Negative for fever, chills, diaphoresis, activity change, appetite change, fatigue and unexpected weight change.  HENT: Negative for congestion, sore throat, rhinorrhea, sneezing, trouble swallowing and sinus pressure.   Eyes: Negative for photophobia and visual disturbance.  Respiratory: Negative for cough, chest tightness, shortness of breath, wheezing and stridor.   Cardiovascular: Negative for chest pain, palpitations and leg swelling.  Gastrointestinal: Positive for constipation. Negative for nausea, vomiting, abdominal pain, diarrhea, blood in stool, abdominal distention and anal bleeding.  Genitourinary: Negative for dysuria, hematuria, flank pain and difficulty urinating.  Musculoskeletal: Positive for gait problem. Negative for myalgias, back pain, joint swelling and arthralgias.  Skin: Negative for color change, pallor, rash and wound.  Neurological: Negative for dizziness, tremors, weakness and light-headedness.  Hematological: Negative for adenopathy. Does not bruise/bleed easily.  Psychiatric/Behavioral: Negative for behavioral problems, confusion, sleep disturbance, dysphoric mood, decreased concentration and agitation.       Objective:   Physical Exam  Constitutional: She is oriented to person, place, and time. She appears well-developed and well-nourished. No distress.  HENT:  Head: Normocephalic.  Eyes: Conjunctivae normal and EOM are  normal.  Neck: Normal range of motion. Neck supple.  Cardiovascular: Normal rate and regular rhythm.   Pulmonary/Chest: Effort normal. No respiratory distress.  Abdominal: She exhibits no distension.  Musculoskeletal: She exhibits no  edema and no tenderness.  Neurological: She is alert and oriented to person, place, and time. She exhibits normal muscle tone. Coordination normal.  Skin: Skin is warm and dry. She is not diaphoretic. No erythema. No pallor.  Psychiatric: She has a normal mood and affect. Her behavior is normal. Judgment and thought content normal.          Assessment & Plan:  Prosthetic joint infection: recheck esr and crp observe off antibiotics  MRSA: would decolonize again prior to any elective surgery  HTN: not controlled today but not ready to pay copay for anti-HTNsives  Constipation: still a major complaint. To take up with PCP

## 2012-02-11 LAB — C-REACTIVE PROTEIN: CRP: 1.2 mg/dL — ABNORMAL HIGH (ref <0.60)

## 2012-02-11 LAB — SEDIMENTATION RATE: Sed Rate: 11 mm/hr (ref 0–22)

## 2012-02-15 ENCOUNTER — Ambulatory Visit (INDEPENDENT_AMBULATORY_CARE_PROVIDER_SITE_OTHER): Payer: Self-pay | Admitting: Internal Medicine

## 2012-02-15 ENCOUNTER — Ambulatory Visit (INDEPENDENT_AMBULATORY_CARE_PROVIDER_SITE_OTHER): Payer: Worker's Compensation

## 2012-02-15 ENCOUNTER — Encounter: Payer: Self-pay | Admitting: Internal Medicine

## 2012-02-15 VITALS — BP 128/80 | HR 81 | Temp 97.9°F | Ht 63.5 in | Wt 343.0 lb

## 2012-02-15 DIAGNOSIS — IMO0002 Reserved for concepts with insufficient information to code with codable children: Secondary | ICD-10-CM

## 2012-02-15 DIAGNOSIS — I2699 Other pulmonary embolism without acute cor pulmonale: Secondary | ICD-10-CM | POA: Diagnosis not present

## 2012-02-15 DIAGNOSIS — I82409 Acute embolism and thrombosis of unspecified deep veins of unspecified lower extremity: Secondary | ICD-10-CM

## 2012-02-15 DIAGNOSIS — J019 Acute sinusitis, unspecified: Secondary | ICD-10-CM | POA: Insufficient documentation

## 2012-02-15 DIAGNOSIS — M5416 Radiculopathy, lumbar region: Secondary | ICD-10-CM

## 2012-02-15 DIAGNOSIS — Z7901 Long term (current) use of anticoagulants: Secondary | ICD-10-CM | POA: Diagnosis not present

## 2012-02-15 DIAGNOSIS — I10 Essential (primary) hypertension: Secondary | ICD-10-CM

## 2012-02-15 MED ORDER — AMOXICILLIN 500 MG PO CAPS: 1000.0000 mg | ORAL_CAPSULE | Freq: Two times a day (BID) | ORAL | Status: DC

## 2012-02-15 NOTE — Patient Instructions (Addendum)
Please take all new medication as prescribed Please continue all other medications as before, and refills have been done if requested. Please have the pharmacy call with any other refills you may need. No lab work needed today Please keep your appointments with your specialists as you have planned - Dr Patty Sermons, and Dr Charlann Boxer Please remember to sign up for My Chart if you have not done so, as this will be important to you in the future with finding out test results, communicating by private email, and scheduling acute appointments online when needed.

## 2012-02-15 NOTE — Progress Notes (Signed)
Subjective:     Patient ID: Mia [name], female   DOB: [birth year], 57 y.o.   MRN: [id number]  HPI   Here with 2-3 days acute onset fever, facial pain, pressure, headache, general weakness and malaise, and greenish d/c, with mild ST and cough, but pt denies chest pain, wheezing, increased sob or doe, orthopnea, PND, increased LE swelling, palpitations, dizziness or syncope.  Pt denies new neurological symptoms such as new headache, or facial or extremity weakness or numbness   Pt denies polydipsia, polyuria.  Pt states overall good compliance with meds, wt overall stable but little exercise however.    Pt continues to have recurring right LBP with RLE pain without change in severity, bowel or bladder change, fever, wt loss,  worsening LE numbness/weakness, gait change or falls.  Pt not rec'd for more surgury due to hx of infection, clots and hard to adjust INR. Hard to lose wt, trying to do better diet.   Past Medical History  Diagnosis Date  . Hypertension   . Morbid obesity   . Nonsmoker   . Pulmonary embolism 2010    following right knee replacement  . DVT (deep venous thrombosis)     right upper extremitiy  . Chronic anticoagulation   . Infected prosthetic knee joint   . H/O: GI bleed   . Anemia   . IBS (irritable bowel syndrome)   . Hyperlipidemia   . WPW (Wolff-Parkinson-White syndrome)   . Anemia, unspecified 04/23/2011  . Diverticulosis   . Colon polyps may 2011  . Allergic rhinitis, cause unspecified 04/29/2011  . Migraine 04/29/2011  . Gout 04/22/2010  . GERD (gastroesophageal reflux disease) 11/10/2011   Past Surgical History  Procedure Laterality Date  . Right knee replacement  2010    followed by [facility] for infection  . Cholecystectomy    . Tubal ligation      reports that she has never smoked. She has never used smokeless tobacco. She reports that she does not drink alcohol or use illicit drugs. family history includes Colon cancer in an unspecified family member and  Coronary artery disease in an unspecified family member. Allergies  Allergen Reactions  . Amlodipine     Edema,ha  . Crestor (Rosuvastatin Calcium)     Chest pain  . Piperacillin Sod-Tazobactam So    Current Outpatient Prescriptions on File Prior to Visit  Medication Sig Dispense Refill  . colchicine 0.6 MG tablet Take 0.6 mg by mouth daily. As needed      . ezetimibe (ZETIA) 10 MG tablet Take 1 tablet (10 mg total) by mouth daily.  30 tablet  3  . furosemide (LASIX) 40 MG tablet TAKE 1 TABLET BY MOUTH DAILY AS NEEDED  90 tablet  3  . guanFACINE (TENEX) 1 MG tablet Take 1 tablet (1 mg total) by mouth at bedtime.  30 tablet  3  . HYDROcodone-acetaminophen (NORCO/VICODIN) 5-325 MG per tablet Take 1 tablet by mouth every 6 (six) hours as needed for pain.  60 tablet  1  . Linaclotide (LINZESS) 290 MCG CAPS Take 1 capsule by mouth daily.  90 capsule  3  . polyethylene glycol powder (GLYCOLAX/MIRALAX) powder TAKE 17 GRAMS BY MOUTH DAILY AS NEEDED FOR CONSTIPATION  527 g  6  . pravastatin (PRAVACHOL) 40 MG tablet TAKE 1 TABLET BY MOUTH EVERY NIGHT AT BEDTIME  30 tablet  11  . warfarin (COUMADIN) 5 MG tablet Take as directed by Anticoagulation clinic Pt takes up to 1  1/2 tablets daily  50 tablet  11   No current facility-administered medications on file prior to visit.   Review of Systems  Constitutional: Negative for unexpected weight change, or unusual diaphoresis  HENT: Negative for tinnitus.   Eyes: Negative for photophobia and visual disturbance.  Respiratory: Negative for choking and stridor.   Gastrointestinal: Negative for vomiting and blood in stool.  Genitourinary: Negative for hematuria and decreased urine volume.  Musculoskeletal: Negative for acute joint swelling Skin: Negative for color change and wound.  Neurological: Negative for tremors and numbness other than noted  Psychiatric/Behavioral: Negative for decreased concentration or  hyperactivity.       Objective:    Physical Exam BP 128/80  Pulse 81  Temp(Src) 97.9 F (36.6 C) (Oral)  Ht 5' 3.5" (1.613 m)  Wt 343 lb (155.584 kg)  BMI 59.8 kg/m2  SpO2 95% VS noted, mild ill Constitutional: Pt appears well-developed and well-nourished.  HENT: Head: NCAT.  Right Ear: External ear normal.  Left Ear: External ear normal.  Bilat tm's with mild erythema.  Max sinus areas mild tender.  Pharynx with mild erythema, no exudate Eyes: Conjunctivae and EOM are normal. Pupils are equal, round, and reactive to light.  Neck: Normal range of motion. Neck supple.  Cardiovascular: Normal rate and regular rhythm.   Pulmonary/Chest: Effort normal and breath sounds normal.  Neurological: Pt is alert. Not confused , motor intact Skin: Skin is warm. No erythema. No rash Spine nontender Psychiatric: Pt behavior is normal. Thought content normal.     Assessment:       Plan:

## 2012-02-18 NOTE — Assessment & Plan Note (Signed)
Mild to mod, for antibx course,  to f/u any worsening symptoms or concerns 

## 2012-02-18 NOTE — Assessment & Plan Note (Signed)
stable overall by history and exam, recent data reviewed with pt, and pt to continue medical treatment as before,  to f/u any worsening symptoms or concerns BP Readings from Last 3 Encounters:  02/15/12 128/80  02/10/12 162/101  12/14/11 138/116

## 2012-02-18 NOTE — Assessment & Plan Note (Signed)
stable overall by history and exam,  and pt to continue medical treatment as before,  to f/u any worsening symptoms or concerns, to f/u with Dr Olin feb 28 as planned

## 2012-02-22 ENCOUNTER — Telehealth: Payer: Self-pay | Admitting: Cardiology

## 2012-02-22 NOTE — Telephone Encounter (Signed)
New problem     Need refill with tylenol  with oxycodone. C/O headache since Saturday.

## 2012-02-22 NOTE — Telephone Encounter (Signed)
Has been using more hydrocodone secondary to headaches. Has started a new blood pressure medicine (generic) Will discuss with  Dr. Patty Sermons in am

## 2012-02-22 NOTE — Telephone Encounter (Signed)
Okay, discuss in am

## 2012-02-23 NOTE — Telephone Encounter (Signed)
Discussed patient taking her hydrocodone for headaches with  Dr. Patty Sermons and he does not want to refill early.  Advised patient

## 2012-02-25 ENCOUNTER — Telehealth: Payer: Self-pay | Admitting: Internal Medicine

## 2012-02-25 DIAGNOSIS — R52 Pain, unspecified: Secondary | ICD-10-CM

## 2012-02-25 MED ORDER — HYDROCODONE-ACETAMINOPHEN 5-325 MG PO TABS: 1.0000 | ORAL_TABLET | Freq: Four times a day (QID) | ORAL | Status: DC | PRN

## 2012-02-25 NOTE — Telephone Encounter (Signed)
Done hardcopy to robin  

## 2012-02-25 NOTE — Telephone Encounter (Signed)
Faxed hardcopy to pharmacy and informed the patient 

## 2012-02-25 NOTE — Telephone Encounter (Signed)
RN did not realize patient had made a call prior in the day regarding medication.  Patient calls to explain that she was in to see Dr. Jonny Ruiz on 2/11-she understood at that visit that he was going to give her a script for pain, but did not.  He had been working on her blood pressure and had asked he to see if the headaches she was having would improve with the control of her pressure.  Most recent BP by patient is 150/80.  She had not be told to rely on tylenol or Advil for pain control.  Pharmacist said she probably needed something stronger.  In addition to headache, hip and low back continues to hurt which Dr. Jonny Ruiz said was probably related to her total knee replacement through things off alignment.  She calls today requesting something for pain to be called in that she can take.  Denies the need for triage.  She is also keeping the appointment with the orthopedist on 2/27 for look at her back/hip concerns.  If Dr. Jonny Ruiz is welling her drug store is [facility] on [address].

## 2012-02-25 NOTE — Telephone Encounter (Signed)
The patient stated she saw Dr. Jonny Ruiz on 02/15/12 and was suppose to get pain medication but did not.  Please advise [facility]

## 2012-02-25 NOTE — Telephone Encounter (Signed)
Mia [name] left a message on triage requesting to have pain medication called in her the pain her back and hip, she is requesting this from Dr. Jonny Ruiz but in the past Dr. Patty Sermons has given her this medication  Called Mia [name] to clarify this message, no answer and no voicemail

## 2012-03-07 ENCOUNTER — Ambulatory Visit (INDEPENDENT_AMBULATORY_CARE_PROVIDER_SITE_OTHER): Payer: Worker's Compensation | Admitting: Cardiology

## 2012-03-07 ENCOUNTER — Ambulatory Visit (INDEPENDENT_AMBULATORY_CARE_PROVIDER_SITE_OTHER): Payer: Worker's Compensation

## 2012-03-07 ENCOUNTER — Encounter: Payer: Self-pay | Admitting: Cardiology

## 2012-03-07 VITALS — BP 142/90 | HR 80 | Wt 343.0 lb

## 2012-03-07 DIAGNOSIS — I82409 Acute embolism and thrombosis of unspecified deep veins of unspecified lower extremity: Secondary | ICD-10-CM

## 2012-03-07 DIAGNOSIS — Z7901 Long term (current) use of anticoagulants: Secondary | ICD-10-CM

## 2012-03-07 DIAGNOSIS — I10 Essential (primary) hypertension: Secondary | ICD-10-CM

## 2012-03-07 DIAGNOSIS — I2699 Other pulmonary embolism without acute cor pulmonale: Secondary | ICD-10-CM

## 2012-03-07 DIAGNOSIS — I119 Hypertensive heart disease without heart failure: Secondary | ICD-10-CM

## 2012-03-07 DIAGNOSIS — I82401 Acute embolism and thrombosis of unspecified deep veins of right lower extremity: Secondary | ICD-10-CM

## 2012-03-07 NOTE — Assessment & Plan Note (Signed)
The patient has had no recurrence of her deep vein thrombosis.  She has had prior pulmonary emboli.  She remains on long-term permanent Coumadin anticoagulation.

## 2012-03-07 NOTE — Assessment & Plan Note (Signed)
The patient is very discouraged about her weight.  Her weight has gone up 5 pounds in the past 3 months.  She has been using some diet shakes but has not noticed any weight loss.  She is discouraged because her cousin on the same program has lost 60 pounds.  The patient is also reporting carbohydrates such as bread and potatoes.  She has been experiencing some mild peripheral edema and fluid retention.

## 2012-03-07 NOTE — Progress Notes (Signed)
Mia [name] Date of Birth:  12-23-1955 [facility] [address] [place], [place]  [address] [phone]  Fax   [phone]  HPI: This pleasant 57 year old African American woman is seen for a three-month followup office visit. She has a history of morbid obesity and a complex past medical history which includes deep vein thrombosis and previous pulmonary emboli. She has been on long-term Coumadin. She also has essential hypertension and hypercholesterolemia. She has osteoarthritis and has had a previously infected right knee prosthesis and is followed closely by infectious disease and by her orthopedist. On a previous visit here her electrocardiogram showed a pattern of WPW although the patient has never had any symptoms from this. However a subsequent electrocardiogram on her subsequent office visit did not show any evidence of WPW. She continues to be under a lot of stress. Her Hx from worker's compensation are delayed 2 weeks and for this reason she has not been able to purchase any blood pressure medication and ran out about 2 weeks ago.   Current Outpatient Prescriptions  Medication Sig Dispense Refill  . amoxicillin (AMOXIL) 500 MG capsule Take 2 capsules (1,000 mg total) by mouth 2 (two) times daily.  40 capsule  0  . colchicine 0.6 MG tablet Take 0.6 mg by mouth daily. As needed      . ezetimibe (ZETIA) 10 MG tablet Take 1 tablet (10 mg total) by mouth daily.  30 tablet  3  . furosemide (LASIX) 40 MG tablet TAKE 1 TABLET BY MOUTH DAILY AS NEEDED  90 tablet  3  . guanFACINE (TENEX) 1 MG tablet Take 1 tablet (1 mg total) by mouth at bedtime.  30 tablet  3  . HYDROcodone-acetaminophen (NORCO/VICODIN) 5-325 MG per tablet Take 1 tablet by mouth every 6 (six) hours as needed for pain.  60 tablet  0  . Linaclotide (LINZESS) 290 MCG CAPS Take 1 capsule by mouth daily.  90 capsule  3  . polyethylene glycol powder (GLYCOLAX/MIRALAX) powder TAKE 17 GRAMS BY MOUTH  DAILY AS NEEDED FOR CONSTIPATION  527 g  6  . pravastatin (PRAVACHOL) 40 MG tablet TAKE 1 TABLET BY MOUTH EVERY NIGHT AT BEDTIME  30 tablet  11  . telmisartan (MICARDIS) 80 MG tablet Take 80 mg by mouth daily.      Marland Kitchen warfarin (COUMADIN) 5 MG tablet Take as directed by Anticoagulation clinic Pt takes up to 1 1/2 tablets daily  50 tablet  11   No current facility-administered medications for this visit.    Allergies  Allergen Reactions  . Amlodipine     Edema,ha  . Crestor (Rosuvastatin Calcium)     Chest pain  . Piperacillin Sod-Tazobactam So     Patient Active Problem List  Diagnosis  . METHICILLIN RESISTANT STAPHYLOCOCCUS AUREUS INFECTION  . KLEBSIELLA PNEUMONIAE INFECTION  . Bacteremia  . PROSTHETIC JOINT COMPLICATION  . DVT (deep venous thrombosis)  . Hair loss  . Gout  . Leg swelling  . HTN (hypertension)  . Chronic anticoagulation  . Other pulmonary embolism and infarction  . Fall  . Edema of foot  . Infection of prosthetic knee joint  . Dyslipidemia  . Wolff-Parkinson-White (WPW) syndrome  . Preventative health care  . IBS (irritable bowel syndrome)  . Anemia, unspecified  . Diverticulosis  . Colon polyps  . Allergic rhinitis, cause unspecified  . Migraine  . Morbid obesity  . Constipation  . GERD (gastroesophageal reflux disease)  . Acute sinus infection  .  Right lumbar radiculopathy    History  Smoking status  . Never Smoker   Smokeless tobacco  . Never Used    History  Alcohol Use No    Family History  Problem Relation Age of Onset  . Colon cancer    . Coronary artery disease      Review of Systems: The patient denies any heat or cold intolerance.  No weight gain or weight loss.  The patient denies headaches or blurry vision.  There is no cough or sputum production.  The patient denies dizziness.  There is no hematuria or hematochezia.  The patient denies any muscle aches or arthritis.  The patient denies any rash.  The patient denies  frequent falling or instability.  There is no history of depression or anxiety.  All other systems were reviewed and are negative.   Physical Exam: Filed Vitals:   03/07/12 1038  BP: 142/90  Pulse: 80   the general appearance reveals a very large woman in no distress.The head and neck exam reveals pupils equal and reactive.  Extraocular movements are full.  There is no scleral icterus.  The mouth and pharynx are normal.  The neck is supple.  The carotids reveal no bruits.  The jugular venous pressure is normal.  The  thyroid is not enlarged.  There is no lymphadenopathy.  The chest is clear to percussion and auscultation.  There are no rales or rhonchi.  Expansion of the chest is symmetrical.  The precordium is quiet.  The first heart sound is normal.  The second heart sound is physiologically split.  There is no murmur gallop rub or click.  There is no abnormal lift or heave.  The abdomen is soft and nontender.  The bowel sounds are normal.  The liver and spleen are not enlarged.  There are no abdominal masses.  There are no abdominal bruits.  Extremities reveal good pedal pulses.  There is mild pretibial or ankle edema.  There is no cyanosis or clubbing.  Strength is normal and symmetrical in all extremities.  There is no lateralizing weakness.  There are no sensory deficits.  The skin is warm and dry.  There is no rash.      Assessment / Plan: Continue same medication.  Continue to work hard on efforts to lose weight.  Recheck 3 months

## 2012-03-07 NOTE — Patient Instructions (Addendum)
Your physician recommends that you schedule a follow-up appointment in: 3 MONTHS WITH DR Patty Sermons

## 2012-03-07 NOTE — Assessment & Plan Note (Signed)
Blood pressure has been borderline elevated since last visit.  She remains very upset about her weight and has also been upset with problems with collecting her disability checks and running out of medicine.

## 2012-03-15 ENCOUNTER — Ambulatory Visit: Payer: Self-pay | Admitting: Cardiology

## 2012-04-04 ENCOUNTER — Ambulatory Visit (INDEPENDENT_AMBULATORY_CARE_PROVIDER_SITE_OTHER): Payer: BC Managed Care – PPO | Admitting: *Deleted

## 2012-04-04 DIAGNOSIS — I82409 Acute embolism and thrombosis of unspecified deep veins of unspecified lower extremity: Secondary | ICD-10-CM

## 2012-04-04 DIAGNOSIS — I2699 Other pulmonary embolism without acute cor pulmonale: Secondary | ICD-10-CM

## 2012-04-04 DIAGNOSIS — Z7901 Long term (current) use of anticoagulants: Secondary | ICD-10-CM

## 2012-04-10 ENCOUNTER — Other Ambulatory Visit: Payer: Self-pay | Admitting: *Deleted

## 2012-04-10 MED ORDER — WARFARIN SODIUM 5 MG PO TABS: ORAL_TABLET | ORAL | Status: DC

## 2012-05-09 ENCOUNTER — Other Ambulatory Visit: Payer: Self-pay | Admitting: *Deleted

## 2012-05-09 MED ORDER — TELMISARTAN 80 MG PO TABS: 80.0000 mg | ORAL_TABLET | Freq: Every day | ORAL | Status: DC

## 2012-05-16 ENCOUNTER — Ambulatory Visit (INDEPENDENT_AMBULATORY_CARE_PROVIDER_SITE_OTHER): Payer: BC Managed Care – PPO | Admitting: *Deleted

## 2012-05-16 DIAGNOSIS — Z7901 Long term (current) use of anticoagulants: Secondary | ICD-10-CM

## 2012-05-16 DIAGNOSIS — I82409 Acute embolism and thrombosis of unspecified deep veins of unspecified lower extremity: Secondary | ICD-10-CM

## 2012-05-16 DIAGNOSIS — I2699 Other pulmonary embolism without acute cor pulmonale: Secondary | ICD-10-CM

## 2012-05-16 LAB — POCT INR: INR: 3

## 2012-06-08 ENCOUNTER — Encounter: Payer: Self-pay | Admitting: Cardiology

## 2012-06-08 ENCOUNTER — Ambulatory Visit (INDEPENDENT_AMBULATORY_CARE_PROVIDER_SITE_OTHER): Payer: BC Managed Care – PPO | Admitting: Cardiology

## 2012-06-08 ENCOUNTER — Ambulatory Visit (INDEPENDENT_AMBULATORY_CARE_PROVIDER_SITE_OTHER): Payer: BC Managed Care – PPO | Admitting: *Deleted

## 2012-06-08 VITALS — BP 136/74 | HR 83 | Ht 63.5 in | Wt 348.8 lb

## 2012-06-08 DIAGNOSIS — Z7901 Long term (current) use of anticoagulants: Secondary | ICD-10-CM

## 2012-06-08 DIAGNOSIS — I2699 Other pulmonary embolism without acute cor pulmonale: Secondary | ICD-10-CM

## 2012-06-08 DIAGNOSIS — I456 Pre-excitation syndrome: Secondary | ICD-10-CM

## 2012-06-08 DIAGNOSIS — I119 Hypertensive heart disease without heart failure: Secondary | ICD-10-CM

## 2012-06-08 DIAGNOSIS — I10 Essential (primary) hypertension: Secondary | ICD-10-CM

## 2012-06-08 DIAGNOSIS — I82409 Acute embolism and thrombosis of unspecified deep veins of unspecified lower extremity: Secondary | ICD-10-CM

## 2012-06-08 DIAGNOSIS — K5900 Constipation, unspecified: Secondary | ICD-10-CM

## 2012-06-08 LAB — POCT INR: INR: 2.6

## 2012-06-08 NOTE — Assessment & Plan Note (Signed)
Blood pressure was remaining stable on current therapy.  Unfortunately her weight continues to go up.  She is not able to exercise because of her chronic right knee pain.  She also has heat intolerance and the hot weather bothers her if she is outdoors.

## 2012-06-08 NOTE — Progress Notes (Signed)
Mia [name] Date of Birth:  04-18-1955 [facility] [address] [place], [place]  [address] [phone]         Fax   [phone]  History of Present Illness: This pleasant 57 year old [place] American woman is seen for a three-month followup office visit. She has a history of morbid obesity and a complex past medical history which includes deep vein thrombosis and previous pulmonary emboli. She has been on long-term Coumadin. She also has essential hypertension and hypercholesterolemia. She has osteoarthritis and has had a previously infected right knee prosthesis and is followed closely by infectious disease and by her orthopedist. On a previous visit here her electrocardiogram showed a pattern of WPW although the patient has never had any symptoms from this. However a subsequent electrocardiogram on her subsequent office visit did not show any evidence of WPW. She continues to be under a lot of stress.  She continues to have a lot of problems with weight gain and with constipation.   Current Outpatient Prescriptions  Medication Sig Dispense Refill  . docusate sodium (COLACE) 100 MG capsule Take 100 mg by mouth 2 (two) times daily.      Marland Kitchen ezetimibe (ZETIA) 10 MG tablet Take 1 tablet (10 mg total) by mouth daily.  30 tablet  3  . furosemide (LASIX) 40 MG tablet TAKE 1 TABLET BY MOUTH DAILY AS NEEDED  90 tablet  3  . guanFACINE (TENEX) 1 MG tablet Take 1 tablet (1 mg total) by mouth at bedtime.  30 tablet  3  . HYDROcodone-acetaminophen (NORCO/VICODIN) 5-325 MG per tablet Take 1 tablet by mouth every 6 (six) hours as needed for pain.  60 tablet  0  . Linaclotide (LINZESS) 290 MCG CAPS Take 1 capsule by mouth daily.  90 capsule  3  . polyethylene glycol powder (GLYCOLAX/MIRALAX) powder TAKE 17 GRAMS BY MOUTH DAILY AS NEEDED FOR CONSTIPATION  527 g  6  . pravastatin (PRAVACHOL) 40 MG tablet TAKE 1 TABLET BY MOUTH EVERY NIGHT AT BEDTIME  30 tablet  11  . telmisartan  (MICARDIS) 80 MG tablet Take 1 tablet (80 mg total) by mouth daily.  30 tablet  3  . warfarin (COUMADIN) 5 MG tablet Take as directed by Anticoagulation clinic  50 tablet  3  . colchicine 0.6 MG tablet Take 0.6 mg by mouth daily. As needed       No current facility-administered medications for this visit.    Allergies  Allergen Reactions  . Amlodipine     Edema,ha  . Crestor (Rosuvastatin Calcium)     Chest pain  . Piperacillin Sod-Tazobactam So     Patient Active Problem List   Diagnosis Date Noted  . DVT (deep venous thrombosis) 03/27/2010    Priority: High  . HTN (hypertension) 04/24/2010    Priority: Medium  . Gout 04/22/2010    Priority: Medium  . PROSTHETIC JOINT COMPLICATION 02/16/2010    Priority: Medium  . Leg swelling 04/22/2010    Priority: Low  . Acute sinus infection 02/15/2012  . Right lumbar radiculopathy 02/15/2012  . GERD (gastroesophageal reflux disease) 11/10/2011  . Allergic rhinitis, cause unspecified 04/29/2011  . Migraine 04/29/2011  . Morbid obesity 04/29/2011  . Constipation 04/29/2011  . Preventative health care 04/23/2011  . IBS (irritable bowel syndrome) 04/23/2011  . Anemia, unspecified 04/23/2011  . Diverticulosis   . Colon polyps   . Wolff-Parkinson-White (WPW) syndrome 04/01/2011  . Dyslipidemia 01/01/2011  . Edema of foot 10/26/2010  .  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 Fall 09/30/2010  . Other pulmonary embolism and infarction 04/24/2010  . Chronic anticoagulation   . Hair loss 04/22/2010  . METHICILLIN RESISTANT STAPHYLOCOCCUS AUREUS INFECTION 02/16/2010  . KLEBSIELLA PNEUMONIAE INFECTION 02/16/2010  . Bacteremia 02/16/2010    History  Smoking status  . Never Smoker   Smokeless tobacco  . Never Used    History  Alcohol Use No    Family History  Problem Relation Age of Onset  . Colon cancer    . Coronary artery disease      Review of Systems: Constitutional: no fever chills diaphoresis or fatigue or  change in weight.  Head and neck: no hearing loss, no epistaxis, no photophobia or visual disturbance. Respiratory: No cough, shortness of breath or wheezing. Cardiovascular: No chest pain peripheral edema, palpitations. Gastrointestinal: No abdominal distention, no abdominal pain, no change in bowel habits hematochezia or melena. Genitourinary: No dysuria, no frequency, no urgency, no nocturia. Musculoskeletal:No arthralgias, no back pain, no gait disturbance or myalgias. Neurological: No dizziness, no headaches, no numbness, no seizures, no syncope, no weakness, no tremors. Hematologic: No lymphadenopathy, no easy bruising. Psychiatric: No confusion, no hallucinations, no sleep disturbance.    Physical Exam: Filed Vitals:   06/08/12 1013  BP: 136/74  Pulse: 83   the general appearance reveals a well-developed well-nourished obese woman in no distress.The head and neck exam reveals pupils equal and reactive.  Extraocular movements are full.  There is no scleral icterus.  The mouth and pharynx are normal.  The neck is supple.  The carotids reveal no bruits.  The jugular venous pressure is normal.  The  thyroid is not enlarged.  There is no lymphadenopathy.  The chest is clear to percussion and auscultation.  There are no rales or rhonchi.  Expansion of the chest is symmetrical.  The precordium is quiet.  The first heart sound is normal.  The second heart sound is physiologically split.  There is no murmur gallop rub or click.  There is no abnormal lift or heave.  The abdomen is soft and nontender.  The bowel sounds are normal.  The liver and spleen are not enlarged.  There are no abdominal masses.  There are no abdominal bruits.  Extremities reveal good pedal pulses.  There is trace peripheral edema.  There is no cyanosis or clubbing.  Strength is normal and symmetrical in all extremities.  There is no lateralizing weakness.  There are no sensory deficits.  The skin is warm and dry.  There is no  rash.  EKG shows normal sinus rhythm with occasional PVC.  No evidence of WPW.   Assessment / Plan: Continue current therapy.  Add Colace for severe constipation.  Recheck in 3 months for followup office visit

## 2012-06-08 NOTE — Assessment & Plan Note (Signed)
The patient has not been aware of any palpitations or racing of her heart.  EKG today does not show any evidence of WPW pattern.

## 2012-06-08 NOTE — Patient Instructions (Addendum)
ADD COLACE 200 MG DAILY  Your physician recommends that you schedule a follow-up appointment in: 3 month ov

## 2012-06-08 NOTE — Assessment & Plan Note (Signed)
The patient continues to have severe constipation.  We are adding Colace 200 mg daily.  I have encouraged her to drink plenty of water.

## 2012-06-19 ENCOUNTER — Other Ambulatory Visit: Payer: Self-pay | Admitting: Cardiology

## 2012-07-20 ENCOUNTER — Ambulatory Visit (INDEPENDENT_AMBULATORY_CARE_PROVIDER_SITE_OTHER): Payer: BC Managed Care – PPO | Admitting: *Deleted

## 2012-07-20 DIAGNOSIS — Z7901 Long term (current) use of anticoagulants: Secondary | ICD-10-CM

## 2012-07-20 DIAGNOSIS — I2699 Other pulmonary embolism without acute cor pulmonale: Secondary | ICD-10-CM

## 2012-07-20 DIAGNOSIS — I82409 Acute embolism and thrombosis of unspecified deep veins of unspecified lower extremity: Secondary | ICD-10-CM

## 2012-08-17 ENCOUNTER — Ambulatory Visit (INDEPENDENT_AMBULATORY_CARE_PROVIDER_SITE_OTHER): Payer: Worker's Compensation | Admitting: *Deleted

## 2012-08-17 DIAGNOSIS — Z7901 Long term (current) use of anticoagulants: Secondary | ICD-10-CM

## 2012-08-17 DIAGNOSIS — I82409 Acute embolism and thrombosis of unspecified deep veins of unspecified lower extremity: Secondary | ICD-10-CM

## 2012-08-17 DIAGNOSIS — I2699 Other pulmonary embolism without acute cor pulmonale: Secondary | ICD-10-CM

## 2012-09-07 ENCOUNTER — Ambulatory Visit (INDEPENDENT_AMBULATORY_CARE_PROVIDER_SITE_OTHER): Payer: Worker's Compensation | Admitting: Pharmacist

## 2012-09-07 DIAGNOSIS — I82401 Acute embolism and thrombosis of unspecified deep veins of right lower extremity: Secondary | ICD-10-CM

## 2012-09-07 DIAGNOSIS — Z7901 Long term (current) use of anticoagulants: Secondary | ICD-10-CM

## 2012-09-07 DIAGNOSIS — I82409 Acute embolism and thrombosis of unspecified deep veins of unspecified lower extremity: Secondary | ICD-10-CM

## 2012-09-07 DIAGNOSIS — I2699 Other pulmonary embolism without acute cor pulmonale: Secondary | ICD-10-CM

## 2012-09-11 ENCOUNTER — Ambulatory Visit (INDEPENDENT_AMBULATORY_CARE_PROVIDER_SITE_OTHER): Payer: Worker's Compensation | Admitting: Cardiology

## 2012-09-11 VITALS — BP 168/100 | HR 83 | Ht 63.5 in | Wt 350.0 lb

## 2012-09-11 DIAGNOSIS — I456 Pre-excitation syndrome: Secondary | ICD-10-CM

## 2012-09-11 DIAGNOSIS — I10 Essential (primary) hypertension: Secondary | ICD-10-CM

## 2012-09-11 DIAGNOSIS — E7800 Pure hypercholesterolemia, unspecified: Secondary | ICD-10-CM

## 2012-09-11 DIAGNOSIS — I82409 Acute embolism and thrombosis of unspecified deep veins of unspecified lower extremity: Secondary | ICD-10-CM

## 2012-09-11 DIAGNOSIS — I82401 Acute embolism and thrombosis of unspecified deep veins of right lower extremity: Secondary | ICD-10-CM

## 2012-09-11 NOTE — Progress Notes (Signed)
Mia [name] Date of Birth:  [birth year] [facility] [address] [place], [place]  [address] [phone]         Fax   [phone]  History of Present Illness: This pleasant 57 year old [place] American woman is seen for a three-month followup office visit. She has a history of morbid obesity and a complex past medical history which includes deep vein thrombosis and previous pulmonary emboli. She has been on long-term Coumadin. She also has essential hypertension and hypercholesterolemia. She has osteoarthritis and has had a previously infected right knee prosthesis and is followed closely by infectious disease and by her orthopedist. On a previous visit here her electrocardiogram showed a pattern of WPW although the patient has never had any symptoms from this. However a subsequent electrocardiogram on her subsequent office visit did not show any evidence of WPW. She continues to be under a lot of stress. She continues to have a lot of problems with weight gain and with constipation.   Current Outpatient Prescriptions  Medication Sig Dispense Refill  . colchicine 0.6 MG tablet Take 0.6 mg by mouth daily. As needed      . docusate sodium (COLACE) 100 MG capsule Take 100 mg by mouth 2 (two) times daily.      Marland Kitchen ezetimibe (ZETIA) 10 MG tablet Take 1 tablet (10 mg total) by mouth daily.  30 tablet  3  . furosemide (LASIX) 40 MG tablet TAKE 1 TABLET BY MOUTH DAILY AS NEEDED  90 tablet  3  . guanFACINE (TENEX) 1 MG tablet TAKE 1 TABLET BY MOUTH AT BEDTIME  30 tablet  3  . HYDROcodone-acetaminophen (NORCO/VICODIN) 5-325 MG per tablet Take 1 tablet by mouth every 6 (six) hours as needed for pain.  60 tablet  0  . Linaclotide (LINZESS) 290 MCG CAPS Take 1 capsule by mouth daily.  90 capsule  3  . polyethylene glycol powder (GLYCOLAX/MIRALAX) powder TAKE 17 GRAMS BY MOUTH DAILY AS NEEDED FOR CONSTIPATION  527 g  6  . pravastatin (PRAVACHOL) 40 MG tablet TAKE 1 TABLET BY  MOUTH EVERY NIGHT AT BEDTIME  30 tablet  11  . telmisartan (MICARDIS) 80 MG tablet Take 1 tablet (80 mg total) by mouth daily.  30 tablet  3  . warfarin (COUMADIN) 5 MG tablet Take as directed by Anticoagulation clinic  50 tablet  3   No current facility-administered medications for this visit.    Allergies  Allergen Reactions  . Amlodipine     Edema,ha  . Crestor [Rosuvastatin Calcium]     Chest pain  . Piperacillin Sod-Tazobactam So     Patient Active Problem List   Diagnosis Date Noted  . DVT (deep venous thrombosis) 03/27/2010    Priority: High  . HTN (hypertension) 04/24/2010    Priority: Medium  . Gout 04/22/2010    Priority: Medium  . PROSTHETIC JOINT COMPLICATION 02/16/2010    Priority: Medium  . Leg swelling 04/22/2010    Priority: Low  . Acute sinus infection 02/15/2012  . Right lumbar radiculopathy 02/15/2012  . GERD (gastroesophageal reflux disease) 11/10/2011  . Allergic rhinitis, cause unspecified 04/29/2011  . Migraine 04/29/2011  . Morbid obesity 04/29/2011  . Constipation 04/29/2011  . Preventative health care 04/23/2011  . IBS (irritable bowel syndrome) 04/23/2011  . Anemia, unspecified 04/23/2011  . Diverticulosis   . Colon polyps   . Wolff-Parkinson-White (WPW) syndrome 04/01/2011  . Dyslipidemia 01/01/2011  . Edema of foot 10/26/2010  . Infection of prosthetic knee  joint 10/26/2010  . Fall 09/30/2010  . Other pulmonary embolism and infarction 04/24/2010  . Chronic anticoagulation   . Hair loss 04/22/2010  . METHICILLIN RESISTANT STAPHYLOCOCCUS AUREUS INFECTION 02/16/2010  . KLEBSIELLA PNEUMONIAE INFECTION 02/16/2010  . Bacteremia 02/16/2010    History  Smoking status  . Never Smoker   Smokeless tobacco  . Never Used    History  Alcohol Use No    Family History  Problem Relation Age of Onset  . Colon cancer    . Coronary artery disease      Review of Systems: Constitutional: no fever chills diaphoresis or fatigue or change in  weight.  Head and neck: no hearing loss, no epistaxis, no photophobia or visual disturbance. Respiratory: No cough, shortness of breath or wheezing. Cardiovascular: No chest pain peripheral edema, palpitations. Gastrointestinal: No abdominal distention, no abdominal pain, no change in bowel habits hematochezia or melena. Genitourinary: No dysuria, no frequency, no urgency, no nocturia. Musculoskeletal:No arthralgias, no back pain, no gait disturbance or myalgias. Neurological: No dizziness, no headaches, no numbness, no seizures, no syncope, no weakness, no tremors. Hematologic: No lymphadenopathy, no easy bruising. Psychiatric: No confusion, no hallucinations, no sleep disturbance.    Physical Exam: Filed Vitals:   09/11/12 1050  BP: 168/100  Pulse: 83   the general appearance reveals a very large woman.  Her weight is up 2 more pounds to 350lb.The head and neck exam reveals pupils equal and reactive.  Extraocular movements are full.  There is no scleral icterus.  The mouth and pharynx are normal.  The neck is supple.  The carotids reveal no bruits.  The jugular venous pressure is normal.  The  thyroid is not enlarged.  There is no lymphadenopathy.  The chest is clear to percussion and auscultation.  There are no rales or rhonchi.  Expansion of the chest is symmetrical.  The precordium is quiet.  The first heart sound is normal.  The second heart sound is physiologically split.  There is no murmur gallop rub or click.  There is no abnormal lift or heave.  The abdomen is soft and nontender.  The bowel sounds are normal.  The liver and spleen are not enlarged.  There are no abdominal masses.  There are no abdominal bruits.  Extremities reveal good pedal pulses.  There is no phlebitis or edema.  There is no cyanosis or clubbing.  Strength is normal and symmetrical in all extremities.  There is no lateralizing weakness.  There are no sensory deficits.  The skin is warm and dry.  There is no rash.  Her  right knee does not show any evidence of active infection now.  There is no erythema or heat associated with the right knee prosthesis.     Assessment / Plan: Continue on same medication.  Work harder on weight loss.  Take extra Lasix today to help with higher blood pressure today recheck in 3 months for followup office visit EKG lipid panel hepatic function panel and basal metabolic panel.

## 2012-09-11 NOTE — Assessment & Plan Note (Signed)
The patient has not been having any symptoms of tachycardia or palpitations.

## 2012-09-11 NOTE — Patient Instructions (Addendum)
Your physician recommends that you continue on your current medications as directed. Please refer to the Current Medication list given to you today.  Your physician recommends that you schedule a follow-up appointment in: 3 months with fasting labs (LP/BMET/HFP) and ekg 

## 2012-09-11 NOTE — Assessment & Plan Note (Signed)
The patient has not had any recurrent DVT symptoms.

## 2012-09-11 NOTE — Assessment & Plan Note (Signed)
The patient has applied to be accepted and a obesity weight loss program at [facility].  I have encouraged her to pursue that.  Weight loss is very important for managing her other problems.

## 2012-09-11 NOTE — Assessment & Plan Note (Addendum)
Blood pressure is higher today.  She did eat some pizza at a birthday party over the weekend.  She had some increased edema over the weekend and took extra Lasix

## 2012-09-15 ENCOUNTER — Ambulatory Visit (INDEPENDENT_AMBULATORY_CARE_PROVIDER_SITE_OTHER): Payer: BC Managed Care – PPO | Admitting: Internal Medicine

## 2012-09-15 ENCOUNTER — Ambulatory Visit (INDEPENDENT_AMBULATORY_CARE_PROVIDER_SITE_OTHER): Payer: Worker's Compensation | Admitting: Pharmacist

## 2012-09-15 ENCOUNTER — Other Ambulatory Visit: Payer: Self-pay

## 2012-09-15 ENCOUNTER — Encounter: Payer: Self-pay | Admitting: Internal Medicine

## 2012-09-15 VITALS — BP 134/90 | HR 87 | Temp 98.0°F | Ht 63.0 in | Wt 349.4 lb

## 2012-09-15 DIAGNOSIS — G43909 Migraine, unspecified, not intractable, without status migrainosus: Secondary | ICD-10-CM

## 2012-09-15 DIAGNOSIS — I2699 Other pulmonary embolism without acute cor pulmonale: Secondary | ICD-10-CM

## 2012-09-15 DIAGNOSIS — R319 Hematuria, unspecified: Secondary | ICD-10-CM

## 2012-09-15 DIAGNOSIS — I10 Essential (primary) hypertension: Secondary | ICD-10-CM

## 2012-09-15 DIAGNOSIS — IMO0002 Reserved for concepts with insufficient information to code with codable children: Secondary | ICD-10-CM

## 2012-09-15 DIAGNOSIS — N390 Urinary tract infection, site not specified: Secondary | ICD-10-CM | POA: Insufficient documentation

## 2012-09-15 DIAGNOSIS — I82409 Acute embolism and thrombosis of unspecified deep veins of unspecified lower extremity: Secondary | ICD-10-CM

## 2012-09-15 DIAGNOSIS — M5416 Radiculopathy, lumbar region: Secondary | ICD-10-CM

## 2012-09-15 DIAGNOSIS — Z7901 Long term (current) use of anticoagulants: Secondary | ICD-10-CM

## 2012-09-15 LAB — POCT URINALYSIS DIPSTICK
Bilirubin, UA: NEGATIVE
Glucose, UA: NEGATIVE
Nitrite, UA: NEGATIVE
pH, UA: 5

## 2012-09-15 MED ORDER — BUTALBITAL-APAP-CAFFEINE 50-325-40 MG PO TABS: 1.0000 | ORAL_TABLET | Freq: Two times a day (BID) | ORAL | Status: DC | PRN

## 2012-09-15 MED ORDER — SUMATRIPTAN SUCCINATE 100 MG PO TABS: 100.0000 mg | ORAL_TABLET | ORAL | Status: DC | PRN

## 2012-09-15 MED ORDER — CIPROFLOXACIN HCL 500 MG PO TABS: 500.0000 mg | ORAL_TABLET | Freq: Two times a day (BID) | ORAL | Status: DC

## 2012-09-15 MED ORDER — TRAMADOL HCL 50 MG PO TABS: 50.0000 mg | ORAL_TABLET | Freq: Four times a day (QID) | ORAL | Status: DC | PRN

## 2012-09-15 NOTE — Patient Instructions (Addendum)
Please take all new medication as prescribed - the antibiotic (cipro), but also either fioricet (generic) or imitrex (generic) for migraine Please continue all other medications as before, and refills have been done if requested. Please have the pharmacy call with any other refills you may need.  Your specimen will be sent for culture to make sure we have the correct antibiotic  Please keep your appointments with your specialists as you have planned  OK to try tramadol as needed for pain (such as the lower back and right knee)  Please return in 3 months, or sooner if needed

## 2012-09-15 NOTE — Progress Notes (Signed)
Subjective:    Patient ID: Mia [name], female    DOB: [birth year], 57 y.o.   MRN: [id number]  HPI  Here to fu with acute;  Has 3 days onset mild urinary symptoms such as dysuria, frequency and small BRB, but no urgency, flank pain, or n/v, fever, chills.Pt continues to have recurring right LBP without change in severity, bowel or bladder change, fever, wt loss,  worsening LE pain/numbness/weakness, gait change or falls.  Also has chronic right knee pain with ambulation that limits her exercise capacity, no swelling, giveaways or falls recent.  Has incidentally also had increaesd freq/severity of typical migraine with throbbinng, photophobia, 1-2 times per wk now, lasting usually 1 day, though current HA lasting 2 days.  Denies worsening depressive symptoms, suicidal ideation, or panic; has ongoing anxiety Past Medical History  Diagnosis Date  . Hypertension   . Morbid obesity   . Nonsmoker   . Pulmonary embolism 2010    following right knee replacement  . DVT (deep venous thrombosis)     right upper extremitiy  . Chronic anticoagulation   . Infected prosthetic knee joint   . H/O: GI bleed   . Anemia   . IBS (irritable bowel syndrome)   . Hyperlipidemia   . WPW (Wolff-Parkinson-White syndrome)   . Anemia, unspecified 04/23/2011  . Diverticulosis   . Colon polyps may 2011  . Allergic rhinitis, cause unspecified 04/29/2011  . Migraine 04/29/2011  . Gout 04/22/2010  . GERD (gastroesophageal reflux disease) 11/10/2011   Past Surgical History  Procedure Laterality Date  . Right knee replacement  2010    followed by [facility] & D for infection  . Cholecystectomy    . Tubal ligation      reports that she has never smoked. She has never used smokeless tobacco. She reports that she does not drink alcohol or use illicit drugs. family history includes Colon cancer in an other family member; Coronary artery disease in an other family member. Allergies  Allergen Reactions  . Amlodipine    Edema,ha  . Crestor [Rosuvastatin Calcium]     Chest pain  . Piperacillin Sod-Tazobactam So    Current Outpatient Prescriptions on File Prior to Visit  Medication Sig Dispense Refill  . colchicine 0.6 MG tablet Take 0.6 mg by mouth daily. As needed      . docusate sodium (COLACE) 100 MG capsule Take 100 mg by mouth 2 (two) times daily.      Marland Kitchen ezetimibe (ZETIA) 10 MG tablet Take 1 tablet (10 mg total) by mouth daily.  30 tablet  3  . furosemide (LASIX) 40 MG tablet TAKE 1 TABLET BY MOUTH DAILY AS NEEDED  90 tablet  3  . guanFACINE (TENEX) 1 MG tablet TAKE 1 TABLET BY MOUTH AT BEDTIME  30 tablet  3  . Linaclotide (LINZESS) 290 MCG CAPS Take 1 capsule by mouth daily.  90 capsule  3  . polyethylene glycol powder (GLYCOLAX/MIRALAX) powder TAKE 17 GRAMS BY MOUTH DAILY AS NEEDED FOR CONSTIPATION  527 g  6  . pravastatin (PRAVACHOL) 40 MG tablet TAKE 1 TABLET BY MOUTH EVERY NIGHT AT BEDTIME  30 tablet  11  . telmisartan (MICARDIS) 80 MG tablet Take 1 tablet (80 mg total) by mouth daily.  30 tablet  3  . warfarin (COUMADIN) 5 MG tablet Take as directed by Anticoagulation clinic  50 tablet  3   No current facility-administered medications on file prior to visit.   Review of Systems  Constitutional:  Negative for unexpected weight change, or unusual diaphoresis  HENT: Negative for tinnitus.   Eyes: Negative for photophobia and visual disturbance.  Respiratory: Negative for choking and stridor.   Gastrointestinal: Negative for vomiting and blood in stool.  Genitourinary: Negative for hematuria and decreased urine volume.  Musculoskeletal: Negative for acute joint swelling Skin: Negative for color change and wound.  Neurological: Negative for tremors and numbness other than noted  Psychiatric/Behavioral: Negative for decreased concentration or  hyperactivity.       Objective:   Physical Exam BP 134/90  Pulse 87  Temp(Src) 98 F (36.7 C) (Oral)  Ht 5\' 3"  (1.6 m)  Wt 349 lb 6 oz (158.475 kg)   BMI 61.9 kg/m2  SpO2 96% VS noted, mild ill Constitutional: Pt appears well-developed and well-nourished. /morbid obese HENT: Head: NCAT.  Right Ear: External ear normal.  Left Ear: External ear normal.  Eyes: Conjunctivae and EOM are normal. Pupils are equal, round, and reactive to light.  Neck: Normal range of motion. Neck supple.  Cardiovascular: Normal rate and regular rhythm.   Pulmonary/Chest: Effort normal and breath sounds normal.  Abd:  Soft, NT, non-distended, + BS , no flank tender Neurological: Pt is alert. Not confused , motor 5/5, gait favors the RLE Skin: Skin is warm. No erythema.  Psychiatric: Pt behavior is normal. Thought content normal.     Assessment & Plan:

## 2012-09-16 NOTE — Assessment & Plan Note (Signed)
For limited fioricet/imitrex, consider HA clinic if not improving

## 2012-09-16 NOTE — Assessment & Plan Note (Signed)
stable overall by history and exam, recent data reviewed with pt, and pt to continue medical treatment as before,  to f/u any worsening symptoms or concerns BP Readings from Last 3 Encounters:  09/15/12 134/90  09/11/12 168/100  06/08/12 136/74

## 2012-09-16 NOTE — Assessment & Plan Note (Signed)
stable overall by history and exam, and pt to continue medical treatment as before,  to f/u any worsening symptoms or concerns 

## 2012-09-16 NOTE — Assessment & Plan Note (Signed)
Mild to mod, for antibx course, UA dip d/w pt, for urine cx,  to f/u any worsening symptoms or concerns

## 2012-09-17 ENCOUNTER — Other Ambulatory Visit: Payer: Self-pay | Admitting: Cardiology

## 2012-09-18 LAB — URINE CULTURE: Colony Count: >=100000

## 2012-09-19 ENCOUNTER — Encounter: Payer: Self-pay | Admitting: Internal Medicine

## 2012-09-24 ENCOUNTER — Other Ambulatory Visit: Payer: Self-pay | Admitting: Cardiology

## 2012-09-28 ENCOUNTER — Other Ambulatory Visit: Payer: Self-pay | Admitting: Internal Medicine

## 2012-09-28 NOTE — Telephone Encounter (Signed)
Computer indicates already refilled

## 2012-09-29 ENCOUNTER — Ambulatory Visit (INDEPENDENT_AMBULATORY_CARE_PROVIDER_SITE_OTHER): Payer: BC Managed Care – PPO

## 2012-09-29 DIAGNOSIS — Z7901 Long term (current) use of anticoagulants: Secondary | ICD-10-CM

## 2012-09-29 DIAGNOSIS — I82409 Acute embolism and thrombosis of unspecified deep veins of unspecified lower extremity: Secondary | ICD-10-CM

## 2012-09-29 DIAGNOSIS — I2699 Other pulmonary embolism without acute cor pulmonale: Secondary | ICD-10-CM

## 2012-09-29 LAB — POCT INR: INR: 3.8

## 2012-10-13 ENCOUNTER — Ambulatory Visit (INDEPENDENT_AMBULATORY_CARE_PROVIDER_SITE_OTHER): Payer: BC Managed Care – PPO | Admitting: *Deleted

## 2012-10-13 DIAGNOSIS — I2699 Other pulmonary embolism without acute cor pulmonale: Secondary | ICD-10-CM

## 2012-10-13 DIAGNOSIS — Z7901 Long term (current) use of anticoagulants: Secondary | ICD-10-CM

## 2012-10-13 DIAGNOSIS — I82409 Acute embolism and thrombosis of unspecified deep veins of unspecified lower extremity: Secondary | ICD-10-CM

## 2012-10-13 LAB — POCT INR: INR: 1.2

## 2012-10-23 ENCOUNTER — Other Ambulatory Visit: Payer: Self-pay | Admitting: Cardiology

## 2012-10-23 ENCOUNTER — Ambulatory Visit (INDEPENDENT_AMBULATORY_CARE_PROVIDER_SITE_OTHER): Payer: BC Managed Care – PPO | Admitting: General Practice

## 2012-10-23 DIAGNOSIS — I2699 Other pulmonary embolism without acute cor pulmonale: Secondary | ICD-10-CM

## 2012-10-23 DIAGNOSIS — Z7901 Long term (current) use of anticoagulants: Secondary | ICD-10-CM

## 2012-10-23 DIAGNOSIS — I82409 Acute embolism and thrombosis of unspecified deep veins of unspecified lower extremity: Secondary | ICD-10-CM

## 2012-10-23 LAB — POCT INR: INR: 1.2

## 2012-10-30 ENCOUNTER — Ambulatory Visit (INDEPENDENT_AMBULATORY_CARE_PROVIDER_SITE_OTHER): Payer: BC Managed Care – PPO | Admitting: General Practice

## 2012-10-30 DIAGNOSIS — I2699 Other pulmonary embolism without acute cor pulmonale: Secondary | ICD-10-CM

## 2012-10-30 DIAGNOSIS — Z7901 Long term (current) use of anticoagulants: Secondary | ICD-10-CM

## 2012-10-30 DIAGNOSIS — I82409 Acute embolism and thrombosis of unspecified deep veins of unspecified lower extremity: Secondary | ICD-10-CM

## 2012-11-06 ENCOUNTER — Ambulatory Visit (INDEPENDENT_AMBULATORY_CARE_PROVIDER_SITE_OTHER): Payer: BC Managed Care – PPO | Admitting: General Practice

## 2012-11-06 DIAGNOSIS — I82409 Acute embolism and thrombosis of unspecified deep veins of unspecified lower extremity: Secondary | ICD-10-CM

## 2012-11-06 DIAGNOSIS — Z7901 Long term (current) use of anticoagulants: Secondary | ICD-10-CM | POA: Diagnosis not present

## 2012-11-06 DIAGNOSIS — I2699 Other pulmonary embolism without acute cor pulmonale: Secondary | ICD-10-CM

## 2012-11-06 LAB — POCT INR: INR: 1.5

## 2012-11-16 ENCOUNTER — Ambulatory Visit (INDEPENDENT_AMBULATORY_CARE_PROVIDER_SITE_OTHER): Payer: BC Managed Care – PPO | Admitting: Pharmacist

## 2012-11-16 DIAGNOSIS — I2699 Other pulmonary embolism without acute cor pulmonale: Secondary | ICD-10-CM

## 2012-11-16 DIAGNOSIS — Z7901 Long term (current) use of anticoagulants: Secondary | ICD-10-CM

## 2012-11-16 DIAGNOSIS — I82409 Acute embolism and thrombosis of unspecified deep veins of unspecified lower extremity: Secondary | ICD-10-CM

## 2012-11-16 LAB — POCT INR: INR: 1.6

## 2012-11-21 ENCOUNTER — Other Ambulatory Visit: Payer: Self-pay | Admitting: Cardiology

## 2012-11-23 ENCOUNTER — Other Ambulatory Visit: Payer: Self-pay | Admitting: Cardiology

## 2012-11-29 ENCOUNTER — Ambulatory Visit (INDEPENDENT_AMBULATORY_CARE_PROVIDER_SITE_OTHER): Payer: BC Managed Care – PPO | Admitting: *Deleted

## 2012-11-29 DIAGNOSIS — I82409 Acute embolism and thrombosis of unspecified deep veins of unspecified lower extremity: Secondary | ICD-10-CM

## 2012-11-29 DIAGNOSIS — Z7901 Long term (current) use of anticoagulants: Secondary | ICD-10-CM

## 2012-11-29 DIAGNOSIS — I2699 Other pulmonary embolism without acute cor pulmonale: Secondary | ICD-10-CM

## 2012-11-29 LAB — POCT INR: INR: 2

## 2012-12-11 ENCOUNTER — Other Ambulatory Visit (INDEPENDENT_AMBULATORY_CARE_PROVIDER_SITE_OTHER): Payer: BC Managed Care – PPO

## 2012-12-11 ENCOUNTER — Ambulatory Visit (INDEPENDENT_AMBULATORY_CARE_PROVIDER_SITE_OTHER): Payer: BC Managed Care – PPO | Admitting: Cardiology

## 2012-12-11 ENCOUNTER — Ambulatory Visit (INDEPENDENT_AMBULATORY_CARE_PROVIDER_SITE_OTHER): Payer: Worker's Compensation | Admitting: *Deleted

## 2012-12-11 ENCOUNTER — Encounter: Payer: Self-pay | Admitting: Cardiology

## 2012-12-11 VITALS — BP 146/98 | HR 88 | Ht 63.0 in | Wt 353.0 lb

## 2012-12-11 DIAGNOSIS — I119 Hypertensive heart disease without heart failure: Secondary | ICD-10-CM

## 2012-12-11 DIAGNOSIS — E7800 Pure hypercholesterolemia, unspecified: Secondary | ICD-10-CM

## 2012-12-11 DIAGNOSIS — I82401 Acute embolism and thrombosis of unspecified deep veins of right lower extremity: Secondary | ICD-10-CM

## 2012-12-11 DIAGNOSIS — I82409 Acute embolism and thrombosis of unspecified deep veins of unspecified lower extremity: Secondary | ICD-10-CM

## 2012-12-11 DIAGNOSIS — I2699 Other pulmonary embolism without acute cor pulmonale: Secondary | ICD-10-CM

## 2012-12-11 DIAGNOSIS — I456 Pre-excitation syndrome: Secondary | ICD-10-CM

## 2012-12-11 DIAGNOSIS — K5900 Constipation, unspecified: Secondary | ICD-10-CM

## 2012-12-11 DIAGNOSIS — Z7901 Long term (current) use of anticoagulants: Secondary | ICD-10-CM

## 2012-12-11 DIAGNOSIS — I10 Essential (primary) hypertension: Secondary | ICD-10-CM

## 2012-12-11 LAB — BASIC METABOLIC PANEL
BUN: 11 mg/dL (ref 6–23)
CO2: 31 mEq/L (ref 19–32)
Calcium: 9.2 mg/dL (ref 8.4–10.5)
Creatinine, Ser: 0.7 mg/dL (ref 0.4–1.2)
GFR: 121.6 mL/min (ref >60.00)
Glucose, Bld: 103 mg/dL — ABNORMAL HIGH (ref 70–99)

## 2012-12-11 LAB — HEPATIC FUNCTION PANEL
AST: 17 U/L (ref 0–37)
Albumin: 3.3 g/dL — ABNORMAL LOW (ref 3.5–5.2)
Total Bilirubin: 0.5 mg/dL (ref 0.3–1.2)
Total Protein: 7.3 g/dL (ref 6.0–8.3)

## 2012-12-11 LAB — LIPID PANEL
Cholesterol: 206 mg/dL — ABNORMAL HIGH (ref 0–200)
HDL: 39.1 mg/dL (ref >39.00)
Triglycerides: 118 mg/dL (ref 0.0–149.0)
VLDL: 23.6 mg/dL (ref 0.0–40.0)

## 2012-12-11 LAB — LDL CHOLESTEROL, DIRECT: Direct LDL: 148.9 mg/dL

## 2012-12-11 LAB — POCT INR: INR: 1.7

## 2012-12-11 NOTE — Progress Notes (Signed)
Quick Note:  Please report to patient. The recent labs are stable. Continue same medication and careful diet. Cholesterol still too high. Keep working on diet. ______

## 2012-12-11 NOTE — Assessment & Plan Note (Signed)
EKG today shows intermittent WPW conduction.  This is asymptomatic to the patient.  The patient has not had any episodes of SVT or atrial fibrillation.

## 2012-12-11 NOTE — Assessment & Plan Note (Signed)
Patient continues to have a lot of problems with constipation.  No hematochezia or melena.

## 2012-12-11 NOTE — Assessment & Plan Note (Signed)
Blood pressure remains mildly elevated.  Unfortunately her weight is up 3 more pounds.  She has not been able to exercise because of her painful right knee.  Her orthopedist does not want to reoperate on the right knee because of her past history of postoperative knee infection and her history of DVT.

## 2012-12-11 NOTE — Assessment & Plan Note (Signed)
The patient continues on long-term Coumadin because of past history of pulmonary embolus and DVT's.  Recent INRs have been low and adjustments have been made.

## 2012-12-11 NOTE — Progress Notes (Signed)
Mia [name] Date of Birth:  Jan 05, 1955 [address] [place], [place]  [address] [phone]         Fax   [phone]  History of Present Illness: This pleasant 57 year old [place] American woman is seen for a three-month followup office visit. She has a history of morbid obesity and a complex past medical history which includes deep vein thrombosis and previous pulmonary emboli. She has been on long-term Coumadin. She also has essential hypertension and hypercholesterolemia. She has osteoarthritis and has had a previously infected right knee prosthesis and is followed closely by infectious disease and by her orthopedist. On a previous visit here her electrocardiogram showed a pattern of WPW although the patient has never had any symptoms from this. However a subsequent electrocardiogram on her subsequent office visit did not show any evidence of WPW. She continues to be under a lot of stress.  Her younger sister was just diagnosed with pancreatic cancer. She continues to have a lot of problems with weight gain and with constipation.  She is having problems with migraine headaches.  She has had some recent hematuria and this is being evaluated by her PCP Dr. Jonny Ruiz.   Current Outpatient Prescriptions  Medication Sig Dispense Refill  . butalbital-acetaminophen-caffeine (FIORICET, ESGIC) 50-325-40 MG per tablet TAKE 1 TABLET BY MOUTH TWICE DAILY AS NEEDED FOR HEADACHE  14 tablet  2  . colchicine 0.6 MG tablet Take 0.6 mg by mouth daily. As needed      . docusate sodium (COLACE) 100 MG capsule Take 100 mg by mouth 2 (two) times daily.      Marland Kitchen ezetimibe (ZETIA) 10 MG tablet Take 1 tablet (10 mg total) by mouth daily.  30 tablet  3  . furosemide (LASIX) 40 MG tablet TAKE 1 TABLET BY MOUTH DAILY AS NEEDED  90 tablet  0  . guanFACINE (TENEX) 1 MG tablet TAKE 1 TABLET BY MOUTH AT BEDTIME  30 tablet  0  . Linaclotide (LINZESS) 290 MCG CAPS Take 1 capsule by mouth daily.  90 capsule   3  . polyethylene glycol powder (GLYCOLAX/MIRALAX) powder TAKE 17 GRAMS BY MOUTH DAILY AS NEEDED FOR CONSTIPATION  527 g  6  . pravastatin (PRAVACHOL) 40 MG tablet TAKE 1 TABLET BY MOUTH EVERY NIGHT AT BEDTIME  30 tablet  0  . SUMAtriptan (IMITREX) 100 MG tablet Take 1 tablet (100 mg total) by mouth every 2 (two) hours as needed for migraine. May repeat in 2 hours if headache persists or recurs.  10 tablet  5  . telmisartan (MICARDIS) 80 MG tablet TAKE 1 TABLET BY MOUTH DAILY  30 tablet  0  . traMADol (ULTRAM) 50 MG tablet Take 1 tablet (50 mg total) by mouth every 6 (six) hours as needed for pain.  120 tablet  2  . warfarin (COUMADIN) 5 MG tablet TAKE AS DIRECTED BY ANTICOAGULATION CLINIC  40 tablet  3   No current facility-administered medications for this visit.    Allergies  Allergen Reactions  . Amlodipine     Edema,ha  . Crestor [Rosuvastatin Calcium]     Chest pain  . Piperacillin Sod-Tazobactam So     Patient Active Problem List   Diagnosis Date Noted  . DVT (deep venous thrombosis) 03/27/2010    Priority: High  . HTN (hypertension) 04/24/2010    Priority: Medium  . Gout 04/22/2010    Priority: Medium  . PROSTHETIC JOINT COMPLICATION 02/16/2010    Priority: Medium  .  Leg swelling 04/22/2010    Priority: Low  . UTI (urinary tract infection) 09/15/2012  . Migraine, unspecified, without mention of intractable migraine without mention of status migrainosus 09/15/2012  . Acute sinus infection 02/15/2012  . Right lumbar radiculopathy 02/15/2012  . GERD (gastroesophageal reflux disease) 11/10/2011  . Allergic rhinitis, cause unspecified 04/29/2011  . Migraine 04/29/2011  . Morbid obesity 04/29/2011  . Constipation 04/29/2011  . Preventative health care 04/23/2011  . IBS (irritable bowel syndrome) 04/23/2011  . Anemia, unspecified 04/23/2011  . Diverticulosis   . Colon polyps   . Wolff-Parkinson-White (WPW) syndrome 04/01/2011  . Dyslipidemia 01/01/2011  . Edema of  foot 10/26/2010  . 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 Fall 09/30/2010  . Other pulmonary embolism and infarction 04/24/2010  . Chronic anticoagulation   . Hair loss 04/22/2010  . METHICILLIN RESISTANT STAPHYLOCOCCUS AUREUS INFECTION 02/16/2010  . KLEBSIELLA PNEUMONIAE INFECTION 02/16/2010  . Bacteremia 02/16/2010    History  Smoking status  . Never Smoker   Smokeless tobacco  . Never Used    History  Alcohol Use No    Family History  Problem Relation Age of Onset  . Colon cancer    . Coronary artery disease      Review of Systems: Constitutional: no fever chills diaphoresis or fatigue or change in weight.  Head and neck: no hearing loss, no epistaxis, no photophobia or visual disturbance. Respiratory: No cough, shortness of breath or wheezing. Cardiovascular: No chest pain peripheral edema, palpitations. Gastrointestinal: No abdominal distention, no abdominal pain, no change in bowel habits hematochezia or melena. Genitourinary: No dysuria, no frequency, no urgency, no nocturia. Musculoskeletal:No arthralgias, no back pain, no gait disturbance or myalgias. Neurological: No dizziness, no headaches, no numbness, no seizures, no syncope, no weakness, no tremors. Hematologic: No lymphadenopathy, no easy bruising. Psychiatric: No confusion, no hallucinations, no sleep disturbance.    Physical Exam: Filed Vitals:   12/11/12 0936  BP: 146/98  Pulse: 88   the general appearance reveals a very large woman.  Her weight is up 2 more pounds to 350lb.The head and neck exam reveals pupils equal and reactive.  Extraocular movements are full.  There is no scleral icterus.  The mouth and pharynx are normal.  The neck is supple.  The carotids reveal no bruits.  The jugular venous pressure is normal.  The  thyroid is not enlarged.  There is no lymphadenopathy.  The chest is clear to percussion and auscultation.  There are no rales or rhonchi.  Expansion of the chest is  symmetrical.  The precordium is quiet.  The first heart sound is normal.  The second heart sound is physiologically split.  There is no murmur gallop rub or click.  There is no abnormal lift or heave.  The abdomen is soft and nontender.  The bowel sounds are normal.  The liver and spleen are not enlarged.  There are no abdominal masses.  There are no abdominal bruits.  Extremities reveal good pedal pulses.  There is no phlebitis or edema.  There is no cyanosis or clubbing.  Strength is normal and symmetrical in all extremities.  There is no lateralizing weakness.  There are no sensory deficits.  The skin is warm and dry.  There is no rash.  Her right knee does not show any evidence of active infection now.  There is no erythema or heat associated with the right knee prosthesis.  EKG today shows normal sinus rhythm with intermittent WPW conduction.  This is new since prior tracing of 06/18/12   Assessment / Plan: Continue on same medication.  Try to work harder on weight loss which will also help her back pain in her right knee pain.  Recheck here in 3 months for office visit.

## 2012-12-11 NOTE — Patient Instructions (Signed)
Your physician recommends that you continue on your current medications as directed. Please refer to the Current Medication list given to you today.  Your physician recommends that you schedule a follow-up appointment in: 3 month ov 

## 2012-12-12 ENCOUNTER — Telehealth: Payer: Self-pay | Admitting: *Deleted

## 2012-12-12 NOTE — Telephone Encounter (Signed)
Message copied by Burnell Blanks on Tue Dec 12, 2012  5:09 PM ------      Message from: Cassell Clement      Created: Mon Dec 11, 2012  9:13 PM       Please report to patient.  The recent labs are stable. Continue same medication and careful diet. Cholesterol still too high. Keep working on diet. ------

## 2012-12-12 NOTE — Telephone Encounter (Signed)
Advised patient of lab results  

## 2012-12-15 ENCOUNTER — Ambulatory Visit (INDEPENDENT_AMBULATORY_CARE_PROVIDER_SITE_OTHER): Payer: BC Managed Care – PPO | Admitting: Internal Medicine

## 2012-12-15 ENCOUNTER — Encounter: Payer: Self-pay | Admitting: Internal Medicine

## 2012-12-15 ENCOUNTER — Other Ambulatory Visit: Payer: Self-pay | Admitting: Internal Medicine

## 2012-12-15 ENCOUNTER — Other Ambulatory Visit (INDEPENDENT_AMBULATORY_CARE_PROVIDER_SITE_OTHER): Payer: BC Managed Care – PPO

## 2012-12-15 VITALS — BP 138/94 | HR 73 | Temp 98.7°F | Wt 349.8 lb

## 2012-12-15 DIAGNOSIS — R319 Hematuria, unspecified: Secondary | ICD-10-CM

## 2012-12-15 DIAGNOSIS — Z0000 Encounter for general adult medical examination without abnormal findings: Secondary | ICD-10-CM

## 2012-12-15 DIAGNOSIS — N390 Urinary tract infection, site not specified: Secondary | ICD-10-CM

## 2012-12-15 DIAGNOSIS — K219 Gastro-esophageal reflux disease without esophagitis: Secondary | ICD-10-CM

## 2012-12-15 DIAGNOSIS — M545 Low back pain: Secondary | ICD-10-CM

## 2012-12-15 DIAGNOSIS — E785 Hyperlipidemia, unspecified: Secondary | ICD-10-CM

## 2012-12-15 LAB — URINALYSIS, ROUTINE W REFLEX MICROSCOPIC
Bilirubin Urine: NEGATIVE
Hgb urine dipstick: NEGATIVE
Ketones, ur: NEGATIVE
Nitrite: NEGATIVE
Specific Gravity, Urine: 1.02 (ref 1.000–1.030)
Urobilinogen, UA: 0.2 (ref 0.0–1.0)
pH: 7 (ref 5.0–8.0)

## 2012-12-15 MED ORDER — ATORVASTATIN CALCIUM 20 MG PO TABS: 20.0000 mg | ORAL_TABLET | Freq: Every day | ORAL | Status: DC

## 2012-12-15 MED ORDER — GUANFACINE HCL 1 MG PO TABS: ORAL_TABLET | ORAL | Status: DC

## 2012-12-15 MED ORDER — PANTOPRAZOLE SODIUM 40 MG PO TBEC: 40.0000 mg | DELAYED_RELEASE_TABLET | Freq: Every day | ORAL | Status: DC

## 2012-12-15 MED ORDER — CIPROFLOXACIN HCL 500 MG PO TABS: 500.0000 mg | ORAL_TABLET | Freq: Two times a day (BID) | ORAL | Status: DC

## 2012-12-15 MED ORDER — TELMISARTAN 80 MG PO TABS: ORAL_TABLET | ORAL | Status: DC

## 2012-12-15 NOTE — Progress Notes (Signed)
Pre-visit discussion using our clinic review tool. No additional management support is needed unless otherwise documented below in the visit note.  

## 2012-12-15 NOTE — Patient Instructions (Addendum)
Please take all new medication as prescribed Please continue all other medications as before, and refills have been done if requested. Please have the pharmacy call with any other refills you may need. Please continue your efforts at being more active, low cholesterol diet, and weight control. You are otherwise up to date with prevention measures today. Please remember to followup with your GYN for the yearly pap smear and/or mammogram  Please remember to sign up for My Chart if you have not done so, as this will be important to you in the future with finding out test results, communicating by private email, and scheduling acute appointments online when needed.  You will be contacted regarding the referral for: Dr Charlann Boxer  Please return in 6 months, or sooner if needed (the office will call)

## 2012-12-15 NOTE — Assessment & Plan Note (Signed)
For protonix 40 qd ?

## 2012-12-15 NOTE — Assessment & Plan Note (Signed)

## 2012-12-15 NOTE — Assessment & Plan Note (Signed)
With recent hematuria, for UA today, for antibx if c/w

## 2012-12-15 NOTE — Assessment & Plan Note (Signed)
For referral ortho, dr Charlann Boxer, consider pain management referral

## 2012-12-15 NOTE — Assessment & Plan Note (Signed)
Uncontrolled, to change pravachol to lipitor 20

## 2012-12-15 NOTE — Progress Notes (Signed)
Subjective:    Patient ID: Mia [name], female    DOB: 1955-11-20, 57 y.o.   MRN: [id number]  HPI Here for wellness and f/u;  Overall doing ok;  Pt denies CP, worsening SOB, DOE, wheezing, orthopnea, PND, worsening LE edema, palpitations, dizziness or syncope.  Pt denies neurological change such as new headache, facial or extremity weakness.  Pt denies polydipsia, polyuria, or low sugar symptoms. Pt states overall good compliance with treatment and medications, good tolerability, and has been trying to follow lower cholesterol diet.  Pt denies worsening depressive symptoms, suicidal ideation or panic. No fever, night sweats, wt loss, loss of appetite, or other constitutional symptoms.  Pt states good ability with ADL's, has low fall risk, home safety reviewed and adequate, no other significant changes in hearing or vision, and only occasionally active with exercise.  Also has episode hematuria gross 1 wk ago ? Related to worsening LBP, Has chronic LBP, has seen Dr Charlann Boxer for right knee in past.  Also Has had worsening mild reflux, but no other abd pain, dysphagia, n/v, bowel change or blood. Liekly not gastric bypass candidate due to hx of clotting. Past Medical History  Diagnosis Date  . Hypertension   . Morbid obesity   . Nonsmoker   . Pulmonary embolism 2010    following right knee replacement  . DVT (deep venous thrombosis)     right upper extremitiy  . Chronic anticoagulation   . Infected prosthetic knee joint   . H/O: GI bleed   . Anemia   . IBS (irritable bowel syndrome)   . Hyperlipidemia   . WPW (Wolff-Parkinson-White syndrome)   . Anemia, unspecified 04/23/2011  . Diverticulosis   . Colon polyps may 2011  . Allergic rhinitis, cause unspecified 04/29/2011  . Migraine 04/29/2011  . Gout 04/22/2010  . GERD (gastroesophageal reflux disease) 11/10/2011   Past Surgical History  Procedure Laterality Date  . Right knee replacement  2010    followed by I & D for infection  .  Cholecystectomy    . Tubal ligation      reports that she has never smoked. She has never used smokeless tobacco. She reports that she does not drink alcohol or use illicit drugs. family history includes Colon cancer in an other family member; Coronary artery disease in an other family member. Allergies  Allergen Reactions  . Amlodipine     Edema,ha  . Crestor [Rosuvastatin Calcium]     Chest pain  . Piperacillin Sod-Tazobactam So   . Ultram [Tramadol]     nausea   Current Outpatient Prescriptions on File Prior to Visit  Medication Sig Dispense Refill  . butalbital-acetaminophen-caffeine (FIORICET, ESGIC) 50-325-40 MG per tablet TAKE 1 TABLET BY MOUTH TWICE DAILY AS NEEDED FOR HEADACHE  14 tablet  2  . colchicine 0.6 MG tablet Take 0.6 mg by mouth daily. As needed      . docusate sodium (COLACE) 100 MG capsule Take 100 mg by mouth 2 (two) times daily.      Marland Kitchen ezetimibe (ZETIA) 10 MG tablet Take 1 tablet (10 mg total) by mouth daily.  30 tablet  3  . furosemide (LASIX) 40 MG tablet TAKE 1 TABLET BY MOUTH DAILY AS NEEDED  90 tablet  0  . Linaclotide (LINZESS) 290 MCG CAPS Take 1 capsule by mouth daily.  90 capsule  3  . polyethylene glycol powder (GLYCOLAX/MIRALAX) powder TAKE 17 GRAMS BY MOUTH DAILY AS NEEDED FOR CONSTIPATION  527 g  6  .  SUMAtriptan (IMITREX) 100 MG tablet Take 1 tablet (100 mg total) by mouth every 2 (two) hours as needed for migraine. May repeat in 2 hours if headache persists or recurs.  10 tablet  5  . traMADol (ULTRAM) 50 MG tablet Take 1 tablet (50 mg total) by mouth every 6 (six) hours as needed for pain.  120 tablet  2  . warfarin (COUMADIN) 5 MG tablet TAKE AS DIRECTED BY ANTICOAGULATION CLINIC  40 tablet  3   No current facility-administered medications on file prior to visit.   Review of Systems Constitutional: Negative for diaphoresis, activity change, appetite change or unexpected weight change.  HENT: Negative for hearing loss, ear pain, facial swelling,  mouth sores and neck stiffness.   Eyes: Negative for pain, redness and visual disturbance.  Respiratory: Negative for shortness of breath and wheezing.   Cardiovascular: Negative for chest pain and palpitations.  Gastrointestinal: Negative for diarrhea, blood in stool, abdominal distention or other pain Genitourinary: Negative for hematuria, flank pain or change in urine volume.  Musculoskeletal: Negative for myalgias and joint swelling.  Skin: Negative for color change and wound.  Neurological: Negative for syncope and numbness. other than noted Hematological: Negative for adenopathy.  Psychiatric/Behavioral: Negative for hallucinations, self-injury, decreased concentration and agitation.      Objective:   Physical Exam BP 138/94  Pulse 73  Temp(Src) 98.7 F (37.1 C) (Oral)  Wt 349 lb 12.8 oz (158.668 kg)  SpO2 95% VS noted, morbid obese Constitutional: Pt is oriented to person, place, and time. Appears well-developed and well-nourished.  Head: Normocephalic and atraumatic.  Right Ear: External ear normal.  Left Ear: External ear normal.  Nose: Nose normal.  Mouth/Throat: Oropharynx is clear and moist.  Eyes: Conjunctivae and EOM are normal. Pupils are equal, round, and reactive to light.  Neck: Normal range of motion. Neck supple. No JVD present. No tracheal deviation present.  Cardiovascular: Normal rate, regular rhythm, normal heart sounds and intact distal pulses.   Pulmonary/Chest: Effort normal and breath sounds normal.  Abdominal: Soft. Bowel sounds are normal. There is no tenderness. No HSM  Musculoskeletal: Normal range of motion. Exhibits trace edema bilat ankles.  Lymphadenopathy:  Has no cervical adenopathy.  Neurological: Pt is alert and oriented to person, place, and time. Pt has normal reflexes. No cranial nerve deficit, Skin: Skin is warm and dry. No rash noted.  Psychiatric:  Has  normal mood and affect. Behavior is normal. except 1-2+ nervous          Assessment & Plan:

## 2012-12-19 ENCOUNTER — Telehealth: Payer: Self-pay | Admitting: Internal Medicine

## 2012-12-19 MED ORDER — LIDOCAINE 5 % EX PTCH: 1.0000 | MEDICATED_PATCH | CUTANEOUS | Status: DC

## 2012-12-19 NOTE — Telephone Encounter (Signed)
Patient informed. 

## 2012-12-19 NOTE — Telephone Encounter (Signed)
Message copied by Corwin Levins on Tue Dec 19, 2012  1:10 PM ------      Message from: Scharlene Gloss B      Created: Tue Dec 19, 2012  9:14 AM       The patient would like something for her back pain.  States she cannot take tramadol as causes stomach upset.   ------

## 2012-12-19 NOTE — Telephone Encounter (Signed)
unfort the only thing left not habit forming is lidoderm patch, hopefully this will help - done erx

## 2012-12-21 ENCOUNTER — Telehealth: Payer: Self-pay | Admitting: *Deleted

## 2012-12-21 NOTE — Telephone Encounter (Signed)
[facility] fax states patient needs PA for warfarin, called [phone], this patient evidently is under workers comp, rep states she has to Careers adviser for approval and they will contact [facility].

## 2012-12-25 ENCOUNTER — Ambulatory Visit (INDEPENDENT_AMBULATORY_CARE_PROVIDER_SITE_OTHER): Payer: Worker's Compensation

## 2012-12-25 DIAGNOSIS — I82409 Acute embolism and thrombosis of unspecified deep veins of unspecified lower extremity: Secondary | ICD-10-CM | POA: Diagnosis not present

## 2012-12-25 DIAGNOSIS — I2699 Other pulmonary embolism without acute cor pulmonale: Secondary | ICD-10-CM | POA: Diagnosis not present

## 2012-12-25 DIAGNOSIS — Z7901 Long term (current) use of anticoagulants: Secondary | ICD-10-CM | POA: Diagnosis not present

## 2012-12-25 LAB — POCT INR: INR: 2.4

## 2013-01-15 ENCOUNTER — Ambulatory Visit (INDEPENDENT_AMBULATORY_CARE_PROVIDER_SITE_OTHER): Payer: Worker's Compensation | Admitting: Pharmacist

## 2013-01-15 DIAGNOSIS — I2699 Other pulmonary embolism without acute cor pulmonale: Secondary | ICD-10-CM

## 2013-01-15 DIAGNOSIS — I82409 Acute embolism and thrombosis of unspecified deep veins of unspecified lower extremity: Secondary | ICD-10-CM

## 2013-01-15 DIAGNOSIS — Z7901 Long term (current) use of anticoagulants: Secondary | ICD-10-CM

## 2013-01-15 LAB — POCT INR: INR: 2.2

## 2013-01-21 ENCOUNTER — Other Ambulatory Visit: Payer: Self-pay | Admitting: Internal Medicine

## 2013-01-21 ENCOUNTER — Other Ambulatory Visit: Payer: Self-pay | Admitting: Cardiology

## 2013-01-22 NOTE — Telephone Encounter (Signed)
Done hardcopy to robin  

## 2013-01-23 NOTE — Telephone Encounter (Signed)
Faxed hardcopy to [facility]. 

## 2013-02-12 ENCOUNTER — Telehealth: Payer: Self-pay | Admitting: *Deleted

## 2013-02-12 ENCOUNTER — Ambulatory Visit (INDEPENDENT_AMBULATORY_CARE_PROVIDER_SITE_OTHER): Payer: Self-pay | Admitting: *Deleted

## 2013-02-12 DIAGNOSIS — I82409 Acute embolism and thrombosis of unspecified deep veins of unspecified lower extremity: Secondary | ICD-10-CM

## 2013-02-12 DIAGNOSIS — Z5181 Encounter for therapeutic drug level monitoring: Secondary | ICD-10-CM | POA: Insufficient documentation

## 2013-02-12 DIAGNOSIS — I2699 Other pulmonary embolism without acute cor pulmonale: Secondary | ICD-10-CM

## 2013-02-12 DIAGNOSIS — Z7901 Long term (current) use of anticoagulants: Secondary | ICD-10-CM

## 2013-02-12 LAB — POCT INR: INR: 5.1

## 2013-02-12 NOTE — Telephone Encounter (Signed)
Patient seen in [facility] Clinic today and spoke with Lamonte RicherSharon RN. Per Jasmine DecemberSharon patients PCP d/c her Pravachol and Zetia and Rx'd Lipitor. She had previously taken Crestor and caused chest pain. She is having again, will see for ov. Patient offered appointment tomorrow but preferred Wednesday

## 2013-02-14 ENCOUNTER — Ambulatory Visit: Payer: Self-pay | Admitting: Cardiology

## 2013-02-21 ENCOUNTER — Ambulatory Visit (INDEPENDENT_AMBULATORY_CARE_PROVIDER_SITE_OTHER): Payer: Worker's Compensation

## 2013-02-21 DIAGNOSIS — Z5181 Encounter for therapeutic drug level monitoring: Secondary | ICD-10-CM

## 2013-02-21 DIAGNOSIS — I82409 Acute embolism and thrombosis of unspecified deep veins of unspecified lower extremity: Secondary | ICD-10-CM

## 2013-02-21 DIAGNOSIS — Z7901 Long term (current) use of anticoagulants: Secondary | ICD-10-CM

## 2013-02-21 DIAGNOSIS — I2699 Other pulmonary embolism without acute cor pulmonale: Secondary | ICD-10-CM

## 2013-02-21 LAB — POCT INR: INR: 1.4

## 2013-02-28 ENCOUNTER — Ambulatory Visit (INDEPENDENT_AMBULATORY_CARE_PROVIDER_SITE_OTHER): Payer: BC Managed Care – PPO | Admitting: Pharmacist

## 2013-02-28 DIAGNOSIS — I82409 Acute embolism and thrombosis of unspecified deep veins of unspecified lower extremity: Secondary | ICD-10-CM

## 2013-02-28 DIAGNOSIS — I2699 Other pulmonary embolism without acute cor pulmonale: Secondary | ICD-10-CM

## 2013-02-28 DIAGNOSIS — Z7901 Long term (current) use of anticoagulants: Secondary | ICD-10-CM

## 2013-02-28 DIAGNOSIS — Z5181 Encounter for therapeutic drug level monitoring: Secondary | ICD-10-CM

## 2013-02-28 LAB — POCT INR: INR: 2

## 2013-03-14 ENCOUNTER — Ambulatory Visit (INDEPENDENT_AMBULATORY_CARE_PROVIDER_SITE_OTHER): Payer: Worker's Compensation | Admitting: Cardiology

## 2013-03-14 ENCOUNTER — Encounter: Payer: Self-pay | Admitting: Cardiology

## 2013-03-14 ENCOUNTER — Ambulatory Visit (INDEPENDENT_AMBULATORY_CARE_PROVIDER_SITE_OTHER): Payer: Worker's Compensation | Admitting: Pharmacist

## 2013-03-14 VITALS — BP 150/100 | HR 84 | Ht 63.0 in | Wt 350.4 lb

## 2013-03-14 DIAGNOSIS — Z7901 Long term (current) use of anticoagulants: Secondary | ICD-10-CM | POA: Diagnosis not present

## 2013-03-14 DIAGNOSIS — I456 Pre-excitation syndrome: Secondary | ICD-10-CM

## 2013-03-14 DIAGNOSIS — I2699 Other pulmonary embolism without acute cor pulmonale: Secondary | ICD-10-CM

## 2013-03-14 DIAGNOSIS — I82409 Acute embolism and thrombosis of unspecified deep veins of unspecified lower extremity: Secondary | ICD-10-CM | POA: Diagnosis not present

## 2013-03-14 DIAGNOSIS — K5900 Constipation, unspecified: Secondary | ICD-10-CM

## 2013-03-14 DIAGNOSIS — I119 Hypertensive heart disease without heart failure: Secondary | ICD-10-CM

## 2013-03-14 DIAGNOSIS — Z5181 Encounter for therapeutic drug level monitoring: Secondary | ICD-10-CM

## 2013-03-14 LAB — POCT INR: INR: 2.8

## 2013-03-14 MED ORDER — GUANFACINE HCL 1 MG PO TABS: ORAL_TABLET | ORAL | Status: DC

## 2013-03-14 MED ORDER — LINACLOTIDE 290 MCG PO CAPS: 290.0000 ug | ORAL_CAPSULE | Freq: Every day | ORAL | Status: DC

## 2013-03-14 NOTE — Patient Instructions (Signed)
Your physician recommends that you continue on your current medications as directed. Please refer to the Current Medication list given to you today.  Your physician recommends that you schedule a follow-up appointment in: 3 month ov/ekg 

## 2013-03-14 NOTE — Assessment & Plan Note (Signed)
Despite multiple medications she remains extremely constipated.  She states she has had no bowel movement for 8 days.  Despite this her weight is down 3 pounds since last visit

## 2013-03-14 NOTE — Assessment & Plan Note (Signed)
She has not had any tachycardia or palpitations

## 2013-03-14 NOTE — Progress Notes (Signed)
[name] Date of Birth:  1955-03-24 [address] [place], [place]  [address] [phone]         Fax   [phone]  History of Present Illness: This pleasant 58 year old [place] American woman is seen for a three-month followup office visit. She has a history of morbid obesity and a complex past medical history which includes deep vein thrombosis and previous pulmonary emboli. She has been on long-term Coumadin. She also has essential hypertension and hypercholesterolemia. She has osteoarthritis and has had a previously infected right knee prosthesis and is followed also by infectious disease and by her orthopedist.  Her orthopedist has told her however that he would not consider reoperation for 5 years. On a previous visit here her electrocardiogram showed a pattern of WPW although the patient has never had any symptoms from this. However a subsequent electrocardiogram on her subsequent office visit did not show any evidence of WPW. She continues to be under a lot of stress.  Her younger sister was just diagnosed with pancreatic cancer. She continues to have a lot of problems with weight gain and with constipation.  Since last visit she fell when her right knee gave out and injured her right wrist.   Current Outpatient Prescriptions  Medication Sig Dispense Refill  . atorvastatin (LIPITOR) 20 MG tablet Take 1 tablet (20 mg total) by mouth daily.  90 tablet  3  . butalbital-acetaminophen-caffeine (FIORICET, ESGIC) 50-325-40 MG per tablet TAKE 1 TABLET BY MOUTH TWICE DAILY AS NEEDED FOR HEADACHE  14 tablet  2  . colchicine 0.6 MG tablet Take 0.6 mg by mouth daily. As needed      . docusate sodium (COLACE) 100 MG capsule Take 100 mg by mouth 2 (two) times daily.      . furosemide (LASIX) 40 MG tablet TAKE 1 TABLET BY MOUTH DAILY AS NEEDED  90 tablet  0  . guanFACINE (TENEX) 1 MG tablet TAKE 1 TABLET BY MOUTH AT BEDTIME  90 tablet  3  . lidocaine (LIDODERM) 5 % Place  1 patch onto the skin daily. Remove & Discard patch within 12 hours or as directed by MD  60 patch  2  . pantoprazole (PROTONIX) 40 MG tablet Take 1 tablet (40 mg total) by mouth daily.  90 tablet  3  . polyethylene glycol powder (GLYCOLAX/MIRALAX) powder TAKE 17 GRAMS BY MOUTH DAILY AS NEEDED FOR CONSTIPATION  527 g  6  . SUMAtriptan (IMITREX) 100 MG tablet Take 1 tablet (100 mg total) by mouth every 2 (two) hours as needed for migraine. May repeat in 2 hours if headache persists or recurs.  10 tablet  5  . telmisartan (MICARDIS) 80 MG tablet TAKE 1 TABLET BY MOUTH DAILY  90 tablet  3  . traMADol (ULTRAM) 50 MG tablet Take 1 tablet (50 mg total) by mouth every 6 (six) hours as needed for pain.  120 tablet  2  . warfarin (COUMADIN) 5 MG tablet TAKE AS DIRECTED BY ANTICOAGULATION CLINIC  60 tablet  3  . Linaclotide (LINZESS) 290 MCG CAPS capsule Take 1 capsule (290 mcg total) by mouth daily.  90 capsule  3   No current facility-administered medications for this visit.    Allergies  Allergen Reactions  . Amlodipine     Edema,ha  . Crestor [Rosuvastatin Calcium]     Chest pain  . Piperacillin Sod-Tazobactam So   . Ultram [Tramadol]     nausea    Patient  Active Problem List   Diagnosis Date Noted  . DVT (deep venous thrombosis) 03/27/2010    Priority: High  . HTN (hypertension) 04/24/2010    Priority: Medium  . Gout 04/22/2010    Priority: Medium  . PROSTHETIC JOINT COMPLICATION 02/16/2010    Priority: Medium  . Leg swelling 04/22/2010    Priority: Low  . Encounter for therapeutic drug monitoring 02/12/2013  . Lower back pain 12/15/2012  . UTI (urinary tract infection) 09/15/2012  . Migraine, unspecified, without mention of intractable migraine without mention of status migrainosus 09/15/2012  . Right lumbar radiculopathy 02/15/2012  . GERD (gastroesophageal reflux disease) 11/10/2011  . Allergic rhinitis, cause unspecified 04/29/2011  . Migraine 04/29/2011  . Morbid obesity  04/29/2011  . Constipation 04/29/2011  . Preventative health care 04/23/2011  . IBS (irritable bowel syndrome) 04/23/2011  . Anemia, unspecified 04/23/2011  . Diverticulosis   . Colon polyps   . Wolff-Parkinson-White (WPW) syndrome 04/01/2011  . Dyslipidemia 01/01/2011  . Edema of foot 10/26/2010  . 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 Fall 09/30/2010  . Other pulmonary embolism and infarction 04/24/2010  . Chronic anticoagulation   . Hair loss 04/22/2010  . METHICILLIN RESISTANT STAPHYLOCOCCUS AUREUS INFECTION 02/16/2010  . KLEBSIELLA PNEUMONIAE INFECTION 02/16/2010  . Bacteremia 02/16/2010    History  Smoking status  . Never Smoker   Smokeless tobacco  . Never Used    History  Alcohol Use No    Family History  Problem Relation Age of Onset  . Colon cancer    . Coronary artery disease      Review of Systems: Constitutional: no fever chills diaphoresis or fatigue or change in weight.  Head and neck: no hearing loss, no epistaxis, no photophobia or visual disturbance. Respiratory: No cough, shortness of breath or wheezing. Cardiovascular: No chest pain peripheral edema, palpitations. Gastrointestinal: No abdominal distention, no abdominal pain, no change in bowel habits hematochezia or melena. Genitourinary: No dysuria, no frequency, no urgency, no nocturia. Musculoskeletal:No arthralgias, no back pain, no gait disturbance or myalgias. Neurological: No dizziness, no headaches, no numbness, no seizures, no syncope, no weakness, no tremors. Hematologic: No lymphadenopathy, no easy bruising. Psychiatric: No confusion, no hallucinations, no sleep disturbance.    Physical Exam: Filed Vitals:   03/14/13 1425  BP: 150/100  Pulse:    the general appearance reveals a very large woman.  Her weight is up 2 more pounds to 350lb.The head and neck exam reveals pupils equal and reactive.  Extraocular movements are full.  There is no scleral icterus.  The mouth and  pharynx are normal.  The neck is supple.  The carotids reveal no bruits.  The jugular venous pressure is normal.  The  thyroid is not enlarged.  There is no lymphadenopathy.  The chest is clear to percussion and auscultation.  There are no rales or rhonchi.  Expansion of the chest is symmetrical.  The precordium is quiet.  The first heart sound is normal.  The second heart sound is physiologically split.  There is no murmur gallop rub or click.  There is no abnormal lift or heave.  The abdomen is soft and nontender.  The bowel sounds are normal.  The liver and spleen are not enlarged.  There are no abdominal masses.  There are no abdominal bruits.  Extremities reveal good pedal pulses.  There is no phlebitis or edema.  There is no cyanosis or clubbing.  Strength is normal and symmetrical in all extremities.  There  is no lateralizing weakness.  There are no sensory deficits.  The skin is warm and dry.  There is no rash.  Her right knee does not show any evidence of active infection now.  There is no erythema or heat associated with the right knee prosthesis.     Assessment / Plan: Continue on same medication.  Try to work harder on weight loss which will also help her back pain and her right knee pain.  Recheck here in 3 months for office visit.  EKG at next visit to follow WPW prevalence

## 2013-03-14 NOTE — Assessment & Plan Note (Signed)
She remains on Coumadin for her deep vein thrombosis

## 2013-03-14 NOTE — Assessment & Plan Note (Signed)
The patient remains morbidly obese.  [facility] is [facility] but has not heard from them yet

## 2013-04-04 ENCOUNTER — Ambulatory Visit (INDEPENDENT_AMBULATORY_CARE_PROVIDER_SITE_OTHER): Payer: BC Managed Care – PPO | Admitting: *Deleted

## 2013-04-04 DIAGNOSIS — Z5181 Encounter for therapeutic drug level monitoring: Secondary | ICD-10-CM

## 2013-04-04 DIAGNOSIS — Z7901 Long term (current) use of anticoagulants: Secondary | ICD-10-CM

## 2013-04-04 DIAGNOSIS — I2699 Other pulmonary embolism without acute cor pulmonale: Secondary | ICD-10-CM

## 2013-04-04 DIAGNOSIS — I82409 Acute embolism and thrombosis of unspecified deep veins of unspecified lower extremity: Secondary | ICD-10-CM

## 2013-04-04 LAB — POCT INR: INR: 2

## 2013-04-30 ENCOUNTER — Other Ambulatory Visit: Payer: Self-pay | Admitting: Internal Medicine

## 2013-05-02 ENCOUNTER — Ambulatory Visit (INDEPENDENT_AMBULATORY_CARE_PROVIDER_SITE_OTHER): Payer: Worker's Compensation | Admitting: *Deleted

## 2013-05-02 DIAGNOSIS — Z5181 Encounter for therapeutic drug level monitoring: Secondary | ICD-10-CM | POA: Diagnosis not present

## 2013-05-02 DIAGNOSIS — I2699 Other pulmonary embolism without acute cor pulmonale: Secondary | ICD-10-CM | POA: Diagnosis not present

## 2013-05-02 DIAGNOSIS — I82409 Acute embolism and thrombosis of unspecified deep veins of unspecified lower extremity: Secondary | ICD-10-CM | POA: Diagnosis not present

## 2013-05-02 DIAGNOSIS — Z7901 Long term (current) use of anticoagulants: Secondary | ICD-10-CM

## 2013-05-02 LAB — POCT INR: INR: 1.3

## 2013-05-09 ENCOUNTER — Ambulatory Visit (INDEPENDENT_AMBULATORY_CARE_PROVIDER_SITE_OTHER): Payer: Worker's Compensation | Admitting: *Deleted

## 2013-05-09 DIAGNOSIS — Z5181 Encounter for therapeutic drug level monitoring: Secondary | ICD-10-CM

## 2013-05-09 DIAGNOSIS — Z7901 Long term (current) use of anticoagulants: Secondary | ICD-10-CM

## 2013-05-09 DIAGNOSIS — I82409 Acute embolism and thrombosis of unspecified deep veins of unspecified lower extremity: Secondary | ICD-10-CM

## 2013-05-09 DIAGNOSIS — I2699 Other pulmonary embolism without acute cor pulmonale: Secondary | ICD-10-CM

## 2013-05-09 LAB — POCT INR: INR: 2

## 2013-05-17 ENCOUNTER — Other Ambulatory Visit: Payer: Self-pay | Admitting: Internal Medicine

## 2013-05-17 NOTE — Telephone Encounter (Signed)
[facility]

## 2013-05-23 ENCOUNTER — Ambulatory Visit (INDEPENDENT_AMBULATORY_CARE_PROVIDER_SITE_OTHER): Payer: Worker's Compensation | Admitting: Surgery

## 2013-05-23 DIAGNOSIS — I2699 Other pulmonary embolism without acute cor pulmonale: Secondary | ICD-10-CM

## 2013-05-23 DIAGNOSIS — Z7901 Long term (current) use of anticoagulants: Secondary | ICD-10-CM | POA: Diagnosis not present

## 2013-05-23 DIAGNOSIS — I82409 Acute embolism and thrombosis of unspecified deep veins of unspecified lower extremity: Secondary | ICD-10-CM | POA: Diagnosis not present

## 2013-05-23 DIAGNOSIS — Z5181 Encounter for therapeutic drug level monitoring: Secondary | ICD-10-CM

## 2013-05-23 LAB — POCT INR: INR: 4.1

## 2013-05-30 ENCOUNTER — Ambulatory Visit (INDEPENDENT_AMBULATORY_CARE_PROVIDER_SITE_OTHER): Payer: Worker's Compensation | Admitting: Pharmacist

## 2013-05-30 DIAGNOSIS — I2699 Other pulmonary embolism without acute cor pulmonale: Secondary | ICD-10-CM | POA: Diagnosis not present

## 2013-05-30 DIAGNOSIS — I82409 Acute embolism and thrombosis of unspecified deep veins of unspecified lower extremity: Secondary | ICD-10-CM

## 2013-05-30 DIAGNOSIS — Z7901 Long term (current) use of anticoagulants: Secondary | ICD-10-CM | POA: Diagnosis not present

## 2013-05-30 DIAGNOSIS — Z5181 Encounter for therapeutic drug level monitoring: Secondary | ICD-10-CM | POA: Diagnosis not present

## 2013-05-30 LAB — POCT INR: INR: 4.5

## 2013-06-06 ENCOUNTER — Ambulatory Visit (INDEPENDENT_AMBULATORY_CARE_PROVIDER_SITE_OTHER): Payer: Worker's Compensation | Admitting: Surgery

## 2013-06-06 DIAGNOSIS — I82409 Acute embolism and thrombosis of unspecified deep veins of unspecified lower extremity: Secondary | ICD-10-CM

## 2013-06-06 DIAGNOSIS — Z5181 Encounter for therapeutic drug level monitoring: Secondary | ICD-10-CM

## 2013-06-06 DIAGNOSIS — I2699 Other pulmonary embolism without acute cor pulmonale: Secondary | ICD-10-CM

## 2013-06-06 DIAGNOSIS — Z7901 Long term (current) use of anticoagulants: Secondary | ICD-10-CM

## 2013-06-06 LAB — POCT INR: INR: 4.1

## 2013-06-08 ENCOUNTER — Other Ambulatory Visit: Payer: Self-pay | Admitting: Internal Medicine

## 2013-06-08 NOTE — Telephone Encounter (Signed)
Faxed hardcopy to [facility]. 

## 2013-06-13 ENCOUNTER — Ambulatory Visit (INDEPENDENT_AMBULATORY_CARE_PROVIDER_SITE_OTHER): Payer: Worker's Compensation | Admitting: *Deleted

## 2013-06-13 DIAGNOSIS — Z5181 Encounter for therapeutic drug level monitoring: Secondary | ICD-10-CM

## 2013-06-13 DIAGNOSIS — I2699 Other pulmonary embolism without acute cor pulmonale: Secondary | ICD-10-CM

## 2013-06-13 DIAGNOSIS — Z7901 Long term (current) use of anticoagulants: Secondary | ICD-10-CM

## 2013-06-13 DIAGNOSIS — I82409 Acute embolism and thrombosis of unspecified deep veins of unspecified lower extremity: Secondary | ICD-10-CM

## 2013-06-13 LAB — POCT INR: INR: 3.2

## 2013-06-19 ENCOUNTER — Ambulatory Visit (INDEPENDENT_AMBULATORY_CARE_PROVIDER_SITE_OTHER): Payer: BC Managed Care – PPO | Admitting: Internal Medicine

## 2013-06-19 ENCOUNTER — Other Ambulatory Visit (INDEPENDENT_AMBULATORY_CARE_PROVIDER_SITE_OTHER): Payer: BC Managed Care – PPO

## 2013-06-19 ENCOUNTER — Encounter: Payer: Self-pay | Admitting: Internal Medicine

## 2013-06-19 VITALS — BP 124/88 | HR 81 | Temp 98.6°F | Ht 63.5 in | Wt 346.2 lb

## 2013-06-19 DIAGNOSIS — G43909 Migraine, unspecified, not intractable, without status migrainosus: Secondary | ICD-10-CM

## 2013-06-19 DIAGNOSIS — Z0000 Encounter for general adult medical examination without abnormal findings: Secondary | ICD-10-CM

## 2013-06-19 LAB — URINALYSIS, ROUTINE W REFLEX MICROSCOPIC
BILIRUBIN URINE: NEGATIVE
Ketones, ur: NEGATIVE
NITRITE: NEGATIVE
Specific Gravity, Urine: 1.02 (ref 1.000–1.030)
Total Protein, Urine: NEGATIVE
Urine Glucose: NEGATIVE
Urobilinogen, UA: 0.2 (ref 0.0–1.0)
pH: 6 (ref 5.0–8.0)

## 2013-06-19 LAB — CBC WITH DIFFERENTIAL/PLATELET
Basophils Absolute: 0.1 10*3/uL (ref 0.0–0.1)
Basophils Relative: 0.7 % (ref 0.0–3.0)
EOS ABS: 0 10*3/uL (ref 0.0–0.7)
Eosinophils Relative: 0.5 % (ref 0.0–5.0)
HCT: 45.5 % (ref 36.0–46.0)
Hemoglobin: 14.8 g/dL (ref 12.0–15.0)
LYMPHS PCT: 29.9 % (ref 12.0–46.0)
Lymphs Abs: 2.4 10*3/uL (ref 0.7–4.0)
MCHC: 32.6 g/dL (ref 30.0–36.0)
MCV: 84.3 fl (ref 78.0–100.0)
Monocytes Absolute: 0.6 10*3/uL (ref 0.1–1.0)
Monocytes Relative: 7.7 % (ref 3.0–12.0)
Neutro Abs: 4.9 10*3/uL (ref 1.4–7.7)
Neutrophils Relative %: 61.2 % (ref 43.0–77.0)
PLATELETS: 213 10*3/uL (ref 150.0–400.0)
RBC: 5.39 Mil/uL — ABNORMAL HIGH (ref 3.87–5.11)
RDW: 15.7 % — ABNORMAL HIGH (ref 11.5–15.5)
WBC: 8 10*3/uL (ref 4.0–10.5)

## 2013-06-19 LAB — TSH: TSH: 1.05 u[IU]/mL (ref 0.35–4.50)

## 2013-06-19 LAB — BASIC METABOLIC PANEL
BUN: 11 mg/dL (ref 6–23)
CHLORIDE: 101 meq/L (ref 96–112)
CO2: 28 meq/L (ref 19–32)
Calcium: 9.5 mg/dL (ref 8.4–10.5)
Creatinine, Ser: 0.6 mg/dL (ref 0.4–1.2)
GFR: 130.6 mL/min (ref >60.00)
Glucose, Bld: 90 mg/dL (ref 70–99)
POTASSIUM: 3.8 meq/L (ref 3.5–5.1)
Sodium: 139 mEq/L (ref 135–145)

## 2013-06-19 LAB — LIPID PANEL
CHOL/HDL RATIO: 5
Cholesterol: 199 mg/dL (ref 0–200)
HDL: 43.1 mg/dL (ref >39.00)
LDL Cholesterol: 127 mg/dL — ABNORMAL HIGH (ref 0–99)
NonHDL: 155.9
TRIGLYCERIDES: 145 mg/dL (ref 0.0–149.0)
VLDL: 29 mg/dL (ref 0.0–40.0)

## 2013-06-19 LAB — HEPATIC FUNCTION PANEL
ALT: 16 U/L (ref 0–35)
AST: 21 U/L (ref 0–37)
Albumin: 3.8 g/dL (ref 3.5–5.2)
Alkaline Phosphatase: 81 U/L (ref 39–117)
BILIRUBIN TOTAL: 0.4 mg/dL (ref 0.2–1.2)
Bilirubin, Direct: 0.1 mg/dL (ref 0.0–0.3)
TOTAL PROTEIN: 7.4 g/dL (ref 6.0–8.3)

## 2013-06-19 MED ORDER — SUMATRIPTAN SUCCINATE 100 MG PO TABS: 100.0000 mg | ORAL_TABLET | ORAL | Status: DC | PRN

## 2013-06-19 MED ORDER — COLCHICINE 0.6 MG PO TABS: 0.6000 mg | ORAL_TABLET | Freq: Every day | ORAL | Status: DC

## 2013-06-19 NOTE — Assessment & Plan Note (Signed)
For med refill, refer [facility]

## 2013-06-19 NOTE — Progress Notes (Signed)
Subjective:    Patient ID: Mia GradCatherine J Tierney, female    DOB: [birth year], 58 y.o.   MRN: [id number]  HPI  Here for wellness and f/u;  Overall doing ok;  Pt denies CP, worsening SOB, DOE, wheezing, orthopnea, PND, worsening LE edema, palpitations, dizziness or syncope.  Pt denies neurological change such as new headache, facial or extremity weakness.  Pt denies polydipsia, polyuria, or low sugar symptoms. Pt states overall good compliance with treatment and medications, good tolerability, and has been trying to follow lower cholesterol diet.  Pt denies worsening depressive symptoms, suicidal ideation or panic. No fever, night sweats, wt loss, loss of appetite, or other constitutional symptoms.  Pt states good ability with ADL's, has low fall risk, home safety reviewed and adequate, no other significant changes in hearing or vision, and only occasionally active with exercise.  sister died wih pancreatic ca last mo, pt now with increased stress , and now daily migraine, our of migraine med imitrex for the past wk.  Had recent episode gout right foot, out of colchcine for some time. Linzess works for her chronic constipation, gives her loose stools, some dark, on coumadin, last cbc dec 2013.  Tolerating lipitor since dec 2014.  To see Dr Patty SermonsBrackbill, ortho, and coumadin clinic tomorrow.  Hard to lose wt, limited in activity due to knee pain Past Medical History  Diagnosis Date  . Hypertension   . Morbid obesity   . Nonsmoker   . Pulmonary embolism 2010    following right knee replacement  . DVT (deep venous thrombosis)     right upper extremitiy  . Chronic anticoagulation   . Infected prosthetic knee joint   . H/O: GI bleed   . Anemia   . IBS (irritable bowel syndrome)   . Hyperlipidemia   . WPW (Wolff-Parkinson-White syndrome)   . Anemia, unspecified 04/23/2011  . Diverticulosis   . Colon polyps may 2011  . Allergic rhinitis, cause unspecified 04/29/2011  . Migraine 04/29/2011  . Gout 04/22/2010    . GERD (gastroesophageal reflux disease) 11/10/2011   Past Surgical History  Procedure Laterality Date  . Right knee replacement  2010    followed by I & D for infection  . Cholecystectomy    . Tubal ligation      reports that she has never smoked. She has never used smokeless tobacco. She reports that she does not drink alcohol or use illicit drugs. family history includes Colon cancer in an other family member; Coronary artery disease in an other family member. Allergies  Allergen Reactions  . Amlodipine     Edema,ha  . Crestor [Rosuvastatin Calcium]     Chest pain  . Piperacillin Sod-Tazobactam So   . Ultram [Tramadol]     nausea   / Current Outpatient Prescriptions on File Prior to Visit  Medication Sig Dispense Refill  . atorvastatin (LIPITOR) 20 MG tablet Take 1 tablet (20 mg total) by mouth daily.  90 tablet  3  . butalbital-acetaminophen-caffeine (FIORICET, ESGIC) 50-325-40 MG per tablet TAKE 1 TABLET BY MOUTH TWICE DAILY AS NEEDED FOR HEADACHE  14 tablet  0  . docusate sodium (COLACE) 100 MG capsule Take 100 mg by mouth 2 (two) times daily.      . furosemide (LASIX) 40 MG tablet TAKE 1 TABLET BY MOUTH DAILY AS NEEDED  90 tablet  0  . guanFACINE (TENEX) 1 MG tablet TAKE 1 TABLET BY MOUTH AT BEDTIME  90 tablet  3  . lidocaine (LIDODERM)  5 % Place 1 patch onto the skin daily. Remove & Discard patch within 12 hours or as directed by MD  60 patch  2  . Linaclotide (LINZESS) 290 MCG CAPS capsule Take 1 capsule (290 mcg total) by mouth daily.  90 capsule  3  . pantoprazole (PROTONIX) 40 MG tablet Take 1 tablet (40 mg total) by mouth daily.  90 tablet  3  . polyethylene glycol powder (GLYCOLAX/MIRALAX) powder TAKE 17 GRAMS BY MOUTH DAILY AS NEEDED FOR CONSTIPATION  527 g  6  . telmisartan (MICARDIS) 80 MG tablet TAKE 1 TABLET BY MOUTH DAILY  90 tablet  3  . warfarin (COUMADIN) 5 MG tablet TAKE AS DIRECTED BY ANTICOAGULATION CLINIC  60 tablet  3   No current facility-administered  medications on file prior to visit.   Review of Systems Constitutional: Negative for increased diaphoresis, other activity, appetite or other siginficant weight change  HENT: Negative for worsening hearing loss, ear pain, facial swelling, mouth sores and neck stiffness.   Eyes: Negative for other worsening pain, redness or visual disturbance.  Respiratory: Negative for shortness of breath and wheezing.   Cardiovascular: Negative for chest pain and palpitations.  Gastrointestinal: Negative for diarrhea, blood in stool, abdominal distention or other pain Genitourinary: Negative for hematuria, flank pain or change in urine volume.  Musculoskeletal: Negative for myalgias or other joint complaints.  Skin: Negative for color change and wound.  Neurological: Negative for syncope and numbness. other than noted Hematological: Negative for adenopathy. or other swelling Psychiatric/Behavioral: Negative for hallucinations, self-injury, decreased concentration or other worsening agitation.      Objective:   Physical Exam BP 124/88  Pulse 81  Temp(Src) 98.6 F (37 C) (Oral)  Ht 5' 3.5" (1.613 m)  Wt 346 lb 4 oz (157.058 kg)  BMI 60.37 kg/m2  SpO2 97% VS noted,  Constitutional: Pt is oriented to person, place, and time. Appears well-developed and well-nourished.  Head: Normocephalic and atraumatic.  Right Ear: External ear normal.  Left Ear: External ear normal.  Nose: Nose normal.  Mouth/Throat: Oropharynx is clear and moist.  Eyes: Conjunctivae and EOM are normal. Pupils are equal, round, and reactive to light.  Neck: Normal range of motion. Neck supple. No JVD present. No tracheal deviation present.  Cardiovascular: Normal rate, regular rhythm, normal heart sounds and intact distal pulses.   Pulmonary/Chest: Effort normal and breath sounds without rales or wheezing  Abdominal: Soft. Bowel sounds are normal. NT. No HSM  Musculoskeletal: Normal range of motion. Exhibits no edema.    Lymphadenopathy:  Has no cervical adenopathy.  Neurological: Pt is alert and oriented to person, place, and time. Pt has normal reflexes. No cranial nerve deficit. Motor grossly intact Skin: Skin is warm and dry. No rash noted.  Psychiatric:  Has normal mood and affect. Behavior is normal.     Assessment & Plan:

## 2013-06-19 NOTE — Assessment & Plan Note (Signed)

## 2013-06-19 NOTE — Progress Notes (Signed)
Pre visit review using our clinic review tool, if applicable. No additional management support is needed unless otherwise documented below in the visit note. 

## 2013-06-19 NOTE — Patient Instructions (Signed)

## 2013-06-20 ENCOUNTER — Ambulatory Visit (INDEPENDENT_AMBULATORY_CARE_PROVIDER_SITE_OTHER): Payer: Worker's Compensation | Admitting: *Deleted

## 2013-06-20 ENCOUNTER — Encounter: Payer: Self-pay | Admitting: Cardiology

## 2013-06-20 ENCOUNTER — Encounter: Payer: Self-pay | Admitting: Internal Medicine

## 2013-06-20 ENCOUNTER — Other Ambulatory Visit: Payer: Self-pay | Admitting: Internal Medicine

## 2013-06-20 ENCOUNTER — Ambulatory Visit (INDEPENDENT_AMBULATORY_CARE_PROVIDER_SITE_OTHER): Payer: Worker's Compensation | Admitting: Cardiology

## 2013-06-20 VITALS — BP 130/100 | HR 77 | Ht 63.5 in | Wt 346.0 lb

## 2013-06-20 DIAGNOSIS — I2699 Other pulmonary embolism without acute cor pulmonale: Secondary | ICD-10-CM

## 2013-06-20 DIAGNOSIS — I10 Essential (primary) hypertension: Secondary | ICD-10-CM

## 2013-06-20 DIAGNOSIS — Z7901 Long term (current) use of anticoagulants: Secondary | ICD-10-CM

## 2013-06-20 DIAGNOSIS — Z5181 Encounter for therapeutic drug level monitoring: Secondary | ICD-10-CM

## 2013-06-20 DIAGNOSIS — K5900 Constipation, unspecified: Secondary | ICD-10-CM

## 2013-06-20 DIAGNOSIS — I82409 Acute embolism and thrombosis of unspecified deep veins of unspecified lower extremity: Secondary | ICD-10-CM

## 2013-06-20 DIAGNOSIS — I119 Hypertensive heart disease without heart failure: Secondary | ICD-10-CM

## 2013-06-20 DIAGNOSIS — I456 Pre-excitation syndrome: Secondary | ICD-10-CM

## 2013-06-20 DIAGNOSIS — E7800 Pure hypercholesterolemia, unspecified: Secondary | ICD-10-CM

## 2013-06-20 LAB — POCT INR: INR: 3.7

## 2013-06-20 MED ORDER — ATORVASTATIN CALCIUM 40 MG PO TABS: 40.0000 mg | ORAL_TABLET | Freq: Every day | ORAL | Status: DC

## 2013-06-20 MED ORDER — ATORVASTATIN CALCIUM 80 MG PO TABS: 80.0000 mg | ORAL_TABLET | Freq: Every day | ORAL | Status: DC

## 2013-06-20 NOTE — Assessment & Plan Note (Signed)
The patient is trying to drink a lot of water.  However she remains severely constipated.  She has taken some Linzess after which she has had some fecal incontinence and diarrhea.

## 2013-06-20 NOTE — Assessment & Plan Note (Signed)
The patient has a history of hypertension.  She has easy fatigue.  If she goes to the store she cannot shop for more than 10 minutes before she has to sit down and rest.

## 2013-06-20 NOTE — Patient Instructions (Addendum)
Your physician has recommended you make the following change in your medication: INCREASE LIPITOR ( 80 MG ) DAILY, SENT IN TO [facility] [address]   3 months with fasting labs (LP/BMET/HFP)  Your physician recommends that you keep your scheduled follow-up appointment in: 3 months with Dr. Patty SermonsBrackbill

## 2013-06-20 NOTE — Assessment & Plan Note (Signed)
Patient remains morbidly obese.  Her weight is down 4 pounds since last visit.  I encouraged her to continue to try very hard for additional weight loss.

## 2013-06-20 NOTE — Assessment & Plan Note (Signed)
She has not had any episodes of tachycardia.  Her EKG today shows no evidence of WPW pattern.

## 2013-06-20 NOTE — Progress Notes (Signed)
Mia [name] Date of Birth:  1955/04/20 [facility] [address] [place], [place]  [address] [phone]        Fax   [phone]   History of Present Illness: This pleasant 58 year old [place] American woman is seen for a three-month followup office visit. She has a history of morbid obesity and a complex past medical history which includes deep vein thrombosis and previous pulmonary emboli. She has been on long-term Coumadin. She also has essential hypertension and hypercholesterolemia. She has osteoarthritis and has had a previously infected right knee prosthesis and is followed also by infectious disease and by her orthopedist. Her orthopedist has told her however that he would not consider reoperation for 5 years.  She saw her orthopedist today who gave her an injection into her right knee. On a previous visit here her electrocardiogram showed a pattern of WPW although the patient has never had any symptoms from this. However a subsequent electrocardiogram on her subsequent office visit did not show any evidence of WPW. She continues to be under a lot of stress. Her younger sister died since last visit with pancreatic cancer. She continues to have a lot of problems with weight gain and with constipation.  Since last office visit she has had 2 falls in March and one in April.   Current Outpatient Prescriptions  Medication Sig Dispense Refill  . atorvastatin (LIPITOR) 80 MG tablet Take 1 tablet (80 mg total) by mouth daily.  30 tablet  3  . butalbital-acetaminophen-caffeine (FIORICET, ESGIC) 50-325-40 MG per tablet TAKE 1 TABLET BY MOUTH TWICE DAILY AS NEEDED FOR HEADACHE  14 tablet  0  . colchicine 0.6 MG tablet Take 1 tablet (0.6 mg total) by mouth daily. As needed  90 tablet  3  . furosemide (LASIX) 40 MG tablet TAKE 1 TABLET BY MOUTH DAILY AS NEEDED  90 tablet  0  . guanFACINE (TENEX) 1 MG tablet TAKE 1 TABLET BY MOUTH AT BEDTIME  90 tablet  3  . lidocaine  (LIDODERM) 5 % Place 1 patch onto the skin daily. Remove & Discard patch within 12 hours or as directed by MD  60 patch  2  . Linaclotide (LINZESS) 290 MCG CAPS capsule Take 290 mcg by mouth once a week.      . pantoprazole (PROTONIX) 40 MG tablet Take 1 tablet (40 mg total) by mouth daily.  90 tablet  3  . polyethylene glycol powder (GLYCOLAX/MIRALAX) powder TAKE 17 GRAMS BY MOUTH DAILY AS NEEDED FOR CONSTIPATION  527 g  6  . SUMAtriptan (IMITREX) 100 MG tablet Take 1 tablet (100 mg total) by mouth every 2 (two) hours as needed for migraine. May repeat in 2 hours if headache persists or recurs.  10 tablet  11  . telmisartan (MICARDIS) 80 MG tablet TAKE 1 TABLET BY MOUTH DAILY  90 tablet  3  . warfarin (COUMADIN) 5 MG tablet TAKE AS DIRECTED BY ANTICOAGULATION CLINIC  60 tablet  3   No current facility-administered medications for this visit.    Allergies  Allergen Reactions  . Amlodipine     Edema,ha  . Crestor [Rosuvastatin Calcium]     Chest pain  . Piperacillin Sod-Tazobactam So   . Ultram [Tramadol]     nausea    Patient Active Problem List   Diagnosis Date Noted  . DVT (deep venous thrombosis) 03/27/2010    Priority: High  . HTN (hypertension) 04/24/2010    Priority: Medium  .  Gout 04/22/2010    Priority: Medium  . PROSTHETIC JOINT COMPLICATION 02/16/2010    Priority: Medium  . Leg swelling 04/22/2010    Priority: Low  . Migraine 06/19/2013  . Encounter for therapeutic drug monitoring 02/12/2013  . Lower back pain 12/15/2012  . UTI (urinary tract infection) 09/15/2012  . Migraine, unspecified, without mention of intractable migraine without mention of status migrainosus 09/15/2012  . Right lumbar radiculopathy 02/15/2012  . GERD (gastroesophageal reflux disease) 11/10/2011  . Allergic rhinitis, cause unspecified 04/29/2011  . Migraine 04/29/2011  . Morbid obesity 04/29/2011  . Constipation 04/29/2011  . Preventative health care 04/23/2011  . IBS (irritable bowel  syndrome) 04/23/2011  . Anemia, unspecified 04/23/2011  . Diverticulosis   . Colon polyps   . Wolff-Parkinson-White (WPW) syndrome 04/01/2011  . Dyslipidemia 01/01/2011  . Edema of foot 10/26/2010  . 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 Fall 09/30/2010  . Other pulmonary embolism and infarction 04/24/2010  . Chronic anticoagulation   . Hair loss 04/22/2010  . METHICILLIN RESISTANT STAPHYLOCOCCUS AUREUS INFECTION 02/16/2010  . KLEBSIELLA PNEUMONIAE INFECTION 02/16/2010  . Bacteremia 02/16/2010    History  Smoking status  . Never Smoker   Smokeless tobacco  . Never Used    History  Alcohol Use No    Family History  Problem Relation Age of Onset  . Colon cancer    . Coronary artery disease      Review of Systems: Constitutional: no fever chills diaphoresis or fatigue or change in weight.  Head and neck: no hearing loss, no epistaxis, no photophobia or visual disturbance. Respiratory: No cough, shortness of breath or wheezing. Cardiovascular: No chest pain peripheral edema, palpitations. Gastrointestinal: No abdominal distention, no abdominal pain, no change in bowel habits hematochezia or melena. Genitourinary: No dysuria, no frequency, no urgency, no nocturia. Musculoskeletal:No arthralgias, no back pain, no gait disturbance or myalgias. Neurological: No dizziness, no headaches, no numbness, no seizures, no syncope, no weakness, no tremors. Hematologic: No lymphadenopathy, no easy bruising. Psychiatric: No confusion, no hallucinations, no sleep disturbance.    Physical Exam: Filed Vitals:   06/20/13 1412  BP: 130/100  Pulse: 77   The general appearance reveals a well-developed morbidly obese woman in no distress.The head and neck exam reveals pupils equal and reactive.  Extraocular movements are full.  There is no scleral icterus.  The mouth and pharynx are normal.  The neck is supple.  The carotids reveal no bruits.  The jugular venous pressure is  normal.  The  thyroid is not enlarged.  There is no lymphadenopathy.  The chest is clear to percussion and auscultation.  There are no rales or rhonchi.  Expansion of the chest is symmetrical.  The precordium is quiet.  The first heart sound is normal.  The second heart sound is physiologically split.  There is no murmur gallop rub or click.  There is no abnormal lift or heave.  The abdomen is soft and nontender.  The bowel sounds are normal.  The liver and spleen are not enlarged.  There are no abdominal masses.  There are no abdominal bruits.  Extremities reveal good pedal pulses.  There is no phlebitis or edema.  There is no cyanosis or clubbing.  Strength is normal and symmetrical in all extremities.  There is no lateralizing weakness.  There are no sensory deficits.  The skin is warm and dry.  There is no rash.  EKG shows normal sinus rhythm with left atrial enlargement and no  ischemic changes.  No evidence of WPW.  Assessment / Plan: 1.  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 Hypercholesterolemia 4.  Past history of deep vein thrombosis and pulmonary emboli, on long-term Coumadin. 5. morbid obesity 6. severe constipation  Plan: Continue same medication except we will increase Lipitor up to 80 mg daily.  Her LDL cholesterol remains above target. Recheck in 3 months for office visit lipid panel hepatic function panel and basal metabolic panel.

## 2013-06-21 ENCOUNTER — Telehealth: Payer: Self-pay | Admitting: Internal Medicine

## 2013-06-21 NOTE — Telephone Encounter (Signed)
Relevant patient education mailed to patient.  

## 2013-07-04 ENCOUNTER — Ambulatory Visit (INDEPENDENT_AMBULATORY_CARE_PROVIDER_SITE_OTHER): Payer: BC Managed Care – PPO | Admitting: Pharmacist

## 2013-07-04 DIAGNOSIS — Z7901 Long term (current) use of anticoagulants: Secondary | ICD-10-CM

## 2013-07-04 DIAGNOSIS — I82409 Acute embolism and thrombosis of unspecified deep veins of unspecified lower extremity: Secondary | ICD-10-CM

## 2013-07-04 DIAGNOSIS — I82401 Acute embolism and thrombosis of unspecified deep veins of right lower extremity: Secondary | ICD-10-CM

## 2013-07-04 DIAGNOSIS — I2699 Other pulmonary embolism without acute cor pulmonale: Secondary | ICD-10-CM

## 2013-07-04 DIAGNOSIS — Z5181 Encounter for therapeutic drug level monitoring: Secondary | ICD-10-CM

## 2013-07-04 LAB — POCT INR: INR: 2.4

## 2013-07-05 ENCOUNTER — Other Ambulatory Visit: Payer: Self-pay | Admitting: Cardiology

## 2013-07-25 ENCOUNTER — Ambulatory Visit (INDEPENDENT_AMBULATORY_CARE_PROVIDER_SITE_OTHER): Payer: BC Managed Care – PPO | Admitting: *Deleted

## 2013-07-25 DIAGNOSIS — Z7901 Long term (current) use of anticoagulants: Secondary | ICD-10-CM

## 2013-07-25 DIAGNOSIS — I2699 Other pulmonary embolism without acute cor pulmonale: Secondary | ICD-10-CM

## 2013-07-25 DIAGNOSIS — I82409 Acute embolism and thrombosis of unspecified deep veins of unspecified lower extremity: Secondary | ICD-10-CM

## 2013-07-25 DIAGNOSIS — Z5181 Encounter for therapeutic drug level monitoring: Secondary | ICD-10-CM

## 2013-07-25 LAB — POCT INR: INR: 5.3

## 2013-08-01 ENCOUNTER — Ambulatory Visit (INDEPENDENT_AMBULATORY_CARE_PROVIDER_SITE_OTHER): Payer: BC Managed Care – PPO | Admitting: Pharmacist

## 2013-08-01 DIAGNOSIS — Z5181 Encounter for therapeutic drug level monitoring: Secondary | ICD-10-CM

## 2013-08-01 DIAGNOSIS — I82401 Acute embolism and thrombosis of unspecified deep veins of right lower extremity: Secondary | ICD-10-CM

## 2013-08-01 DIAGNOSIS — Z7901 Long term (current) use of anticoagulants: Secondary | ICD-10-CM

## 2013-08-01 DIAGNOSIS — I82409 Acute embolism and thrombosis of unspecified deep veins of unspecified lower extremity: Secondary | ICD-10-CM

## 2013-08-01 DIAGNOSIS — I2699 Other pulmonary embolism without acute cor pulmonale: Secondary | ICD-10-CM

## 2013-08-01 LAB — POCT INR: INR: 3.3

## 2013-08-15 ENCOUNTER — Telehealth: Payer: Self-pay | Admitting: Cardiology

## 2013-08-15 ENCOUNTER — Ambulatory Visit (INDEPENDENT_AMBULATORY_CARE_PROVIDER_SITE_OTHER): Payer: BC Managed Care – PPO | Admitting: Pharmacist

## 2013-08-15 ENCOUNTER — Other Ambulatory Visit: Payer: Self-pay | Admitting: Obstetrics and Gynecology

## 2013-08-15 DIAGNOSIS — I82409 Acute embolism and thrombosis of unspecified deep veins of unspecified lower extremity: Secondary | ICD-10-CM

## 2013-08-15 DIAGNOSIS — Z7901 Long term (current) use of anticoagulants: Secondary | ICD-10-CM

## 2013-08-15 DIAGNOSIS — I2699 Other pulmonary embolism without acute cor pulmonale: Secondary | ICD-10-CM

## 2013-08-15 LAB — POCT INR: INR: 6.9

## 2013-08-15 NOTE — Telephone Encounter (Signed)
New message      Pt just left coumadin clinic.  She said someone from there is going to call her.  Please call her at [phone] til 5pm

## 2013-08-15 NOTE — Telephone Encounter (Signed)
Attempted to call patient to let her know lab result was not in yet.  Unable to reach.  Will call tomorrow morning with result.  She is to hold warfarin until she hears from us.  She understands to go to the ER if she notices any significant bleeding issues.

## 2013-08-16 LAB — PROTIME-INR
INR: 6.8 ratio — AB (ref 0.8–1.0)
PROTHROMBIN TIME: 71.1 s — AB (ref 9.6–13.1)

## 2013-08-16 LAB — CYTOLOGY - PAP

## 2013-08-16 NOTE — Telephone Encounter (Signed)
INR was 6.8.  Spoke with patient this morning who knows to hold warfarin for 3 days, resume dose on Sunday, and recheck INR in 4 days on Monday.

## 2013-08-20 ENCOUNTER — Ambulatory Visit (INDEPENDENT_AMBULATORY_CARE_PROVIDER_SITE_OTHER): Payer: Worker's Compensation

## 2013-08-20 DIAGNOSIS — Z5181 Encounter for therapeutic drug level monitoring: Secondary | ICD-10-CM

## 2013-08-20 DIAGNOSIS — I2699 Other pulmonary embolism without acute cor pulmonale: Secondary | ICD-10-CM

## 2013-08-20 DIAGNOSIS — I82409 Acute embolism and thrombosis of unspecified deep veins of unspecified lower extremity: Secondary | ICD-10-CM

## 2013-08-20 DIAGNOSIS — Z7901 Long term (current) use of anticoagulants: Secondary | ICD-10-CM

## 2013-08-20 LAB — POCT INR: INR: 1.6

## 2013-08-30 ENCOUNTER — Ambulatory Visit (INDEPENDENT_AMBULATORY_CARE_PROVIDER_SITE_OTHER): Payer: BC Managed Care – PPO | Admitting: *Deleted

## 2013-08-30 DIAGNOSIS — I82409 Acute embolism and thrombosis of unspecified deep veins of unspecified lower extremity: Secondary | ICD-10-CM

## 2013-08-30 DIAGNOSIS — I2699 Other pulmonary embolism without acute cor pulmonale: Secondary | ICD-10-CM

## 2013-08-30 DIAGNOSIS — Z7901 Long term (current) use of anticoagulants: Secondary | ICD-10-CM

## 2013-08-30 DIAGNOSIS — Z5181 Encounter for therapeutic drug level monitoring: Secondary | ICD-10-CM

## 2013-08-30 LAB — POCT INR: INR: 3.1

## 2013-09-20 ENCOUNTER — Ambulatory Visit (INDEPENDENT_AMBULATORY_CARE_PROVIDER_SITE_OTHER): Payer: BC Managed Care – PPO | Admitting: Cardiology

## 2013-09-20 ENCOUNTER — Encounter: Payer: Self-pay | Admitting: Cardiology

## 2013-09-20 ENCOUNTER — Ambulatory Visit (INDEPENDENT_AMBULATORY_CARE_PROVIDER_SITE_OTHER): Payer: BC Managed Care – PPO | Admitting: *Deleted

## 2013-09-20 VITALS — BP 136/84 | HR 80 | Ht 60.0 in | Wt 339.0 lb

## 2013-09-20 DIAGNOSIS — I82409 Acute embolism and thrombosis of unspecified deep veins of unspecified lower extremity: Secondary | ICD-10-CM

## 2013-09-20 DIAGNOSIS — I456 Pre-excitation syndrome: Secondary | ICD-10-CM

## 2013-09-20 DIAGNOSIS — I119 Hypertensive heart disease without heart failure: Secondary | ICD-10-CM

## 2013-09-20 DIAGNOSIS — Z7901 Long term (current) use of anticoagulants: Secondary | ICD-10-CM

## 2013-09-20 DIAGNOSIS — I82401 Acute embolism and thrombosis of unspecified deep veins of right lower extremity: Secondary | ICD-10-CM

## 2013-09-20 DIAGNOSIS — I10 Essential (primary) hypertension: Secondary | ICD-10-CM

## 2013-09-20 DIAGNOSIS — E7800 Pure hypercholesterolemia, unspecified: Secondary | ICD-10-CM

## 2013-09-20 DIAGNOSIS — I2699 Other pulmonary embolism without acute cor pulmonale: Secondary | ICD-10-CM

## 2013-09-20 DIAGNOSIS — Z5181 Encounter for therapeutic drug level monitoring: Secondary | ICD-10-CM

## 2013-09-20 LAB — POCT INR: INR: 2.4

## 2013-09-20 NOTE — Assessment & Plan Note (Signed)
Her blood pressure was elevated on admission today but she has not been taking her furosemide recently because of multiple daily appointments away from home.  This could account for the elevated blood pressure.

## 2013-09-20 NOTE — Assessment & Plan Note (Signed)
She has not been aware of any tachycardia or arrhythmias to suggest activation of her WPW

## 2013-09-20 NOTE — Assessment & Plan Note (Signed)
She remains on long-term Coumadin because of recurrent deep vein thrombosis.  Her INR is 2.4 today.  She is not having the bleeding complications from the anticoagulation.

## 2013-09-20 NOTE — Progress Notes (Signed)
Mia [name] Date of Birth:  24-Jul-1955 [facility] [address] [place], [place]  [address] [phone]        Fax   [phone]   History of Present Illness: This pleasant 58 year old African American woman is seen for a three-month followup office visit. She has a history of morbid obesity and a complex past medical history which includes deep vein thrombosis and previous pulmonary emboli. She has been on long-term Coumadin. She also has essential hypertension and hypercholesterolemia. She has osteoarthritis and has had a previously infected right knee prosthesis and is followed also by infectious disease and by her orthopedist. Her orthopedist has told her however that he would not consider reoperation for 5 years.  Her orthopedist recently put her on permanent disability.  . On a previous visit here her electrocardiogram showed a pattern of WPW although the patient has never had any symptoms from this. However a subsequent electrocardiogram on her subsequent office visit did not show any evidence of WPW.  She has been staying busy at home looking after some grandchildren.  She has developed some left lateral chest wall soreness from leaning over to pick them up and leaning at an angle.  Current Outpatient Prescriptions  Medication Sig Dispense Refill  . atorvastatin (LIPITOR) 80 MG tablet Take 40 mg by mouth daily.      . butalbital-acetaminophen-caffeine (FIORICET, ESGIC) 50-325-40 MG per tablet TAKE 1 TABLET BY MOUTH TWICE DAILY AS NEEDED FOR HEADACHE  14 tablet  0  . furosemide (LASIX) 40 MG tablet TAKE 1 TABLET BY MOUTH DAILY AS NEEDED  90 tablet  0  . guanFACINE (TENEX) 1 MG tablet TAKE 1 TABLET BY MOUTH AT BEDTIME  90 tablet  3  . lidocaine (LIDODERM) 5 % Place 1 patch onto the skin as needed. Remove & Discard patch within 12 hours or as directed by MD      . Linaclotide (LINZESS) 290 MCG CAPS capsule Take 290 mcg by mouth as needed.       .  polyethylene glycol powder (GLYCOLAX/MIRALAX) powder TAKE 17 GRAMS BY MOUTH DAILY AS NEEDED FOR CONSTIPATION  527 g  6  . SUMAtriptan (IMITREX) 100 MG tablet Take 1 tablet (100 mg total) by mouth every 2 (two) hours as needed for migraine. May repeat in 2 hours if headache persists or recurs.  10 tablet  11  . telmisartan (MICARDIS) 80 MG tablet TAKE 1 TABLET BY MOUTH DAILY  90 tablet  3  . warfarin (COUMADIN) 5 MG tablet Take one tablet everyday but take one and 1/2 on Sundays, Tuesdays, and Thursdays.      . pantoprazole (PROTONIX) 40 MG tablet Take 1 tablet (40 mg total) by mouth daily.  90 tablet  3   No current facility-administered medications for this visit.    Allergies  Allergen Reactions  . Amlodipine     Edema,ha  . Crestor [Rosuvastatin Calcium]     Chest pain  . Piperacillin Sod-Tazobactam So   . Ultram [Tramadol]     nausea    Patient Active Problem List   Diagnosis Date Noted  . DVT (deep venous thrombosis) 03/27/2010    Priority: High  . HTN (hypertension) 04/24/2010    Priority: Medium  . Gout 04/22/2010    Priority: Medium  . PROSTHETIC JOINT COMPLICATION 02/16/2010    Priority: Medium  . Leg swelling 04/22/2010    Priority: Low  . Migraine 06/19/2013  . Encounter for therapeutic drug  monitoring 02/12/2013  . Lower back pain 12/15/2012  . UTI (urinary tract infection) 09/15/2012  . Migraine, unspecified, without mention of intractable migraine without mention of status migrainosus 09/15/2012  . Right lumbar radiculopathy 02/15/2012  . GERD (gastroesophageal reflux disease) 11/10/2011  . Allergic rhinitis, cause unspecified 04/29/2011  . Migraine 04/29/2011  . Morbid obesity 04/29/2011  . Constipation 04/29/2011  . Preventative health care 04/23/2011  . IBS (irritable bowel syndrome) 04/23/2011  . Anemia, unspecified 04/23/2011  . Diverticulosis   . Colon polyps   . Wolff-Parkinson-White (WPW) syndrome 04/01/2011  . Dyslipidemia 01/01/2011  . Edema  of foot 10/26/2010  . 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 Fall 09/30/2010  . Other pulmonary embolism and infarction 04/24/2010  . Chronic anticoagulation   . Hair loss 04/22/2010  . METHICILLIN RESISTANT STAPHYLOCOCCUS AUREUS INFECTION 02/16/2010  . KLEBSIELLA PNEUMONIAE INFECTION 02/16/2010  . Bacteremia 02/16/2010    History  Smoking status  . Never Smoker   Smokeless tobacco  . Never Used    History  Alcohol Use No    Family History  Problem Relation Age of Onset  . Colon cancer    . Coronary artery disease      Review of Systems: Constitutional: no fever chills diaphoresis or fatigue or change in weight.  Head and neck: no hearing loss, no epistaxis, no photophobia or visual disturbance. Respiratory: No cough, shortness of breath or wheezing. Cardiovascular: No chest pain peripheral edema, palpitations. Gastrointestinal: No abdominal distention, no abdominal pain, no change in bowel habits hematochezia or melena. Genitourinary: No dysuria, no frequency, no urgency, no nocturia. Musculoskeletal:No arthralgias, no back pain, no gait disturbance or myalgias. Neurological: No dizziness, no headaches, no numbness, no seizures, no syncope, no weakness, no tremors. Hematologic: No lymphadenopathy, no easy bruising. Psychiatric: No confusion, no hallucinations, no sleep disturbance.    Physical Exam: Filed Vitals:   09/20/13 1332  BP: 146/90  Pulse: 80   The general appearance reveals a well-developed morbidly obese woman in no distress.The head and neck exam reveals pupils equal and reactive.  Extraocular movements are full.  There is no scleral icterus.  The mouth and pharynx are normal.  The neck is supple.  The carotids reveal no bruits.  The jugular venous pressure is normal.  The  thyroid is not enlarged.  There is no lymphadenopathy.  The chest is clear to percussion and auscultation.  There are no rales or rhonchi.  Expansion of the chest is  symmetrical.  The precordium is quiet.  The first heart sound is normal.  The second heart sound is physiologically split.  There is no murmur gallop rub or click.  There is no abnormal lift or heave.  The abdomen is soft and nontender.  The bowel sounds are normal.  The liver and spleen are not enlarged.  There are no abdominal masses.  There are no abdominal bruits.  Extremities reveal good pedal pulses.  There is no phlebitis or edema.  There is no cyanosis or clubbing.  Strength is normal and symmetrical in all extremities.  There is no lateralizing weakness.  There are no sensory deficits.  The skin is warm and dry.  There is no rash.  EKG shows normal sinus rhythm with left atrial enlargement and no ischemic changes.  No evidence of WPW.  Assessment / Plan: 1.  WPW, inactive. 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 Hypercholesterolemia 4.  Past history of deep vein thrombosis and pulmonary emboli, on long-term Coumadin.  5. morbid obesity 6. severe constipation  Plan: Continue same medication.  She is now on high-dose Lipitor.  She did not fast today.  We will plan to get lab work on her at her next visit.  She reports that her constipation has shown some improvement on Linzess. Recheck in 3 months for office visit EKG lipid panel hepatic function panel basal metabolic panel and CBC I encouraged her in her efforts to lose weight.  Since last visit she has lost 7 pounds.

## 2013-09-20 NOTE — Patient Instructions (Signed)
Your physician recommends that you continue on your current medications as directed. Please refer to the Current Medication list given to you today.  Your physician recommends that you schedule a follow-up appointment in: 3 months with fasting labs (LP/BMET/HFP/cbc) and ekg

## 2013-10-11 ENCOUNTER — Ambulatory Visit (INDEPENDENT_AMBULATORY_CARE_PROVIDER_SITE_OTHER): Payer: BC Managed Care – PPO

## 2013-10-11 DIAGNOSIS — I82409 Acute embolism and thrombosis of unspecified deep veins of unspecified lower extremity: Secondary | ICD-10-CM

## 2013-10-11 DIAGNOSIS — Z7901 Long term (current) use of anticoagulants: Secondary | ICD-10-CM

## 2013-10-11 DIAGNOSIS — Z5181 Encounter for therapeutic drug level monitoring: Secondary | ICD-10-CM

## 2013-10-11 DIAGNOSIS — I2699 Other pulmonary embolism without acute cor pulmonale: Secondary | ICD-10-CM

## 2013-10-11 LAB — POCT INR: INR: 2.6

## 2013-11-08 ENCOUNTER — Ambulatory Visit (INDEPENDENT_AMBULATORY_CARE_PROVIDER_SITE_OTHER): Payer: BC Managed Care – PPO | Admitting: *Deleted

## 2013-11-08 DIAGNOSIS — I2699 Other pulmonary embolism without acute cor pulmonale: Secondary | ICD-10-CM

## 2013-11-08 DIAGNOSIS — I82409 Acute embolism and thrombosis of unspecified deep veins of unspecified lower extremity: Secondary | ICD-10-CM

## 2013-11-08 DIAGNOSIS — Z5181 Encounter for therapeutic drug level monitoring: Secondary | ICD-10-CM

## 2013-11-08 DIAGNOSIS — Z7901 Long term (current) use of anticoagulants: Secondary | ICD-10-CM

## 2013-11-08 LAB — POCT INR: INR: 2.9

## 2013-11-14 ENCOUNTER — Other Ambulatory Visit: Payer: Self-pay | Admitting: Surgery

## 2013-11-14 MED ORDER — WARFARIN SODIUM 5 MG PO TABS: 5.0000 mg | ORAL_TABLET | Freq: Every day | ORAL | Status: DC

## 2013-12-06 ENCOUNTER — Ambulatory Visit (INDEPENDENT_AMBULATORY_CARE_PROVIDER_SITE_OTHER): Payer: BC Managed Care – PPO | Admitting: *Deleted

## 2013-12-06 DIAGNOSIS — I82409 Acute embolism and thrombosis of unspecified deep veins of unspecified lower extremity: Secondary | ICD-10-CM

## 2013-12-06 DIAGNOSIS — Z5181 Encounter for therapeutic drug level monitoring: Secondary | ICD-10-CM

## 2013-12-06 DIAGNOSIS — Z7901 Long term (current) use of anticoagulants: Secondary | ICD-10-CM

## 2013-12-06 DIAGNOSIS — I2699 Other pulmonary embolism without acute cor pulmonale: Secondary | ICD-10-CM

## 2013-12-06 LAB — POCT INR: INR: 2.1

## 2013-12-11 ENCOUNTER — Other Ambulatory Visit: Payer: Self-pay | Admitting: *Deleted

## 2013-12-11 MED ORDER — WARFARIN SODIUM 5 MG PO TABS
5.0000 mg | ORAL_TABLET | Freq: Every day | ORAL | Status: DC
Start: 2013-12-11 — End: 2013-12-20

## 2013-12-19 ENCOUNTER — Ambulatory Visit (INDEPENDENT_AMBULATORY_CARE_PROVIDER_SITE_OTHER): Payer: BC Managed Care – PPO | Admitting: Cardiology

## 2013-12-19 ENCOUNTER — Encounter: Payer: Self-pay | Admitting: Cardiology

## 2013-12-19 ENCOUNTER — Ambulatory Visit: Payer: Self-pay | Admitting: Internal Medicine

## 2013-12-19 VITALS — BP 168/100 | HR 89 | Ht 63.0 in | Wt 342.4 lb

## 2013-12-19 DIAGNOSIS — E7800 Pure hypercholesterolemia, unspecified: Secondary | ICD-10-CM

## 2013-12-19 DIAGNOSIS — I456 Pre-excitation syndrome: Secondary | ICD-10-CM

## 2013-12-19 DIAGNOSIS — I10 Essential (primary) hypertension: Secondary | ICD-10-CM

## 2013-12-19 DIAGNOSIS — I119 Hypertensive heart disease without heart failure: Secondary | ICD-10-CM

## 2013-12-19 DIAGNOSIS — I82401 Acute embolism and thrombosis of unspecified deep veins of right lower extremity: Secondary | ICD-10-CM

## 2013-12-19 MED ORDER — LINACLOTIDE 290 MCG PO CAPS: 290.0000 ug | ORAL_CAPSULE | ORAL | Status: DC | PRN

## 2013-12-19 NOTE — Assessment & Plan Note (Signed)
The patient's blood pressure has been running high.  She has been missing some of her doses of medication because of cost.

## 2013-12-19 NOTE — Assessment & Plan Note (Signed)
She is still out of work and covered under workers compensation.  She has requested that we write to her attorney notifying him that we will continue to care for her under her Workmen's Compensation.

## 2013-12-19 NOTE — Assessment & Plan Note (Signed)
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  This does not cause her any symptoms.

## 2013-12-19 NOTE — Assessment & Plan Note (Signed)
She has had no symptoms of recurrent DVT.  She remains on warfarin.  She is not having any bleeding from the warfarin.

## 2013-12-19 NOTE — Patient Instructions (Signed)
Your physician recommends that you continue on your current medications as directed. Please refer to the Current Medication list given to you today.  Your physician recommends that you schedule a follow-up appointment in: 3 months with fasting labs (LP/BMET/HFP/CBC)  

## 2013-12-19 NOTE — Progress Notes (Signed)
Mia [name] Date of Birth:  17-Apr-1955 [facility] [address] [place], [place]  [id number] [phone]        Fax   [phone]   History of Present Illness: This pleasant 58 year old African American woman is seen for a three-month followup office visit. She has a history of morbid obesity and a complex past medical history which includes deep vein thrombosis and previous pulmonary emboli. She has been on long-term Coumadin. She also has essential hypertension and hypercholesterolemia. She has osteoarthritis and has had a previously infected right knee prosthesis and is followed also by infectious disease and by her orthopedist. Her orthopedist has told her however that he would not consider reoperation for 5 years.  Her orthopedist recently put her on permanent disability.  . On a previous visit here her electrocardiogram showed a pattern of WPW although the patient has never had any symptoms from this. However a subsequent electrocardiogram on her subsequent office visit did not show any evidence of WPW.  She continues to have problems with morbid obesity and with severe constipation.  She has had some delays in getting her money to pay for medication.  Current Outpatient Prescriptions  Medication Sig Dispense Refill  . atorvastatin (LIPITOR) 80 MG tablet Take 40 mg by mouth daily.    . butalbital-acetaminophen-caffeine (FIORICET, ESGIC) 50-325-40 MG per tablet TAKE 1 TABLET BY MOUTH TWICE DAILY AS NEEDED FOR HEADACHE 14 tablet 0  . furosemide (LASIX) 40 MG tablet TAKE 1 TABLET BY MOUTH DAILY AS NEEDED 90 tablet 0  . guanFACINE (TENEX) 1 MG tablet TAKE 1 TABLET BY MOUTH AT BEDTIME 90 tablet 3  . lidocaine (LIDODERM) 5 % Place 1 patch onto the skin as needed. Remove & Discard patch within 12 hours or as directed by MD    . Linaclotide (LINZESS) 290 MCG CAPS capsule Take 1 capsule (290 mcg total) by mouth as needed. 30 capsule 3  . polyethylene glycol powder  (GLYCOLAX/MIRALAX) powder TAKE 17 GRAMS BY MOUTH DAILY AS NEEDED FOR CONSTIPATION 527 g 6  . SUMAtriptan (IMITREX) 100 MG tablet Take 1 tablet (100 mg total) by mouth every 2 (two) hours as needed for migraine. May repeat in 2 hours if headache persists or recurs. 10 tablet 11  . warfarin (COUMADIN) 5 MG tablet Take 1 tablet (5 mg total) by mouth daily. Take one tablet everyday except one and 1/2 on Sundays, Tuesdays, and Thursdays. 45 tablet 3  . pantoprazole (PROTONIX) 40 MG tablet Take 40 mg by mouth daily.    Marland Kitchen. telmisartan (MICARDIS) 80 MG tablet Take 80 mg by mouth daily.     No current facility-administered medications for this visit.    Allergies  Allergen Reactions  . Amlodipine     Edema,ha  . Crestor [Rosuvastatin Calcium]     Chest pain  . Piperacillin Sod-Tazobactam So   . Ultram [Tramadol]     nausea    Patient Active Problem List   Diagnosis Date Noted  . DVT (deep venous thrombosis) 03/27/2010    Priority: High  . HTN (hypertension) 04/24/2010    Priority: Medium  . Gout 04/22/2010    Priority: Medium  . PROSTHETIC JOINT COMPLICATION 02/16/2010    Priority: Medium  . Leg swelling 04/22/2010    Priority: Low  . Migraine 06/19/2013  . Encounter for therapeutic drug monitoring 02/12/2013  . Lower back pain 12/15/2012  . UTI (urinary tract infection) 09/15/2012  . Migraine, unspecified, without mention of  intractable migraine without mention of status migrainosus 09/15/2012  . Right lumbar radiculopathy 02/15/2012  . GERD (gastroesophageal reflux disease) 11/10/2011  . Allergic rhinitis, cause unspecified 04/29/2011  . Migraine 04/29/2011  . Morbid obesity 04/29/2011  . Constipation 04/29/2011  . Preventative health care 04/23/2011  . IBS (irritable bowel syndrome) 04/23/2011  . Anemia, unspecified 04/23/2011  . Diverticulosis   . Colon polyps   . Wolff-Parkinson-White (WPW) syndrome 04/01/2011  . Dyslipidemia 01/01/2011  . Edema of foot 10/26/2010  .  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 Fall 09/30/2010  . Other pulmonary embolism and infarction 04/24/2010  . Chronic anticoagulation   . Hair loss 04/22/2010  . METHICILLIN RESISTANT STAPHYLOCOCCUS AUREUS INFECTION 02/16/2010  . KLEBSIELLA PNEUMONIAE INFECTION 02/16/2010  . Bacteremia 02/16/2010    History  Smoking status  . Never Smoker   Smokeless tobacco  . Never Used    History  Alcohol Use No    Family History  Problem Relation Age of Onset  . Colon cancer    . Coronary artery disease      Review of Systems: Constitutional: no fever chills diaphoresis or fatigue or change in weight.  Head and neck: no hearing loss, no epistaxis, no photophobia or visual disturbance. Respiratory: No cough, shortness of breath or wheezing. Cardiovascular: No chest pain peripheral edema, palpitations. Gastrointestinal: No abdominal distention, no abdominal pain, no change in bowel habits hematochezia or melena. Genitourinary: No dysuria, no frequency, no urgency, no nocturia. Musculoskeletal:No arthralgias, no back pain, no gait disturbance or myalgias. Neurological: No dizziness, no headaches, no numbness, no seizures, no syncope, no weakness, no tremors. Hematologic: No lymphadenopathy, no easy bruising. Psychiatric: No confusion, no hallucinations, no sleep disturbance.    Physical Exam: Filed Vitals:   12/19/13 0915  BP: 168/100  Pulse: 89   The general appearance reveals a well-developed morbidly obese woman in no distress.The head and neck exam reveals pupils equal and reactive.  Extraocular movements are full.  There is no scleral icterus.  The mouth and pharynx are normal.  The neck is supple.  The carotids reveal no bruits.  The jugular venous pressure is normal.  The  thyroid is not enlarged.  There is no lymphadenopathy.  The chest is clear to percussion and auscultation.  There are no rales or rhonchi.  Expansion of the chest is symmetrical.  The precordium is  quiet.  The first heart sound is normal.  The second heart sound is physiologically split.  There is no murmur gallop rub or click.  There is no abnormal lift or heave.  The abdomen is soft and nontender.  The bowel sounds are normal.  The liver and spleen are not enlarged.  There are no abdominal masses.  There are no abdominal bruits.  Extremities reveal good pedal pulses.  There is no phlebitis or edema.  There is no cyanosis or clubbing.  Strength is normal and symmetrical in all extremities.  There is no lateralizing weakness.  There are no sensory deficits.  The skin is warm and dry.  There is no rash.  EKG today shows intermittent WPW pattern  Assessment / Plan: 1.  WPW, intermittent, asymptomatic. 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 Hypercholesterolemia 4.  Past history of deep vein thrombosis and pulmonary emboli, on long-term Coumadin. 5. morbid obesity 6. severe constipation  Plan: Continue same medication.  Recheck in 3 months for follow-up office visit CBC lipid panel hepatic function panel and basal metabolic panel. As she requests,  we will write a letter to her attorney concerning the fact that we will continue to take care of her under her workman's compensation.

## 2013-12-20 ENCOUNTER — Telehealth: Payer: Self-pay | Admitting: Cardiology

## 2013-12-20 MED ORDER — WARFARIN SODIUM 5 MG PO TABS: 5.0000 mg | ORAL_TABLET | Freq: Every day | ORAL | Status: DC

## 2013-12-20 NOTE — Telephone Encounter (Signed)
New message      Need note from Dr Patty SermonsBrackbill saying medication pt is on is workers comp related.  Employer is trying to stop paying for medication.  He does not remember the name of the medication but says Juliette AlcideMelinda should know.  Please fax note to [phone].

## 2013-12-20 NOTE — Telephone Encounter (Signed)
Faxed letter as requested.

## 2013-12-21 ENCOUNTER — Other Ambulatory Visit: Payer: Self-pay | Admitting: Cardiology

## 2014-01-02 ENCOUNTER — Other Ambulatory Visit: Payer: Self-pay | Admitting: Internal Medicine

## 2014-01-02 NOTE — Telephone Encounter (Signed)
Done erx 

## 2014-01-03 ENCOUNTER — Ambulatory Visit (INDEPENDENT_AMBULATORY_CARE_PROVIDER_SITE_OTHER): Payer: BC Managed Care – PPO | Admitting: Pharmacist

## 2014-01-03 DIAGNOSIS — Z7901 Long term (current) use of anticoagulants: Secondary | ICD-10-CM

## 2014-01-03 DIAGNOSIS — Z5181 Encounter for therapeutic drug level monitoring: Secondary | ICD-10-CM

## 2014-01-03 DIAGNOSIS — I82401 Acute embolism and thrombosis of unspecified deep veins of right lower extremity: Secondary | ICD-10-CM

## 2014-01-03 DIAGNOSIS — I82409 Acute embolism and thrombosis of unspecified deep veins of unspecified lower extremity: Secondary | ICD-10-CM

## 2014-01-03 DIAGNOSIS — I2699 Other pulmonary embolism without acute cor pulmonale: Secondary | ICD-10-CM

## 2014-01-03 LAB — POCT INR: INR: 2

## 2014-01-05 ENCOUNTER — Other Ambulatory Visit: Payer: Self-pay | Admitting: Internal Medicine

## 2014-01-21 ENCOUNTER — Encounter (HOSPITAL_COMMUNITY): Payer: Self-pay | Admitting: Emergency Medicine

## 2014-01-21 ENCOUNTER — Emergency Department (HOSPITAL_COMMUNITY)
Admission: EM | Admit: 2014-01-21 | Discharge: 2014-01-21 | Disposition: A | Discharge status: Home / Self Care | Payer: BC Managed Care – PPO | Attending: Emergency Medicine | Admitting: Emergency Medicine

## 2014-01-21 DIAGNOSIS — Z86718 Personal history of other venous thrombosis and embolism: Secondary | ICD-10-CM | POA: Diagnosis not present

## 2014-01-21 DIAGNOSIS — Z86010 Personal history of colonic polyps: Secondary | ICD-10-CM | POA: Diagnosis not present

## 2014-01-21 DIAGNOSIS — Z86711 Personal history of pulmonary embolism: Secondary | ICD-10-CM | POA: Insufficient documentation

## 2014-01-21 DIAGNOSIS — Z79899 Other long term (current) drug therapy: Secondary | ICD-10-CM | POA: Diagnosis not present

## 2014-01-21 DIAGNOSIS — Z7901 Long term (current) use of anticoagulants: Secondary | ICD-10-CM | POA: Diagnosis not present

## 2014-01-21 DIAGNOSIS — E785 Hyperlipidemia, unspecified: Secondary | ICD-10-CM | POA: Diagnosis not present

## 2014-01-21 DIAGNOSIS — Z880 Allergy status to penicillin: Secondary | ICD-10-CM | POA: Insufficient documentation

## 2014-01-21 DIAGNOSIS — K589 Irritable bowel syndrome without diarrhea: Secondary | ICD-10-CM | POA: Diagnosis not present

## 2014-01-21 DIAGNOSIS — M109 Gout, unspecified: Secondary | ICD-10-CM | POA: Diagnosis not present

## 2014-01-21 DIAGNOSIS — Z862 Personal history of diseases of the blood and blood-forming organs and certain disorders involving the immune mechanism: Secondary | ICD-10-CM | POA: Insufficient documentation

## 2014-01-21 DIAGNOSIS — M79671 Pain in right foot: Secondary | ICD-10-CM | POA: Diagnosis present

## 2014-01-21 DIAGNOSIS — I10 Essential (primary) hypertension: Secondary | ICD-10-CM | POA: Insufficient documentation

## 2014-01-21 DIAGNOSIS — G43909 Migraine, unspecified, not intractable, without status migrainosus: Secondary | ICD-10-CM | POA: Insufficient documentation

## 2014-01-21 MED ORDER — COLCHICINE 0.6 MG PO TABS: 0.6000 mg | ORAL_TABLET | Freq: Two times a day (BID) | ORAL | Status: DC

## 2014-01-21 MED ORDER — COLCHICINE 0.6 MG PO TABS
0.6000 mg | ORAL_TABLET | Freq: Once | ORAL | Status: AC
  Administered 2014-01-21: 0.6 mg via ORAL
  Filled 2014-01-21: qty 1

## 2014-01-21 NOTE — ED Provider Notes (Signed)
CSN: [id number]     Arrival date & time 01/21/14  1014 History   First MD Initiated Contact with Patient 01/21/14 1024     Chief Complaint  Patient presents with  . Foot Pain     (Consider location/radiation/quality/duration/timing/severity/associated sxs/prior Treatment) HPI Mia [name] is a 59 y.o. female with a history of DVT, PE, hypertension, morbid obesity comes in for evaluation of foot pain. Patient states she has had pain in her right ankle and big toe since Thursday. She has taken Tylenol without relief. She rates the pain as a 10/10. Originally the pain started on Wednesday in her right knee, migrated to her right calf and has ended up in her right foot. She denies any pain, redness or swelling in her calf knee or right lower extremity at this time. She denies any fevers, shortness of breath, cough, chest pain. She does take warfarin, reports last had it checked 2 weeks ago. Last point of care INR, 01/03/14  2.0  Past Medical History  Diagnosis Date  . Hypertension   . Morbid obesity   . Nonsmoker   . Pulmonary embolism 2010    following right knee replacement  . DVT (deep venous thrombosis)     right upper extremitiy  . Chronic anticoagulation   . Infected prosthetic knee joint   . H/O: GI bleed   . Anemia   . IBS (irritable bowel syndrome)   . Hyperlipidemia   . WPW (Wolff-Parkinson-White syndrome)   . Anemia, unspecified 04/23/2011  . Diverticulosis   . Colon polyps may 2011  . Allergic rhinitis, cause unspecified 04/29/2011  . Migraine 04/29/2011  . Gout 04/22/2010  . GERD (gastroesophageal reflux disease) 11/10/2011   Past Surgical History  Procedure Laterality Date  . Right knee replacement  2010    followed by I & D for infection  . Cholecystectomy    . Tubal ligation     Family History  Problem Relation Age of Onset  . Colon cancer    . Coronary artery disease     History  Substance Use Topics  . Smoking status: Never Smoker   . Smokeless  tobacco: Never Used  . Alcohol Use: No   OB History    No data available     Review of Systems  All other systems reviewed and are negative. A 10 point review of systems was completed and was negative except for pertinent positives and negatives as mentioned in the history of present illness      Allergies  Amlodipine; Crestor; Piperacillin sod-tazobactam so; and Ultram  Home Medications   Prior to Admission medications   Medication Sig Start Date End Date Taking? Authorizing Provider  atorvastatin (LIPITOR) 40 MG tablet Take 40 mg by mouth daily.   Yes Historical Provider, MD  butalbital-acetaminophen-caffeine (FIORICET, ESGIC) 50-325-40 MG per tablet TAKE 1 TABLET BY MOUTH TWICE DAILY AS NEEDED FOR HEADACHE 06/08/13  Yes Corwin LevinsJames W John, MD  furosemide (LASIX) 40 MG tablet Take 40 mg by mouth daily as needed for fluid.   Yes Historical Provider, MD  guanFACINE (TENEX) 1 MG tablet TAKE 1 TABLET BY MOUTH AT BEDTIME 03/14/13  Yes Cassell Clementhomas Brackbill, MD  lidocaine (LIDODERM) 5 % Place 1 patch onto the skin daily as needed (for pain). Remove & Discard patch within 12 hours or as directed by MD 12/19/12  Yes Corwin LevinsJames W John, MD  Linaclotide [facility](LINZESS) 290 MCG CAPS capsule Take 1 capsule (290 mcg total) by mouth as needed. Patient taking  differently: Take 290 mcg by mouth daily as needed (for constipation).  12/19/13 01/24/15 Yes Cassell Clement, MD  polyethylene glycol powder (GLYCOLAX/MIRALAX) powder TAKE 17 GRAMS BY MOUTH DAILY AS NEEDED FOR CONSTIPATION 05/26/11  Yes Randall Hiss, MD  SUMAtriptan (IMITREX) 100 MG tablet Take 1 tablet (100 mg total) by mouth every 2 (two) hours as needed for migraine. May repeat in 2 hours if headache persists or recurs. 06/19/13  Yes Corwin Levins, MD  telmisartan (MICARDIS) 80 MG tablet TAKE 1 TABLET BY MOUTH DAILY 01/07/14  Yes Corwin Levins, MD  warfarin (COUMADIN) 5 MG tablet Take as directed by anticoagulation clinic Patient taking differently: Take 5-7.5 mg  by mouth daily. Take 5 mg on Monday, Wednesday, Friday, and Saturday.  Take 7.5 mg on Sunday, Tuesday, and Thursday. as directed by anticoagulation clinic 12/21/13  Yes Cassell Clement, MD  colchicine 0.6 MG tablet Take 1 tablet (0.6 mg total) by mouth 2 (two) times daily. 01/21/14   Sharlene Motts, PA-C  furosemide (LASIX) 40 MG tablet TAKE 1 TABLET BY MOUTH DAILY AS NEEDED Patient not taking: Reported on 01/21/2014 09/24/12   Cassell Clement, MD  guanFACINE (TENEX) 1 MG tablet TAKE 1 TABLET BY MOUTH AT BEDTIME Patient not taking: Reported on 01/21/2014 01/02/14   Corwin Levins, MD   BP 140/86 mmHg  Pulse 75  Temp(Src) 97.7 F (36.5 C) (Oral)  Resp 19  Ht  (1.6 m)  Wt 330 lb (149.687 kg)  BMI 58.47 kg/m2  SpO2 97% Physical Exam  Constitutional: She is oriented to person, place, and time. She appears well-developed and well-nourished.  Morbidly obese  HENT:  Head: Normocephalic and atraumatic.  Mouth/Throat: Oropharynx is clear and moist.  Eyes: Conjunctivae are normal. Pupils are equal, round, and reactive to light. Right eye exhibits no discharge. Left eye exhibits no discharge. No scleral icterus.  Neck: Neck supple.  Cardiovascular: Normal rate, regular rhythm, normal heart sounds and intact distal pulses.   Pulmonary/Chest: Effort normal and breath sounds normal. No respiratory distress. She has no wheezes. She has no rales.  Abdominal: Soft. There is no tenderness.  Musculoskeletal: Normal range of motion. She exhibits no edema.  Right first MTP joint is inflamed, erythematous and tender. No tenderness, redness, swelling noted to the ankle, calf, knee or thigh. No tenderness along the deep venous system.  Neurological: She is alert and oriented to person, place, and time.  Cranial Nerves II-XII grossly intact  Skin: Skin is warm and dry. No rash noted.  Psychiatric: She has a normal mood and affect.  Nursing note and vitals reviewed.   ED Course  Procedures (including  critical care time) Labs Review Labs Reviewed - No data to display  Imaging Review No results found.   EKG Interpretation None     Meds given in ED:  Medications  colchicine tablet 0.6 mg (0.6 mg Oral Given 01/21/14 1129)    Discharge Medication List as of 01/21/2014 11:14 AM    START taking these medications   Details  colchicine 0.6 MG tablet Take 1 tablet (0.6 mg total) by mouth 2 (two) times daily., Starting 01/21/2014, Until Discontinued, Print       Filed Vitals:   01/21/14 1025 01/21/14 1045 01/21/14 1115 01/21/14 1130  BP: 165/69 148/93 121/72 140/86  Pulse:  82 81 75  Temp: 97.7 F (36.5 C)     TempSrc: Oral     Resp: 19     Height:  (  1.6 m)     Weight: 330 lb (149.687 kg)     SpO2: 97% 97% 92% 97%    MDM  Vitals stable - WNL -afebrile Pt resting comfortably in ED. PE--inflamed, erythematous and tender first MTP joint on right foot. NVI. DP intact 2+  DDX--patient presents with pain, redness in MTP likely secondary to gout. Low concern for cellulitis or other infection. No evidence of blood clot.  Will DC with colchicine and instructions to follow-up with primary care. I discussed all relevant lab findings and imaging results with pt and they verbalized understanding. Discussed f/u with PCP within 48 hrs and return precautions, pt very amenable to plan.  Prior to patient discharge, I discussed and reviewed this case with Dr. Hyacinth Meeker, who also saw and evaluated the patient  Final diagnoses:  Podagra        Sharlene Motts, PA-C 01/21/14 1808  Vida Roller, MD 01/21/14 2101

## 2014-01-21 NOTE — ED Provider Notes (Signed)
59 year old female who has progressively swollen right  first metatarsophalangeal joint pain of the right foot. It is at the base of the great toe, this is severe, even with very light touch it hurts. She has been unable to ambulate, she denies fevers, she denies any significant redness outside of that area. She denies a history of gout, she has recently had her right knee replaced with multiple complications including blood clots for which she takes Coumadin. On exam she has a swollen tender warm red MTP joint of the first right joint, she has no other significant swelling or redness, no ascending redness, no streaking up the foot or the leg, no fever. Likely acute gouty arthritis, discharge with follow-up.   Medical screening examination/treatment/procedure(s) were conducted as a shared visit with non-physician practitioner(s) and myself.  I personally evaluated the patient during the encounter.  Clinical Impression:   Final diagnoses:  Podagra          Vida RollerBrian D Seiler, MD 01/21/14 2101

## 2014-01-21 NOTE — Discharge Instructions (Signed)
Gout °Gout is an inflammatory arthritis caused by a buildup of uric acid crystals in the joints. Uric acid is a chemical that is normally present in the blood. When the level of uric acid in the blood is too high it can form crystals that deposit in your joints and tissues. This causes joint redness, soreness, and swelling (inflammation). Repeat attacks are common. Over time, uric acid crystals can form into masses (tophi) near a joint, destroying bone and causing disfigurement. Gout is treatable and often preventable. °CAUSES  °The disease begins with elevated levels of uric acid in the blood. Uric acid is produced by your body when it breaks down a naturally found substance called purines. Certain foods you eat, such as meats and fish, contain high amounts of purines. Causes of an elevated uric acid level include: °· Being passed down from parent to child (heredity). °· Diseases that cause increased uric acid production (such as obesity, psoriasis, and certain cancers). °· Excessive alcohol use. °· Diet, especially diets rich in meat and seafood. °· Medicines, including certain cancer-fighting medicines (chemotherapy), water pills (diuretics), and aspirin. °· Chronic kidney disease. The kidneys are no longer able to remove uric acid well. °· Problems with metabolism. °Conditions strongly associated with gout include: °· Obesity. °· High blood pressure. °· High cholesterol. °· Diabetes. °Not everyone with elevated uric acid levels gets gout. It is not understood why some people get gout and others do not. Surgery, joint injury, and eating too much of certain foods are some of the factors that can lead to gout attacks. °SYMPTOMS  °· An attack of gout comes on quickly. It causes intense pain with redness, swelling, and warmth in a joint. °· Fever can occur. °· Often, only one joint is involved. Certain joints are more commonly involved: °· Base of the big toe. °· Knee. °· Ankle. °· Wrist. °· Finger. °Without  treatment, an attack usually goes away in a few days to weeks. Between attacks, you usually will not have symptoms, which is different from many other forms of arthritis. °DIAGNOSIS  °Your caregiver will suspect gout based on your symptoms and exam. In some cases, tests may be recommended. The tests may include: °· Blood tests. °· Urine tests. °· X-rays. °· Joint fluid exam. This exam requires a needle to remove fluid from the joint (arthrocentesis). Using a microscope, gout is confirmed when uric acid crystals are seen in the joint fluid. °TREATMENT  °There are two phases to gout treatment: treating the sudden onset (acute) attack and preventing attacks (prophylaxis). °· Treatment of an Acute Attack. °· Medicines are used. These include anti-inflammatory medicines or steroid medicines. °· An injection of steroid medicine into the affected joint is sometimes necessary. °· The painful joint is rested. Movement can worsen the arthritis. °· You may use warm or cold treatments on painful joints, depending which works best for you. °· Treatment to Prevent Attacks. °· If you suffer from frequent gout attacks, your caregiver may advise preventive medicine. These medicines are started after the acute attack subsides. These medicines either help your kidneys eliminate uric acid from your body or decrease your uric acid production. You may need to stay on these medicines for a very long time. °· The early phase of treatment with preventive medicine can be associated with an increase in acute gout attacks. For this reason, during the first few months of treatment, your caregiver may also advise you to take medicines usually used for acute gout treatment. Be sure you   understand your caregiver's directions. Your caregiver may make several adjustments to your medicine dose before these medicines are effective.  Discuss dietary treatment with your caregiver or dietitian. Alcohol and drinks high in sugar and fructose and foods  such as meat, poultry, and seafood can increase uric acid levels. Your caregiver or dietitian can advise you on drinks and foods that should be limited. HOME CARE INSTRUCTIONS   Do not take aspirin to relieve pain. This raises uric acid levels.  Only take over-the-counter or prescription medicines for pain, discomfort, or fever as directed by your caregiver.  Rest the joint as much as possible. When in bed, keep sheets and blankets off painful areas.  Keep the affected joint raised (elevated).  Apply warm or cold treatments to painful joints. Use of warm or cold treatments depends on which works best for you.  Use crutches if the painful joint is in your leg.  Drink enough fluids to keep your urine clear or pale yellow. This helps your body get rid of uric acid. Limit alcohol, sugary drinks, and fructose drinks.  Follow your dietary instructions. Pay careful attention to the amount of protein you eat. Your daily diet should emphasize fruits, vegetables, whole grains, and fat-free or low-fat milk products. Discuss the use of coffee, vitamin C, and cherries with your caregiver or dietitian. These may be helpful in lowering uric acid levels.  Maintain a healthy body weight. SEEK MEDICAL CARE IF:   You develop diarrhea, vomiting, or any side effects from medicines.  You do not feel better in 24 hours, or you are getting worse. SEEK IMMEDIATE MEDICAL CARE IF:   Your joint becomes suddenly more tender, and you have chills or a fever. MAKE SURE YOU:   Understand these instructions.  Will watch your condition.  Will get help right away if you are not doing well or get worse. Document Released: 12/19/1999 Document Revised: 05/07/2013 Document Reviewed: 08/04/2011 [facility] Patient Information 2015 [place], [place]. This information is not intended to replace advice given to you by your health care provider. Make sure you discuss any questions you have with your health care  provider.  Low-Purine Diet Purines are compounds that affect the level of uric acid in your body. A low-purine diet is a diet that is low in purines. Eating a low-purine diet can prevent the level of uric acid in your body from getting too high and causing gout or kidney stones or both. WHAT DO I NEED TO KNOW ABOUT THIS DIET?  Choose low-purine foods. Examples of low-purine foods are listed in the next section.  Drink plenty of fluids, especially water. Fluids can help remove uric acid from your body. Try to drink 8-16 cups (1.9-3.8 L) a day.  Limit foods high in fat, especially saturated fat, as fat makes it harder for the body to get rid of uric acid. Foods high in saturated fat include pizza, cheese, ice cream, whole milk, fried foods, and gravies. Choose foods that are lower in fat and lean sources of protein. Use olive oil when cooking as it contains healthy fats that are not high in saturated fat.  Limit alcohol. Alcohol interferes with the elimination of uric acid from your body. If you are having a gout attack, avoid all alcohol.  Keep in mind that different people's bodies react differently to different foods. You will probably learn over time which foods do or do not affect you. If you discover that a food tends to cause your gout to flare  up, avoid eating that food. You can more freely enjoy foods that do not cause problems. If you have any questions about a food item, talk to your dietitian or health care provider. WHICH FOODS ARE LOW, MODERATE, AND HIGH IN PURINES? The following is a list of foods that are low, moderate, and high in purines. You can eat any amount of the foods that are low in purines. You may be able to have small amounts of foods that are moderate in purines. Ask your health care provider how much of a food moderate in purines you can have. Avoid foods high in purines. Grains  Foods low in purines: Enriched white bread, pasta, rice, cake, cornbread, popcorn.  Foods  moderate in purines: Whole-grain breads and cereals, wheat germ, bran, oatmeal. Uncooked oatmeal. Dry wheat bran or wheat germ.  Foods high in purines: Pancakes, [place] toast, biscuits, muffins. Vegetables  Foods low in purines: All vegetables, except those that are moderate in purines.  Foods moderate in purines: Asparagus, cauliflower, spinach, mushrooms, green peas. Fruits  All fruits are low in purines. Meats and other Protein Foods  Foods low in purines: Eggs, nuts, peanut butter.  Foods moderate in purines: 80-90% lean beef, lamb, veal, pork, poultry, fish, eggs, peanut butter, nuts. Crab, lobster, oysters, and shrimp. Cooked dried beans, peas, and lentils.  Foods high in purines: Anchovies, sardines, herring, mussels, tuna, codfish, scallops, trout, and haddock. Tomasa BlaseBacon. Organ meats (such as liver or kidney). Tripe. Game meat. Goose. Sweetbreads. Dairy  All dairy foods are low in purines. Low-fat and fat-free dairy products are best because they are low in saturated fat. Beverages  Drinks low in purines: Water, carbonated beverages, tea, coffee, cocoa.  Drinks moderate in purines: Soft drinks and other drinks sweetened with high-fructose corn syrup. Juices. To find whether a food or drink is sweetened with high-fructose corn syrup, look at the ingredients list.  Drinks high in purines: Alcoholic beverages (such as beer). Condiments  Foods low in purines: Salt, herbs, olives, pickles, relishes, vinegar.  Foods moderate in purines: Butter, margarine, oils, mayonnaise. Fats and Oils  Foods low in purines: All types, except gravies and sauces made with meat.  Foods high in purines: Gravies and sauces made with meat. Other Foods  Foods low in purines: Sugars, sweets, gelatin. Cake. Soups made without meat.  Foods moderate in purines: Meat-based or fish-based soups, broths, or bouillons. Foods and drinks sweetened with high-fructose corn syrup.  Foods high in purines:  High-fat desserts (such as ice cream, cookies, cakes, pies, doughnuts, and chocolate). Contact your dietitian for more information on foods that are not listed here. Document Released: 04/17/2010 Document Revised: 12/26/2012 Document Reviewed: 11/27/2012 [facility] Patient Information 2015 [place]. This information is not intended to replace advice given to you by your health care provider. Make sure you discuss any questions you have with your health care provider.   You were evaluated in the ED today for your toe pain, there does not appear to be an emergent cause for your symptoms at this time. It is important to follow up with your primary care provider within 48 hours for further evaluation and management of your toe pain. He may take your medicines as prescribed. Return to ED for worsening symptoms, fevers, calf or leg pain, cough, shortness of breath, chest pain

## 2014-01-21 NOTE — ED Notes (Addendum)
Pt states she started having pain in her right knee last Wednesday. The pain has progressed into her right calf, and now is in her foot. Pt states her foot feels swollen and at times feels hot. Pt able to walk, but it is very painful. Pt has hx of blood clots. Pt had knee replacement in that knee on November 14, 2009. Pt is on blood thinners

## 2014-01-24 ENCOUNTER — Ambulatory Visit (INDEPENDENT_AMBULATORY_CARE_PROVIDER_SITE_OTHER): Payer: BC Managed Care – PPO | Admitting: Internal Medicine

## 2014-01-24 ENCOUNTER — Encounter: Payer: Self-pay | Admitting: Internal Medicine

## 2014-01-24 VITALS — BP 116/80 | HR 83 | Temp 98.1°F | Wt 344.0 lb

## 2014-01-24 DIAGNOSIS — M109 Gout, unspecified: Secondary | ICD-10-CM | POA: Insufficient documentation

## 2014-01-24 DIAGNOSIS — H9201 Otalgia, right ear: Secondary | ICD-10-CM

## 2014-01-24 DIAGNOSIS — I10 Essential (primary) hypertension: Secondary | ICD-10-CM

## 2014-01-24 DIAGNOSIS — M79661 Pain in right lower leg: Secondary | ICD-10-CM

## 2014-01-24 MED ORDER — AZITHROMYCIN 250 MG PO TABS: ORAL_TABLET | ORAL | Status: DC

## 2014-01-24 MED ORDER — PREDNISONE 10 MG PO TABS: ORAL_TABLET | ORAL | Status: DC

## 2014-01-24 MED ORDER — METHYLPREDNISOLONE ACETATE 80 MG/ML IJ SUSP
120.0000 mg | Freq: Once | INTRAMUSCULAR | Status: AC
  Administered 2014-01-24: 120 mg via INTRAMUSCULAR

## 2014-01-24 NOTE — Progress Notes (Signed)
Subjective:    Patient ID: Mia GradCatherine J Heckman, female    DOB: Jun 16, 1955, 59 y.o.   MRN: [id number]  HPI  Here to f/u right first great MTP swelling/red/tender/pain, acute onset severe onset seen in recent ER visit  - Jan 18. Tx with colchciine tx for presumed acute gouty arthritis, initial pain 9./10, now 5/10 and only about 50% improved.  Also incidentally  Here with 2-3 days acute onset fever, right ear pain, pressure, headache, general weakness and malaise, but pt denies chest pain, wheezing, increased sob or doe, orthopnea, PND, increased LE swelling, palpitations, dizziness or syncope. Also incidentally with a tender new firm area subq right post calf in the last day, has ongoing LE edema but does not think swelling any worse. No trauma or overlying skin change. Pt concerned about DVT recurrent , also INR has been labile it seems recently and hard to regulate between 2-3.  Dec 31 INR 2.0 Past Medical History  Diagnosis Date  . Hypertension   . Morbid obesity   . Nonsmoker   . Pulmonary embolism 2010    following right knee replacement  . DVT (deep venous thrombosis)     right upper extremitiy  . Chronic anticoagulation   . Infected prosthetic knee joint   . H/O: GI bleed   . Anemia   . IBS (irritable bowel syndrome)   . Hyperlipidemia   . WPW (Wolff-Parkinson-White syndrome)   . Anemia, unspecified 04/23/2011  . Diverticulosis   . Colon polyps may 2011  . Allergic rhinitis, cause unspecified 04/29/2011  . Migraine 04/29/2011  . Gout 04/22/2010  . GERD (gastroesophageal reflux disease) 11/10/2011   Past Surgical History  Procedure Laterality Date  . Right knee replacement  2010    followed by [facility] & D for infection  . Cholecystectomy    . Tubal ligation      reports that she has never smoked. She has never used smokeless tobacco. She reports that she does not drink alcohol or use illicit drugs. family history includes Colon cancer in an other family member; Coronary artery disease  in an other family member. Allergies  Allergen Reactions  . Amlodipine     Edema,ha  . Crestor [Rosuvastatin Calcium]     Chest pain  . Piperacillin Sod-Tazobactam So   . Ultram [Tramadol]     nausea   Current Outpatient Prescriptions on File Prior to Visit  Medication Sig Dispense Refill  . atorvastatin (LIPITOR) 40 MG tablet Take 40 mg by mouth daily.    . butalbital-acetaminophen-caffeine (FIORICET, ESGIC) 50-325-40 MG per tablet TAKE 1 TABLET BY MOUTH TWICE DAILY AS NEEDED FOR HEADACHE 14 tablet 0  . colchicine 0.6 MG tablet Take 1 tablet (0.6 mg total) by mouth 2 (two) times daily. 8 tablet 0  . furosemide (LASIX) 40 MG tablet TAKE 1 TABLET BY MOUTH DAILY AS NEEDED 90 tablet 0  . furosemide (LASIX) 40 MG tablet Take 40 mg by mouth daily as needed for fluid.    Marland Kitchen. guanFACINE (TENEX) 1 MG tablet TAKE 1 TABLET BY MOUTH AT BEDTIME 90 tablet 3  . guanFACINE (TENEX) 1 MG tablet TAKE 1 TABLET BY MOUTH AT BEDTIME 90 tablet 1  . lidocaine (LIDODERM) 5 % Place 1 patch onto the skin daily as needed (for pain). Remove & Discard patch within 12 hours or as directed by MD    . Linaclotide (LINZESS) 290 MCG CAPS capsule Take 1 capsule (290 mcg total) by mouth as needed. (Patient taking differently:  Take 290 mcg by mouth daily as needed (for constipation). ) 30 capsule 3  . polyethylene glycol powder (GLYCOLAX/MIRALAX) powder TAKE 17 GRAMS BY MOUTH DAILY AS NEEDED FOR CONSTIPATION 527 g 6  . SUMAtriptan (IMITREX) 100 MG tablet Take 1 tablet (100 mg total) by mouth every 2 (two) hours as needed for migraine. May repeat in 2 hours if headache persists or recurs. 10 tablet 11  . telmisartan (MICARDIS) 80 MG tablet TAKE 1 TABLET BY MOUTH DAILY 90 tablet 1  . warfarin (COUMADIN) 5 MG tablet Take as directed by anticoagulation clinic (Patient taking differently: Take 5-7.5 mg by mouth daily. Take 5 mg on Monday, Wednesday, Friday, and Saturday.  Take 7.5 mg on Sunday, Tuesday, and Thursday. as directed by  anticoagulation clinic) 60 tablet 2   No current facility-administered medications on file prior to visit.   Review of Systems  Constitutional: Negative for unusual diaphoresis or other sweats  HENT: Negative for ringing in ear Eyes: Negative for double vision or worsening visual disturbance.  Respiratory: Negative for choking and stridor.   Gastrointestinal: Negative for vomiting or other signifcant bowel change Genitourinary: Negative for hematuria or decreased urine volume.  Musculoskeletal: Negative for other MSK pain or swelling Skin: Negative for color change and worsening wound.  Neurological: Negative for tremors and numbness other than noted  Psychiatric/Behavioral: Negative for decreased concentration or agitation other than above       Objective:   Physical Exam BP 116/80 mmHg  Pulse 83  Temp(Src) 98.1 F (36.7 C) (Oral)  Wt 344 lb (156.037 kg)  SpO2 96% VS noted, mild ill Constitutional: Pt appears well-developed, well-nourished. /morbid obese HENT: Head: NCAT.  Right Ear: External ear normal.  Left Ear: External ear normal.  Bilat tm's with mild erythema right > left  Max sinus areas non tender.  Pharynx with mild erythema, no exudate Eyes: . Pupils are equal, round, and reactive to light. Conjunctivae and EOM are normal Neck: Normal range of motion. Neck supple.  Cardiovascular: Normal rate and regular rhythm.   Pulmonary/Chest: Effort normal and breath sounds without rales or wheezing.  Neurological: Pt is alert. Not confused , motor grossly intact Skin: right first MTP with 1-2+ red/tender/swelling Right post calf with approx 1 cm area subq firm area, mild tender without overlying skin change or bruising Bilat extrem trace -1+ edema Psychiatric: Pt behavior is normal. No agitation.      Assessment & Plan:

## 2014-01-24 NOTE — Patient Instructions (Addendum)
You had the steroid shot today  Please take all new medication as prescribed - the prednisone, and the antibiotic  Please continue all other medications as before, and refills have been done if requested.  Please have the pharmacy call with any other refills you may need.  Please keep your appointments with your specialists as you may have planned

## 2014-01-24 NOTE — Progress Notes (Signed)
Pre visit review using our clinic review tool, if applicable. No additional management support is needed unless otherwise documented below in the visit note. 

## 2014-01-25 DIAGNOSIS — M79661 Pain in right lower leg: Secondary | ICD-10-CM | POA: Insufficient documentation

## 2014-01-25 DIAGNOSIS — H9201 Otalgia, right ear: Secondary | ICD-10-CM | POA: Insufficient documentation

## 2014-01-25 NOTE — Assessment & Plan Note (Signed)
Mild to mod, for depomedrol IM, predpac asd, colchicine prn, to f/u any worsening symptoms or concerns

## 2014-01-25 NOTE — Assessment & Plan Note (Signed)
stable overall by history and exam, recent data reviewed with pt, and pt to continue medical treatment as before,  to f/u any worsening symptoms or concerns BP Readings from Last 3 Encounters:  01/24/14 116/80  01/21/14 140/86  12/19/13 168/100

## 2014-01-25 NOTE — Assessment & Plan Note (Signed)
Cant r/o early otitis media, for zpack x 1,  to f/u any worsening symptoms or concerns , also mucinex otc prn

## 2014-01-25 NOTE — Assessment & Plan Note (Signed)
Small firm tender area focal right post calf, c/w likely small area superfic phlebitis vs small subq hematoma , either likely related to up and down INR or small trauma, ok to follow for now for any worsening, tylenol prn

## 2014-02-12 ENCOUNTER — Telehealth: Payer: Self-pay | Admitting: Internal Medicine

## 2014-02-12 MED ORDER — IMITREX 100 MG PO TABS: 100.0000 mg | ORAL_TABLET | ORAL | Status: DC | PRN

## 2014-02-12 NOTE — Telephone Encounter (Signed)
Name brand rx sent in electronically

## 2014-02-12 NOTE — Telephone Encounter (Signed)
Is requesting to be taken off sumatriptan bc she believes it is making her chest hurt and she would like to be put back on the name brand imitrex b/c she states it did not.  Patient uses [facility] on [address].

## 2014-02-13 ENCOUNTER — Other Ambulatory Visit: Payer: Self-pay | Admitting: *Deleted

## 2014-02-13 MED ORDER — IMITREX 100 MG PO TABS: 100.0000 mg | ORAL_TABLET | ORAL | Status: DC | PRN

## 2014-02-14 ENCOUNTER — Ambulatory Visit (INDEPENDENT_AMBULATORY_CARE_PROVIDER_SITE_OTHER): Payer: BC Managed Care – PPO | Admitting: Pharmacist

## 2014-02-14 DIAGNOSIS — Z7901 Long term (current) use of anticoagulants: Secondary | ICD-10-CM

## 2014-02-14 DIAGNOSIS — Z5181 Encounter for therapeutic drug level monitoring: Secondary | ICD-10-CM

## 2014-02-14 DIAGNOSIS — I82401 Acute embolism and thrombosis of unspecified deep veins of right lower extremity: Secondary | ICD-10-CM

## 2014-02-14 DIAGNOSIS — I2699 Other pulmonary embolism without acute cor pulmonale: Secondary | ICD-10-CM

## 2014-02-14 DIAGNOSIS — I82409 Acute embolism and thrombosis of unspecified deep veins of unspecified lower extremity: Secondary | ICD-10-CM

## 2014-02-14 LAB — POCT INR: INR: 2.4

## 2014-02-18 ENCOUNTER — Other Ambulatory Visit: Payer: Self-pay | Admitting: Cardiology

## 2014-03-06 ENCOUNTER — Ambulatory Visit: Payer: Self-pay | Admitting: Cardiology

## 2014-03-16 ENCOUNTER — Other Ambulatory Visit: Payer: Self-pay | Admitting: Cardiology

## 2014-03-16 DIAGNOSIS — K5901 Slow transit constipation: Secondary | ICD-10-CM

## 2014-03-28 ENCOUNTER — Ambulatory Visit (INDEPENDENT_AMBULATORY_CARE_PROVIDER_SITE_OTHER): Payer: BC Managed Care – PPO

## 2014-03-28 DIAGNOSIS — I82409 Acute embolism and thrombosis of unspecified deep veins of unspecified lower extremity: Secondary | ICD-10-CM

## 2014-03-28 DIAGNOSIS — I2699 Other pulmonary embolism without acute cor pulmonale: Secondary | ICD-10-CM | POA: Diagnosis not present

## 2014-03-28 DIAGNOSIS — Z7901 Long term (current) use of anticoagulants: Secondary | ICD-10-CM | POA: Diagnosis not present

## 2014-03-28 DIAGNOSIS — Z5181 Encounter for therapeutic drug level monitoring: Secondary | ICD-10-CM

## 2014-03-28 LAB — POCT INR: INR: 1.5

## 2014-04-18 ENCOUNTER — Ambulatory Visit (INDEPENDENT_AMBULATORY_CARE_PROVIDER_SITE_OTHER): Payer: BC Managed Care – PPO | Admitting: Pharmacist

## 2014-04-18 DIAGNOSIS — I82409 Acute embolism and thrombosis of unspecified deep veins of unspecified lower extremity: Secondary | ICD-10-CM | POA: Diagnosis not present

## 2014-04-18 DIAGNOSIS — I2699 Other pulmonary embolism without acute cor pulmonale: Secondary | ICD-10-CM

## 2014-04-18 DIAGNOSIS — Z7901 Long term (current) use of anticoagulants: Secondary | ICD-10-CM | POA: Diagnosis not present

## 2014-04-18 DIAGNOSIS — Z5181 Encounter for therapeutic drug level monitoring: Secondary | ICD-10-CM | POA: Diagnosis not present

## 2014-04-18 LAB — POCT INR: INR: 2.8

## 2014-04-30 ENCOUNTER — Ambulatory Visit (INDEPENDENT_AMBULATORY_CARE_PROVIDER_SITE_OTHER): Payer: Worker's Compensation | Admitting: Cardiology

## 2014-04-30 ENCOUNTER — Encounter: Payer: Self-pay | Admitting: Cardiology

## 2014-04-30 VITALS — BP 138/88 | HR 79 | Ht 63.5 in | Wt 339.8 lb

## 2014-04-30 DIAGNOSIS — I456 Pre-excitation syndrome: Secondary | ICD-10-CM | POA: Diagnosis not present

## 2014-04-30 DIAGNOSIS — Z7901 Long term (current) use of anticoagulants: Secondary | ICD-10-CM

## 2014-04-30 DIAGNOSIS — E7800 Pure hypercholesterolemia, unspecified: Secondary | ICD-10-CM

## 2014-04-30 DIAGNOSIS — I119 Hypertensive heart disease without heart failure: Secondary | ICD-10-CM | POA: Diagnosis not present

## 2014-04-30 NOTE — Patient Instructions (Signed)
Medication Instructions:  Your physician recommends that you continue on your current medications as directed. Please refer to the Current Medication list given to you today.  Labwork: LP/BMET/HPF/CBC  Testing/Procedures: NONE  Follow-Up: Your physician recommends that you schedule a follow-up appointment in: 3 MONTH OV

## 2014-04-30 NOTE — Progress Notes (Signed)
Cardiology Office Note   Date:  04/30/2014   ID:  Mia, [name] [birth year], MRN [id number]  PCP:  Oliver Barre, MD  Cardiologist:   Cassell Clement, MD   No chief complaint on file.     History of Present Illness: Mia [name] is a 59 y.o. female who presents for 3 month follow-up office visit  This pleasant 59 year old African American woman is seen for a three-month followup office visit. She has a history of morbid obesity and a complex past medical history which includes deep vein thrombosis and previous pulmonary emboli. She has been on long-term Coumadin. She also has essential hypertension and hypercholesterolemia. She has osteoarthritis and has had a previously infected right knee prosthesis and is followed also by infectious disease and by her orthopedist. Her orthopedist has told her however that he would not consider reoperation for 5 years. Her orthopedist recently put her on permanent disability. . On a previous visit here her electrocardiogram showed a pattern of WPW although the patient has never had any symptoms from this. However a subsequent electrocardiogram on her subsequent office visit did not show any evidence of WPW.  She continues to have problems with morbid obesity and with severe constipation. She has been under more stress recently.  Her 83-year-old grandson is being treated for childhood cancer at [facility]. The patient has not been having any recent chest pain.  She has occasional left arm pain she has permanent chronic pain in the right knee.  She has not been having any symptoms of recurrent coronary emboli.  Her weight is down 5 pounds since last visit.  Past Medical History  Diagnosis Date  . Hypertension   . Morbid obesity   . Nonsmoker   . Pulmonary embolism 2010    following right knee replacement  . DVT (deep venous thrombosis)     right upper extremitiy  . Chronic anticoagulation   . Infected prosthetic  knee joint   . H/O: GI bleed   . Anemia   . IBS (irritable bowel syndrome)   . Hyperlipidemia   . WPW (Wolff-Parkinson-White syndrome)   . Anemia, unspecified 04/23/2011  . Diverticulosis   . Colon polyps may 2011  . Allergic rhinitis, cause unspecified 04/29/2011  . Migraine 04/29/2011  . Gout 04/22/2010  . GERD (gastroesophageal reflux disease) 11/10/2011    Past Surgical History  Procedure Laterality Date  . Right knee replacement  2010    followed by I & D for infection  . Cholecystectomy    . Tubal ligation       Current Outpatient Prescriptions  Medication Sig Dispense Refill  . atorvastatin (LIPITOR) 40 MG tablet Take 40 mg by mouth daily.    . butalbital-acetaminophen-caffeine (FIORICET, ESGIC) 50-325-40 MG per tablet TAKE 1 TABLET BY MOUTH TWICE DAILY AS NEEDED FOR HEADACHE 14 tablet 0  . colchicine 0.6 MG tablet Take 1 tablet (0.6 mg total) by mouth 2 (two) times daily. 8 tablet 0  . furosemide (LASIX) 40 MG tablet Take 40 mg by mouth daily as needed for fluid.    . furosemide (LASIX) 40 MG tablet Take 40 mg by mouth as needed (for swelling or fluid).    Marland Kitchen guanFACINE (TENEX) 1 MG tablet TAKE 1 TABLET BY MOUTH AT BEDTIME 90 tablet 3  . IMITREX 100 MG tablet Take 1 tablet (100 mg total) by mouth every 2 (two) hours as needed for migraine or headache. May repeat in 2 hours if headache  persists or recurs. 10 tablet 11  . lidocaine (LIDODERM) 5 % Place 1 patch onto the skin daily as needed (for pain). Remove & Discard patch within 12 hours or as directed by MD    . Linaclotide (LINZESS) 290 MCG CAPS capsule Take 290 mcg by mouth 2 (two) times a week.    Marland Kitchen. LINZESS 290 MCG CAPS capsule TAKE ONE CAPSULE BY MOUTH EVERY DAY 90 capsule 0  . polyethylene glycol powder (GLYCOLAX/MIRALAX) powder TAKE 17 GRAMS BY MOUTH DAILY AS NEEDED FOR CONSTIPATION 527 g 6  . telmisartan (MICARDIS) 80 MG tablet TAKE 1 TABLET BY MOUTH DAILY 90 tablet 1  . warfarin (COUMADIN) 5 MG tablet Take as directed  by anticoagulation clinic (Patient taking differently: Take 5-7.5 mg by mouth daily. Take 5 mg on Monday, Wednesday, Friday, and Saturday.  Take 7.5 mg on Sunday, Tuesday, and Thursday. as directed by anticoagulation clinic) 60 tablet 2   No current facility-administered medications for this visit.    Allergies:   Amlodipine; Crestor; Piperacillin sod-tazobactam so; and Ultram    Social History:  The patient  reports that she has never smoked. She has never used smokeless tobacco. She reports that she does not drink alcohol or use illicit drugs.   Family History:  The patient's family history includes Colon cancer in an other family member; Coronary artery disease in an other family member; Hypertension in her brother, sister, sister, and sister; Pancreatic cancer in her sister.    ROS:  Please see the history of present illness.   Otherwise, review of systems are positive for none.   All other systems are reviewed and negative.    PHYSICAL EXAM: VS:  BP 138/88 mmHg  Pulse 79  Ht 5' 3.5" (1.613 m)  Wt 339 lb 12.8 oz (154.132 kg)  BMI 59.24 kg/m2 , BMI Body mass index is 59.24 kg/(m^2). GEN: Well nourished, well developed, in no acute distress HEENT: normal Neck: no JVD, carotid bruits, or masses Cardiac: RRR; no murmurs, rubs, or gallops,no edema  Respiratory:  clear to auscultation bilaterally, normal work of breathing GI: soft, nontender, nondistended, + BS MS: no deformity or atrophy Skin: warm and dry, no rash Neuro:  Strength and sensation are intact Psych: euthymic mood, full affect   EKG:  EKG is not ordered today.    Recent Labs: 06/19/2013: ALT 16; BUN 11; Creatinine 0.6; Hemoglobin 14.8; Platelets 213.0; Potassium 3.8; Sodium 139; TSH 1.05    Lipid Panel    Component Value Date/Time   CHOL 199 06/19/2013 1539   TRIG 145.0 06/19/2013 1539   HDL 43.10 06/19/2013 1539   CHOLHDL 5 06/19/2013 1539   VLDL 29.0 06/19/2013 1539   LDLCALC 127* 06/19/2013 1539    LDLDIRECT 148.9 12/11/2012 0934      Wt Readings from Last 3 Encounters:  04/30/14 339 lb 12.8 oz (154.132 kg)  01/24/14 344 lb (156.037 kg)  01/21/14 330 lb (149.687 kg)       ASSESSMENT AND PLAN:  1. WPW, intermittent, asymptomatic. 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 Hypercholesterolemia 4. Past history of deep vein thrombosis and pulmonary emboli, on long-term Coumadin. 5. morbid obesity 6. severe constipation  Plan: Continue same medication. Today we are checking CBC lipid panel hepatic function panel and basal metabolic panel. Recheck in 3 months for follow-up office visit   Current medicines are reviewed at length with the patient today.  The patient does not have concerns regarding medicines.  The following changes have been  made:  no change  Labs/ tests ordered today include:  No orders of the defined types were placed in this encounter.       Signed, Cassell Clement, MD  04/30/2014 6:01 PM    [facility] [address] [place], [place], [place]  [address] Phone: [phone]; Fax: [phone]

## 2014-05-01 LAB — BASIC METABOLIC PANEL
BUN: 16 mg/dL (ref 6–23)
CALCIUM: 9.3 mg/dL (ref 8.4–10.5)
CHLORIDE: 104 meq/L (ref 96–112)
CO2: 31 meq/L (ref 19–32)
Creatinine, Ser: 1.18 mg/dL (ref 0.40–1.20)
GFR: 60.8 mL/min (ref >60.00)
Glucose, Bld: 83 mg/dL (ref 70–99)
Potassium: 3.8 mEq/L (ref 3.5–5.1)
SODIUM: 141 meq/L (ref 135–145)

## 2014-05-01 LAB — CBC WITH DIFFERENTIAL/PLATELET
BASOS PCT: 1.1 % (ref 0.0–3.0)
Basophils Absolute: 0.1 10*3/uL (ref 0.0–0.1)
EOS ABS: 0 10*3/uL (ref 0.0–0.7)
EOS PCT: 0.5 % (ref 0.0–5.0)
HCT: 44.6 % (ref 36.0–46.0)
HEMOGLOBIN: 14.7 g/dL (ref 12.0–15.0)
LYMPHS PCT: 25.3 % (ref 12.0–46.0)
Lymphs Abs: 2.1 10*3/uL (ref 0.7–4.0)
MCHC: 33 g/dL (ref 30.0–36.0)
MCV: 83.5 fl (ref 78.0–100.0)
Monocytes Absolute: 0.8 10*3/uL (ref 0.1–1.0)
Monocytes Relative: 9.5 % (ref 3.0–12.0)
NEUTROS ABS: 5.2 10*3/uL (ref 1.4–7.7)
Neutrophils Relative %: 63.6 % (ref 43.0–77.0)
Platelets: 240 10*3/uL (ref 150.0–400.0)
RBC: 5.34 Mil/uL — ABNORMAL HIGH (ref 3.87–5.11)
RDW: 16 % — ABNORMAL HIGH (ref 11.5–15.5)
WBC: 8.2 10*3/uL (ref 4.0–10.5)

## 2014-05-01 LAB — HEPATIC FUNCTION PANEL
ALK PHOS: 73 U/L (ref 39–117)
ALT: 11 U/L (ref 0–35)
AST: 12 U/L (ref 0–37)
Albumin: 3.7 g/dL (ref 3.5–5.2)
Bilirubin, Direct: 0.1 mg/dL (ref 0.0–0.3)
Total Bilirubin: 0.5 mg/dL (ref 0.2–1.2)
Total Protein: 7.3 g/dL (ref 6.0–8.3)

## 2014-05-01 LAB — LIPID PANEL
Cholesterol: 246 mg/dL — ABNORMAL HIGH (ref 0–200)
HDL: 42.1 mg/dL (ref >39.00)
LDL CALC: 179 mg/dL — AB (ref 0–99)
NONHDL: 203.9
Total CHOL/HDL Ratio: 6
Triglycerides: 123 mg/dL (ref 0.0–149.0)
VLDL: 24.6 mg/dL (ref 0.0–40.0)

## 2014-05-13 ENCOUNTER — Emergency Department (HOSPITAL_COMMUNITY): Payer: BC Managed Care – PPO

## 2014-05-13 ENCOUNTER — Emergency Department (HOSPITAL_COMMUNITY)
Admission: EM | Admit: 2014-05-13 | Discharge: 2014-05-14 | Disposition: A | Discharge status: Home / Self Care | Payer: BC Managed Care – PPO | Attending: Emergency Medicine | Admitting: Emergency Medicine

## 2014-05-13 DIAGNOSIS — E785 Hyperlipidemia, unspecified: Secondary | ICD-10-CM | POA: Diagnosis not present

## 2014-05-13 DIAGNOSIS — Z86711 Personal history of pulmonary embolism: Secondary | ICD-10-CM | POA: Diagnosis not present

## 2014-05-13 DIAGNOSIS — S40011A Contusion of right shoulder, initial encounter: Secondary | ICD-10-CM | POA: Insufficient documentation

## 2014-05-13 DIAGNOSIS — G43909 Migraine, unspecified, not intractable, without status migrainosus: Secondary | ICD-10-CM | POA: Insufficient documentation

## 2014-05-13 DIAGNOSIS — Y999 Unspecified external cause status: Secondary | ICD-10-CM | POA: Insufficient documentation

## 2014-05-13 DIAGNOSIS — Z862 Personal history of diseases of the blood and blood-forming organs and certain disorders involving the immune mechanism: Secondary | ICD-10-CM | POA: Insufficient documentation

## 2014-05-13 DIAGNOSIS — S40021A Contusion of right upper arm, initial encounter: Secondary | ICD-10-CM

## 2014-05-13 DIAGNOSIS — Z7901 Long term (current) use of anticoagulants: Secondary | ICD-10-CM | POA: Diagnosis not present

## 2014-05-13 DIAGNOSIS — S4991XA Unspecified injury of right shoulder and upper arm, initial encounter: Secondary | ICD-10-CM | POA: Diagnosis present

## 2014-05-13 DIAGNOSIS — Z8719 Personal history of other diseases of the digestive system: Secondary | ICD-10-CM | POA: Insufficient documentation

## 2014-05-13 DIAGNOSIS — Z86010 Personal history of colonic polyps: Secondary | ICD-10-CM | POA: Diagnosis not present

## 2014-05-13 DIAGNOSIS — Y939 Activity, unspecified: Secondary | ICD-10-CM | POA: Insufficient documentation

## 2014-05-13 DIAGNOSIS — I10 Essential (primary) hypertension: Secondary | ICD-10-CM | POA: Diagnosis not present

## 2014-05-13 DIAGNOSIS — Y92524 Gas station as the place of occurrence of the external cause: Secondary | ICD-10-CM | POA: Diagnosis not present

## 2014-05-13 DIAGNOSIS — S8002XA Contusion of left knee, initial encounter: Secondary | ICD-10-CM | POA: Diagnosis not present

## 2014-05-13 DIAGNOSIS — W19XXXA Unspecified fall, initial encounter: Secondary | ICD-10-CM

## 2014-05-13 DIAGNOSIS — Z79899 Other long term (current) drug therapy: Secondary | ICD-10-CM | POA: Diagnosis not present

## 2014-05-13 DIAGNOSIS — Z8739 Personal history of other diseases of the musculoskeletal system and connective tissue: Secondary | ICD-10-CM | POA: Diagnosis not present

## 2014-05-13 DIAGNOSIS — IMO0001 Reserved for inherently not codable concepts without codable children: Secondary | ICD-10-CM

## 2014-05-13 DIAGNOSIS — W010XXA Fall on same level from slipping, tripping and stumbling without subsequent striking against object, initial encounter: Secondary | ICD-10-CM | POA: Insufficient documentation

## 2014-05-13 DIAGNOSIS — Z86718 Personal history of other venous thrombosis and embolism: Secondary | ICD-10-CM | POA: Insufficient documentation

## 2014-05-13 MED ORDER — OXYCODONE-ACETAMINOPHEN 5-325 MG PO TABS
2.0000 | ORAL_TABLET | Freq: Once | ORAL | Status: AC
  Administered 2014-05-13: 2 via ORAL
  Filled 2014-05-13: qty 2

## 2014-05-13 MED ORDER — HYDROCODONE-ACETAMINOPHEN 5-325 MG PO TABS: 2.0000 | ORAL_TABLET | Freq: Four times a day (QID) | ORAL | Status: AC | PRN

## 2014-05-13 MED ORDER — HYDROMORPHONE HCL 1 MG/ML IJ SOLN
1.0000 mg | Freq: Once | INTRAMUSCULAR | Status: AC
  Administered 2014-05-13: 1 mg via INTRAMUSCULAR
  Filled 2014-05-13: qty 1

## 2014-05-13 NOTE — ED Notes (Signed)
Informed Dr.Wolfe family requesting an update

## 2014-05-13 NOTE — ED Notes (Signed)
Pt c/o abrasion to L knee and L foot and pain to right shoulder. Mild redness noted to R shoulder  Reports tripping at a gas station and falling onto the ground. Denies LOC.

## 2014-05-13 NOTE — Discharge Instructions (Signed)
Contusion °A contusion is a deep bruise. Contusions happen when an injury causes bleeding under the skin. Signs of bruising include pain, puffiness (swelling), and discolored skin. The contusion may turn blue, purple, or yellow. °HOME CARE  °· Put ice on the injured area. °¨ Put ice in a plastic bag. °¨ Place a towel between your skin and the bag. °¨ Leave the ice on for 15-20 minutes, 03-04 times a day. °· Only take medicine as told by your doctor. °· Rest the injured area. °· If possible, raise (elevate) the injured area to lessen puffiness. °GET HELP RIGHT AWAY IF:  °· You have more bruising or puffiness. °· You have pain that is getting worse. °· Your puffiness or pain is not helped by medicine. °MAKE SURE YOU:  °· Understand these instructions. °· Will watch your condition. °· Will get help right away if you are not doing well or get worse. °Document Released: 06/09/2007 Document Revised: 03/15/2011 Document Reviewed: 10/26/2010 °[employer]® Patient Information ©2015 [employer]. This information is not intended to replace advice given to you by your health care provider. Make sure you discuss any questions you have with your health care provider. ° °

## 2014-05-13 NOTE — ED Provider Notes (Signed)
CSN: [id number]     Arrival date & time 05/13/14  2019 History   First MD Initiated Contact with Patient 05/13/14 2023     Chief Complaint  Patient presents with  . Shoulder Pain  . Fall     (Consider location/radiation/quality/duration/timing/severity/associated sxs/prior Treatment) Patient is a 59 y.o. female presenting with fall. The history is provided by the patient.  Fall This is a new problem. The current episode started today. The problem occurs constantly. The problem has been unchanged. Pertinent negatives include no abdominal pain, chest pain, coughing, diaphoresis, headaches, nausea, neck pain, numbness, rash, vertigo, visual change, vomiting or weakness. Associated symptoms comments: R shoulder and L knee pain. The symptoms are aggravated by bending. She has tried nothing for the symptoms. The treatment provided no relief.    Past Medical History  Diagnosis Date  . Hypertension   . Morbid obesity   . Nonsmoker   . Pulmonary embolism 2010    following right knee replacement  . DVT (deep venous thrombosis)     right upper extremitiy  . Chronic anticoagulation   . Infected prosthetic knee joint   . H/O: GI bleed   . Anemia   . IBS (irritable bowel syndrome)   . Hyperlipidemia   . WPW (Wolff-Parkinson-White syndrome)   . Anemia, unspecified 04/23/2011  . Diverticulosis   . Colon polyps may 2011  . Allergic rhinitis, cause unspecified 04/29/2011  . Migraine 04/29/2011  . Gout 04/22/2010  . GERD (gastroesophageal reflux disease) 11/10/2011   Past Surgical History  Procedure Laterality Date  . Right knee replacement  2010    followed by I & D for infection  . Cholecystectomy    . Tubal ligation     Family History  Problem Relation Age of Onset  . Colon cancer    . Coronary artery disease    . Pancreatic cancer Sister   . Hypertension Brother   . Hypertension Sister   . Hypertension Sister   . Hypertension Sister    History  Substance Use Topics  . Smoking  status: Never Smoker   . Smokeless tobacco: Never Used  . Alcohol Use: No   OB History    No data available     Review of Systems  Constitutional: Negative for diaphoresis.  Eyes: Negative for photophobia and visual disturbance.  Respiratory: Negative for cough, chest tightness and shortness of breath.   Cardiovascular: Negative for chest pain, palpitations and leg swelling.  Gastrointestinal: Negative for nausea, vomiting, abdominal pain and abdominal distention.  Genitourinary: Negative for dysuria, enuresis and difficulty urinating.  Musculoskeletal: Negative for back pain, neck pain and neck stiffness.  Skin: Negative for color change, pallor, rash and wound.  Neurological: Negative for dizziness, vertigo, syncope, facial asymmetry, weakness, light-headedness, numbness and headaches.  All other systems reviewed and are negative.     Allergies  Amlodipine; Crestor; Piperacillin sod-tazobactam so; and Ultram  Home Medications   Prior to Admission medications   Medication Sig Start Date End Date Taking? Authorizing Provider  atorvastatin (LIPITOR) 40 MG tablet Take 40 mg by mouth daily.   Yes Historical Provider, MD  butalbital-acetaminophen-caffeine (FIORICET, ESGIC) 50-325-40 MG per tablet TAKE 1 TABLET BY MOUTH TWICE DAILY AS NEEDED FOR HEADACHE 06/08/13  Yes Corwin LevinsJames W John, MD  colchicine 0.6 MG tablet Take 1 tablet (0.6 mg total) by mouth 2 (two) times daily. 01/21/14  Yes Benjamin Cartner, PA-C  furosemide (LASIX) 40 MG tablet Take 40 mg by mouth daily as needed for  fluid.   Yes Historical Provider, MD  guanFACINE (TENEX) 1 MG tablet TAKE 1 TABLET BY MOUTH AT BEDTIME 03/14/13  Yes Cassell Clement, MD  IMITREX 100 MG tablet Take 1 tablet (100 mg total) by mouth every 2 (two) hours as needed for migraine or headache. May repeat in 2 hours if headache persists or recurs. 02/13/14  Yes Corwin Levins, MD  Linaclotide [facility]) 290 MCG CAPS capsule Take 290 mcg by mouth 2 (two) times a  week.   Yes Historical Provider, MD  polyethylene glycol powder (GLYCOLAX/MIRALAX) powder TAKE 17 GRAMS BY MOUTH DAILY AS NEEDED FOR CONSTIPATION 05/26/11  Yes Randall Hiss, MD  telmisartan (MICARDIS) 80 MG tablet TAKE 1 TABLET BY MOUTH DAILY 01/07/14  Yes Corwin Levins, MD  warfarin (COUMADIN) 5 MG tablet Take as directed by anticoagulation clinic Patient taking differently: Take 5-7.5 mg by mouth daily. Take 5 mg on Monday, Wednesday, Friday, and Saturday.  Take 7.5 mg on Sunday, Tuesday, and Thursday. as directed by anticoagulation clinic 12/21/13  Yes Cassell Clement, MD  HYDROcodone-acetaminophen (NORCO/VICODIN) 5-325 MG per tablet Take 2 tablets by mouth every 6 (six) hours as needed for moderate pain. 05/13/14 05/15/14  Jodean Lima, MD  lidocaine (LIDODERM) 5 % Place 1 patch onto the skin daily as needed (for pain). Remove & Discard patch within 12 hours or as directed by MD 12/19/12   Corwin Levins, MD  LINZESS 290 MCG CAPS capsule TAKE ONE CAPSULE BY MOUTH EVERY DAY Patient not taking: Reported on 05/13/2014 03/18/14   Cassell Clement, MD   BP 137/96 mmHg  Pulse 111  Temp(Src) 98 F (36.7 C) (Oral)  Resp 16  Ht  (1.6 m)  Wt 330 lb (149.687 kg)  BMI 58.47 kg/m2  SpO2 98% Physical Exam  Constitutional: She is oriented to person, place, and time. She appears well-developed and well-nourished. No distress.  HENT:  Head: Normocephalic and atraumatic.  Mouth/Throat: Oropharynx is clear and moist.  Eyes: Conjunctivae and EOM are normal. Pupils are equal, round, and reactive to light.  Neck: Normal range of motion. Neck supple.  Cardiovascular: Normal rate, regular rhythm, normal heart sounds and intact distal pulses.  Exam reveals no gallop and no friction rub.   No murmur heard. Pulmonary/Chest: Effort normal and breath sounds normal. No respiratory distress. She has no wheezes. She has no rales. She exhibits no tenderness.  Abdominal: Soft. Bowel sounds are normal. She exhibits no  distension. There is no tenderness. There is no rebound and no guarding.  Musculoskeletal:       Right shoulder: She exhibits decreased range of motion, tenderness and pain. She exhibits no bony tenderness, no swelling, no deformity, normal pulse and normal strength.       Left knee: She exhibits normal range of motion, no swelling, no ecchymosis and no deformity. Tenderness (generalizzed) found.       Cervical back: She exhibits no tenderness and no bony tenderness.       Thoracic back: She exhibits no tenderness and no bony tenderness.       Lumbar back: She exhibits no tenderness and no bony tenderness.       Right upper arm: She exhibits tenderness and bony tenderness. She exhibits no swelling and no deformity.  Neurological: She is alert and oriented to person, place, and time. She has normal strength. No cranial nerve deficit or sensory deficit. Gait normal. GCS eye subscore is 4. GCS verbal subscore is 5. GCS motor subscore  is 6.  Skin: Skin is warm and dry. No rash noted. She is not diaphoretic. No erythema. No pallor.  Nursing note and vitals reviewed.   ED Course  Procedures (including critical care time) Labs Review Labs Reviewed - No data to display  Imaging Review Ct Head Wo Contrast  05/13/2014   CLINICAL DATA:  Tripped and fell without loss of consciousness.  EXAM: CT HEAD WITHOUT CONTRAST  TECHNIQUE: Contiguous axial images were obtained from the base of the skull through the vertex without intravenous contrast.  COMPARISON:  None  FINDINGS: There is no intracranial hemorrhage, mass or evidence of acute infarction. There is mild generalized atrophy. There is minimal chronic microvascular ischemic change. There is no significant extra-axial fluid collection.  No acute intracranial findings are evident. Calvarium and skullbase are intact.  IMPRESSION: Slight generalized atrophy and minimal chronic white matter hypodensity. No acute intracranial findings.   Electronically Signed   By:  Ellery Plunk M.D.   On: 05/13/2014 22:45   Ct Knee Left Wo Contrast  05/13/2014   CLINICAL DATA:  Left knee pain after a fall.  EXAM: CT OF THE left KNEE WITHOUT CONTRAST  TECHNIQUE: Multidetector CT imaging of the left knee was performed according to the standard protocol. Multiplanar CT image reconstructions were also generated.  COMPARISON:  Left knee 05/13/2014  FINDINGS: Degenerative changes in the left knee involving medial, lateral, and patellofemoral compartments with joint space narrowing and osteophytes in all 3 compartments. Changes are probably most prominent in the medial compartment. No evidence of acute fracture or dislocation. No significant effusion. There is infiltration in the subcutaneous fat over the patella and medial aspect of the left anterior knee. This may indicate edema or bruising. No discrete loculated fluid collection is identified.  IMPRESSION: Tricompartment degenerative changes in the left knee. No evidence of acute fracture or dislocation. No significant effusion. Bruising versus edema in the soft tissues anterior and medial to the knee.   Electronically Signed   By: Burman Nieves M.D.   On: 05/13/2014 22:51   Ct Shoulder Right Wo Contrast  05/13/2014   CLINICAL DATA:  Right shoulder pain after a fall.  EXAM: CT OF THE RIGHT SHOULDER WITHOUT CONTRAST  TECHNIQUE: Multidetector CT imaging was performed according to the standard protocol. Multiplanar CT image reconstructions were also generated.  COMPARISON:  Right shoulder 05/13/2014  FINDINGS: Degenerative changes in the acromioclavicular and glenohumeral joints with mild joint space narrowing and osteophyte presents. No evidence of acute fracture or dislocation of the right shoulder. No significant effusion. Soft tissues are unremarkable. No focal bone lesions or bone destruction. Bone cortex and trabecular architecture appear intact.  IMPRESSION: Degenerative changes in the right shoulder. No acute fracture or  dislocation   Electronically Signed   By: Burman Nieves M.D.   On: 05/13/2014 22:48   Dg Shoulder Right Port  05/13/2014   CLINICAL DATA:  Right shoulder pain after fall today.  EXAM: PORTABLE RIGHT SHOULDER - 2+ VIEW  COMPARISON:  None.  FINDINGS: 2 portable views of the right shoulder, limited by body habitus and portable technique, are negative for fracture or dislocation.  IMPRESSION: Negative limited study.   Electronically Signed   By: Ellery Plunk M.D.   On: 05/13/2014 21:25   Dg Knee Left Port  05/13/2014   CLINICAL DATA:  Left knee pain following a fall today.  EXAM: PORTABLE LEFT KNEE - 1-2 VIEW  COMPARISON:  Left lower leg radiographs obtained today.  FINDINGS: The examination  is severely limited due to the fact that only AP and one mildly oblique view could be obtained. There is no lateral view. There is moderate medial and lateral joint space narrowing with associated spur formation. The patellofemoral space cannot be assessed and it cannot be determined if an effusion is present today. No visible fracture or dislocation on these views.  IMPRESSION: 1. Very limited examination with no visible fracture. A complete radiographic series of the knee in the Radiology Department is recommended when possible. 2. Degenerative changes.   Electronically Signed   By: Beckie SaltsSteven  Reid M.D.   On: 05/13/2014 21:27   Dg Tibia/fibula Left Port  05/13/2014   CLINICAL DATA:  Left lower leg pain after fall today.  EXAM: PORTABLE LEFT TIBIA AND FIBULA - 2 VIEW  COMPARISON:  None.  FINDINGS: Four portable views of the left lower leg are negative for displaced fracture or radiopaque foreign body. Moderate arthritic changes are present about the knee.  IMPRESSION: Negative for acute fracture   Electronically Signed   By: Ellery Plunkaniel R Mitchell M.D.   On: 05/13/2014 21:26   Dg Humerus Right  05/13/2014   CLINICAL DATA:  Right shoulder pain secondary to a fall tonight.  EXAM: RIGHT HUMERUS - 2+ VIEW  COMPARISON:  None.   FINDINGS: No visible fracture or dislocation. Views of the distal humerus are limited due to the patient's immobility.  IMPRESSION: No acute abnormality of the right humerus.   Electronically Signed   By: Francene BoyersJames  Maxwell M.D.   On: 05/13/2014 21:25     EKG Interpretation None      MDM   Final diagnoses:  Contusion shoulder/arm, right, initial encounter  Contusion, knee, left, initial encounter  Fall, initial encounter    59 yo F presenting with R shoulder and L knee pain s/ fall from standing.  Fell after tripping over metal bar sticking out of ground.  Fell onto outstretched R shoulder, and impact to L knee.  Denies head injury or LOC, recalls entire incident.  EMS called, VSS en route.  Pt reports taking coumadin for DVT.  On presentation, pt alert, VSS, in NAD.  Exam with TTP to lateral R shoulder without obvious deformity although exam limited due to body habitus.  ROM limited due to pain.  Distal pulses, sensation intact.  Abrasion to left knee with generalized TTP, no effusion or deformity.  LLE neurovascularly intact.  No evidence of head trauma, C-, T-, or L-spine TTP.  No other acute findings.  Plan for XR R shoulder and L knee, CT head given fall on coumadin.  XR results limited due to body habitus and immobility but no acute fractures identified.  Pt still with significant pain- will obtain CT shoulder and knee to r/o fracture.  CT head unremarkable.  Remainder of imaging without fracture or acute injury.  RUE placed in sling for comfort.  Pt able to ambulate without difficulty.  F/u with PCP as needed.  ED return precautions given.  No other concerns.  Discussed with attending Dr. Manus Gunningancour.    Jodean LimaEmily Jocob Dambach, MD 05/15/14 1858  Glynn OctaveStephen Rancour, MD 05/16/14 [phone]

## 2014-05-13 NOTE — ED Notes (Signed)
Pt denies improvement with pain medication; bruising noted to R knee with abrasion. Awaiting CT scan

## 2014-05-13 NOTE — ED Notes (Signed)
Per EMS: pt coming from gas station. Pt states she tripped over a metal bar at the gas station. Pt denies neck/back pain, denies LOC. Pt reports severe right shoulder pain and left knee pain. Pt A&Ox4, respirations equal and unlabored, skin warm and dry

## 2014-05-13 NOTE — ED Notes (Signed)
Patient transported to CT 

## 2014-05-14 NOTE — ED Notes (Signed)
Pt ambulatory with MD and resident with minimal assistance.

## 2014-05-16 ENCOUNTER — Other Ambulatory Visit: Payer: Self-pay | Admitting: Cardiology

## 2014-05-16 ENCOUNTER — Ambulatory Visit (INDEPENDENT_AMBULATORY_CARE_PROVIDER_SITE_OTHER): Payer: BC Managed Care – PPO | Admitting: *Deleted

## 2014-05-16 DIAGNOSIS — I82409 Acute embolism and thrombosis of unspecified deep veins of unspecified lower extremity: Secondary | ICD-10-CM

## 2014-05-16 DIAGNOSIS — Z5181 Encounter for therapeutic drug level monitoring: Secondary | ICD-10-CM | POA: Diagnosis not present

## 2014-05-16 DIAGNOSIS — I2699 Other pulmonary embolism without acute cor pulmonale: Secondary | ICD-10-CM | POA: Diagnosis not present

## 2014-05-16 DIAGNOSIS — Z7901 Long term (current) use of anticoagulants: Secondary | ICD-10-CM

## 2014-05-16 LAB — POCT INR: INR: 8

## 2014-05-16 LAB — PROTIME-INR
INR: 8.29 (ref <1.50)
Prothrombin Time: 69.4 seconds — ABNORMAL HIGH (ref 11.6–15.2)

## 2014-05-16 MED ORDER — PHYTONADIONE 5 MG PO TABS: ORAL_TABLET | ORAL | Status: DC

## 2014-05-20 ENCOUNTER — Ambulatory Visit (INDEPENDENT_AMBULATORY_CARE_PROVIDER_SITE_OTHER): Payer: BC Managed Care – PPO | Admitting: *Deleted

## 2014-05-20 DIAGNOSIS — I82409 Acute embolism and thrombosis of unspecified deep veins of unspecified lower extremity: Secondary | ICD-10-CM

## 2014-05-20 DIAGNOSIS — I2699 Other pulmonary embolism without acute cor pulmonale: Secondary | ICD-10-CM

## 2014-05-20 DIAGNOSIS — Z5181 Encounter for therapeutic drug level monitoring: Secondary | ICD-10-CM | POA: Diagnosis not present

## 2014-05-20 DIAGNOSIS — Z7901 Long term (current) use of anticoagulants: Secondary | ICD-10-CM

## 2014-05-20 LAB — POCT INR: INR: 2.8

## 2014-05-21 ENCOUNTER — Telehealth: Payer: Self-pay | Admitting: Internal Medicine

## 2014-05-22 ENCOUNTER — Other Ambulatory Visit (INDEPENDENT_AMBULATORY_CARE_PROVIDER_SITE_OTHER): Payer: BC Managed Care – PPO

## 2014-05-22 ENCOUNTER — Ambulatory Visit (INDEPENDENT_AMBULATORY_CARE_PROVIDER_SITE_OTHER): Payer: BC Managed Care – PPO | Admitting: Internal Medicine

## 2014-05-22 ENCOUNTER — Encounter: Payer: Self-pay | Admitting: Internal Medicine

## 2014-05-22 VITALS — BP 122/84 | HR 95 | Temp 99.4°F | Resp 18 | Ht 63.5 in | Wt 352.0 lb

## 2014-05-22 DIAGNOSIS — Z23 Encounter for immunization: Secondary | ICD-10-CM

## 2014-05-22 DIAGNOSIS — T148 Other injury of unspecified body region: Secondary | ICD-10-CM | POA: Diagnosis not present

## 2014-05-22 DIAGNOSIS — M79672 Pain in left foot: Secondary | ICD-10-CM | POA: Diagnosis not present

## 2014-05-22 DIAGNOSIS — J309 Allergic rhinitis, unspecified: Secondary | ICD-10-CM | POA: Diagnosis not present

## 2014-05-22 DIAGNOSIS — Z7901 Long term (current) use of anticoagulants: Secondary | ICD-10-CM | POA: Diagnosis not present

## 2014-05-22 DIAGNOSIS — Z0000 Encounter for general adult medical examination without abnormal findings: Secondary | ICD-10-CM

## 2014-05-22 DIAGNOSIS — T07XXXA Unspecified multiple injuries, initial encounter: Secondary | ICD-10-CM | POA: Insufficient documentation

## 2014-05-22 LAB — CBC WITH DIFFERENTIAL/PLATELET
BASOS ABS: 0 10*3/uL (ref 0.0–0.1)
Basophils Relative: 0.3 % (ref 0.0–3.0)
Eosinophils Absolute: 0 10*3/uL (ref 0.0–0.7)
Eosinophils Relative: 0.2 % (ref 0.0–5.0)
HCT: 28.5 % — ABNORMAL LOW (ref 36.0–46.0)
Hemoglobin: 9.3 g/dL — ABNORMAL LOW (ref 12.0–15.0)
LYMPHS PCT: 17.6 % (ref 12.0–46.0)
Lymphs Abs: 1.9 10*3/uL (ref 0.7–4.0)
MCHC: 32.7 g/dL (ref 30.0–36.0)
MCV: 85 fl (ref 78.0–100.0)
MONO ABS: 0.6 10*3/uL (ref 0.1–1.0)
Monocytes Relative: 5.2 % (ref 3.0–12.0)
NEUTROS PCT: 76.7 % (ref 43.0–77.0)
Neutro Abs: 8.5 10*3/uL — ABNORMAL HIGH (ref 1.4–7.7)
PLATELETS: 391 10*3/uL (ref 150.0–400.0)
RBC: 3.35 Mil/uL — ABNORMAL LOW (ref 3.87–5.11)
RDW: 16.4 % — AB (ref 11.5–15.5)
WBC: 11 10*3/uL — ABNORMAL HIGH (ref 4.0–10.5)

## 2014-05-22 LAB — URINALYSIS, ROUTINE W REFLEX MICROSCOPIC
Hgb urine dipstick: NEGATIVE
Ketones, ur: NEGATIVE
Nitrite: NEGATIVE
RBC / HPF: NONE SEEN (ref 0–?)
SPECIFIC GRAVITY, URINE: 1.01 (ref 1.000–1.030)
URINE GLUCOSE: NEGATIVE
Urobilinogen, UA: 4 — AB (ref 0.0–1.0)
pH: 6.5 (ref 5.0–8.0)

## 2014-05-22 MED ORDER — METHYLPREDNISOLONE ACETATE 80 MG/ML IJ SUSP
80.0000 mg | Freq: Once | INTRAMUSCULAR | Status: AC
  Administered 2014-05-22: 80 mg via INTRAMUSCULAR

## 2014-05-22 MED ORDER — PREDNISONE 10 MG PO TABS: ORAL_TABLET | ORAL | Status: DC

## 2014-05-22 MED ORDER — DOXYCYCLINE HYCLATE 100 MG PO TABS: 100.0000 mg | ORAL_TABLET | Freq: Two times a day (BID) | ORAL | Status: DC

## 2014-05-22 MED ORDER — METHYLPREDNISOLONE ACETATE 80 MG/ML IJ SUSP: 80.0000 mg | Freq: Once | INTRAMUSCULAR | Status: DC

## 2014-05-22 NOTE — Assessment & Plan Note (Signed)
Also has some right ear popping and congestion recent, should improve with current tx

## 2014-05-22 NOTE — Progress Notes (Signed)
Subjective:    Patient ID: Einar GradCatherine J Diveley, female    DOB: 1955-11-14, 59 y.o.   MRN: [id number]  HPI  Here for wellness and f/u;  Overall doing ok;  Pt denies Chest pain, worsening SOB, DOE, wheezing, orthopnea, PND, worsening LE edema, palpitations, dizziness or syncope.  Pt denies neurological change such as new headache, facial or extremity weakness.  Pt denies polydipsia, polyuria, or low sugar symptoms. Pt states overall good compliance with treatment and medications, good tolerability, and has been trying to follow appropriate diet.  Pt denies worsening depressive symptoms, suicidal ideation or panic. No fever, night sweats, wt loss, loss of appetite, or other constitutional symptoms.  Pt states good ability with ADL's, has low fall risk, home safety reviewed and adequate, no other significant changes in hearing or vision, and only occasionally active with exercise.  Did have fall after trip in parking lot May 9 , to left knee and right shoulder trying to catch herself, has  MRI right shoulder yesterday per ortho - Dr Penni BombardKendall. Did have severe bruising involved with INR over 8, followed closely by coumadin clinic at [address]. Did note some transient BRB in urine/hematuria last wk for 2 days,, then cleared up.Has low grade temp today only, Denies urinary symptoms such as dysuria, frequency, urgency, flank pain, hematuria or n/v, chills.No cbc done since fall and bruising with elev INR. INR improved, never has been less than 2 since fall.   Also Unfortunately also started with sudden onset left ankle and great toe pain, redness, swelling similar to prior gout episode to right foot. Also has small superficial wound/ulcer at the dorsal base of great toe where she originally caught the foot on a piece of metal at time of fall.    Has not had tetanus since fall, none in last 10 yrs.  Past Medical History  Diagnosis Date  . Hypertension   . Morbid obesity   . Nonsmoker   . Pulmonary embolism 2010      following right knee replacement  . DVT (deep venous thrombosis)     right upper extremitiy  . Chronic anticoagulation   . Infected prosthetic knee joint   . H/O: GI bleed   . Anemia   . IBS (irritable bowel syndrome)   . Hyperlipidemia   . WPW (Wolff-Parkinson-White syndrome)   . Anemia, unspecified 04/23/2011  . Diverticulosis   . Colon polyps may 2011  . Allergic rhinitis, cause unspecified 04/29/2011  . Migraine 04/29/2011  . Gout 04/22/2010  . GERD (gastroesophageal reflux disease) 11/10/2011   Past Surgical History  Procedure Laterality Date  . Right knee replacement  2010    followed by I & D for infection  . Cholecystectomy    . Tubal ligation      reports that she has never smoked. She has never used smokeless tobacco. She reports that she does not drink alcohol or use illicit drugs. family history includes Colon cancer in an other family member; Coronary artery disease in an other family member; Hypertension in her brother, sister, sister, and sister; Pancreatic cancer in her sister. Allergies  Allergen Reactions  . Amlodipine     Edema,ha  . Crestor [Rosuvastatin Calcium]     Chest pain  . Piperacillin Sod-Tazobactam So   . Ultram [Tramadol]     nausea   Current Outpatient Prescriptions on File Prior to Visit  Medication Sig Dispense Refill  . atorvastatin (LIPITOR) 40 MG tablet Take 40 mg by mouth daily.    .Marland Kitchen  butalbital-acetaminophen-caffeine (FIORICET, ESGIC) 50-325-40 MG per tablet TAKE 1 TABLET BY MOUTH TWICE DAILY AS NEEDED FOR HEADACHE 14 tablet 0  . colchicine 0.6 MG tablet Take 1 tablet (0.6 mg total) by mouth 2 (two) times daily. 8 tablet 0  . furosemide (LASIX) 40 MG tablet Take 40 mg by mouth daily as needed for fluid.    Marland Kitchen. guanFACINE (TENEX) 1 MG tablet TAKE 1 TABLET BY MOUTH AT BEDTIME 90 tablet 3  . IMITREX 100 MG tablet Take 1 tablet (100 mg total) by mouth every 2 (two) hours as needed for migraine or headache. May repeat in 2 hours if headache  persists or recurs. 10 tablet 11  . Linaclotide (LINZESS) 290 MCG CAPS capsule Take 290 mcg by mouth 2 (two) times a week.    Marland Kitchen. oxyCODONE-acetaminophen (PERCOCET/ROXICET) 5-325 MG per tablet Take 1 tablet by mouth every 6 (six) hours as needed for severe pain.    . phytonadione (MEPHYTON) 5 MG tablet Vitamin K (Mephyton ) 5mg   take 1/2 tablet (2.5mg ) now and save remaining 1/2 tablet 1 tablet 0  . polyethylene glycol powder (GLYCOLAX/MIRALAX) powder TAKE 17 GRAMS BY MOUTH DAILY AS NEEDED FOR CONSTIPATION 527 g 6  . telmisartan (MICARDIS) 80 MG tablet TAKE 1 TABLET BY MOUTH DAILY 90 tablet 1  . warfarin (COUMADIN) 5 MG tablet TAKE 1 1/2 TABLET BY MOUTH, EXCEPT 1 TABLET ON MONDAY AND FRIDAYS OR AS DIRECTED 60 tablet 2   No current facility-administered medications on file prior to visit.   Review of Systems Constitutional: Negative for increased diaphoresis, other activity, appetite or siginficant weight change other than noted HENT: Negative for worsening hearing loss, ear pain, facial swelling, mouth sores and neck stiffness.   Eyes: Negative for other worsening pain, redness or visual disturbance.  Respiratory: Negative for shortness of breath and wheezing  Cardiovascular: Negative for chest pain and palpitations.  Gastrointestinal: Negative for diarrhea, blood in stool, abdominal distention or other pain Genitourinary: Negative for hematuria, flank pain or change in urine volume.  Musculoskeletal: Negative for myalgias or other joint complaints.  Skin: Negative for color change and wound or drainage.  Neurological: Negative for syncope and numbness. other than noted Hematological: Negative for adenopathy. or other swelling Psychiatric/Behavioral: Negative for hallucinations, SI, self-injury, decreased concentration or other worsening agitation.      Objective:   Physical Exam BP 122/84 mmHg  Pulse 95  Temp(Src) 99.4 F (37.4 C) (Oral)  Resp 18  Ht 5' 3.5" (1.613 m)  Wt 352 lb  (159.666 kg)  BMI 61.37 kg/m2  SpO2 98% VS noted, not ill appearing, but in pain due to right shoulder, left knee and anterior leg below knee, in addition to left great toe  Constitutional: Pt is oriented to person, place, and time. Appears well-developed and well-nourished, in no significant distress Head: Normocephalic and atraumatic.  Right Ear: External ear normal.  Left Ear: External ear normal.  Nose: Nose normal.  Mouth/Throat: Oropharynx is clear and moist.  Eyes: Conjunctivae and EOM are normal. Pupils are equal, round, and reactive to light.  Neck: Normal range of motion. Neck supple. No JVD present. No tracheal deviation present or significant neck LA or mass Cardiovascular: Normal rate, regular rhythm, normal heart sounds and intact distal pulses.   Pulmonary/Chest: Effort normal and breath sounds without rales or wheezing  Abdominal: Soft. Bowel sounds are normal. NT. No HSM  Musculoskeletal: Normal range of motion. Exhibits no edema.  Lymphadenopathy:  Has no cervical adenopathy.  Neurological: Pt  is alert and oriented to person, place, and time. Pt has normal reflexes. No cranial nerve deficit. Motor grossly intact Skin: Skin is warm and dry. No rash noted. Left dorsal foot with shallow 5 mm and oval .5 cm diameter lesion without drainage; has marked warmth and swelling/tender medial to this primarily at the first MTP, with erythema and swelling extneding medially to the length of the foot, also has marked left ankle swelling but much less warm and tender, seems more assoc with the 2-3+ whole leg edema below the left knee injury with marked bruised area anteriorly as well; also with severe bruising involving the RUE extending almost to the wrist with mild to mod tender and some swelling but no erythema or drainage, LLE o/w neurovasc intact Psychiatric:  Has normal mood and affect. Behavior is normal.      Assessment & Plan:

## 2014-05-22 NOTE — Patient Instructions (Addendum)
You had the Tetanus shot (Tdap) today, and the steroid shot today  Please take all new medication as prescribed - the antibiotic, as well as the prednisone  You can also take Mucinex (or it's generic off brand) for ear congestion, and tylenol as needed for pain.  Please continue all other medications as before, and refills have been done if requested.  Please have the pharmacy call with any other refills you may need.  Please continue your efforts at being more active, low cholesterol diet, and weight control.  You are otherwise up to date with prevention measures today.  Please keep your appointments with your specialists as you may have planned  Please go to the LAB in the Basement (turn left off the elevator) for the tests to be done today - just the CBC (and a urine test) to check for anemia with all the bruising, since you already had other recent labs as well  Please return in 6 months, or sooner if needed

## 2014-05-22 NOTE — Assessment & Plan Note (Signed)
Almost certainly to have left first MTP acute gout, but cannot r/o cellulitis as well assoc with the foot wound ; for Tdap today, also depomedrol IM, predpac asd, and doxycycline bid course,  to f/u any worsening symptoms or concerns

## 2014-05-22 NOTE — Assessment & Plan Note (Signed)
Quite significant, has not had cbc post fall, for f/u cbc today

## 2014-05-22 NOTE — Assessment & Plan Note (Addendum)
With recent elve INR, assoc with gross hematuria - ok for f/u UA today if possilbe, follow INR with coumadin clinic

## 2014-05-22 NOTE — Assessment & Plan Note (Signed)
Overall doing well, age appropriate education and counseling updated, referrals for preventative services and immunizations addressed, dietary and smoking counseling addressed, most recent labs reviewed.  I have personally reviewed and have noted:  1) the patient's medical and social history 2) The pt's use of alcohol, tobacco, and illicit drugs 3) The patient's current medications and supplements 4) Functional ability including ADL's, fall risk, home safety risk, hearing and visual impairment 5) Diet and physical activities 6) Evidence for depression or mood disorder 7) The patient's height, weight, and BMI have been recorded in the chart  I have made referrals, and provided counseling and education based on review of the above Has had recent labs, will not repeat at this time  Lab Results  Component Value Date   WBC 8.2 04/30/2014   HGB 14.7 04/30/2014   HCT 44.6 04/30/2014   PLT 240.0 04/30/2014   GLUCOSE 83 04/30/2014   CHOL 246* 04/30/2014   TRIG 123.0 04/30/2014   HDL 42.10 04/30/2014   LDLDIRECT 148.9 12/11/2012   LDLCALC 179* 04/30/2014   ALT 11 04/30/2014   AST 12 04/30/2014   NA 141 04/30/2014   K 3.8 04/30/2014   CL 104 04/30/2014   CREATININE 1.18 04/30/2014   BUN 16 04/30/2014   CO2 31 04/30/2014   TSH 1.05 06/19/2013   INR 2.8 05/20/2014

## 2014-05-22 NOTE — Progress Notes (Signed)
Pre visit review using our clinic review tool, if applicable. No additional management support is needed unless otherwise documented below in the visit note. 

## 2014-05-23 ENCOUNTER — Encounter: Payer: Self-pay | Admitting: Internal Medicine

## 2014-05-23 ENCOUNTER — Other Ambulatory Visit: Payer: Self-pay | Admitting: Internal Medicine

## 2014-05-23 DIAGNOSIS — D649 Anemia, unspecified: Secondary | ICD-10-CM

## 2014-05-24 ENCOUNTER — Other Ambulatory Visit (INDEPENDENT_AMBULATORY_CARE_PROVIDER_SITE_OTHER): Payer: BC Managed Care – PPO

## 2014-05-24 DIAGNOSIS — D649 Anemia, unspecified: Secondary | ICD-10-CM

## 2014-05-24 LAB — CBC WITH DIFFERENTIAL/PLATELET
Basophils Absolute: 0.1 10*3/uL (ref 0.0–0.1)
Basophils Relative: 0.5 % (ref 0.0–3.0)
Eosinophils Absolute: 0 10*3/uL (ref 0.0–0.7)
Eosinophils Relative: 0.1 % (ref 0.0–5.0)
HCT: 30.3 % — ABNORMAL LOW (ref 36.0–46.0)
HEMOGLOBIN: 9.7 g/dL — AB (ref 12.0–15.0)
LYMPHS PCT: 18.6 % (ref 12.0–46.0)
Lymphs Abs: 2 10*3/uL (ref 0.7–4.0)
MCHC: 32 g/dL (ref 30.0–36.0)
MCV: 86.8 fl (ref 78.0–100.0)
MONOS PCT: 5 % (ref 3.0–12.0)
Monocytes Absolute: 0.5 10*3/uL (ref 0.1–1.0)
NEUTROS ABS: 8.2 10*3/uL — AB (ref 1.4–7.7)
Neutrophils Relative %: 75.8 % (ref 43.0–77.0)
Platelets: 431 10*3/uL — ABNORMAL HIGH (ref 150.0–400.0)
RBC: 3.49 Mil/uL — AB (ref 3.87–5.11)
RDW: 16.9 % — ABNORMAL HIGH (ref 11.5–15.5)
WBC: 10.8 10*3/uL — AB (ref 4.0–10.5)

## 2014-05-24 LAB — IBC PANEL
Iron: 84 ug/dL (ref 42–145)
SATURATION RATIOS: 29.3 % (ref 20.0–50.0)
TRANSFERRIN: 205 mg/dL — AB (ref 212.0–360.0)

## 2014-05-24 LAB — FERRITIN: FERRITIN: 287.3 ng/mL (ref 10.0–291.0)

## 2014-05-27 ENCOUNTER — Ambulatory Visit (INDEPENDENT_AMBULATORY_CARE_PROVIDER_SITE_OTHER): Payer: BC Managed Care – PPO

## 2014-05-27 DIAGNOSIS — I2699 Other pulmonary embolism without acute cor pulmonale: Secondary | ICD-10-CM | POA: Diagnosis not present

## 2014-05-27 DIAGNOSIS — Z5181 Encounter for therapeutic drug level monitoring: Secondary | ICD-10-CM

## 2014-05-27 DIAGNOSIS — I82409 Acute embolism and thrombosis of unspecified deep veins of unspecified lower extremity: Secondary | ICD-10-CM

## 2014-05-27 DIAGNOSIS — Z7901 Long term (current) use of anticoagulants: Secondary | ICD-10-CM

## 2014-05-27 LAB — POCT INR: INR: 6.2

## 2014-05-29 ENCOUNTER — Ambulatory Visit (HOSPITAL_COMMUNITY)
Admission: RE | Admit: 2014-05-29 | Discharge: 2014-05-29 | Disposition: A | Discharge status: Home / Self Care | Payer: BC Managed Care – PPO | Source: Ambulatory Visit | Attending: Cardiovascular Disease | Admitting: Cardiovascular Disease

## 2014-05-29 ENCOUNTER — Other Ambulatory Visit (HOSPITAL_COMMUNITY): Payer: Self-pay | Admitting: Sports Medicine

## 2014-05-29 DIAGNOSIS — M25562 Pain in left knee: Secondary | ICD-10-CM | POA: Insufficient documentation

## 2014-06-04 ENCOUNTER — Ambulatory Visit (INDEPENDENT_AMBULATORY_CARE_PROVIDER_SITE_OTHER): Payer: BC Managed Care – PPO

## 2014-06-04 DIAGNOSIS — I2699 Other pulmonary embolism without acute cor pulmonale: Secondary | ICD-10-CM | POA: Diagnosis not present

## 2014-06-04 DIAGNOSIS — Z7901 Long term (current) use of anticoagulants: Secondary | ICD-10-CM | POA: Diagnosis not present

## 2014-06-04 DIAGNOSIS — Z5181 Encounter for therapeutic drug level monitoring: Secondary | ICD-10-CM | POA: Diagnosis not present

## 2014-06-04 DIAGNOSIS — I82409 Acute embolism and thrombosis of unspecified deep veins of unspecified lower extremity: Secondary | ICD-10-CM

## 2014-06-04 LAB — POCT INR: INR: 3

## 2014-06-11 ENCOUNTER — Ambulatory Visit (INDEPENDENT_AMBULATORY_CARE_PROVIDER_SITE_OTHER): Payer: BC Managed Care – PPO | Admitting: *Deleted

## 2014-06-11 DIAGNOSIS — I2699 Other pulmonary embolism without acute cor pulmonale: Secondary | ICD-10-CM | POA: Diagnosis not present

## 2014-06-11 DIAGNOSIS — Z7901 Long term (current) use of anticoagulants: Secondary | ICD-10-CM

## 2014-06-11 DIAGNOSIS — I82409 Acute embolism and thrombosis of unspecified deep veins of unspecified lower extremity: Secondary | ICD-10-CM | POA: Diagnosis not present

## 2014-06-11 DIAGNOSIS — Z5181 Encounter for therapeutic drug level monitoring: Secondary | ICD-10-CM

## 2014-06-11 LAB — POCT INR: INR: 4.5

## 2014-06-21 ENCOUNTER — Ambulatory Visit (INDEPENDENT_AMBULATORY_CARE_PROVIDER_SITE_OTHER): Payer: BC Managed Care – PPO | Admitting: *Deleted

## 2014-06-21 DIAGNOSIS — Z5181 Encounter for therapeutic drug level monitoring: Secondary | ICD-10-CM

## 2014-06-21 DIAGNOSIS — Z7901 Long term (current) use of anticoagulants: Secondary | ICD-10-CM | POA: Diagnosis not present

## 2014-06-21 DIAGNOSIS — I2699 Other pulmonary embolism without acute cor pulmonale: Secondary | ICD-10-CM | POA: Diagnosis not present

## 2014-06-21 DIAGNOSIS — I82409 Acute embolism and thrombosis of unspecified deep veins of unspecified lower extremity: Secondary | ICD-10-CM

## 2014-06-21 LAB — POCT INR: INR: 2.9

## 2014-06-27 ENCOUNTER — Telehealth (HOSPITAL_COMMUNITY): Payer: Self-pay | Admitting: *Deleted

## 2014-07-03 ENCOUNTER — Other Ambulatory Visit: Payer: Self-pay | Admitting: Internal Medicine

## 2014-07-10 ENCOUNTER — Encounter: Payer: Self-pay | Admitting: Gastroenterology

## 2014-07-12 ENCOUNTER — Ambulatory Visit (INDEPENDENT_AMBULATORY_CARE_PROVIDER_SITE_OTHER): Payer: BC Managed Care – PPO | Admitting: *Deleted

## 2014-07-12 DIAGNOSIS — Z5181 Encounter for therapeutic drug level monitoring: Secondary | ICD-10-CM | POA: Diagnosis not present

## 2014-07-12 DIAGNOSIS — I82409 Acute embolism and thrombosis of unspecified deep veins of unspecified lower extremity: Secondary | ICD-10-CM | POA: Diagnosis not present

## 2014-07-12 DIAGNOSIS — I2699 Other pulmonary embolism without acute cor pulmonale: Secondary | ICD-10-CM | POA: Diagnosis not present

## 2014-07-12 DIAGNOSIS — Z7901 Long term (current) use of anticoagulants: Secondary | ICD-10-CM

## 2014-07-12 LAB — POCT INR: INR: 2

## 2014-07-18 ENCOUNTER — Other Ambulatory Visit: Payer: Self-pay

## 2014-07-18 MED ORDER — TELMISARTAN 80 MG PO TABS: 80.0000 mg | ORAL_TABLET | Freq: Every day | ORAL | Status: DC

## 2014-07-21 ENCOUNTER — Other Ambulatory Visit: Payer: Self-pay | Admitting: Internal Medicine

## 2014-07-30 ENCOUNTER — Ambulatory Visit (INDEPENDENT_AMBULATORY_CARE_PROVIDER_SITE_OTHER): Payer: BC Managed Care – PPO | Admitting: *Deleted

## 2014-07-30 ENCOUNTER — Ambulatory Visit (INDEPENDENT_AMBULATORY_CARE_PROVIDER_SITE_OTHER): Payer: BC Managed Care – PPO | Admitting: Cardiology

## 2014-07-30 ENCOUNTER — Encounter: Payer: Self-pay | Admitting: Cardiology

## 2014-07-30 VITALS — BP 144/90 | HR 89 | Ht 63.5 in | Wt 343.8 lb

## 2014-07-30 DIAGNOSIS — I82409 Acute embolism and thrombosis of unspecified deep veins of unspecified lower extremity: Secondary | ICD-10-CM | POA: Diagnosis not present

## 2014-07-30 DIAGNOSIS — E7800 Pure hypercholesterolemia, unspecified: Secondary | ICD-10-CM

## 2014-07-30 DIAGNOSIS — Z7901 Long term (current) use of anticoagulants: Secondary | ICD-10-CM

## 2014-07-30 DIAGNOSIS — I2699 Other pulmonary embolism without acute cor pulmonale: Secondary | ICD-10-CM | POA: Diagnosis not present

## 2014-07-30 DIAGNOSIS — I456 Pre-excitation syndrome: Secondary | ICD-10-CM | POA: Diagnosis not present

## 2014-07-30 DIAGNOSIS — Z5181 Encounter for therapeutic drug level monitoring: Secondary | ICD-10-CM

## 2014-07-30 DIAGNOSIS — I119 Hypertensive heart disease without heart failure: Secondary | ICD-10-CM | POA: Diagnosis not present

## 2014-07-30 LAB — POCT INR: INR: 1.6

## 2014-07-30 NOTE — Progress Notes (Signed)
Cardiology Office Note   Date:  07/30/2014   ID:  Mia, [name] May 29, 1955, MRN [id number]  PCP:  Mia Barre, MD  Cardiologist: Mia Clement MD  No chief complaint on file.     History of Present Illness: Mia [name] is a 59 y.o. female who presents for scheduled follow-up visit  This pleasant 59 year old African American woman is seen for a three-month followup office visit. She has a history of morbid obesity and a complex past medical history which includes deep vein thrombosis and previous pulmonary emboli. She has been on long-term Coumadin. She also has essential hypertension and hypercholesterolemia. She has osteoarthritis and has had a previously infected right knee prosthesis and is followed also by infectious disease and by her orthopedist. Her orthopedist has told her however that he would not consider reoperation for 5 years. Her orthopedist recently put her on permanent disability. . On a previous visit here her electrocardiogram showed a pattern of WPW although the patient has never had any symptoms from this. However a subsequent electrocardiogram on her subsequent office visit did not show any evidence of WPW.  She continues to have problems with morbid obesity and with severe constipation.She states that this time she has had no bowel movement for the past 5 days. The patient fell on May 9 at a gasoline service station.  She injured her right rotator cuff.  She has seen Dr. Ranell Patrick.  She also scraped her left knee badly and she states that it bled for 5 days.  She saw Dr. Rodolph Bong who helped her with that. The patient has not been having any recent chest pain. She has occasional left arm pain she has permanent chronic pain in the right knee. She has not been having any symptoms of recurrent pulmonary emboli. Her weight is down 9 pounds since last visit.  Past Medical History  Diagnosis Date  . Hypertension   . Morbid obesity   . Nonsmoker     . Pulmonary embolism 2010    following right knee replacement  . DVT (deep venous thrombosis)     right upper extremitiy  . Chronic anticoagulation   . Infected prosthetic knee joint   . H/O: GI bleed   . Anemia   . IBS (irritable bowel syndrome)   . Hyperlipidemia   . WPW (Wolff-Parkinson-White syndrome)   . Anemia, unspecified 04/23/2011  . Diverticulosis   . Colon polyps may 2011  . Allergic rhinitis, cause unspecified 04/29/2011  . Migraine 04/29/2011  . Gout 04/22/2010  . GERD (gastroesophageal reflux disease) 11/10/2011    Past Surgical History  Procedure Laterality Date  . Right knee replacement  2010    followed by I & D for infection  . Cholecystectomy    . Tubal ligation       Current Outpatient Prescriptions  Medication Sig Dispense Refill  . atorvastatin (LIPITOR) 40 MG tablet Take 40 mg by mouth daily.    . butalbital-acetaminophen-caffeine (FIORICET, ESGIC) 50-325-40 MG per tablet TAKE 1 TABLET BY MOUTH TWICE DAILY AS NEEDED FOR HEADACHE 14 tablet 0  . colchicine 0.6 MG tablet Take 1 tablet (0.6 mg total) by mouth 2 (two) times daily. 8 tablet 0  . furosemide (LASIX) 40 MG tablet Take 40 mg by mouth daily as needed for fluid.    Marland Kitchen guanFACINE (TENEX) 1 MG tablet TAKE 1 TABLET BY MOUTH AT BEDTIME 90 tablet 3  . guanFACINE (TENEX) 1 MG tablet Take 1 tablet (1 mg  total) by mouth at bedtime. 30 tablet 3  . IMITREX 100 MG tablet Take 1 tablet (100 mg total) by mouth every 2 (two) hours as needed for migraine or headache. May repeat in 2 hours if headache persists or recurs. 10 tablet 11  . Linaclotide (LINZESS) 290 MCG CAPS capsule Take 290 mcg by mouth 2 (two) times a week.    . phytonadione (MEPHYTON) 5 MG tablet Vitamin K (Mephyton ) 5mg   take 1/2 tablet (2.5mg ) now and save remaining 1/2 tablet 1 tablet 0  . polyethylene glycol powder (GLYCOLAX/MIRALAX) powder TAKE 17 GRAMS BY MOUTH DAILY AS NEEDED FOR CONSTIPATION 527 g 6  . telmisartan (MICARDIS) 80 MG tablet Take  1 tablet (80 mg total) by mouth daily. 90 tablet 1  . warfarin (COUMADIN) 5 MG tablet TAKE 1 1/2 TABLET BY MOUTH, EXCEPT 1 TABLET ON MONDAY AND FRIDAYS OR AS DIRECTED 60 tablet 2   No current facility-administered medications for this visit.    Allergies:   Crestor; Amlodipine; Piperacillin sod-tazobactam so; and Ultram    Social History:  The patient  reports that she has never smoked. She has never used smokeless tobacco. She reports that she does not drink alcohol or use illicit drugs.   Family History:  The patient's family history includes Colon cancer in an other family member; Coronary artery disease in an other family member; Hypertension in her brother, sister, sister, and sister; Pancreatic cancer in her sister.    ROS:  Please see the history of present illness.   Otherwise, review of systems are positive for none.   All other systems are reviewed and negative.    PHYSICAL EXAM: VS:  BP 144/90 mmHg  Pulse 89  Ht 5' 3.5" (1.613 m)  Wt 343 lb 12.8 oz (155.947 kg)  BMI 59.94 kg/m2  SpO2 98% , BMI Body mass index is 59.94 kg/(m^2). GEN: Well nourished, well developed, in no acute distress.  Morbid obesity HEENT: normal Neck: no JVD, carotid bruits, or masses Cardiac: RRR; no murmurs, rubs, or gallops,no edema  Respiratory:  clear to auscultation bilaterally, normal work of breathing GI: soft, nontender, nondistended, + BS MS: no deformity or atrophy Skin: warm and dry, no rash Neuro:  Strength and sensation are intact Psych: euthymic mood, full affect   EKG:  EKG is not ordered today. The ekg ordered today demonstrates    Recent Labs: 04/30/2014: ALT 11; BUN 16; Creatinine, Ser 1.18; Potassium 3.8; Sodium 141 05/24/2014: Hemoglobin 9.7*; Platelets 431.0*    Lipid Panel    Component Value Date/Time   CHOL 246* 04/30/2014 1647   TRIG 123.0 04/30/2014 1647   HDL 42.10 04/30/2014 1647   CHOLHDL 6 04/30/2014 1647   VLDL 24.6 04/30/2014 1647   LDLCALC 179*  04/30/2014 1647   LDLDIRECT 148.9 12/11/2012 0934      Wt Readings from Last 3 Encounters:  07/30/14 343 lb 12.8 oz (155.947 kg)  05/22/14 352 lb (159.666 kg)  05/13/14 330 lb (149.687 kg)      Other studies Reviewed: Additional studies/ records that were reviewed today include: . Review of the above records demonstrates:    ASSESSMENT AND PLAN:  1. WPW, intermittent, asymptomatic. 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 Hypercholesterolemia 4. Past history of deep vein thrombosis and pulmonary emboli, on long-term Coumadin. 5. morbid obesity 6. severe constipation  Plan: Continue same medication.  Recheck in 3 months for follow-up office visit EKG, CBC, lipid panel, hepatic function panel, and basal metabolic panel.  Current medicines are reviewed at length with the patient today.  The patient does not have concerns regarding medicines.  The following changes have been made:  no change  Labs/ tests ordered today include:   Orders Placed This Encounter  Procedures  . Lipid panel  . Basic metabolic panel  . Hepatic function panel  . CBC with Differential/Platelet       Signed, Mia Clement MD 07/30/2014 1:44 PM    [facility] [address] [place], [place], [place]  [address] Phone: [phone]; Fax: [phone]

## 2014-07-30 NOTE — Patient Instructions (Signed)
Medication Instructions:  Your physician recommends that you continue on your current medications as directed. Please refer to the Current Medication list given to you today.  Labwork: NONE  Testing/Procedures: NONE  Follow-Up: Your physician recommends that you schedule a follow-up appointment in: Your physician recommends that you schedule a follow-up appointment in: 3 months with fasting labs (LP/BMET/HFP/CBC) AND EKG

## 2014-07-31 ENCOUNTER — Other Ambulatory Visit: Payer: Self-pay

## 2014-07-31 MED ORDER — ATORVASTATIN CALCIUM 40 MG PO TABS: 40.0000 mg | ORAL_TABLET | Freq: Every day | ORAL | Status: DC

## 2014-08-20 ENCOUNTER — Ambulatory Visit (INDEPENDENT_AMBULATORY_CARE_PROVIDER_SITE_OTHER): Payer: BC Managed Care – PPO | Admitting: *Deleted

## 2014-08-20 DIAGNOSIS — Z5181 Encounter for therapeutic drug level monitoring: Secondary | ICD-10-CM | POA: Diagnosis not present

## 2014-08-20 LAB — POCT INR: INR: 1.7

## 2014-09-03 ENCOUNTER — Ambulatory Visit (INDEPENDENT_AMBULATORY_CARE_PROVIDER_SITE_OTHER): Payer: BC Managed Care – PPO

## 2014-09-03 DIAGNOSIS — Z5181 Encounter for therapeutic drug level monitoring: Secondary | ICD-10-CM

## 2014-09-03 LAB — POCT INR: INR: 2

## 2014-09-24 ENCOUNTER — Ambulatory Visit (INDEPENDENT_AMBULATORY_CARE_PROVIDER_SITE_OTHER): Payer: BC Managed Care – PPO | Admitting: Pharmacist

## 2014-09-24 DIAGNOSIS — I82409 Acute embolism and thrombosis of unspecified deep veins of unspecified lower extremity: Secondary | ICD-10-CM | POA: Diagnosis not present

## 2014-09-24 DIAGNOSIS — Z5181 Encounter for therapeutic drug level monitoring: Secondary | ICD-10-CM

## 2014-09-24 LAB — POCT INR: INR: 2

## 2014-10-22 ENCOUNTER — Ambulatory Visit (INDEPENDENT_AMBULATORY_CARE_PROVIDER_SITE_OTHER): Payer: BC Managed Care – PPO

## 2014-10-22 DIAGNOSIS — Z5181 Encounter for therapeutic drug level monitoring: Secondary | ICD-10-CM | POA: Diagnosis not present

## 2014-10-22 LAB — POCT INR: INR: 2

## 2014-11-04 ENCOUNTER — Encounter: Payer: Self-pay | Admitting: Cardiology

## 2014-11-04 ENCOUNTER — Ambulatory Visit (INDEPENDENT_AMBULATORY_CARE_PROVIDER_SITE_OTHER): Payer: BC Managed Care – PPO | Admitting: Cardiology

## 2014-11-04 ENCOUNTER — Other Ambulatory Visit (INDEPENDENT_AMBULATORY_CARE_PROVIDER_SITE_OTHER): Payer: BC Managed Care – PPO | Admitting: *Deleted

## 2014-11-04 VITALS — BP 152/110 | HR 96 | Ht 63.5 in | Wt 344.4 lb

## 2014-11-04 DIAGNOSIS — E7800 Pure hypercholesterolemia, unspecified: Secondary | ICD-10-CM | POA: Diagnosis not present

## 2014-11-04 DIAGNOSIS — I10 Essential (primary) hypertension: Secondary | ICD-10-CM | POA: Diagnosis not present

## 2014-11-04 DIAGNOSIS — I119 Hypertensive heart disease without heart failure: Secondary | ICD-10-CM | POA: Diagnosis not present

## 2014-11-04 DIAGNOSIS — E785 Hyperlipidemia, unspecified: Secondary | ICD-10-CM

## 2014-11-04 LAB — HEPATIC FUNCTION PANEL
ALBUMIN: 3.5 g/dL — AB (ref 3.6–5.1)
ALK PHOS: 85 U/L (ref 33–130)
ALT: 11 U/L (ref 6–29)
AST: 15 U/L (ref 10–35)
BILIRUBIN DIRECT: 0.2 mg/dL (ref <=0.2)
BILIRUBIN TOTAL: 0.8 mg/dL (ref 0.2–1.2)
Indirect Bilirubin: 0.6 mg/dL (ref 0.2–1.2)
Total Protein: 7.1 g/dL (ref 6.1–8.1)

## 2014-11-04 LAB — CBC WITH DIFFERENTIAL/PLATELET
Basophils Absolute: 0 K/uL (ref 0.0–0.1)
Basophils Relative: 0 % (ref 0–1)
Eosinophils Absolute: 0.1 K/uL (ref 0.0–0.7)
Eosinophils Relative: 1 % (ref 0–5)
HCT: 44 % (ref 36.0–46.0)
Hemoglobin: 14.3 g/dL (ref 12.0–15.0)
Lymphocytes Relative: 25 % (ref 12–46)
Lymphs Abs: 2 K/uL (ref 0.7–4.0)
MCH: 26.4 pg (ref 26.0–34.0)
MCHC: 32.5 g/dL (ref 30.0–36.0)
MCV: 81.2 fL (ref 78.0–100.0)
MPV: 11 fL (ref 8.6–12.4)
Monocytes Absolute: 0.6 K/uL (ref 0.1–1.0)
Monocytes Relative: 8 % (ref 3–12)
Neutro Abs: 5.3 K/uL (ref 1.7–7.7)
Neutrophils Relative %: 66 % (ref 43–77)
Platelets: 247 K/uL (ref 150–400)
RBC: 5.42 MIL/uL — ABNORMAL HIGH (ref 3.87–5.11)
RDW: 15.8 % — ABNORMAL HIGH (ref 11.5–15.5)
WBC: 8.1 K/uL (ref 4.0–10.5)

## 2014-11-04 LAB — LIPID PANEL
Cholesterol: 151 mg/dL (ref 125–200)
HDL: 39 mg/dL — ABNORMAL LOW (ref >=46)
LDL Cholesterol: 91 mg/dL (ref <130)
Total CHOL/HDL Ratio: 3.9 ratio (ref <=5.0)
Triglycerides: 103 mg/dL (ref <150)
VLDL: 21 mg/dL (ref <30)

## 2014-11-04 LAB — BASIC METABOLIC PANEL
BUN: 11 mg/dL (ref 7–25)
CALCIUM: 9.3 mg/dL (ref 8.6–10.4)
CO2: 28 mmol/L (ref 20–31)
CREATININE: 0.72 mg/dL (ref 0.50–1.05)
Chloride: 103 mmol/L (ref 98–110)
Glucose, Bld: 107 mg/dL — ABNORMAL HIGH (ref 65–99)
Potassium: 3.6 mmol/L (ref 3.5–5.3)
Sodium: 140 mmol/L (ref 135–146)

## 2014-11-04 NOTE — Addendum Note (Signed)
Addended by: Tonita PhoenixBOWDEN, Lonna Rabold K on: 11/04/2014 11:13 AM   Modules accepted: Orders

## 2014-11-04 NOTE — Patient Instructions (Addendum)
Medication Instructions:  Your physician recommends that you continue on your current medications as directed. Please refer to the Current Medication list given to you today.  Labwork: Lp/bmet/hfp/cbc  Testing/Procedures: none  Follow-Up: Your physician recommends that you schedule a follow-up appointment in: 3 month ov/ekg with Dawayne PatriciaLori G NP  If you need a refill on your cardiac medications before your next appointment, please call your pharmacy.

## 2014-11-04 NOTE — Progress Notes (Signed)
Cardiology Office Note   Date:  11/04/2014   ID:  Mia, [name] [birth year], MRN [id number]  PCP:  Oliver Barre, MD  Cardiologist: Cassell Clement MD  No chief complaint on file.     History of Present Illness: Mia [name] is a 59 y.o. female who presents for 3 month follow-up visit  This pleasant 59 year old African American woman is seen for a three-month followup office visit. She has a history of morbid obesity and a complex past medical history which includes deep vein thrombosis and previous pulmonary emboli. She has been on long-term Coumadin. She also has essential hypertension and hypercholesterolemia. She has osteoarthritis and has had a previously infected right knee prosthesis and is followed also by infectious disease and by her orthopedist. Her orthopedist has told her however that he would not consider reoperation for 5 years. Her orthopedist recently put her on permanent disability. . On a previous visit here her electrocardiogram showed a pattern of WPW although the patient has never had any symptoms from this. However a subsequent electrocardiogram on her subsequent office visit did not show any evidence of WPW.  She continues to have problems with morbid obesity and with severe constipation.She states that this time she has had no bowel movement for the past 5 days. The patient has not been having any recent chest pain. She has occasional left arm pain she has permanent chronic pain in the right knee. She has not been having any symptoms of recurrent pulmonary emboli. Her weight is essentially unchanged over the summer. Her blood pressure is higher today but she ran out of her blood pressure medicines because of cost.  She will get her next check on November 3. She has a grandson who is 31 years old and recently had a bone marrow transplant at [facility] for some type of malignancy.  She could not recall what type.  The family has been very  concerned.  Past Medical History  Diagnosis Date  . Hypertension   . Morbid obesity (HCC)   . Nonsmoker   . Pulmonary embolism (HCC) 2010    following right knee replacement  . DVT (deep venous thrombosis) (HCC)     right upper extremitiy  . Chronic anticoagulation   . Infected prosthetic knee joint (HCC)   . H/O: GI bleed   . Anemia   . IBS (irritable bowel syndrome)   . Hyperlipidemia   . WPW (Wolff-Parkinson-White syndrome)   . Anemia, unspecified 04/23/2011  . Diverticulosis   . Colon polyps may 2011  . Allergic rhinitis, cause unspecified 04/29/2011  . Migraine 04/29/2011  . Gout 04/22/2010  . GERD (gastroesophageal reflux disease) 11/10/2011    Past Surgical History  Procedure Laterality Date  . Right knee replacement  2010    followed by I & D for infection  . Cholecystectomy    . Tubal ligation       Current Outpatient Prescriptions  Medication Sig Dispense Refill  . atorvastatin (LIPITOR) 40 MG tablet Take 1 tablet (40 mg total) by mouth daily. 90 tablet 3  . butalbital-acetaminophen-caffeine (FIORICET, ESGIC) 50-325-40 MG per tablet TAKE 1 TABLET BY MOUTH TWICE DAILY AS NEEDED FOR HEADACHE 14 tablet 0  . colchicine 0.6 MG tablet Take 1 tablet (0.6 mg total) by mouth 2 (two) times daily. 8 tablet 0  . furosemide (LASIX) 40 MG tablet Take 40 mg by mouth daily as needed for fluid.    Marland Kitchen guanFACINE (TENEX) 1 MG tablet Take 1  tablet (1 mg total) by mouth at bedtime. 30 tablet 3  . IMITREX 100 MG tablet Take 1 tablet (100 mg total) by mouth every 2 (two) hours as needed for migraine or headache. May repeat in 2 hours if headache persists or recurs. 10 tablet 11  . Linaclotide (LINZESS) 290 MCG CAPS capsule Take 290 mcg by mouth 2 (two) times a week.    . phytonadione (MEPHYTON) 5 MG tablet Vitamin K (Mephyton ) 5mg   take 1/2 tablet (2.5mg ) now and save remaining 1/2 tablet 1 tablet 0  . polyethylene glycol powder (GLYCOLAX/MIRALAX) powder TAKE 17 GRAMS BY MOUTH DAILY AS  NEEDED FOR CONSTIPATION 527 g 6  . telmisartan (MICARDIS) 80 MG tablet Take 1 tablet (80 mg total) by mouth daily. 90 tablet 1  . warfarin (COUMADIN) 5 MG tablet TAKE 1 1/2 TABLET BY MOUTH, EXCEPT 1 TABLET ON MONDAY AND FRIDAYS OR AS DIRECTED 60 tablet 2   No current facility-administered medications for this visit.    Allergies:   Crestor; Amlodipine; Piperacillin sod-tazobactam so; and Ultram    Social History:  The patient  reports that she has never smoked. She has never used smokeless tobacco. She reports that she does not drink alcohol or use illicit drugs.   Family History:  The patient's family history includes Colon cancer in an other family member; Coronary artery disease in an other family member; Hypertension in her brother, sister, sister, and sister; Pancreatic cancer in her sister.    ROS:  Please see the history of present illness.   Otherwise, review of systems are positive for none.   All other systems are reviewed and negative.    PHYSICAL EXAM: VS:  BP 152/110 mmHg  Pulse 96  Ht 5' 3.5" (1.613 m)  Wt 344 lb 6.4 oz (156.219 kg)  BMI 60.04 kg/m2 , BMI Body mass index is 60.04 kg/(m^2). GEN: Well nourished, well developed, in no acute distress HEENT: normal Neck: no JVD, carotid bruits, or masses Cardiac: RRR; no murmurs, rubs, or gallops,no edema  Respiratory:  clear to auscultation bilaterally, normal work of breathing GI: soft, nontender, nondistended, + BS MS: no deformity or atrophy Skin: warm and dry, no rash Neuro:  Strength and sensation are intact Psych: euthymic mood, full affect   EKG:  EKG is not ordered today.    Recent Labs: 04/30/2014: ALT 11; BUN 16; Creatinine, Ser 1.18; Potassium 3.8; Sodium 141 05/24/2014: Hemoglobin 9.7*; Platelets 431.0*    Lipid Panel    Component Value Date/Time   CHOL 246* 04/30/2014 1647   TRIG 123.0 04/30/2014 1647   HDL 42.10 04/30/2014 1647   CHOLHDL 6 04/30/2014 1647   VLDL 24.6 04/30/2014 1647   LDLCALC  179* 04/30/2014 1647   LDLDIRECT 148.9 12/11/2012 0934      Wt Readings from Last 3 Encounters:  11/04/14 344 lb 6.4 oz (156.219 kg)  07/30/14 343 lb 12.8 oz (155.947 kg)  05/22/14 352 lb (159.666 kg)       ASSESSMENT AND PLAN:  1. 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  Blood pressure is higher today because she has been out of her medicines for several days. 3. Hypercholesterolemia 4. Past history of deep vein thrombosis and pulmonary emboli, on long-term Coumadin. 5. morbid obesity 6. severe constipation  Plan: Continue same medication. Get back on medication as quickly as possible. Recheck in 3 months for follow-up office visit and EKG.   Current medicines are reviewed at length with the patient  today.  The patient does not have concerns regarding medicines.  The following changes have been made:  no change  Labs/ tests ordered today include:   Orders Placed This Encounter  Procedures  . Lipid panel  . Hepatic function panel  . Basic metabolic panel  . CBC with Differential/Platelet    Disposition: Recheck in 3 months for office visit and EKG.  Lab work today is pending  Karie Schwalbe MD 11/04/2014 12:49 PM    [facility] [address] [place], [place], [place]  [address] Phone: [phone]; Fax: [phone]

## 2014-11-04 NOTE — Progress Notes (Signed)
Quick Note:  Please report to patient. The recent labs are stable. Continue same medication and careful diet. ______ 

## 2014-11-22 ENCOUNTER — Ambulatory Visit: Payer: BC Managed Care – PPO | Admitting: Internal Medicine

## 2014-12-03 ENCOUNTER — Ambulatory Visit (INDEPENDENT_AMBULATORY_CARE_PROVIDER_SITE_OTHER): Payer: BC Managed Care – PPO

## 2014-12-03 DIAGNOSIS — Z5181 Encounter for therapeutic drug level monitoring: Secondary | ICD-10-CM

## 2014-12-03 LAB — POCT INR: INR: 1.9

## 2014-12-08 ENCOUNTER — Other Ambulatory Visit: Payer: Self-pay | Admitting: Internal Medicine

## 2014-12-24 ENCOUNTER — Ambulatory Visit: Payer: BC Managed Care – PPO | Admitting: Internal Medicine

## 2015-01-09 ENCOUNTER — Encounter: Payer: Self-pay | Admitting: Gastroenterology

## 2015-01-21 ENCOUNTER — Ambulatory Visit (INDEPENDENT_AMBULATORY_CARE_PROVIDER_SITE_OTHER): Payer: BC Managed Care – PPO

## 2015-01-21 DIAGNOSIS — Z5181 Encounter for therapeutic drug level monitoring: Secondary | ICD-10-CM

## 2015-01-21 LAB — POCT INR: INR: 2

## 2015-01-31 ENCOUNTER — Ambulatory Visit (INDEPENDENT_AMBULATORY_CARE_PROVIDER_SITE_OTHER): Payer: BC Managed Care – PPO | Admitting: *Deleted

## 2015-01-31 DIAGNOSIS — Z5181 Encounter for therapeutic drug level monitoring: Secondary | ICD-10-CM | POA: Diagnosis not present

## 2015-01-31 LAB — POCT INR: INR: 2.7

## 2015-02-03 ENCOUNTER — Ambulatory Visit (INDEPENDENT_AMBULATORY_CARE_PROVIDER_SITE_OTHER): Payer: BC Managed Care – PPO | Admitting: Cardiology

## 2015-02-03 ENCOUNTER — Encounter: Payer: Self-pay | Admitting: Cardiology

## 2015-02-03 VITALS — BP 130/90 | HR 83 | Ht 63.5 in | Wt 347.8 lb

## 2015-02-03 DIAGNOSIS — I456 Pre-excitation syndrome: Secondary | ICD-10-CM

## 2015-02-03 DIAGNOSIS — I119 Hypertensive heart disease without heart failure: Secondary | ICD-10-CM | POA: Diagnosis not present

## 2015-02-03 NOTE — Progress Notes (Signed)
Cardiology Office Note   Date:  02/03/2015   ID:  Mia [name], Mia [name] 17-Aug-1955, MRN [id number]  PCP:  Oliver Barre, MD  Cardiologist: Cassell Clement MD  Chief Complaint  Patient presents with  . routine scheduled visit      History of Present Illness: Mia [name] is a 60 y.o. female who presents for scheduled follow-up office visit   This pleasant 60 year old African American woman is seen for a three-month followup office visit. She has a history of morbid obesity and a complex past medical history which includes deep vein thrombosis and previous pulmonary emboli. She has been on long-term Coumadin. She also has essential hypertension and hypercholesterolemia. She has osteoarthritis and has had a previously infected right knee prosthesis and is followed also by infectious disease and by her orthopedist. Her orthopedist has told her however that he would not consider reoperation for 5 years. Her orthopedist recently put her on permanent disability.  The patient also has a torn right rotator cuff injury of her shoulder but has been told that she would be too high risk for surgical correction. . On a previous visit here her electrocardiogram showed a pattern of WPW although the patient has never had any symptoms from this. However a subsequent electrocardiogram on her subsequent office visit did not show any evidence of WPW. However on the EKG of 12/19/13, the WPW pattern was again seen  She continues to have problems with morbid obesity and with severe constipation. The patient has not been having any recent chest pain. She has occasional left arm pain she has permanent chronic pain in the right knee. She has not been having any symptoms of recurrent pulmonary emboli. Her weight is essentially unchanged over the summer. Since last visit she has been having a lot of GI issues.  She has an appointment to see her gastroenterologist on February 27.  She has had recent  problems with urinary tract infection with hematuria and her gynecologist had treated her for 2 weeks with an antibiotic.  She has been having a lot of postprandial abdominal pain.   Past Medical History  Diagnosis Date  . Hypertension   . Morbid obesity (HCC)   . Nonsmoker   . Pulmonary embolism (HCC) 2010    following right knee replacement  . DVT (deep venous thrombosis) (HCC)     right upper extremitiy  . Chronic anticoagulation   . Infected prosthetic knee joint (HCC)   . H/O: GI bleed   . Anemia   . IBS (irritable bowel syndrome)   . Hyperlipidemia   . WPW (Wolff-Parkinson-White syndrome)   . Anemia, unspecified 04/23/2011  . Diverticulosis   . Colon polyps may 2011  . Allergic rhinitis, cause unspecified 04/29/2011  . Migraine 04/29/2011  . Gout 04/22/2010  . GERD (gastroesophageal reflux disease) 11/10/2011    Past Surgical History  Procedure Laterality Date  . Right knee replacement  2010    followed by I & D for infection  . Cholecystectomy    . Tubal ligation       Current Outpatient Prescriptions  Medication Sig Dispense Refill  . atorvastatin (LIPITOR) 40 MG tablet Take 1 tablet (40 mg total) by mouth daily. 90 tablet 3  . butalbital-acetaminophen-caffeine (FIORICET, ESGIC) 50-325-40 MG per tablet TAKE 1 TABLET BY MOUTH TWICE DAILY AS NEEDED FOR HEADACHE 14 tablet 0  . colchicine 0.6 MG tablet Take 1 tablet (0.6 mg total) by mouth 2 (two) times daily. 8 tablet 0  .  furosemide (LASIX) 40 MG tablet Take 40 mg by mouth daily as needed for fluid.    Marland Kitchen guanFACINE (TENEX) 1 MG tablet TAKE 1 TABLET(1 MG) BY MOUTH AT BEDTIME 30 tablet 5  . IMITREX 100 MG tablet Take 1 tablet (100 mg total) by mouth every 2 (two) hours as needed for migraine or headache. May repeat in 2 hours if headache persists or recurs. 10 tablet 11  . Linaclotide (LINZESS) 290 MCG CAPS capsule Take 290 mcg by mouth 2 (two) times a week.    . phytonadione (MEPHYTON) 5 MG tablet Vitamin K (Mephyton )    take 1/2 tablet (2.5mg ) now and save remaining 1/2 tablet 1 tablet 0  . polyethylene glycol powder (GLYCOLAX/MIRALAX) powder TAKE 17 GRAMS BY MOUTH DAILY AS NEEDED FOR CONSTIPATION 527 g 6  . telmisartan (MICARDIS) 80 MG tablet Take 1 tablet (80 mg total) by mouth daily. 90 tablet 1  . warfarin (COUMADIN) 5 MG tablet TAKE 1 1/2 TABLET BY MOUTH, EXCEPT 1 TABLET ON MONDAY AND FRIDAYS OR AS DIRECTED 60 tablet 2   No current facility-administered medications for this visit.    Allergies:   Crestor; Amlodipine; Piperacillin sod-tazobactam so; and Ultram    Social History:  The patient  reports that she has never smoked. She has never used smokeless tobacco. She reports that she does not drink alcohol or use illicit drugs.   Family History:  The patient's family history includes Hypertension in her brother, sister, sister, and sister; Pancreatic cancer in her sister.    ROS:  Please see the history of present illness.   Otherwise, review of systems are positive for none.   All other systems are reviewed and negative.    PHYSICAL EXAM: VS:  BP 130/90 mmHg  Pulse 83  Ht 5' 3.5" (1.613 m)  Wt 347 lb 12.8 oz (157.761 kg)  BMI 60.64 kg/m2 , BMI Body mass index is 60.64 kg/(m^2). GEN: Well nourished, well developed, in no acute distress.  Morbid obesity.  Pleasant woman. HEENT: normal Neck: no JVD, carotid bruits, or masses Cardiac: RRR; no murmurs, rubs, or gallops,no edema  Respiratory:  clear to auscultation bilaterally, normal work of breathing GI: soft, nontender, nondistended, + BS MS: no deformity or atrophy Skin: warm and dry, no rash Neuro:  Strength and sensation are intact Psych: euthymic mood, full affect   EKG:  EKG is ordered today. The ekg ordered today demonstrates normal sinus rhythm.  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 . 3. Hypercholesterolemia 4. Past history of deep vein thrombosis and pulmonary emboli, on long-term Coumadin. 5. morbid obesity 6. severe constipation 7.  Postprandial epigastric pain.  GI evaluation pending for February 27  Plan: Continue same medication.Continue  current medication.  Following my retirement she will return in 6 months for office visit with Dr. Anne Fu  Current medicines are reviewed at length with the patient today.  The patient does not have concerns regarding medicines.  The following changes have been made:  no change  Labs/ tests ordered today include:   Orders Placed This Encounter  Procedures  . EKG 12-Lead      Signed, Cassell Clement MD 02/03/2015 2:08 PM    [facility] [address] [place], [place], [place]  [address] Phone: [phone]; Fax: [phone]

## 2015-02-03 NOTE — Patient Instructions (Signed)
Medication Instructions:  Your physician recommends that you continue on your current medications as directed. Please refer to the Current Medication list given to you today.  Labwork: NONE  Testing/Procedures: NONE  Follow-Up: Your physician wants you to follow-up in: 6 MONTH OV WITH DR SKAINS You will receive a reminder letter in the mail two months in advance. If you don't receive a letter, please call our office to schedule the follow-up appointment.  If you need a refill on your cardiac medications before your next appointment, please call your pharmacy.  

## 2015-02-03 NOTE — Progress Notes (Deleted)
Cardiology Office Note   Date:  02/03/2015   ID:  Mia, [name] 06-May-1955, MRN [id number]  PCP:  Oliver Barre, MD  Cardiologist: Cassell Clement MD  Chief Complaint  Patient presents with  . routine scheduled visit      History of Present Illness: Mia [name] is a 60 y.o. female who presents for ***    Past Medical History  Diagnosis Date  . Hypertension   . Morbid obesity (HCC)   . Nonsmoker   . Pulmonary embolism (HCC) 2010    following right knee replacement  . DVT (deep venous thrombosis) (HCC)     right upper extremitiy  . Chronic anticoagulation   . Infected prosthetic knee joint (HCC)   . H/O: GI bleed   . Anemia   . IBS (irritable bowel syndrome)   . Hyperlipidemia   . WPW (Wolff-Parkinson-White syndrome)   . Anemia, unspecified 04/23/2011  . Diverticulosis   . Colon polyps may 2011  . Allergic rhinitis, cause unspecified 04/29/2011  . Migraine 04/29/2011  . Gout 04/22/2010  . GERD (gastroesophageal reflux disease) 11/10/2011    Past Surgical History  Procedure Laterality Date  . Right knee replacement  2010    followed by I & D for infection  . Cholecystectomy    . Tubal ligation       Current Outpatient Prescriptions  Medication Sig Dispense Refill  . atorvastatin (LIPITOR) 40 MG tablet Take 1 tablet (40 mg total) by mouth daily. 90 tablet 3  . butalbital-acetaminophen-caffeine (FIORICET, ESGIC) 50-325-40 MG per tablet TAKE 1 TABLET BY MOUTH TWICE DAILY AS NEEDED FOR HEADACHE 14 tablet 0  . colchicine 0.6 MG tablet Take 1 tablet (0.6 mg total) by mouth 2 (two) times daily. 8 tablet 0  . furosemide (LASIX) 40 MG tablet Take 40 mg by mouth daily as needed for fluid.    Marland Kitchen guanFACINE (TENEX) 1 MG tablet TAKE 1 TABLET(1 MG) BY MOUTH AT BEDTIME 30 tablet 5  . IMITREX 100 MG tablet Take 1 tablet (100 mg total) by mouth every 2 (two) hours as needed for migraine or headache. May repeat in 2 hours if headache persists or recurs. 10 tablet  11  . Linaclotide (LINZESS) 290 MCG CAPS capsule Take 290 mcg by mouth 2 (two) times a week.    . phytonadione (MEPHYTON) 5 MG tablet Vitamin K (Mephyton )   take 1/2 tablet (2.5mg ) now and save remaining 1/2 tablet 1 tablet 0  . polyethylene glycol powder (GLYCOLAX/MIRALAX) powder TAKE 17 GRAMS BY MOUTH DAILY AS NEEDED FOR CONSTIPATION 527 g 6  . telmisartan (MICARDIS) 80 MG tablet Take 1 tablet (80 mg total) by mouth daily. 90 tablet 1  . warfarin (COUMADIN) 5 MG tablet TAKE 1 1/2 TABLET BY MOUTH, EXCEPT 1 TABLET ON MONDAY AND FRIDAYS OR AS DIRECTED 60 tablet 2   No current facility-administered medications for this visit.    Allergies:   Crestor; Amlodipine; Piperacillin sod-tazobactam so; and Ultram    Social History:  The patient  reports that she has never smoked. She has never used smokeless tobacco. She reports that she does not drink alcohol or use illicit drugs.   Family History:  The patient's ***family history includes Hypertension in her brother, sister, sister, and sister; Pancreatic cancer in her sister.    ROS:  Please see the history of present illness.   Otherwise, review of systems are positive for {NONE DEFAULTED:18576}.   All other systems are reviewed and  negative.    PHYSICAL EXAM: VS:  BP 130/90 mmHg  Pulse 83  Ht 5' 3.5" (1.613 m)  Wt 347 lb 12.8 oz (157.761 kg)  BMI 60.64 kg/m2 , BMI Body mass index is 60.64 kg/(m^2). GEN: Well nourished, well developed, in no acute distress HEENT: normal Neck: no JVD, carotid bruits, or masses Cardiac: ***RRR; no murmurs, rubs, or gallops,no edema  Respiratory:  clear to auscultation bilaterally, normal work of breathing GI: soft, nontender, nondistended, + BS MS: no deformity or atrophy Skin: warm and dry, no rash Neuro:  Strength and sensation are intact Psych: euthymic mood, full affect   EKG:  EKG {ACTION; IS/IS ZOX:09604540} ordered today. The ekg ordered today demonstrates ***   Recent Labs: 11/04/2014:  ALT 11; BUN 11; Creat 0.72; Hemoglobin 14.3; Platelets 247; Potassium 3.6; Sodium 140    Lipid Panel    Component Value Date/Time   CHOL 151 11/04/2014 1113   TRIG 103 11/04/2014 1113   HDL 39* 11/04/2014 1113   CHOLHDL 3.9 11/04/2014 1113   VLDL 21 11/04/2014 1113   LDLCALC 91 11/04/2014 1113   LDLDIRECT 148.9 12/11/2012 0934      Wt Readings from Last 3 Encounters:  02/03/15 347 lb 12.8 oz (157.761 kg)  11/04/14 344 lb 6.4 oz (156.219 kg)  07/30/14 343 lb 12.8 oz (155.947 kg)      Other studies Reviewed: Additional studies/ records that were reviewed today include: ***. Review of the above records demonstrates: ***   ASSESSMENT AND PLAN:  1.  ***   Current medicines are reviewed at length with the patient today.  The patient {ACTIONS; HAS/DOES NOT HAVE:19233} concerns regarding medicines.  The following changes have been made:  {PLAN; NO CHANGE:13088:s}  Labs/ tests ordered today include: *** Orders Placed This Encounter  Procedures  . EKG 12-Lead     Disposition:   FU with *** in {gen number 9-81:191478} {Days to years:10300}  Signed, Cassell Clement MD 02/03/2015 2:14 PM    [facility] [address] [place], [place], [place]  [address] Phone: [phone]; Fax: [phone]

## 2015-02-04 ENCOUNTER — Other Ambulatory Visit: Payer: Self-pay | Admitting: Internal Medicine

## 2015-02-05 ENCOUNTER — Other Ambulatory Visit: Payer: Self-pay | Admitting: Cardiology

## 2015-03-03 ENCOUNTER — Ambulatory Visit: Payer: Medicare Other | Admitting: Gastroenterology

## 2015-03-14 ENCOUNTER — Ambulatory Visit (INDEPENDENT_AMBULATORY_CARE_PROVIDER_SITE_OTHER): Payer: BC Managed Care – PPO | Admitting: *Deleted

## 2015-03-14 DIAGNOSIS — Z5181 Encounter for therapeutic drug level monitoring: Secondary | ICD-10-CM | POA: Diagnosis not present

## 2015-03-14 LAB — POCT INR: INR: 2.9

## 2015-03-26 ENCOUNTER — Other Ambulatory Visit: Payer: Self-pay | Admitting: Cardiology

## 2015-04-21 ENCOUNTER — Ambulatory Visit (INDEPENDENT_AMBULATORY_CARE_PROVIDER_SITE_OTHER): Payer: BC Managed Care – PPO | Admitting: Gastroenterology

## 2015-04-21 ENCOUNTER — Encounter: Payer: Self-pay | Admitting: Gastroenterology

## 2015-04-21 VITALS — BP 128/88 | HR 78 | Ht 63.5 in | Wt 343.0 lb

## 2015-04-21 DIAGNOSIS — K5900 Constipation, unspecified: Secondary | ICD-10-CM

## 2015-04-21 NOTE — Patient Instructions (Signed)
Please start taking citrucel (orange flavored) powder fiber supplement.  This may cause some bloating at first but that usually goes away. Begin with a small spoonful and work your way up to a large, heaping spoonful daily over a week. Change miralax to one dose once daily. Call in 4-5 weeks to report on your response.  Hopefully you won't linzess. Please return to see Dr. Christella HartiganJacobs in 2-3 months. Stay hydrated, it helps your bowels.

## 2015-04-21 NOTE — Progress Notes (Signed)
Review of pertinent gastrointestinal problems: 1. Routine risk for colon cancer, colonoscopy 2011 Dr. Christella Hartigan done for routine screening found a single small hyperplastic polyp. She was recommended to have repeat colonoscopy at 10 year interval  HPI: This is a  pleasant 60 year old woman  who was referred to me by Corwin Levins, MD  to evaluate  constipation .    Chief complaint is chronic constipation  She has 'lazy bowels' for many years.  If she eats she has brisk gastrocolic reflex.    She has lower back pains.  Constipated her entire life. Has never had normal bowels.  She takes linzess twice per week, takes miralax every three days.    Review of systems: Pertinent positive and negative review of systems were noted in the above HPI section. Complete review of systems was performed and was otherwise normal.   Past Medical History  Diagnosis Date  . Hypertension   . Morbid obesity (HCC)   . Nonsmoker   . Pulmonary embolism (HCC) 2010    following right knee replacement  . DVT (deep venous thrombosis) (HCC)     right upper extremitiy  . Chronic anticoagulation   . Infected prosthetic knee joint (HCC)   . H/O: GI bleed   . Anemia   . IBS (irritable bowel syndrome)   . Hyperlipidemia   . WPW (Wolff-Parkinson-White syndrome)   . Anemia, unspecified 04/23/2011  . Diverticulosis   . Colon polyps may 2011  . Allergic rhinitis, cause unspecified 04/29/2011  . Migraine 04/29/2011  . Gout 04/22/2010  . GERD (gastroesophageal reflux disease) 11/10/2011    Past Surgical History  Procedure Laterality Date  . Right knee replacement  2010    followed by I & D for infection  . Cholecystectomy    . Tubal ligation      Current Outpatient Prescriptions  Medication Sig Dispense Refill  . atorvastatin (LIPITOR) 40 MG tablet Take 1 tablet (40 mg total) by mouth daily. 90 tablet 3  . butalbital-acetaminophen-caffeine (FIORICET, ESGIC) 50-325-40 MG per tablet TAKE 1 TABLET BY MOUTH  TWICE DAILY AS NEEDED FOR HEADACHE 14 tablet 0  . colchicine 0.6 MG tablet Take 1 tablet (0.6 mg total) by mouth 2 (two) times daily. 8 tablet 0  . furosemide (LASIX) 40 MG tablet Take 40 mg by mouth daily as needed for fluid.    Marland Kitchen guanFACINE (TENEX) 1 MG tablet TAKE 1 TABLET(1 MG) BY MOUTH AT BEDTIME 30 tablet 5  . IMITREX 100 MG tablet Take 1 tablet (100 mg total) by mouth every 2 (two) hours as needed for migraine or headache. May repeat in 2 hours if headache persists or recurs. 10 tablet 11  . Linaclotide (LINZESS) 290 MCG CAPS capsule Take 290 mcg by mouth 2 (two) times a week.    . polyethylene glycol powder (GLYCOLAX/MIRALAX) powder TAKE 17 GRAMS BY MOUTH DAILY AS NEEDED FOR CONSTIPATION 527 g 6  . telmisartan (MICARDIS) 80 MG tablet TAKE 1 TABLET(80 MG) BY MOUTH DAILY 90 tablet 0  . warfarin (COUMADIN) 5 MG tablet TAKE 1 1/2 TABLETS BY MOUTH DAILY, EXCEPT 1 TABLET ON MONDAY AND FRIDAY OR AS DIRECTED 50 tablet 3   No current facility-administered medications for this visit.    Allergies as of 04/21/2015 - Review Complete 04/21/2015  Allergen Reaction Noted  . Crestor [rosuvastatin calcium] Other (See Comments) 03/27/2010  . Amlodipine Other (See Comments) 03/27/2010  . Piperacillin sod-tazobactam so Other (See Comments) 02/16/2010  . Ultram [tramadol] Nausea Only 12/15/2012  Family History  Problem Relation Age of Onset  . Colon cancer    . Coronary artery disease    . Pancreatic cancer Sister   . Hypertension Brother   . Hypertension Sister   . Hypertension Sister   . Hypertension Sister     Social History   Social History  . Marital Status: Single    Spouse Name: N/A  . Number of Children: N/A  . Years of Education: N/A   Occupational History  . Not on file.   Social History Main Topics  . Smoking status: Never Smoker   . Smokeless tobacco: Never Used  . Alcohol Use: No  . Drug Use: No  . Sexual Activity: Not on file   Other Topics Concern  . Not on file    Social History Narrative     Physical Exam: BP 128/88 mmHg  Pulse 78  Ht 5' 3.5" (1.613 m)  Wt 343 lb (155.584 kg)  BMI 59.80 kg/m2 Constitutional: generally well-appearing Psychiatric: alert and oriented x3 Eyes: extraocular movements intact Mouth: oral pharynx moist, no lesions Neck: supple no lymphadenopathy Cardiovascular: heart regular rate and rhythm Lungs: clear to auscultation bilaterally Abdomen: soft, nontender, nondistended, no obvious ascites, no peritoneal signs, normal bowel sounds Extremities: no lower extremity edema bilaterally Skin: no lesions on visible extremities   Assessment and plan: 60 y.o. female with  chronic constipation, morbid obesity with a BMI near 60  She has a bowel movement less than once a week unless she takes several different agents to move her bowels. Her bowel regimen really seems to be all over the place with Linzess every 2 or 3 days, MiraLAX every 2 or 3 days. I recommended we simplify this. She is going to start taking a daily fiber supplement beginning today. She will continue MiraLAX but she will take it every single day one dose daily. I'm asking her to not take Linzess except for as a rescue for now. I'm hoping that with these other 2 agents taken on a regular basis she'll be older move her bowels a bit more regular. She was under the assumption that she get her bowels moving easier she will be able to lose weight. I tried to spell her of that explained to her that even if we get her moving her bowels every single day she will still be quite obese and that she should focus on lower calories and increased exercise for her obesity. She will report on her response to the above in 4-5 weeks and she will return to see me in 2-3 months.   Rob Buntinganiel Larry Knipp, MD [facility] 04/21/2015, 2:27 PM  Cc: Corwin LevinsJohn, James W, MD

## 2015-04-25 ENCOUNTER — Ambulatory Visit (INDEPENDENT_AMBULATORY_CARE_PROVIDER_SITE_OTHER): Payer: BC Managed Care – PPO | Admitting: Surgery

## 2015-04-25 DIAGNOSIS — Z5181 Encounter for therapeutic drug level monitoring: Secondary | ICD-10-CM | POA: Diagnosis not present

## 2015-04-25 LAB — POCT INR: INR: 3.8

## 2015-05-03 ENCOUNTER — Other Ambulatory Visit: Payer: Self-pay | Admitting: Internal Medicine

## 2015-05-09 ENCOUNTER — Ambulatory Visit (INDEPENDENT_AMBULATORY_CARE_PROVIDER_SITE_OTHER): Payer: BC Managed Care – PPO | Admitting: *Deleted

## 2015-05-09 DIAGNOSIS — Z5181 Encounter for therapeutic drug level monitoring: Secondary | ICD-10-CM | POA: Diagnosis not present

## 2015-05-09 LAB — PROTIME-INR
INR: 4.53 — ABNORMAL HIGH (ref 0.00–1.49)
PROTHROMBIN TIME: 41.7 s — AB (ref 11.6–15.2)

## 2015-05-09 LAB — POCT INR: INR: 6.4

## 2015-05-19 ENCOUNTER — Ambulatory Visit (INDEPENDENT_AMBULATORY_CARE_PROVIDER_SITE_OTHER): Payer: BC Managed Care – PPO | Admitting: Pharmacist

## 2015-05-19 DIAGNOSIS — Z5181 Encounter for therapeutic drug level monitoring: Secondary | ICD-10-CM

## 2015-05-19 LAB — POCT INR: INR: 3.5

## 2015-06-03 ENCOUNTER — Ambulatory Visit (INDEPENDENT_AMBULATORY_CARE_PROVIDER_SITE_OTHER): Payer: BC Managed Care – PPO | Admitting: *Deleted

## 2015-06-03 DIAGNOSIS — Z5181 Encounter for therapeutic drug level monitoring: Secondary | ICD-10-CM | POA: Diagnosis not present

## 2015-06-03 LAB — POCT INR: INR: 3.4

## 2015-06-07 ENCOUNTER — Other Ambulatory Visit: Payer: Self-pay | Admitting: Internal Medicine

## 2015-06-17 ENCOUNTER — Ambulatory Visit (INDEPENDENT_AMBULATORY_CARE_PROVIDER_SITE_OTHER): Payer: BC Managed Care – PPO | Admitting: Pharmacist

## 2015-06-17 DIAGNOSIS — Z5181 Encounter for therapeutic drug level monitoring: Secondary | ICD-10-CM

## 2015-06-17 LAB — POCT INR: INR: 1.8

## 2015-07-02 ENCOUNTER — Encounter: Payer: Self-pay | Admitting: *Deleted

## 2015-07-04 ENCOUNTER — Ambulatory Visit (INDEPENDENT_AMBULATORY_CARE_PROVIDER_SITE_OTHER): Payer: Worker's Compensation | Admitting: Pharmacist

## 2015-07-04 DIAGNOSIS — Z5181 Encounter for therapeutic drug level monitoring: Secondary | ICD-10-CM

## 2015-07-04 LAB — POCT INR: INR: 1.8

## 2015-07-06 ENCOUNTER — Other Ambulatory Visit: Payer: Self-pay | Admitting: Internal Medicine

## 2015-07-18 ENCOUNTER — Ambulatory Visit (INDEPENDENT_AMBULATORY_CARE_PROVIDER_SITE_OTHER): Payer: BC Managed Care – PPO | Admitting: *Deleted

## 2015-07-18 ENCOUNTER — Ambulatory Visit (INDEPENDENT_AMBULATORY_CARE_PROVIDER_SITE_OTHER): Payer: BC Managed Care – PPO | Admitting: Cardiology

## 2015-07-18 ENCOUNTER — Encounter: Payer: Self-pay | Admitting: Cardiology

## 2015-07-18 VITALS — BP 140/86 | HR 74 | Ht 63.5 in | Wt 338.0 lb

## 2015-07-18 DIAGNOSIS — Z5181 Encounter for therapeutic drug level monitoring: Secondary | ICD-10-CM | POA: Diagnosis not present

## 2015-07-18 DIAGNOSIS — I119 Hypertensive heart disease without heart failure: Secondary | ICD-10-CM | POA: Diagnosis not present

## 2015-07-18 DIAGNOSIS — I456 Pre-excitation syndrome: Secondary | ICD-10-CM

## 2015-07-18 DIAGNOSIS — I82A29 Chronic embolism and thrombosis of unspecified axillary vein: Secondary | ICD-10-CM

## 2015-07-18 DIAGNOSIS — I2699 Other pulmonary embolism without acute cor pulmonale: Secondary | ICD-10-CM

## 2015-07-18 LAB — POCT INR: INR: 1.6

## 2015-07-18 NOTE — Progress Notes (Signed)
Cardiology Office Note    Date:  07/18/2015   ID:  Mia BeringCatherine J [name], DOB December 05, 1955, MRN [id number]  PCP:  Oliver BarreJames John, MD  Cardiologist:   Donato SchultzMark Alishia Lebo, MD     History of Present Illness:  Mia GradCatherine J Hilgert is a 60 y.o. female former patient of Dr. Yevonne [name]'s with history of DVT, PE on long-term Coumadin with history of morbid obesity, hypertension, hyperlipidemia.  Prior right knee prosthesis was infected. Prior rotator cuff injury to high risk for surgical correction.  Occasional pattern of WPW seen on EKG. Has severe constipation. Chronic pain.  Overall she has been doing well from a cardiac perspective. No syncopal, no palpitations. No significant chest pain or shortness of breath.     Past Medical History  Diagnosis Date  . Hypertension   . Morbid obesity (HCC)   . Nonsmoker   . Pulmonary embolism (HCC) 2010    following right knee replacement  . DVT (deep venous thrombosis) (HCC)     right upper extremitiy  . Chronic anticoagulation   . Infected prosthetic knee joint (HCC)   . H/O: GI bleed   . Anemia   . IBS (irritable bowel syndrome)   . Hyperlipidemia   . WPW (Wolff-Parkinson-White syndrome)   . Anemia, unspecified 04/23/2011  . Diverticulosis   . Colon polyps may 2011  . Allergic rhinitis, cause unspecified 04/29/2011  . Migraine 04/29/2011  . Gout 04/22/2010  . GERD (gastroesophageal reflux disease) 11/10/2011    Past Surgical History  Procedure Laterality Date  . Total knee arthroplasty Right 2010    followed by I & D for infection  . Cholecystectomy    . Tubal ligation      Current Medications: Outpatient Prescriptions Prior to Visit  Medication Sig Dispense Refill  . atorvastatin (LIPITOR) 40 MG tablet Take 1 tablet (40 mg total) by mouth daily. 90 tablet 3  . butalbital-acetaminophen-caffeine (FIORICET, ESGIC) 50-325-40 MG per tablet TAKE 1 TABLET BY MOUTH TWICE DAILY AS NEEDED FOR HEADACHE 14 tablet 0  . furosemide (LASIX) 40 MG tablet Take  40 mg by mouth daily as needed for fluid.    Marland Kitchen. guanFACINE (TENEX) 1 MG tablet TAKE 1 TABLET(1 MG) BY MOUTH AT BEDTIME 30 tablet 0  . IMITREX 100 MG tablet Take 1 tablet (100 mg total) by mouth every 2 (two) hours as needed for migraine or headache. May repeat in 2 hours if headache persists or recurs. 10 tablet 11  . telmisartan (MICARDIS) 80 MG tablet TAKE 1 TABLET(80 MG) BY MOUTH DAILY 90 tablet 0  . warfarin (COUMADIN) 5 MG tablet TAKE 1 1/2 TABLETS BY MOUTH DAILY, EXCEPT 1 TABLET ON MONDAY AND FRIDAY OR AS DIRECTED 50 tablet 3  . Linaclotide (LINZESS) 290 MCG CAPS capsule Take 290 mcg by mouth 2 (two) times a week. Reported on 04/25/2015    . polyethylene glycol powder (GLYCOLAX/MIRALAX) powder TAKE 17 GRAMS BY MOUTH DAILY AS NEEDED FOR CONSTIPATION 527 g 6   No facility-administered medications prior to visit.     Allergies:   Crestor; Amlodipine; Piperacillin sod-tazobactam so; and Ultram   Social History   Social History  . Marital Status: Single    Spouse Name: N/A  . Number of Children: N/A  . Years of Education: N/A   Social History Main Topics  . Smoking status: Never Smoker   . Smokeless tobacco: Never Used  . Alcohol Use: No  . Drug Use: No  . Sexual Activity: Not Asked   Other  Topics Concern  . None   Social History Narrative     Family History:  The patient's family history includes Heart Problems in her mother; Hypertension in her brother, sister, sister, and sister; Pancreatic cancer in her sister; Throat cancer in her father.   ROS:   Please see the history of present illness.    ROS All other systems reviewed and are negative.   PHYSICAL EXAM:   VS:  BP 140/86 mmHg  Pulse 74  Ht 5' 3.5" (1.613 m)  Wt 338 lb (153.316 kg)  BMI 58.93 kg/m2   GEN: Well nourished, well developed, in no acute distress HEENT: normal Neck: no JVD, carotid bruits, or masses Cardiac: RRR; no murmurs, rubs, or gallops,no edema  Respiratory:  clear to auscultation bilaterally,  normal work of breathing GI: soft, nontender, nondistended, + BS, morbidly obese MS: no deformity or atrophy Skin: warm and dry, no rash Neuro:  Alert and Oriented x 3, Strength and sensation are intact Psych: euthymic mood, full affect  Wt Readings from Last 3 Encounters:  07/18/15 338 lb (153.316 kg)  04/21/15 343 lb (155.584 kg)  02/03/15 347 lb 12.8 oz (157.761 kg)      Studies/Labs Reviewed:   EKG:  Minimal delta wave appearance to QRS complex.  Recent Labs: 11/04/2014: ALT 11; BUN 11; Creat 0.72; Hemoglobin 14.3; Platelets 247; Potassium 3.6; Sodium 140   Lipid Panel    Component Value Date/Time   CHOL 151 11/04/2014 1113   TRIG 103 11/04/2014 1113   HDL 39* 11/04/2014 1113   CHOLHDL 3.9 11/04/2014 1113   VLDL 21 11/04/2014 1113   LDLCALC 91 11/04/2014 1113   LDLDIRECT 148.9 12/11/2012 0934    Additional studies/ records that were reviewed today include:  Prior office notes reviewed, blood work reviewed, EKGs reviewed    ASSESSMENT:    1. Wolff-Parkinson-White (WPW) syndrome   2. Pulmonary embolism and infarction (HCC)   3. 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 Benign hypertensive heart disease without heart failure      PLAN:  In order of problems listed above:  WPW  - This has been intermittent EKGs, asymptomatic  Hypertensive heart disease without heart failure  - Overall well controlled.  Hyperlipidemia  - No changes made in medications  History of DVT/PE  - Continue with Coumadin. She has been following in our office.  - If workman's comp is okay, I think would be wise to switch her to Xarelto since there is been difficulty in regulating her INRs. This would also allow her to be green leafy vegetables which could help with her chronic constipation. In the past, she has tried these medications but they have been unable to pay given cost. She will place a phone call.  Severe constipation  - Dr.  Christella Hartigan has seen in the past.  Morbid obesity  - Continue to encourage weight loss.  - She has been referred to a weight loss clinic.    Medication Adjustments/Labs and Tests Ordered: Current medicines are reviewed at length with the patient today.  Concerns regarding medicines are outlined above.  Medication changes, Labs and Tests ordered today are listed in the Patient Instructions below. Patient Instructions  Medication Instructions:  The current medical regimen is effective;  continue present plan and medications.  Follow-Up: Follow up in 6months with Dr. Anne Fu.  You will receive a letter in the mail 2 months before you are due.  Please call us when  you receive this letter to schedule your follow up appointment.  If you need a refill on your cardiac medications before your next appointment, please call your pharmacy.  Thank you for choosing [facility]!!           Signed, Donato Schultz, MD  07/18/2015 9:10 AM    [facility] [address] [place], [place], [place]  [address] Phone: [phone]; Fax: [phone]

## 2015-07-18 NOTE — Patient Instructions (Signed)

## 2015-08-01 ENCOUNTER — Ambulatory Visit (INDEPENDENT_AMBULATORY_CARE_PROVIDER_SITE_OTHER): Payer: Worker's Compensation | Admitting: *Deleted

## 2015-08-01 ENCOUNTER — Telehealth: Payer: Self-pay | Admitting: *Deleted

## 2015-08-01 DIAGNOSIS — Z5181 Encounter for therapeutic drug level monitoring: Secondary | ICD-10-CM | POA: Diagnosis not present

## 2015-08-01 LAB — POCT INR: INR: 2.1

## 2015-08-01 MED ORDER — WARFARIN SODIUM 5 MG PO TABS: ORAL_TABLET | ORAL | 3 refills | Status: DC

## 2015-08-04 NOTE — Telephone Encounter (Signed)
Medication filled.  

## 2015-08-09 ENCOUNTER — Other Ambulatory Visit: Payer: Self-pay | Admitting: Internal Medicine

## 2015-08-11 ENCOUNTER — Other Ambulatory Visit: Payer: Self-pay | Admitting: Internal Medicine

## 2015-08-11 ENCOUNTER — Other Ambulatory Visit: Payer: Self-pay | Admitting: Cardiology

## 2015-08-11 MED ORDER — TELMISARTAN 80 MG PO TABS: 80.0000 mg | ORAL_TABLET | Freq: Every day | ORAL | 3 refills | Status: DC

## 2015-08-11 MED ORDER — GUANFACINE HCL 1 MG PO TABS: ORAL_TABLET | ORAL | 11 refills | Status: DC

## 2015-08-11 NOTE — Telephone Encounter (Signed)
Please advise, last seen 05/22/14, last fill 07/07/15

## 2015-08-27 ENCOUNTER — Ambulatory Visit (INDEPENDENT_AMBULATORY_CARE_PROVIDER_SITE_OTHER): Payer: BC Managed Care – PPO | Admitting: Pharmacist

## 2015-08-27 DIAGNOSIS — Z5181 Encounter for therapeutic drug level monitoring: Secondary | ICD-10-CM

## 2015-08-27 LAB — POCT INR: INR: 1.9

## 2015-09-17 ENCOUNTER — Ambulatory Visit (INDEPENDENT_AMBULATORY_CARE_PROVIDER_SITE_OTHER): Payer: BC Managed Care – PPO | Admitting: Pharmacist

## 2015-09-17 DIAGNOSIS — Z5181 Encounter for therapeutic drug level monitoring: Secondary | ICD-10-CM

## 2015-09-17 LAB — POCT INR: INR: 2.5

## 2015-10-01 ENCOUNTER — Ambulatory Visit (INDEPENDENT_AMBULATORY_CARE_PROVIDER_SITE_OTHER): Payer: BC Managed Care – PPO | Admitting: Internal Medicine

## 2015-10-01 VITALS — BP 140/80 | HR 86 | Temp 98.6°F | Resp 20 | Wt 337.0 lb

## 2015-10-01 DIAGNOSIS — Z0001 Encounter for general adult medical examination with abnormal findings: Secondary | ICD-10-CM

## 2015-10-01 DIAGNOSIS — I10 Essential (primary) hypertension: Secondary | ICD-10-CM | POA: Diagnosis not present

## 2015-10-01 DIAGNOSIS — J309 Allergic rhinitis, unspecified: Secondary | ICD-10-CM | POA: Diagnosis not present

## 2015-10-01 DIAGNOSIS — M109 Gout, unspecified: Secondary | ICD-10-CM | POA: Diagnosis not present

## 2015-10-01 MED ORDER — METHYLPREDNISOLONE ACETATE 80 MG/ML IJ SUSP
80.0000 mg | Freq: Once | INTRAMUSCULAR | Status: AC
  Administered 2015-10-01: 80 mg via INTRAMUSCULAR

## 2015-10-01 MED ORDER — ALLOPURINOL 300 MG PO TABS: 300.0000 mg | ORAL_TABLET | Freq: Every day | ORAL | 3 refills | Status: DC

## 2015-10-01 MED ORDER — HYDROCODONE-ACETAMINOPHEN 5-325 MG PO TABS: 1.0000 | ORAL_TABLET | Freq: Four times a day (QID) | ORAL | 0 refills | Status: DC | PRN

## 2015-10-01 MED ORDER — KETOROLAC TROMETHAMINE 30 MG/ML IJ SOLN
30.0000 mg | Freq: Once | INTRAMUSCULAR | Status: AC
  Administered 2015-10-01: 30 mg via INTRAMUSCULAR

## 2015-10-01 MED ORDER — PREDNISONE 10 MG PO TABS: ORAL_TABLET | ORAL | 0 refills | Status: DC

## 2015-10-01 NOTE — Progress Notes (Signed)
Subjective:    Patient ID: Mia GradCatherine J Wurzel, female    DOB: October 13, 1955, 60 y.o.   MRN: [id number]  HPI  Here for wellness and f/u;  Overall doing ok;  Pt denies Chest pain, worsening SOB, DOE, wheezing, orthopnea, PND, worsening LE edema, palpitations, dizziness or syncope.  Pt denies neurological change such as new headache, facial or extremity weakness.  Pt denies polydipsia, polyuria, or low sugar symptoms. Pt states overall good compliance with treatment and medications, good tolerability, and has been trying to follow appropriate diet.  Pt denies worsening depressive symptoms, suicidal ideation or panic. No fever, night sweats, wt loss, loss of appetite, or other constitutional symptoms.  Pt states good ability with ADL's, has low fall risk, home safety reviewed and adequate, no other significant changes in hearing or vision, and only occasionally active with exercise. Sees Dr Charlann Boxerlin yearly for yearly right knee replacement x 2.  Also c/o 3 days onset severe pain left first MTP with red/tender/swelling with the swelling extending up past the ankle.  Markedly painful to walk, sudden onset, sharp, constant, better to sit, no fever or trauma, has hx of gout,  Also, Does have several wks ongoing nasal allergy symptoms with clearish congestion, itch and sneezing, without fever, pain, ST, cough, swelling or wheezing. Past Medical History:  Diagnosis Date  . Allergic rhinitis, cause unspecified 04/29/2011  . Anemia   . Anemia, unspecified 04/23/2011  . Chronic anticoagulation   . Colon polyps may 2011  . Diverticulosis   . DVT (deep venous thrombosis) (HCC)    right upper extremitiy  . GERD (gastroesophageal reflux disease) 11/10/2011  . Gout 04/22/2010  . H/O: GI bleed   . Hyperlipidemia   . Hypertension   . IBS (irritable bowel syndrome)   . Infected prosthetic knee joint (HCC)   . Migraine 04/29/2011  . Morbid obesity (HCC)   . Nonsmoker   . Pulmonary embolism (HCC) 2010   following right  knee replacement  . WPW (Wolff-Parkinson-White syndrome)    Past Surgical History:  Procedure Laterality Date  . CHOLECYSTECTOMY    . TOTAL KNEE ARTHROPLASTY Right 2010   followed by I & D for infection  . TUBAL LIGATION      reports that she has never smoked. She has never used smokeless tobacco. She reports that she does not drink alcohol or use drugs. family history includes Heart Problems in her mother; Hypertension in her brother, sister, sister, and sister; Pancreatic cancer in her sister; Throat cancer in her father. Allergies  Allergen Reactions  . Crestor [Rosuvastatin Calcium] Other (See Comments)    Chest pain  . Amlodipine Other (See Comments)    Edema,ha  . Piperacillin Sod-Tazobactam So Other (See Comments)    REACTION: "UNSURE"  . Ultram [Tramadol] Nausea Only   Current Outpatient Prescriptions on File Prior to Visit  Medication Sig Dispense Refill  . atorvastatin (LIPITOR) 40 MG tablet Take 1 tablet (40 mg total) by mouth daily. 90 tablet 3  . butalbital-acetaminophen-caffeine (FIORICET, ESGIC) 50-325-40 MG per tablet TAKE 1 TABLET BY MOUTH TWICE DAILY AS NEEDED FOR HEADACHE 14 tablet 0  . furosemide (LASIX) 40 MG tablet Take 40 mg by mouth daily as needed for fluid.    Marland Kitchen. guanFACINE (TENEX) 1 MG tablet TAKE 1 TABLET(1 MG) BY MOUTH AT BEDTIME 30 tablet 11  . IMITREX 100 MG tablet Take 1 tablet (100 mg total) by mouth every 2 (two) hours as needed for migraine or headache. May repeat in  2 hours if headache persists or recurs. 10 tablet 11  . telmisartan (MICARDIS) 80 MG tablet Take 1 tablet (80 mg total) by mouth daily. 90 tablet 3  . warfarin (COUMADIN) 5 MG tablet Take as directed by coumadin clinic 45 tablet 3   No current facility-administered medications on file prior to visit.    Review of Systems Constitutional: Negative for increased diaphoresis, or other activity, appetite or siginficant weight change other than noted HENT: Negative for worsening hearing  loss, ear pain, facial swelling, mouth sores and neck stiffness.   Eyes: Negative for other worsening pain, redness or visual disturbance.  Respiratory: Negative for choking or stridor Cardiovascular: Negative for other chest pain and palpitations.  Gastrointestinal: Negative for worsening diarrhea, blood in stool, or abdominal distention Genitourinary: Negative for hematuria, flank pain or change in urine volume.  Musculoskeletal: Negative for myalgias or other joint complaints.  Skin: Negative for other color change and wound or drainage.  Neurological: Negative for syncope and numbness. other than noted Hematological: Negative for adenopathy. or other swelling Psychiatric/Behavioral: Negative for hallucinations, SI, self-injury, decreased concentration or other worsening agitation.      Objective:   Physical Exam BP 140/80   Pulse 86   Temp 98.6 F (37 C) (Oral)   Resp 20   Wt (!) 337 lb (152.9 kg)   SpO2 92%   BMI 58.76 kg/m  VS noted, not ill appearing Constitutional: Pt is oriented to person, place, and time. Appears well-developed and well-nourished, in no significant distress Head: Normocephalic and atraumatic  Eyes: Conjunctivae and EOM are normal. Pupils are equal, round, and reactive to light Right Ear: External ear normal.  Left Ear: External ear normal Nose: Nose normal.  Bilat tm's with mild erythema.  Max sinus areas non tender.  Pharynx with mild erythema, no exudate Mouth/Throat: Oropharynx is clear and moist  Neck: Normal range of motion. Neck supple. No JVD present. No tracheal deviation present or significant neck LA or mass Cardiovascular: Normal rate, regular rhythm, normal heart sounds and intact distal pulses.   Pulmonary/Chest: Effort normal and breath sounds without rales or wheezing  Abdominal: Soft. Bowel sounds are normal. NT. No HSM  Musculoskeletal: Normal range of motion. Exhibits no edema except for left first MTP with 2-3+ red/tender/swelling  without fluctuance, ulcer, drainage Lymphadenopathy: Has no cervical adenopathy.  Neurological: Pt is alert and oriented to person, place, and time. Pt has normal reflexes. No cranial nerve deficit. Motor grossly intact Skin: Skin is warm and dry. No rash noted or new ulcers Psychiatric:  Has normal mood and affect. Behavior is normal.     Assessment & Plan:

## 2015-10-01 NOTE — Patient Instructions (Signed)
You had the toradol for pain and the steroid shot as well  Please take all new medication as prescribed  - the pain medication, prednisone, and the allopurinol  Please continue all other medications as before, and refills have been done if requested.  Please have the pharmacy call with any other refills you may need.  Please continue your efforts at being more active, low cholesterol diet, and weight control.  You are otherwise up to date with prevention measures today.  Please keep your appointments with your specialists as you may have planned  Please go to the LAB in the Basement (turn left off the elevator) for the tests to be done today  You will be contacted by phone if any changes need to be made immediately.  Otherwise, you will receive a letter about your results with an explanation, but please check with MyChart first.  Please remember to sign up for MyChart if you have not done so, as this will be important to you in the future with finding out test results, communicating by private email, and scheduling acute appointments online when needed.  Please return in 1 year for your yearly visit, or sooner if needed

## 2015-10-01 NOTE — Progress Notes (Signed)
Pre visit review using our clinic review tool, if applicable. No additional management support is needed unless otherwise documented below in the visit note. 

## 2015-10-03 NOTE — Assessment & Plan Note (Signed)

## 2015-10-03 NOTE — Assessment & Plan Note (Signed)
Mod to severe, for toradol IM,30, depomedrol IM 80, predpac asd, pain control,  to f/u any worsening symptoms or concerns  In addition to the time spent performing CPE, I spent an additional 25 minutes face to face,in which greater than 50% of this time was spent in counseling and coordination of care for patient's acute illness as documented.

## 2015-10-03 NOTE — Assessment & Plan Note (Signed)
Mild to mod, for steroid tx as above, to f/u any worsening symptoms or concerns 

## 2015-10-03 NOTE — Assessment & Plan Note (Signed)
stable overall by history and exam, recent data reviewed with pt, and pt to continue medical treatment as before,  to f/u any worsening symptoms or concerns BP Readings from Last 3 Encounters:  10/01/15 140/80  07/18/15 140/86  04/21/15 128/88

## 2015-10-07 NOTE — Addendum Note (Signed)
Addended by: Etheleen MayhewOX, Shanin Szymanowski C on: 10/07/2015 08:30 AM   Modules accepted: Orders

## 2015-10-15 ENCOUNTER — Ambulatory Visit (INDEPENDENT_AMBULATORY_CARE_PROVIDER_SITE_OTHER): Payer: BC Managed Care – PPO | Admitting: *Deleted

## 2015-10-15 DIAGNOSIS — Z5181 Encounter for therapeutic drug level monitoring: Secondary | ICD-10-CM | POA: Diagnosis not present

## 2015-10-15 LAB — POCT INR: INR: 2.7

## 2015-11-07 ENCOUNTER — Other Ambulatory Visit: Payer: Self-pay | Admitting: Internal Medicine

## 2015-11-12 ENCOUNTER — Ambulatory Visit (INDEPENDENT_AMBULATORY_CARE_PROVIDER_SITE_OTHER): Payer: BC Managed Care – PPO | Admitting: *Deleted

## 2015-11-12 DIAGNOSIS — Z5181 Encounter for therapeutic drug level monitoring: Secondary | ICD-10-CM | POA: Diagnosis not present

## 2015-11-12 LAB — POCT INR: INR: 2.2

## 2015-11-21 ENCOUNTER — Other Ambulatory Visit: Payer: Self-pay | Admitting: Cardiology

## 2015-12-24 ENCOUNTER — Ambulatory Visit (INDEPENDENT_AMBULATORY_CARE_PROVIDER_SITE_OTHER): Payer: Worker's Compensation | Admitting: Pharmacist

## 2015-12-24 DIAGNOSIS — Z5181 Encounter for therapeutic drug level monitoring: Secondary | ICD-10-CM

## 2015-12-24 LAB — POCT INR: INR: 1.9

## 2015-12-31 NOTE — Telephone Encounter (Signed)
error 

## 2016-01-27 ENCOUNTER — Ambulatory Visit (INDEPENDENT_AMBULATORY_CARE_PROVIDER_SITE_OTHER): Payer: BC Managed Care – PPO | Admitting: *Deleted

## 2016-01-27 DIAGNOSIS — Z5181 Encounter for therapeutic drug level monitoring: Secondary | ICD-10-CM | POA: Diagnosis not present

## 2016-01-27 LAB — POCT INR: INR: 2.8

## 2016-02-24 ENCOUNTER — Ambulatory Visit (INDEPENDENT_AMBULATORY_CARE_PROVIDER_SITE_OTHER): Payer: BC Managed Care – PPO | Admitting: *Deleted

## 2016-02-24 DIAGNOSIS — Z5181 Encounter for therapeutic drug level monitoring: Secondary | ICD-10-CM

## 2016-02-24 LAB — POCT INR: INR: 3.3

## 2016-03-09 ENCOUNTER — Ambulatory Visit (INDEPENDENT_AMBULATORY_CARE_PROVIDER_SITE_OTHER): Payer: Worker's Compensation | Admitting: *Deleted

## 2016-03-09 DIAGNOSIS — Z5181 Encounter for therapeutic drug level monitoring: Secondary | ICD-10-CM

## 2016-03-09 LAB — POCT INR: INR: 3.4

## 2016-03-16 ENCOUNTER — Ambulatory Visit (INDEPENDENT_AMBULATORY_CARE_PROVIDER_SITE_OTHER)
Admission: RE | Admit: 2016-03-16 | Discharge: 2016-03-16 | Disposition: A | Discharge status: Home / Self Care | Payer: BC Managed Care – PPO | Source: Ambulatory Visit | Attending: Internal Medicine | Admitting: Internal Medicine

## 2016-03-16 ENCOUNTER — Ambulatory Visit (INDEPENDENT_AMBULATORY_CARE_PROVIDER_SITE_OTHER): Payer: BC Managed Care – PPO | Admitting: Internal Medicine

## 2016-03-16 ENCOUNTER — Other Ambulatory Visit (INDEPENDENT_AMBULATORY_CARE_PROVIDER_SITE_OTHER): Payer: BC Managed Care – PPO

## 2016-03-16 VITALS — BP 132/82 | HR 75 | Temp 98.4°F | Ht 63.5 in | Wt 342.0 lb

## 2016-03-16 DIAGNOSIS — Z0001 Encounter for general adult medical examination with abnormal findings: Secondary | ICD-10-CM

## 2016-03-16 DIAGNOSIS — M545 Low back pain: Secondary | ICD-10-CM | POA: Diagnosis not present

## 2016-03-16 DIAGNOSIS — R109 Unspecified abdominal pain: Secondary | ICD-10-CM | POA: Insufficient documentation

## 2016-03-16 DIAGNOSIS — E785 Hyperlipidemia, unspecified: Secondary | ICD-10-CM

## 2016-03-16 LAB — URINALYSIS, ROUTINE W REFLEX MICROSCOPIC
BILIRUBIN URINE: NEGATIVE
Ketones, ur: NEGATIVE
Nitrite: NEGATIVE
PH: 6.5 (ref 5.0–8.0)
Specific Gravity, Urine: <=1.005 — AB (ref 1.000–1.030)
TOTAL PROTEIN, URINE-UPE24: NEGATIVE
UROBILINOGEN UA: 0.2 (ref 0.0–1.0)
Urine Glucose: NEGATIVE

## 2016-03-16 LAB — CBC WITH DIFFERENTIAL/PLATELET
BASOS PCT: 1.4 % (ref 0.0–3.0)
Basophils Absolute: 0.1 10*3/uL (ref 0.0–0.1)
Eosinophils Absolute: 0 10*3/uL (ref 0.0–0.7)
Eosinophils Relative: 0.4 % (ref 0.0–5.0)
HCT: 45.5 % (ref 36.0–46.0)
HEMOGLOBIN: 14.9 g/dL (ref 12.0–15.0)
Lymphocytes Relative: 30.6 % (ref 12.0–46.0)
Lymphs Abs: 2.3 10*3/uL (ref 0.7–4.0)
MCHC: 32.8 g/dL (ref 30.0–36.0)
MCV: 84.3 fl (ref 78.0–100.0)
Monocytes Absolute: 0.6 10*3/uL (ref 0.1–1.0)
Monocytes Relative: 7.5 % (ref 3.0–12.0)
Neutro Abs: 4.5 10*3/uL (ref 1.4–7.7)
Neutrophils Relative %: 60.1 % (ref 43.0–77.0)
Platelets: 225 10*3/uL (ref 150.0–400.0)
RBC: 5.4 Mil/uL — AB (ref 3.87–5.11)
RDW: 15.6 % — AB (ref 11.5–15.5)
WBC: 7.5 10*3/uL (ref 4.0–10.5)

## 2016-03-16 LAB — BASIC METABOLIC PANEL
BUN: 12 mg/dL (ref 6–23)
CALCIUM: 9.5 mg/dL (ref 8.4–10.5)
CO2: 31 mEq/L (ref 19–32)
CREATININE: 0.62 mg/dL (ref 0.40–1.20)
Chloride: 105 mEq/L (ref 96–112)
GFR: 126.93 mL/min (ref >60.00)
Glucose, Bld: 89 mg/dL (ref 70–99)
Potassium: 3.7 mEq/L (ref 3.5–5.1)
Sodium: 142 mEq/L (ref 135–145)

## 2016-03-16 LAB — LIPASE: Lipase: 7 U/L — ABNORMAL LOW (ref 11.0–59.0)

## 2016-03-16 LAB — LIPID PANEL
CHOL/HDL RATIO: 7
Cholesterol: 283 mg/dL — ABNORMAL HIGH (ref 0–200)
HDL: 39.8 mg/dL (ref >39.00)
LDL CALC: 204 mg/dL — AB (ref 0–99)
NONHDL: 243.57
TRIGLYCERIDES: 200 mg/dL — AB (ref 0.0–149.0)
VLDL: 40 mg/dL (ref 0.0–40.0)

## 2016-03-16 LAB — HEPATIC FUNCTION PANEL
ALBUMIN: 3.7 g/dL (ref 3.5–5.2)
ALT: 12 U/L (ref 0–35)
AST: 12 U/L (ref 0–37)
Alkaline Phosphatase: 80 U/L (ref 39–117)
Bilirubin, Direct: 0.1 mg/dL (ref 0.0–0.3)
TOTAL PROTEIN: 7.4 g/dL (ref 6.0–8.3)
Total Bilirubin: 0.6 mg/dL (ref 0.2–1.2)

## 2016-03-16 MED ORDER — GUANFACINE HCL 1 MG PO TABS: ORAL_TABLET | ORAL | 1 refills | Status: DC

## 2016-03-16 MED ORDER — LIDOCAINE 5 % EX PTCH: 1.0000 | MEDICATED_PATCH | CUTANEOUS | 1 refills | Status: DC

## 2016-03-16 MED ORDER — LINACLOTIDE 72 MCG PO CAPS
72.0000 ug | ORAL_CAPSULE | Freq: Every day | ORAL | 5 refills | Status: DC
Start: 2016-03-16 — End: 2017-05-09

## 2016-03-16 MED ORDER — LACTULOSE 10 GM/15ML PO SOLN: 30.0000 g | Freq: Three times a day (TID) | ORAL | 5 refills | Status: DC | PRN

## 2016-03-16 MED ORDER — ATORVASTATIN CALCIUM 40 MG PO TABS: 40.0000 mg | ORAL_TABLET | Freq: Every day | ORAL | 3 refills | Status: DC

## 2016-03-16 MED ORDER — ALLOPURINOL 300 MG PO TABS: 300.0000 mg | ORAL_TABLET | Freq: Every day | ORAL | 3 refills | Status: DC

## 2016-03-16 NOTE — Patient Instructions (Signed)
Please take all new medication as prescribed - the lactulose, as well as the low dose linzess in the future after the lactulose works  Please continue all other medications as before, and refills have been done if requested - lidoderm patch  Please have the pharmacy call with any other refills you may need.  Please continue your efforts at being more active, low cholesterol diet, and weight control.  You are otherwise up to date with prevention measures today.  You will be contacted regarding [facility] [place]  Please keep your appointments with your specialists as you may have planned  Please go to the XRAY Department in the Basement (go straight as you get off the elevator) for the x-ray testing - the abdominal xrays  Please go to the LAB in the Basement (turn left off the elevator) for the tests to be done today  You will be contacted by phone if any changes need to be made immediately.  Otherwise, you will receive a letter about your results with an explanation, but please check with MyChart first.  Please remember to sign up for MyChart if you have not done so, as this will be important to you in the future with finding out test results, communicating by private email, and scheduling acute appointments online when needed.

## 2016-03-16 NOTE — Progress Notes (Signed)
Subjective:    Patient ID: Mia [name], female    DOB: 1955-02-26, 61 y.o.   MRN: [id number]  HPI    Here for wellness and f/u;  Overall doing ok;  Pt denies Chest pain, worsening SOB, DOE, wheezing, orthopnea, PND, worsening LE edema, palpitations, dizziness or syncope.  Pt denies neurological change such as new headache, facial or extremity weakness.  Pt denies polydipsia, polyuria, or low sugar symptoms. Pt states overall good compliance with treatment and medications, good tolerability, and has been trying to follow appropriate diet.  Pt denies worsening depressive symptoms, suicidal ideation or panic. No fever, night sweats, wt loss, loss of appetite, or other constitutional symptoms.  Pt states good ability with ADL's, has low fall risk, home safety reviewed and adequate, no other significant changes in hearing or vision, and only occasionally active with exercise  Due for may 2018 pap and mammogram  No other changes to hx except:  Has not taken statin for 6 mo but would take if needed  Also here with 4 days onset lower back pain without radiation, constant, occasainally severe, was crying this am due to pain, position change makes no better or worse. No n/v, diarrhea but abd is more distended, so much she had to have help with putting on socks.  No fever.  Also with constipation, not sure if related. Trying laxatives now but no results in the last day. Did have mild elev INR last wk with reduced coumadin, but no overt bleeding.  Denies urinary symptoms such as dysuria, frequency, urgency, flank pain, hematuria or n/v, fever, chills, but does not always have symptoms.  Frustrated with coumadin diet as does not help her recurring constipation in past.  Did improved with previous visit with conservative tx of lidoderm for back pain and laxative. Last UTI was 2017. Still with left knee pain chronic but ortho does not want to do repeat TKA per pt.  Has been referred to [facility] for wt management.  Last CT  abd 2012 as below.  Last linzess had to stop due to sudden onset ev ery 2 days large mod liquid stools.  Past Medical History:  Diagnosis Date  . Allergic rhinitis, cause unspecified 04/29/2011  . Anemia   . Anemia, unspecified 04/23/2011  . Chronic anticoagulation   . Colon polyps may 2011  . Diverticulosis   . DVT (deep venous thrombosis) (HCC)    right upper extremitiy  . GERD (gastroesophageal reflux disease) 11/10/2011  . Gout 04/22/2010  . H/O: GI bleed   . Hyperlipidemia   . Hypertension   . IBS (irritable bowel syndrome)   . Infected prosthetic knee joint (HCC)   . Migraine 04/29/2011  . Morbid obesity (HCC)   . Nonsmoker   . Pulmonary embolism (HCC) 2010   following right knee replacement  . WPW (Wolff-Parkinson-White syndrome)    Past Surgical History:  Procedure Laterality Date  . CHOLECYSTECTOMY    . TOTAL KNEE ARTHROPLASTY Right 2010   followed by I & D for infection  . TUBAL LIGATION      reports that she has never smoked. She has never used smokeless tobacco. She reports that she does not drink alcohol or use drugs. family history includes Heart Problems in her mother; Hypertension in her brother, sister, sister, and sister; Pancreatic cancer in her sister; Throat cancer in her father. Allergies  Allergen Reactions  . Crestor [Rosuvastatin Calcium] Other (See Comments)    Chest pain  . Amlodipine Other (See  Comments)    Edema,ha  . Piperacillin Sod-Tazobactam So Other (See Comments)    REACTION: "UNSURE"  . Ultram [Tramadol] Nausea Only   Current Outpatient Prescriptions on File Prior to Visit  Medication Sig Dispense Refill  . butalbital-acetaminophen-caffeine (FIORICET, ESGIC) 50-325-40 MG per tablet TAKE 1 TABLET BY MOUTH TWICE DAILY AS NEEDED FOR HEADACHE 14 tablet 0  . furosemide (LASIX) 40 MG tablet Take 40 mg by mouth daily as needed for fluid.    . IMITREX 100 MG tablet Take 1 tablet (100 mg total) by mouth every 2 (two) hours as needed for migraine  or headache. May repeat in 2 hours if headache persists or recurs. 10 tablet 11  . predniSONE (DELTASONE) 10 MG tablet 3 tabs by mouth per day for 3 days,2tabs per day for 3 days,1tab per day for 3 days 18 tablet 0  . telmisartan (MICARDIS) 80 MG tablet Take 1 tablet (80 mg total) by mouth daily. 90 tablet 3  . warfarin (COUMADIN) 5 MG tablet TAKE AS DIRECTED BY COUMADIN CLINIC 45 tablet 3   No current facility-administered medications on file prior to visit.      Review of Systems Constitutional: Negative for increased diaphoresis, or other activity, appetite or siginficant weight change other than noted HENT: Negative for worsening hearing loss, ear pain, facial swelling, mouth sores and neck stiffness.   Eyes: Negative for other worsening pain, redness or visual disturbance.  Respiratory: Negative for choking or stridor Cardiovascular: Negative for other chest pain and palpitations.  Gastrointestinal: Negative for worsening diarrhea, blood in stool, or abdominal distention Genitourinary: Negative for hematuria, flank pain or change in urine volume.  Musculoskeletal: Negative for myalgias or other joint complaints.  Skin: Negative for other color change and wound or drainage.  Neurological: Negative for syncope and numbness. other than noted Hematological: Negative for adenopathy. or other swelling Psychiatric/Behavioral: Negative for hallucinations, SI, self-injury, decreased concentration or other worsening agitation.  All other system neg per pt    Objective:   Physical Exam BP 132/82   Pulse 75   Temp 98.4 F (36.9 C)   Ht 5' 3.5" (1.613 m)   Wt (!) 342 lb (155.1 kg)   SpO2 99%   BMI 59.63 kg/m  VS noted,  Constitutional: Pt is oriented to person, place, and time. Appears well-developed and well-nourished, in no significant distress Head: Normocephalic and atraumatic  Eyes: Conjunctivae and EOM are normal. Pupils are equal, round, and reactive to light Right Ear: External  ear normal.  Left Ear: External ear normal Nose: Nose normal.  Mouth/Throat: Oropharynx is clear and moist  Neck: Normal range of motion. Neck supple. No JVD present. No tracheal deviation present or significant neck LA or mass Cardiovascular: Normal rate, regular rhythm, normal heart sounds and intact distal pulses.   Pulmonary/Chest: Effort normal and breath sounds without rales or wheezing  Abdominal: Soft. Bowel sounds are normal. NT. No HSM  Musculoskeletal: Normal range of motion. Exhibits no edema, spine nontender, no lumbar paravertebral tender Lymphadenopathy: Has no cervical adenopathy.  Neurological: Pt is alert and oriented to person, place, and time. Pt has normal reflexes. No cranial nerve deficit. Motor grossly intact, sens/dtr intact and symmetric Skin: Skin is warm and dry. No rash noted or new ulcers Psychiatric:  Has normal mood and affect. Behavior is normal.  No other exam findings  09/12/2010 9:49 PM 09/12/2010 10:11 PM  PACS Images   Show images for CT Abdomen Pelvis Wo Contrast  Study Result   *  RADIOLOGY REPORT*  Clinical Data: Back pain, hematuria and weakness.  CT ABDOMEN AND PELVIS WITHOUT CONTRAST  Technique:  Multidetector CT imaging of the abdomen and pelvis was performed following the standard protocol without intravenous contrast.  Comparison: Abdominal radiograph performed 02/06/2010  Findings: The visualized lung bases are clear.  The liver and spleen are unremarkable in appearance.  The gallbladder is within normal limits.  The pancreas and adrenal glands are unremarkable.  Mild perinephric and more prominent periureteral stranding is noted bilaterally, extending along much of the proximal and mid ureters. There is no evidence of hydronephrosis; no renal or ureteral stones are seen.  Mild right lower pole pelvicaliectasis is noted. Findings likely reflect mild bilateral pyelonephritis and ureteritis.  A vague 2.7 cm hypodensity  with question of associated soft tissue density at the interpole region of the left kidney is not well characterized, and could be better assessed on contrast CT or MRI.  No free fluid is identified.  The small bowel is unremarkable in appearance.  The stomach is within normal limits.  No acute vascular abnormalities are seen.  The appendix is normal in caliber and contains air, without evidence for appendicitis.  The colon is partially distended with stool, and is unremarkable in appearance.  The bladder is is largely decompressed, and appears grossly unremarkable.  The uterus is lobulated in appearance, reflecting multiple fibroids.  The ovaries are symmetric; no suspicious adnexal masses are seen.  Scattered calcifications noted along the ovarian veins bilaterally.  No inguinal lymphadenopathy is seen.  No acute osseous abnormalities are identified.  IMPRESSION:  1.  Mild bilateral perinephric stranding and more prominent bilateral periureteral stranding, likely reflecting mild bilateral pyelonephritis and ureteritis; no evidence of hydronephrosis.  Mild right lower pole pelvicaliectasis noted. 2.  Vague 2.7 cm hypodensity at the interpole region of the left kidney, with question of associated soft tissue density; this is not well characterized, and could be better assessed on contrast CT or MRI, when and as deemed clinically appropriate.  3.  Multiple uterine fibroids noted; scattered calcification noted along the ovarian veins bilaterally.  Original Report Authenticated By: Tonia GhentJEFFREY CHANG, M.D.        Assessment & Plan:

## 2016-03-17 ENCOUNTER — Encounter: Payer: Self-pay | Admitting: Internal Medicine

## 2016-03-17 LAB — TSH: TSH: 1.08 u[IU]/mL (ref 0.35–4.50)

## 2016-03-19 ENCOUNTER — Telehealth: Payer: Self-pay

## 2016-03-19 NOTE — Telephone Encounter (Signed)
None that I am aware, sorry

## 2016-03-19 NOTE — Telephone Encounter (Signed)
PA started via covermymeds.  KEY: [id number]

## 2016-03-19 NOTE — Telephone Encounter (Signed)
PA has come back denied.   Are there any alternative that the patient can try?

## 2016-03-19 NOTE — Telephone Encounter (Signed)
PA started via covermymeds.   [id number]

## 2016-03-19 NOTE — Telephone Encounter (Signed)
PA result came back that it was due to a too early of a fill.

## 2016-03-21 NOTE — Assessment & Plan Note (Signed)

## 2016-03-21 NOTE — Assessment & Plan Note (Signed)
Uncontrolled, for restart lipitor 40 qd, cont diet, to f/u any worsening symptoms or concerns

## 2016-03-21 NOTE — Assessment & Plan Note (Signed)
With ongoing pain chronic stable, for restart lidoderm patch,  to f/u any worsening symptoms or concerns

## 2016-03-21 NOTE — Assessment & Plan Note (Signed)
[facility] per pt request

## 2016-03-21 NOTE — Assessment & Plan Note (Addendum)
Most c/w constipation, for film r/o obostruction, for lactulose prn, to restart linzess low dose if not responsive  In addition to the time spent performing CPE, I spent an additional 15 minutes face to face,in which greater than 50% of this time was spent in counseling and coordination of care for patient's illness as documented.

## 2016-03-23 ENCOUNTER — Ambulatory Visit (INDEPENDENT_AMBULATORY_CARE_PROVIDER_SITE_OTHER): Payer: BC Managed Care – PPO | Admitting: *Deleted

## 2016-03-23 DIAGNOSIS — Z5181 Encounter for therapeutic drug level monitoring: Secondary | ICD-10-CM | POA: Diagnosis not present

## 2016-03-23 LAB — POCT INR: INR: 3

## 2016-03-23 NOTE — Telephone Encounter (Signed)
Tried to call. No vm to leave a message.  

## 2016-03-24 NOTE — Telephone Encounter (Signed)
Called pt back and informed that the denial only means that insurance will not help pay for it. Pt stated understanding and is going to call pharmacy to find out the price.

## 2016-03-24 NOTE — Telephone Encounter (Signed)
Pt would like to know the status.  I told her pa was denied she wants to know what she can do?  Can you call her when you get a chance?

## 2016-04-06 ENCOUNTER — Ambulatory Visit (INDEPENDENT_AMBULATORY_CARE_PROVIDER_SITE_OTHER): Payer: BC Managed Care – PPO | Admitting: *Deleted

## 2016-04-06 DIAGNOSIS — Z5181 Encounter for therapeutic drug level monitoring: Secondary | ICD-10-CM | POA: Diagnosis not present

## 2016-04-06 LAB — POCT INR: INR: 3.1

## 2016-04-20 ENCOUNTER — Ambulatory Visit (INDEPENDENT_AMBULATORY_CARE_PROVIDER_SITE_OTHER): Payer: BC Managed Care – PPO | Admitting: Pharmacist

## 2016-04-20 DIAGNOSIS — Z5181 Encounter for therapeutic drug level monitoring: Secondary | ICD-10-CM

## 2016-04-20 LAB — POCT INR: INR: 2.2

## 2016-04-30 ENCOUNTER — Encounter: Payer: Self-pay | Admitting: Cardiology

## 2016-05-04 ENCOUNTER — Telehealth: Payer: Self-pay | Admitting: *Deleted

## 2016-05-04 ENCOUNTER — Ambulatory Visit (INDEPENDENT_AMBULATORY_CARE_PROVIDER_SITE_OTHER): Payer: Worker's Compensation | Admitting: *Deleted

## 2016-05-04 ENCOUNTER — Encounter (INDEPENDENT_AMBULATORY_CARE_PROVIDER_SITE_OTHER): Payer: Self-pay

## 2016-05-04 ENCOUNTER — Encounter: Payer: Self-pay | Admitting: Cardiology

## 2016-05-04 ENCOUNTER — Ambulatory Visit (INDEPENDENT_AMBULATORY_CARE_PROVIDER_SITE_OTHER): Payer: Worker's Compensation | Admitting: Cardiology

## 2016-05-04 VITALS — BP 124/84 | HR 74 | Ht 63.5 in | Wt 336.6 lb

## 2016-05-04 DIAGNOSIS — Z5181 Encounter for therapeutic drug level monitoring: Secondary | ICD-10-CM

## 2016-05-04 DIAGNOSIS — I456 Pre-excitation syndrome: Secondary | ICD-10-CM | POA: Diagnosis not present

## 2016-05-04 DIAGNOSIS — I10 Essential (primary) hypertension: Secondary | ICD-10-CM | POA: Diagnosis not present

## 2016-05-04 DIAGNOSIS — Z86718 Personal history of other venous thrombosis and embolism: Secondary | ICD-10-CM | POA: Diagnosis not present

## 2016-05-04 LAB — POCT INR: INR: 2.3

## 2016-05-04 NOTE — Progress Notes (Signed)
Cardiology Office Note    Date:  05/04/2016   ID:  Mia, [name] 1955-02-10, MRN [id number]  PCP:  Oliver Barre, MD  Cardiologist:   Donato Schultz, MD     History of Present Illness:  Mia [name] is a 61 y.o. female former patient of Dr. Yevonne Pax with history of DVT, PE on long-term Coumadin with history of morbid obesity, hypertension, hyperlipidemia.  Prior right knee prosthesis was infected. Prior rotator cuff injury to high risk for surgical correction.  Occasional pattern of WPW seen on EKG. Has severe constipation. Chronic pain.  Denies any chest pain, syncopal, orthopnea, PND. She has had some swelling.  Right knee replaced and hurts. Left knee pain. Back pain. Bowels. Impacted.   She states that she occasionally will try to get a boiled egg down. She has never been a 3 meal a day person she states.  Past Medical History:  Diagnosis Date  . Allergic rhinitis, cause unspecified 04/29/2011  . Anemia   . Anemia, unspecified 04/23/2011  . Chronic anticoagulation   . Colon polyps may 2011  . Diverticulosis   . DVT (deep venous thrombosis) (HCC)    right upper extremitiy  . GERD (gastroesophageal reflux disease) 11/10/2011  . Gout 04/22/2010  . H/O: GI bleed   . Hyperlipidemia   . Hypertension   . IBS (irritable bowel syndrome)   . Infected prosthetic knee joint (HCC)   . Migraine 04/29/2011  . Morbid obesity (HCC)   . Nonsmoker   . Pulmonary embolism (HCC) 2010   following right knee replacement  . WPW (Wolff-Parkinson-White syndrome)     Past Surgical History:  Procedure Laterality Date  . CHOLECYSTECTOMY    . TOTAL KNEE ARTHROPLASTY Right 2010   followed by I & D for infection  . TUBAL LIGATION      Current Medications: Outpatient Medications Prior to Visit  Medication Sig Dispense Refill  . allopurinol (ZYLOPRIM) 300 MG tablet Take 1 tablet (300 mg total) by mouth daily. 90 tablet 3  . atorvastatin (LIPITOR) 40 MG tablet Take 1 tablet (40  mg total) by mouth daily. 90 tablet 3  . butalbital-acetaminophen-caffeine (FIORICET, ESGIC) 50-325-40 MG per tablet TAKE 1 TABLET BY MOUTH TWICE DAILY AS NEEDED FOR HEADACHE 14 tablet 0  . furosemide (LASIX) 40 MG tablet Take 40 mg by mouth daily as needed for fluid.    Marland Kitchen guanFACINE (TENEX) 1 MG tablet TAKE 1 TABLET(1 MG) BY MOUTH AT BEDTIME 90 tablet 1  . IMITREX 100 MG tablet Take 1 tablet (100 mg total) by mouth every 2 (two) hours as needed for migraine or headache. May repeat in 2 hours if headache persists or recurs. 10 tablet 11  . lactulose (CHRONULAC) 10 GM/15ML solution Take 45 mLs (30 g total) by mouth 3 (three) times daily as needed for mild constipation. 240 mL 5  . lidocaine (LIDODERM) 5 % Place 1 patch onto the skin daily. Remove & Discard patch within 12 hours or as directed by MD 90 patch 1  . linaclotide (LINZESS) 72 MCG capsule Take 1 capsule (72 mcg total) by mouth daily before breakfast. 30 capsule 5  . predniSONE (DELTASONE) 10 MG tablet 3 tabs by mouth per day for 3 days,2tabs per day for 3 days,1tab per day for 3 days 18 tablet 0  . telmisartan (MICARDIS) 80 MG tablet Take 1 tablet (80 mg total) by mouth daily. 90 tablet 3  . warfarin (COUMADIN) 5 MG tablet TAKE AS DIRECTED BY  COUMADIN CLINIC 45 tablet 3   No facility-administered medications prior to visit.      Allergies:   Crestor [rosuvastatin calcium]; Amlodipine; Piperacillin sod-tazobactam so; and Ultram [tramadol]   Social History   Social History  . Marital status: Single    Spouse name: N/A  . Number of children: N/A  . Years of education: N/A   Social History Main Topics  . Smoking status: Never Smoker  . Smokeless tobacco: Never Used  . Alcohol use No  . Drug use: No  . Sexual activity: Not Asked   Other Topics Concern  . None   Social History Narrative  . None     Family History:  The patient's family history includes Heart Problems in her mother; Hypertension in her brother, sister,  sister, and sister; Pancreatic cancer in her sister; Throat cancer in her father.   ROS:   Please see the history of present illness.    ROS unless specified above all other review of systems negative.   PHYSICAL EXAM:   VS:  BP 124/84   Pulse 74   Ht 5' 3.5" (1.613 m)   Wt (!) 336 lb 9.6 oz (152.7 kg)   BMI 58.69 kg/m    GEN: Well nourished, well developed, in no acute distress  HEENT: normal  Neck: no JVD, carotid bruits, or masses Cardiac: RRR; no murmurs, rubs, or gallops,no edema  Respiratory:  clear to auscultation bilaterally, normal work of breathing GI: soft, nontender, nondistended, + BS, morbidly obese MS: no deformity or atrophy obese, knee scar right noted Skin: warm and dry, no rash Neuro:  Alert and Oriented x 3, Strength and sensation are intact Psych: euthymic mood, full affect  Wt Readings from Last 3 Encounters:  05/04/16 (!) 336 lb 9.6 oz (152.7 kg)  03/16/16 (!) 342 lb (155.1 kg)  10/01/15 (!) 337 lb (152.9 kg)      Studies/Labs Reviewed:   EKG:  EKG today on 05/04/17 shows sinus rhythm 74 with minimal appearing delta wave personally viewed-prior Minimal delta wave appearance to QRS complex.  Recent Labs: 03/16/2016: ALT 12; BUN 12; Creatinine, Ser 0.62; Hemoglobin 14.9; Platelets 225.0; Potassium 3.7; Sodium 142; TSH 1.08   Lipid Panel    Component Value Date/Time   CHOL 283 (H) 03/16/2016 1429   TRIG 200.0 (H) 03/16/2016 1429   HDL 39.80 03/16/2016 1429   CHOLHDL 7 03/16/2016 1429   VLDL 40.0 03/16/2016 1429   LDLCALC 204 (H) 03/16/2016 1429   LDLDIRECT 148.9 12/11/2012 0934    Additional studies/ records that were reviewed today include:  Prior office notes reviewed, blood work reviewed, EKGs reviewed    ASSESSMENT:    1. Essential hypertension   2. Wolff-Parkinson-White (WPW) syndrome   3. History of DVT (deep vein thrombosis)   4. Morbid obesity, unspecified obesity type (HCC)      PLAN:  In order of problems listed  above:  WPW pattern, mild  - This has been intermittent EKGs, asymptomatic. Avoid beta blockers.  - Dx. Previously, Dr. Patty Sermons  Hypertensive heart disease without heart failure  - Overall well controlled.  - no changes.   Hyperlipidemia  - No changes made in medications  History of DVT/PE  - Continue with Coumadin. She has been following in our office.  -  I think would be wise to switch her to Xarelto since there is been difficulty in regulating her INRs. This would also allow her to be green leafy vegetables which could help with her  chronic constipation. In the past, she has tried these medications but they have been unable to pay given cost.   - She has had edema. This is multifactorial, obesity playing a role.  - She has a varicose vein that is new to her in her right lower extremity. It is somewhat tender. Compressible.  Severe constipation  - Dr. Christella Hartigan has seen in the past.  - Encourage fiber.  Morbid obesity  - Continue to encourage weight loss.  - Has tried to go to weight loss clinic in the past.    Medication Adjustments/Labs and Tests Ordered: Current medicines are reviewed at length with the patient today.  Concerns regarding medicines are outlined above.  Medication changes, Labs and Tests ordered today are listed in the Patient Instructions below. Patient Instructions  Medication Instructions:  The current medical regimen is effective;  continue present plan and medications.  Follow-Up: Follow up in 1 year with Dr. Anne Fu.  You will receive a letter in the mail 2 months before you are due.  Please call us when you receive this letter to schedule your follow up appointment.  If you need a refill on your cardiac medications before your next appointment, please call your pharmacy.  Thank you for choosing [facility]!!        Signed, Donato Schultz, MD  05/04/2016 11:36 AM    [facility] [address], [place], [place]   [address] Phone: [phone]; Fax: [phone]

## 2016-05-04 NOTE — Telephone Encounter (Signed)
Pt left msg on triage stating saw MD couple months afo he was supposed to refer her to [facility] for weight management. Pt states she has never heard anything from the referral.../lmb

## 2016-05-04 NOTE — Telephone Encounter (Signed)
Unfortunately, I dont have any influence about this  I will forward this to our PCC's to see if can help

## 2016-05-04 NOTE — Patient Instructions (Signed)

## 2016-05-05 ENCOUNTER — Other Ambulatory Visit: Payer: Self-pay | Admitting: Internal Medicine

## 2016-05-05 DIAGNOSIS — E669 Obesity, unspecified: Secondary | ICD-10-CM

## 2016-05-05 NOTE — Telephone Encounter (Signed)
You stated in her last office visit that you would put referral in for [facility] weight mgmt. No referral was placed. Can you please put in referral

## 2016-05-05 NOTE — Telephone Encounter (Signed)
There is no referral for this pt. Can you please put one in for [facility] weight mgmt.  thanks

## 2016-05-05 NOTE — Telephone Encounter (Signed)
This was done as documented on Mar 16, 2016  Please let me know if this was done incorrectly, and how to do it correctly if needed thanks

## 2016-05-05 NOTE — Telephone Encounter (Signed)
I decline since I would not know how to refer a pt for this besides to a Nutrition clinic as I did on mar 13, thanks  As I said, if you know a better way to accomplish this, please let me know

## 2016-05-06 NOTE — Telephone Encounter (Signed)
New referral was placed.  [facility]. They said to fax the referral to [phone] then have the pt call them to schedule for the seminar [phone] Referral faxed. Unable to leave msg on either phones for pt Will try again later

## 2016-05-10 ENCOUNTER — Encounter: Payer: Self-pay | Admitting: Internal Medicine

## 2016-05-15 ENCOUNTER — Other Ambulatory Visit: Payer: Self-pay | Admitting: Cardiology

## 2016-05-25 ENCOUNTER — Ambulatory Visit (INDEPENDENT_AMBULATORY_CARE_PROVIDER_SITE_OTHER): Payer: Worker's Compensation

## 2016-05-25 DIAGNOSIS — I2699 Other pulmonary embolism without acute cor pulmonale: Secondary | ICD-10-CM

## 2016-05-25 DIAGNOSIS — Z5181 Encounter for therapeutic drug level monitoring: Secondary | ICD-10-CM

## 2016-05-25 LAB — POCT INR: INR: 6.9

## 2016-05-25 LAB — PROTIME-INR
INR: 6.8 (ref 0.8–1.2)
Prothrombin Time: 66.9 s — ABNORMAL HIGH (ref 9.1–12.0)

## 2016-06-01 ENCOUNTER — Ambulatory Visit (INDEPENDENT_AMBULATORY_CARE_PROVIDER_SITE_OTHER): Payer: Worker's Compensation | Admitting: *Deleted

## 2016-06-01 DIAGNOSIS — Z5181 Encounter for therapeutic drug level monitoring: Secondary | ICD-10-CM

## 2016-06-01 LAB — POCT INR: INR: 2.6

## 2016-06-15 ENCOUNTER — Ambulatory Visit (INDEPENDENT_AMBULATORY_CARE_PROVIDER_SITE_OTHER): Payer: Worker's Compensation | Admitting: *Deleted

## 2016-06-15 DIAGNOSIS — Z5181 Encounter for therapeutic drug level monitoring: Secondary | ICD-10-CM

## 2016-06-15 LAB — POCT INR: INR: 3.9

## 2016-06-29 ENCOUNTER — Ambulatory Visit (INDEPENDENT_AMBULATORY_CARE_PROVIDER_SITE_OTHER): Payer: Worker's Compensation | Admitting: Pharmacist

## 2016-06-29 DIAGNOSIS — I8290 Acute embolism and thrombosis of unspecified vein: Secondary | ICD-10-CM

## 2016-06-29 DIAGNOSIS — Z5181 Encounter for therapeutic drug level monitoring: Secondary | ICD-10-CM

## 2016-06-29 LAB — POCT INR: INR: 4.5

## 2016-07-13 ENCOUNTER — Ambulatory Visit (INDEPENDENT_AMBULATORY_CARE_PROVIDER_SITE_OTHER): Payer: Worker's Compensation

## 2016-07-13 ENCOUNTER — Encounter (INDEPENDENT_AMBULATORY_CARE_PROVIDER_SITE_OTHER): Payer: Self-pay

## 2016-07-13 DIAGNOSIS — Z5181 Encounter for therapeutic drug level monitoring: Secondary | ICD-10-CM

## 2016-07-13 DIAGNOSIS — I8290 Acute embolism and thrombosis of unspecified vein: Secondary | ICD-10-CM | POA: Diagnosis not present

## 2016-07-13 LAB — POCT INR: INR: 3.8

## 2016-07-27 ENCOUNTER — Ambulatory Visit (INDEPENDENT_AMBULATORY_CARE_PROVIDER_SITE_OTHER): Payer: Worker's Compensation | Admitting: *Deleted

## 2016-07-27 DIAGNOSIS — Z5181 Encounter for therapeutic drug level monitoring: Secondary | ICD-10-CM | POA: Diagnosis not present

## 2016-07-27 DIAGNOSIS — I8290 Acute embolism and thrombosis of unspecified vein: Secondary | ICD-10-CM | POA: Diagnosis not present

## 2016-07-27 LAB — POCT INR: INR: 4.5

## 2016-08-07 ENCOUNTER — Other Ambulatory Visit: Payer: Self-pay | Admitting: Cardiology

## 2016-08-10 ENCOUNTER — Ambulatory Visit (INDEPENDENT_AMBULATORY_CARE_PROVIDER_SITE_OTHER): Payer: Worker's Compensation | Admitting: Pharmacist

## 2016-08-10 DIAGNOSIS — I8290 Acute embolism and thrombosis of unspecified vein: Secondary | ICD-10-CM | POA: Diagnosis not present

## 2016-08-10 DIAGNOSIS — Z5181 Encounter for therapeutic drug level monitoring: Secondary | ICD-10-CM | POA: Diagnosis not present

## 2016-08-10 LAB — POCT INR: INR: 3.8

## 2016-08-24 ENCOUNTER — Ambulatory Visit (INDEPENDENT_AMBULATORY_CARE_PROVIDER_SITE_OTHER): Payer: Worker's Compensation | Admitting: *Deleted

## 2016-08-24 DIAGNOSIS — Z5181 Encounter for therapeutic drug level monitoring: Secondary | ICD-10-CM

## 2016-08-24 DIAGNOSIS — I8290 Acute embolism and thrombosis of unspecified vein: Secondary | ICD-10-CM

## 2016-08-24 LAB — POCT INR: INR: 2

## 2016-09-08 ENCOUNTER — Ambulatory Visit (INDEPENDENT_AMBULATORY_CARE_PROVIDER_SITE_OTHER): Payer: BC Managed Care – PPO

## 2016-09-08 DIAGNOSIS — I8290 Acute embolism and thrombosis of unspecified vein: Secondary | ICD-10-CM | POA: Diagnosis not present

## 2016-09-08 DIAGNOSIS — Z5181 Encounter for therapeutic drug level monitoring: Secondary | ICD-10-CM | POA: Diagnosis not present

## 2016-09-08 LAB — POCT INR: INR: 1.9

## 2016-09-29 ENCOUNTER — Ambulatory Visit (INDEPENDENT_AMBULATORY_CARE_PROVIDER_SITE_OTHER): Payer: Worker's Compensation | Admitting: *Deleted

## 2016-09-29 DIAGNOSIS — I8290 Acute embolism and thrombosis of unspecified vein: Secondary | ICD-10-CM

## 2016-09-29 DIAGNOSIS — Z5181 Encounter for therapeutic drug level monitoring: Secondary | ICD-10-CM | POA: Diagnosis not present

## 2016-09-29 LAB — POCT INR: INR: 1.4

## 2016-10-14 ENCOUNTER — Other Ambulatory Visit: Payer: Self-pay | Admitting: Cardiology

## 2016-10-14 ENCOUNTER — Ambulatory Visit (INDEPENDENT_AMBULATORY_CARE_PROVIDER_SITE_OTHER): Payer: Worker's Compensation | Admitting: Pharmacist

## 2016-10-14 DIAGNOSIS — I8290 Acute embolism and thrombosis of unspecified vein: Secondary | ICD-10-CM

## 2016-10-14 DIAGNOSIS — Z5181 Encounter for therapeutic drug level monitoring: Secondary | ICD-10-CM | POA: Diagnosis not present

## 2016-10-14 LAB — POCT INR: INR: 1.5

## 2016-10-14 MED ORDER — WARFARIN SODIUM 5 MG PO TABS: ORAL_TABLET | ORAL | 0 refills | Status: DC

## 2016-10-28 ENCOUNTER — Ambulatory Visit (INDEPENDENT_AMBULATORY_CARE_PROVIDER_SITE_OTHER): Payer: Worker's Compensation | Admitting: Pharmacist

## 2016-10-28 DIAGNOSIS — I8290 Acute embolism and thrombosis of unspecified vein: Secondary | ICD-10-CM

## 2016-10-28 DIAGNOSIS — Z5181 Encounter for therapeutic drug level monitoring: Secondary | ICD-10-CM

## 2016-10-28 LAB — POCT INR: INR: 2.1

## 2016-11-29 ENCOUNTER — Ambulatory Visit (INDEPENDENT_AMBULATORY_CARE_PROVIDER_SITE_OTHER): Payer: BC Managed Care – PPO | Admitting: *Deleted

## 2016-11-29 DIAGNOSIS — Z5181 Encounter for therapeutic drug level monitoring: Secondary | ICD-10-CM

## 2016-11-29 DIAGNOSIS — I8290 Acute embolism and thrombosis of unspecified vein: Secondary | ICD-10-CM | POA: Diagnosis not present

## 2016-11-29 LAB — POCT INR: INR: 1.7

## 2016-11-29 NOTE — Patient Instructions (Signed)
Today take 2 tablets, then change your dose to 1 tablet daily except 1.5 tablets on Mondays and Fridays.  Recheck in 2 weeks. Call with any new medications or scheduled procedures  [phone]

## 2016-12-29 ENCOUNTER — Ambulatory Visit (INDEPENDENT_AMBULATORY_CARE_PROVIDER_SITE_OTHER): Payer: BC Managed Care – PPO

## 2016-12-29 DIAGNOSIS — I8290 Acute embolism and thrombosis of unspecified vein: Secondary | ICD-10-CM | POA: Diagnosis not present

## 2016-12-29 DIAGNOSIS — Z5181 Encounter for therapeutic drug level monitoring: Secondary | ICD-10-CM

## 2016-12-29 LAB — POCT INR: INR: 2.6

## 2016-12-29 NOTE — Patient Instructions (Signed)
Continue on same dosage 1 tablet daily except 1.5 tablets on Mondays and Fridays.  Recheck in 4 weeks. Call with any new medications or scheduled procedures  [phone]

## 2017-01-21 ENCOUNTER — Other Ambulatory Visit: Payer: Self-pay | Admitting: Internal Medicine

## 2017-01-26 ENCOUNTER — Ambulatory Visit (INDEPENDENT_AMBULATORY_CARE_PROVIDER_SITE_OTHER): Payer: BC Managed Care – PPO | Admitting: *Deleted

## 2017-01-26 DIAGNOSIS — I8290 Acute embolism and thrombosis of unspecified vein: Secondary | ICD-10-CM

## 2017-01-26 DIAGNOSIS — Z5181 Encounter for therapeutic drug level monitoring: Secondary | ICD-10-CM

## 2017-01-26 LAB — POCT INR: INR: 2.1

## 2017-01-26 NOTE — Patient Instructions (Signed)
Description   Continue on same dosage 1 tablet daily except 1.5 tablets on Mondays and Fridays.  Recheck in 4 weeks. Call with any new medications or scheduled procedures  [phone]

## 2017-02-03 ENCOUNTER — Other Ambulatory Visit: Payer: Self-pay | Admitting: Cardiology

## 2017-02-23 ENCOUNTER — Ambulatory Visit (INDEPENDENT_AMBULATORY_CARE_PROVIDER_SITE_OTHER): Payer: BC Managed Care – PPO | Admitting: *Deleted

## 2017-02-23 ENCOUNTER — Encounter (INDEPENDENT_AMBULATORY_CARE_PROVIDER_SITE_OTHER): Payer: Self-pay

## 2017-02-23 DIAGNOSIS — Z5181 Encounter for therapeutic drug level monitoring: Secondary | ICD-10-CM

## 2017-02-23 DIAGNOSIS — I8290 Acute embolism and thrombosis of unspecified vein: Secondary | ICD-10-CM | POA: Diagnosis not present

## 2017-02-23 LAB — POCT INR: INR: 2.8

## 2017-02-23 NOTE — Patient Instructions (Signed)
Description   Continue on same dosage 1 tablet daily except 1.5 tablets on Mondays and Fridays.  Recheck in 6 weeks. Call with any new medications or scheduled procedures  [phone]

## 2017-04-06 ENCOUNTER — Ambulatory Visit (INDEPENDENT_AMBULATORY_CARE_PROVIDER_SITE_OTHER): Payer: BC Managed Care – PPO | Admitting: *Deleted

## 2017-04-06 DIAGNOSIS — I8290 Acute embolism and thrombosis of unspecified vein: Secondary | ICD-10-CM

## 2017-04-06 DIAGNOSIS — Z5181 Encounter for therapeutic drug level monitoring: Secondary | ICD-10-CM | POA: Diagnosis not present

## 2017-04-06 LAB — POCT INR: INR: 3.3

## 2017-04-06 NOTE — Patient Instructions (Addendum)
Description   Do not take coumadin today April 3rd then continue on same dosage 1 tablet daily except 1.5 tablets on Mondays and Fridays.  Recheck in 2 weeks. Call with any new medications or scheduled procedures  [phone]

## 2017-04-20 ENCOUNTER — Ambulatory Visit (INDEPENDENT_AMBULATORY_CARE_PROVIDER_SITE_OTHER): Payer: BC Managed Care – PPO | Admitting: Pharmacist

## 2017-04-20 DIAGNOSIS — I8290 Acute embolism and thrombosis of unspecified vein: Secondary | ICD-10-CM | POA: Diagnosis not present

## 2017-04-20 DIAGNOSIS — Z5181 Encounter for therapeutic drug level monitoring: Secondary | ICD-10-CM | POA: Diagnosis not present

## 2017-04-20 LAB — POCT INR: INR: 2.6

## 2017-04-20 NOTE — Patient Instructions (Signed)
Description   Continue on same dosage 1 tablet daily except 1.5 tablets on Mondays and Fridays.  Recheck in 4 weeks. Call with any new medications or scheduled procedures  [phone]

## 2017-04-26 ENCOUNTER — Encounter: Payer: Self-pay | Admitting: Cardiology

## 2017-04-28 ENCOUNTER — Other Ambulatory Visit: Payer: Self-pay | Admitting: Internal Medicine

## 2017-05-09 ENCOUNTER — Encounter: Payer: Self-pay | Admitting: Cardiology

## 2017-05-09 ENCOUNTER — Ambulatory Visit (INDEPENDENT_AMBULATORY_CARE_PROVIDER_SITE_OTHER): Payer: BC Managed Care – PPO | Admitting: Cardiology

## 2017-05-09 ENCOUNTER — Ambulatory Visit (INDEPENDENT_AMBULATORY_CARE_PROVIDER_SITE_OTHER): Payer: BC Managed Care – PPO | Admitting: *Deleted

## 2017-05-09 VITALS — BP 128/88 | HR 70 | Ht 63.5 in | Wt 325.0 lb

## 2017-05-09 DIAGNOSIS — I8290 Acute embolism and thrombosis of unspecified vein: Secondary | ICD-10-CM | POA: Diagnosis not present

## 2017-05-09 DIAGNOSIS — I456 Pre-excitation syndrome: Secondary | ICD-10-CM

## 2017-05-09 DIAGNOSIS — Z86718 Personal history of other venous thrombosis and embolism: Secondary | ICD-10-CM | POA: Diagnosis not present

## 2017-05-09 DIAGNOSIS — I10 Essential (primary) hypertension: Secondary | ICD-10-CM | POA: Diagnosis not present

## 2017-05-09 DIAGNOSIS — I119 Hypertensive heart disease without heart failure: Secondary | ICD-10-CM

## 2017-05-09 DIAGNOSIS — Z5181 Encounter for therapeutic drug level monitoring: Secondary | ICD-10-CM | POA: Diagnosis not present

## 2017-05-09 LAB — POCT INR: INR: 3.3

## 2017-05-09 MED ORDER — TELMISARTAN 80 MG PO TABS: 80.0000 mg | ORAL_TABLET | Freq: Every day | ORAL | 3 refills | Status: DC

## 2017-05-09 MED ORDER — ATORVASTATIN CALCIUM 40 MG PO TABS: 40.0000 mg | ORAL_TABLET | Freq: Every day | ORAL | 3 refills | Status: DC

## 2017-05-09 MED ORDER — POTASSIUM CHLORIDE CRYS ER 20 MEQ PO TBCR: 20.0000 meq | EXTENDED_RELEASE_TABLET | Freq: Every day | ORAL | 3 refills | Status: DC

## 2017-05-09 NOTE — Patient Instructions (Signed)
Description   Skip tomorrow's dose, then Continue on same dosage 1 tablet daily except 1.5 tablets on Mondays and Fridays.  Recheck in 3 weeks. Call with any new medications or scheduled procedures  [phone]

## 2017-05-09 NOTE — Progress Notes (Signed)
Cardiology Office Note    Date:  05/09/2017   ID:  Mia [name], Mia [name] 21-Nov-1955, MRN [id number]  PCP:  Corwin Levins, MD  Cardiologist:   Donato Schultz, MD     History of Present Illness:  Mia [name] is a 62 y.o. female former patient of Dr. 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  Prior right knee prosthesis was infected. Prior rotator cuff injury to high risk for surgical correction.  Occasional pattern of WPW seen on EKG. Has severe constipation. Chronic pain.  Denies any chest pain, syncopal, orthopnea, PND. She has had some swelling.  Right knee replaced and hurts. Left knee pain. Back pain. Bowels. Impacted.   She states that she occasionally will try to get a boiled egg down. She has never been a 3 meal a day person she states.  05/09/2017- overall she has been doing fairly well.  She still has that tenderness in the right pretibial region varicose vein that she was mentioning last visit.  She also has a knee recall, sometimes her knee locks up on her.  She has chronic constipation has not had a bowel movement in 7 days.  No fevers chills nausea vomiting syncope.  She states that she only eats one meal a day.  Past Medical History:  Diagnosis Date  . Allergic rhinitis, cause unspecified 04/29/2011  . Anemia   . Anemia, unspecified 04/23/2011  . Chronic anticoagulation   . Colon polyps may 2011  . Diverticulosis   . DVT (deep venous thrombosis) (HCC)    right upper extremitiy  . GERD (gastroesophageal reflux disease) 11/10/2011  . Gout 04/22/2010  . H/O: GI bleed   . Hyperlipidemia   . Hypertension   . IBS (irritable bowel syndrome)   . Infected prosthetic knee joint (HCC)   . Migraine 04/29/2011  . Morbid obesity (HCC)   . Nonsmoker   . Pulmonary embolism (HCC) 2010   following right knee replacement  . WPW (Wolff-Parkinson-White syndrome)     Past  Surgical History:  Procedure Laterality Date  . CHOLECYSTECTOMY    . TOTAL KNEE ARTHROPLASTY Right 2010   followed by I & D for infection  . TUBAL LIGATION      Current Medications: Outpatient Medications Prior to Visit  Medication Sig Dispense Refill  . allopurinol (ZYLOPRIM) 300 MG tablet Take 1 tablet (300 mg total) by mouth daily. 90 tablet 3  . butalbital-acetaminophen-caffeine (FIORICET, ESGIC) 50-325-40 MG per tablet TAKE 1 TABLET BY MOUTH TWICE DAILY AS NEEDED FOR HEADACHE 14 tablet 0  . furosemide (LASIX) 40 MG tablet Take 40 mg by mouth daily as needed for fluid.    Marland Kitchen guanFACINE (TENEX) 1 MG tablet TAKE 1 TABLET(1 MG) BY MOUTH AT BEDTIME 90 tablet 2  . IMITREX 100 MG tablet Take 1 tablet (100 mg total) by mouth every 2 (two) hours as needed for migraine or headache. May repeat in 2 hours if headache persists or recurs. 10 tablet 11  . lactulose (CHRONULAC) 10 GM/15ML solution Take 45 mLs (30 g total) by mouth 3 (three) times daily as needed for mild constipation. 240 mL 5  . lidocaine (LIDODERM) 5 % Place 1 patch onto the skin daily. Remove & Discard patch within 12 hours or as directed by MD 90 patch 1  . warfarin (COUMADIN) 5 MG tablet TAKE 1 TO 1 AND 1/2 TABLETS BY MOUTH DAILY AS DIRECTED BY COUMADIN  CLINIC 135 tablet 1  . GENERLAC 10 GM/15ML SOLN Take 45 mLs by mouth 3 (three) times daily.    Marland Kitchen guanFACINE (TENEX) 1 MG tablet TAKE 1 TABLET(1 MG) BY MOUTH AT BEDTIME 90 tablet 1  . telmisartan (MICARDIS) 80 MG tablet TAKE 1 TABLET(80 MG) BY MOUTH DAILY 90 tablet 0  . atorvastatin (LIPITOR) 40 MG tablet Take 1 tablet (40 mg total) by mouth daily. (Patient not taking: Reported on 05/09/2017) 90 tablet 3  . linaclotide (LINZESS) 72 MCG capsule Take 1 capsule (72 mcg total) by mouth daily before breakfast. (Patient not taking: Reported on 05/09/2017) 30 capsule 5  . predniSONE (DELTASONE) 10 MG tablet 3 tabs by mouth per day for 3 days,2tabs per day for 3 days,1tab per day for 3 days  (Patient not taking: Reported on 05/09/2017) 18 tablet 0   No facility-administered medications prior to visit.      Allergies:   Crestor [rosuvastatin calcium]; Amlodipine; Piperacillin sod-tazobactam so; and Ultram [tramadol]   Social History   Socioeconomic History  . Marital status: Single    Spouse name: Not on file  . Number of children: Not on file  . Years of education: Not on file  . Highest education level: Not on file  Occupational History  . Not on file  Social Needs  . Financial resource strain: Not on file  . Food insecurity:    Worry: Not on file    Inability: Not on file  . Transportation needs:    Medical: Not on file    Non-medical: Not on file  Tobacco Use  . Smoking status: Never Smoker  . Smokeless tobacco: Never Used  Substance and Sexual Activity  . Alcohol use: No  . Drug use: No  . Sexual activity: Not on file  Lifestyle  . Physical activity:    Days per week: Not on file    Minutes per session: Not on file  . Stress: Not on file  Relationships  . Social connections:    Talks on phone: Not on file    Gets together: Not on file    Attends religious service: Not on file    Active member of club or organization: Not on file    Attends meetings of clubs or organizations: Not on file    Relationship status: Not on file  Other Topics Concern  . Not on file  Social History Narrative  . Not on file     Family History:  The patient's family history includes Colon cancer in her unknown relative; Coronary artery disease in her unknown relative; Heart Problems in her mother; Hypertension in her brother, sister, sister, and sister; Pancreatic cancer in her sister; Throat cancer in her father.   ROS:   Please see the history of present illness.    Review of Systems  All other systems reviewed and are negative.  unless specified above all other review of systems negative.   PHYSICAL EXAM:   VS:  BP 128/88   Pulse 70   Ht 5' 3.5" (1.613 m)   Wt  (!) 325 lb (147.4 kg)   BMI 56.67 kg/m    GEN: Well nourished, well developed, in no acute distress  HEENT: normal  Neck: no JVD, carotid bruits, or masses Cardiac: RRR; no murmurs, rubs, or gallops,no edema  Respiratory:  clear to auscultation bilaterally, normal work of breathing GI: soft, nontender, nondistended, + BS, morbidly obese MS: no deformity or atrophy obese, knee scar right noted Skin: warm  and dry, no rash Neuro:  Alert and Oriented x 3, Strength and sensation are intact Psych: euthymic mood, full affect  Wt Readings from Last 3 Encounters:  05/09/17 (!) 325 lb (147.4 kg)  05/04/16 (!) 336 lb 9.6 oz (152.7 kg)  03/16/16 (!) 342 lb (155.1 kg)      Studies/Labs Reviewed:   EKG:  EKG today on 05/09/2017-sinus rhythm 70 no other significant abnormalities.  Personally viewed-prior 05/04/17 shows sinus rhythm 74 with minimal appearing delta wave personally viewed-prior Minimal delta wave appearance to QRS complex.  Recent Labs: No results found for requested labs within last 8760 hours.   Lipid Panel    Component Value Date/Time   CHOL 283 (H) 03/16/2016 1429   TRIG 200.0 (H) 03/16/2016 1429   HDL 39.80 03/16/2016 1429   CHOLHDL 7 03/16/2016 1429   VLDL 40.0 03/16/2016 1429   LDLCALC 204 (H) 03/16/2016 1429   LDLDIRECT 148.9 12/11/2012 0934    Additional studies/ records that were reviewed today include:  Prior office notes reviewed, blood work reviewed, EKGs reviewed    ASSESSMENT:    1. Essential hypertension   2. History of DVT (deep vein thrombosis)   3. Wolff-Parkinson-White (WPW) syndrome   4. Morbid obesity, unspecified obesity type (HCC)   5. Benign hypertensive heart disease without heart failure      PLAN:  In order of problems listed above:  WPW pattern, mild  - This has been intermittent EKGs, asymptomatic. Avoid beta blockers.  - Dx. Previously, Dr. Patty Sermons.  No changes.  Hypertensive heart disease without heart failure  - Overall well  controlled.  - no changes.  Overall doing well.  Occasionally she will take furosemide 40 mg as needed.  We will give her potassium supplementation 20 mEq to take with this, sometimes she feels drained after taking it.  Hyperlipidemia  - No changes made in medications.  LDL last checked by primary doctor, Dr. Jonny Ruiz on 03/16/2016 was 204.  She states that she had not been taking her atorvastatin.  We will restart.  40 mg  History of DVT/PE  - Continue with Coumadin. She has been following in our office.  -  I think would be wise to switch her to Xarelto since there is been difficulty in regulating her INRs. This would also allow her to be green leafy vegetables which could help with her chronic constipation. In the past, she has tried these medications but they have been unable to pay given cost.  Could revisit this at a later date.  - She has had edema. This is multifactorial, obesity playing a role.  - She has a varicose vein in her right lower extremity. It is somewhat tender. Compressible.  She mentioned this today again.  Severe constipation  - Dr. Christella Hartigan has seen in the past.  - Encourage fiber.  MiraLAX.  Morbid obesity  - Continue to encourage weight loss.  - Has tried to go to weight loss clinic in the past.  She states that she eats 1 meal a day.    Medication Adjustments/Labs and Tests Ordered: Current medicines are reviewed at length with the patient today.  Concerns regarding medicines are outlined above.  Medication changes, Labs and Tests ordered today are listed in the Patient Instructions below. Patient Instructions  Medication Instructions:  Please start potassium chloride 20 MEQ when taking Furosemide. Continue all other medications as listed.  Follow-Up: Follow up in 1 year with Dr. Anne Fu.  You will receive a letter in  the mail 2 months before you are due.  Please call us when you receive this letter to schedule your follow up appointment.  If you need a refill on  your cardiac medications before your next appointment, please call your pharmacy.  Thank you for choosing [facility]!!        Signed, Donato Schultz, MD  05/09/2017 8:55 AM    [facility] [address] [place], [place], [place]  [address] Phone: [phone]; Fax: [phone]

## 2017-05-09 NOTE — Patient Instructions (Signed)
Medication Instructions:  Please start potassium chloride 20 MEQ when taking Furosemide. Continue all other medications as listed.  Follow-Up: Follow up in 1 year with Dr. Anne Fu.  You will receive a letter in the mail 2 months before you are due.  Please call us when you receive this letter to schedule your follow up appointment.  If you need a refill on your cardiac medications before your next appointment, please call your pharmacy.  Thank you for choosing [facility]!!

## 2017-05-11 ENCOUNTER — Other Ambulatory Visit: Payer: Self-pay | Admitting: Cardiology

## 2017-05-31 ENCOUNTER — Ambulatory Visit (INDEPENDENT_AMBULATORY_CARE_PROVIDER_SITE_OTHER): Payer: BC Managed Care – PPO | Admitting: *Deleted

## 2017-05-31 DIAGNOSIS — I8290 Acute embolism and thrombosis of unspecified vein: Secondary | ICD-10-CM

## 2017-05-31 DIAGNOSIS — Z5181 Encounter for therapeutic drug level monitoring: Secondary | ICD-10-CM

## 2017-05-31 LAB — POCT INR: INR: 3.2 — AB (ref 2.0–3.0)

## 2017-05-31 NOTE — Patient Instructions (Addendum)
Description   Do not take coumadin tomorrow May 29th as she has already taken  Coumadin today then change coumadin dose to  1 tablet daily except 1.5 tablets only on  Fridays.  Recheck in 2 weeks. Call with any new medications or scheduled procedures  [phone]

## 2017-06-14 ENCOUNTER — Ambulatory Visit (INDEPENDENT_AMBULATORY_CARE_PROVIDER_SITE_OTHER): Payer: BC Managed Care – PPO

## 2017-06-14 DIAGNOSIS — I8290 Acute embolism and thrombosis of unspecified vein: Secondary | ICD-10-CM | POA: Diagnosis not present

## 2017-06-14 DIAGNOSIS — Z5181 Encounter for therapeutic drug level monitoring: Secondary | ICD-10-CM | POA: Diagnosis not present

## 2017-06-14 LAB — POCT INR: INR: 2 (ref 2.0–3.0)

## 2017-06-14 NOTE — Patient Instructions (Signed)
Description   Continue on same dosage 1 tablet daily except 1.5 tablets on  Fridays.  Recheck in 3 weeks. Call with any new medications or scheduled procedures  [phone]

## 2017-07-05 ENCOUNTER — Ambulatory Visit (INDEPENDENT_AMBULATORY_CARE_PROVIDER_SITE_OTHER): Payer: BC Managed Care – PPO | Admitting: *Deleted

## 2017-07-05 DIAGNOSIS — I8290 Acute embolism and thrombosis of unspecified vein: Secondary | ICD-10-CM | POA: Diagnosis not present

## 2017-07-05 DIAGNOSIS — Z5181 Encounter for therapeutic drug level monitoring: Secondary | ICD-10-CM

## 2017-07-05 LAB — POCT INR: INR: 2 (ref 2.0–3.0)

## 2017-07-05 NOTE — Patient Instructions (Signed)
Description   Continue on same dosage 1 tablet daily except 1.5 tablets on  Fridays.  Recheck in 3 weeks. Call with any new medications or scheduled procedures  [phone]

## 2017-07-09 ENCOUNTER — Other Ambulatory Visit: Payer: Self-pay | Admitting: Cardiology

## 2017-07-26 ENCOUNTER — Ambulatory Visit (INDEPENDENT_AMBULATORY_CARE_PROVIDER_SITE_OTHER): Payer: BC Managed Care – PPO | Admitting: *Deleted

## 2017-07-26 ENCOUNTER — Encounter (INDEPENDENT_AMBULATORY_CARE_PROVIDER_SITE_OTHER): Payer: Self-pay

## 2017-07-26 DIAGNOSIS — Z5181 Encounter for therapeutic drug level monitoring: Secondary | ICD-10-CM | POA: Diagnosis not present

## 2017-07-26 DIAGNOSIS — I8290 Acute embolism and thrombosis of unspecified vein: Secondary | ICD-10-CM

## 2017-07-26 LAB — POCT INR: INR: 2 (ref 2.0–3.0)

## 2017-07-26 NOTE — Patient Instructions (Signed)
Description   Continue on same dosage 1 tablet daily except 1.5 tablets on  Fridays.  Recheck in 4 weeks. Call with any new medications or scheduled procedures  [phone]

## 2017-08-23 ENCOUNTER — Ambulatory Visit (INDEPENDENT_AMBULATORY_CARE_PROVIDER_SITE_OTHER): Payer: BC Managed Care – PPO | Admitting: *Deleted

## 2017-08-23 ENCOUNTER — Telehealth: Payer: Self-pay | Admitting: *Deleted

## 2017-08-23 DIAGNOSIS — I8290 Acute embolism and thrombosis of unspecified vein: Secondary | ICD-10-CM | POA: Diagnosis not present

## 2017-08-23 DIAGNOSIS — Z5181 Encounter for therapeutic drug level monitoring: Secondary | ICD-10-CM

## 2017-08-23 LAB — POCT INR: INR: 2.4 (ref 2.0–3.0)

## 2017-08-23 MED ORDER — LOSARTAN POTASSIUM 100 MG PO TABS: 100.0000 mg | ORAL_TABLET | Freq: Every day | ORAL | 3 refills | Status: DC

## 2017-08-23 NOTE — Telephone Encounter (Signed)
Pt states Micardis 80 mg daily is too expensive and is asking for a lower cost medication for the treatment of her HTN.  Will review with Dr Anne FuSkains and call back with any new orders.

## 2017-08-23 NOTE — Patient Instructions (Signed)
Description   Continue on same dosage 1 tablet daily except 1.5 tablets on  Fridays.  Recheck in 5 weeks. Call with any new medications or scheduled procedures  [phone]

## 2017-08-23 NOTE — Telephone Encounter (Signed)
Pt aware of medication change from Micardis to Losartan 100 mg once daily.  Rx sent into pharmacy.  She will c/b if further needs or concerns.

## 2017-08-24 NOTE — Telephone Encounter (Signed)
Agree with change. Discussed  Donato SchultzMark Sina Sumpter, MD

## 2017-09-27 ENCOUNTER — Ambulatory Visit (INDEPENDENT_AMBULATORY_CARE_PROVIDER_SITE_OTHER): Payer: BC Managed Care – PPO | Admitting: *Deleted

## 2017-09-27 DIAGNOSIS — I8290 Acute embolism and thrombosis of unspecified vein: Secondary | ICD-10-CM | POA: Diagnosis not present

## 2017-09-27 DIAGNOSIS — Z5181 Encounter for therapeutic drug level monitoring: Secondary | ICD-10-CM | POA: Diagnosis not present

## 2017-09-27 LAB — POCT INR: INR: 2.8 (ref 2.0–3.0)

## 2017-09-27 NOTE — Patient Instructions (Signed)
Description   Continue on same dosage 1 tablet daily except 1.5 tablets on  Fridays.  Recheck in 6 weeks. Call with any new medications or scheduled procedures  [phone]

## 2017-11-08 ENCOUNTER — Ambulatory Visit (INDEPENDENT_AMBULATORY_CARE_PROVIDER_SITE_OTHER): Payer: BC Managed Care – PPO | Admitting: *Deleted

## 2017-11-08 DIAGNOSIS — I8290 Acute embolism and thrombosis of unspecified vein: Secondary | ICD-10-CM | POA: Diagnosis not present

## 2017-11-08 DIAGNOSIS — Z5181 Encounter for therapeutic drug level monitoring: Secondary | ICD-10-CM | POA: Diagnosis not present

## 2017-11-08 LAB — POCT INR: INR: 2.8 (ref 2.0–3.0)

## 2017-11-08 NOTE — Patient Instructions (Signed)
Description   Continue on same dosage 1 tablet daily except 1.5 tablets on  Fridays.  Recheck in 6 weeks. Call with any new medications or scheduled procedures  [phone]     

## 2017-12-20 ENCOUNTER — Ambulatory Visit (INDEPENDENT_AMBULATORY_CARE_PROVIDER_SITE_OTHER): Payer: BC Managed Care – PPO

## 2017-12-20 DIAGNOSIS — I8290 Acute embolism and thrombosis of unspecified vein: Secondary | ICD-10-CM | POA: Diagnosis not present

## 2017-12-20 DIAGNOSIS — Z5181 Encounter for therapeutic drug level monitoring: Secondary | ICD-10-CM | POA: Diagnosis not present

## 2017-12-20 LAB — POCT INR: INR: 1.9 — AB (ref 2.0–3.0)

## 2017-12-20 NOTE — Patient Instructions (Addendum)
Description   Take an extra 1/2 tablet today, then resume same dosage 1 tablet daily except 1.5 tablets on  Fridays.  Recheck in 4 weeks. Call with any new medications or scheduled procedures  [phone]

## 2018-01-17 ENCOUNTER — Ambulatory Visit (INDEPENDENT_AMBULATORY_CARE_PROVIDER_SITE_OTHER): Payer: BC Managed Care – PPO | Admitting: *Deleted

## 2018-01-17 DIAGNOSIS — I8290 Acute embolism and thrombosis of unspecified vein: Secondary | ICD-10-CM

## 2018-01-17 DIAGNOSIS — Z5181 Encounter for therapeutic drug level monitoring: Secondary | ICD-10-CM

## 2018-01-17 LAB — POCT INR: INR: 1.5 — AB (ref 2.0–3.0)

## 2018-01-17 NOTE — Patient Instructions (Signed)
Description   Take an extra 1/2 tablet today and tomorrow, then start taking 1 tablet daily except 1.5 tablets on Mondays and Fridays.  Recheck in 2 weeks. Call with any new medications or scheduled procedures  [phone]

## 2018-01-31 ENCOUNTER — Ambulatory Visit (INDEPENDENT_AMBULATORY_CARE_PROVIDER_SITE_OTHER): Payer: BC Managed Care – PPO | Admitting: *Deleted

## 2018-01-31 DIAGNOSIS — Z5181 Encounter for therapeutic drug level monitoring: Secondary | ICD-10-CM | POA: Diagnosis not present

## 2018-01-31 DIAGNOSIS — I8290 Acute embolism and thrombosis of unspecified vein: Secondary | ICD-10-CM | POA: Diagnosis not present

## 2018-01-31 LAB — POCT INR: INR: 1.6 — AB (ref 2.0–3.0)

## 2018-01-31 NOTE — Patient Instructions (Signed)
Description   Take 1.5 tablets today and 1.5 tablets tomorrow then start taking 1 tablet daily except 1.5 tablets on Sundays, Tuesdays, and Fridays.  Recheck in 2 weeks. Call with any new medications or scheduled procedures  [phone]

## 2018-02-14 ENCOUNTER — Ambulatory Visit (INDEPENDENT_AMBULATORY_CARE_PROVIDER_SITE_OTHER): Payer: BC Managed Care – PPO

## 2018-02-14 DIAGNOSIS — Z5181 Encounter for therapeutic drug level monitoring: Secondary | ICD-10-CM | POA: Diagnosis not present

## 2018-02-14 DIAGNOSIS — I8290 Acute embolism and thrombosis of unspecified vein: Secondary | ICD-10-CM | POA: Diagnosis not present

## 2018-02-14 LAB — POCT INR: INR: 2.8 (ref 2.0–3.0)

## 2018-02-14 NOTE — Patient Instructions (Signed)
Please continue 1 tablet daily except 1.5 tablets on Sundays, Tuesdays, and Fridays.  Recheck in 3 weeks. Call with any new medications or scheduled procedures  [phone]

## 2018-03-07 ENCOUNTER — Ambulatory Visit (INDEPENDENT_AMBULATORY_CARE_PROVIDER_SITE_OTHER): Payer: BC Managed Care – PPO | Admitting: Pharmacist

## 2018-03-07 DIAGNOSIS — Z5181 Encounter for therapeutic drug level monitoring: Secondary | ICD-10-CM | POA: Diagnosis not present

## 2018-03-07 DIAGNOSIS — I8290 Acute embolism and thrombosis of unspecified vein: Secondary | ICD-10-CM

## 2018-03-07 LAB — POCT INR: INR: 1.9 — AB (ref 2.0–3.0)

## 2018-03-07 NOTE — Patient Instructions (Signed)
Description   Take 1.5 tablet today (3/3), and 1.5 tablets tomorrow (3/4), then continue taking 1 tablet daily except 1.5 tablets on Sundays, Tuesdays, and Fridays.  Recheck in 3 weeks. Call with any new medications or scheduled procedures  [phone]

## 2018-03-08 ENCOUNTER — Other Ambulatory Visit: Payer: Self-pay | Admitting: Cardiology

## 2018-03-27 ENCOUNTER — Telehealth: Payer: Self-pay

## 2018-03-27 NOTE — Telephone Encounter (Signed)
LMOM FOR PRESCREEN AND MADE AWARE OF DRIVE THRU 

## 2018-03-28 ENCOUNTER — Ambulatory Visit (INDEPENDENT_AMBULATORY_CARE_PROVIDER_SITE_OTHER): Payer: BC Managed Care – PPO | Admitting: Pharmacist

## 2018-03-28 ENCOUNTER — Other Ambulatory Visit: Payer: Self-pay

## 2018-03-28 DIAGNOSIS — I8290 Acute embolism and thrombosis of unspecified vein: Secondary | ICD-10-CM

## 2018-03-28 DIAGNOSIS — Z5181 Encounter for therapeutic drug level monitoring: Secondary | ICD-10-CM

## 2018-03-28 LAB — POCT INR: INR: 2.8 (ref 2.0–3.0)

## 2018-04-07 ENCOUNTER — Telehealth: Payer: Self-pay | Admitting: Pharmacist

## 2018-04-07 DIAGNOSIS — I8290 Acute embolism and thrombosis of unspecified vein: Secondary | ICD-10-CM

## 2018-04-07 MED ORDER — APIXABAN 2.5 MG PO TABS: 2.5000 mg | ORAL_TABLET | Freq: Two times a day (BID) | ORAL | 1 refills | Status: DC

## 2018-04-07 NOTE — Telephone Encounter (Signed)
Received fax from worker's comp that they will cover Eliquis. Pt qualifies for Eliquis 2.5mg  BID dosing for prevention of recurrent PE. Called pharmacy who stated it was rejecting for "invalid prior auth information." Will wait a day or two to see if information just needs to be updated from worker's comp side still. Will follow up on Monday, worker's comp # if needed is [phone].

## 2018-04-10 NOTE — Telephone Encounter (Signed)
Called pharmacy and Eliquis is now being processed for $0 copay with worker's comp. Pt took warfarin already today. Will have her stop warfarin and start taking Eliquis 2.5mg  twice daily - first dose tomorrow PM.   Scheduled BMET and CBC in 3 months to ensure labs remain stable.

## 2018-04-19 ENCOUNTER — Other Ambulatory Visit: Payer: Self-pay | Admitting: Internal Medicine

## 2018-07-10 ENCOUNTER — Other Ambulatory Visit: Payer: Self-pay

## 2018-07-10 ENCOUNTER — Encounter (INDEPENDENT_AMBULATORY_CARE_PROVIDER_SITE_OTHER): Payer: Self-pay

## 2018-07-10 ENCOUNTER — Other Ambulatory Visit: Payer: BC Managed Care – PPO | Admitting: *Deleted

## 2018-07-10 ENCOUNTER — Telehealth: Payer: Self-pay | Admitting: *Deleted

## 2018-07-10 DIAGNOSIS — I8290 Acute embolism and thrombosis of unspecified vein: Secondary | ICD-10-CM

## 2018-07-10 LAB — CBC
Hematocrit: 43.3 % (ref 34.0–46.6)
Hemoglobin: 14.7 g/dL (ref 11.1–15.9)
MCH: 28.4 pg (ref 26.6–33.0)
MCHC: 33.9 g/dL (ref 31.5–35.7)
MCV: 84 fL (ref 79–97)
Platelets: 232 10*3/uL (ref 150–450)
RBC: 5.18 x10E6/uL (ref 3.77–5.28)
RDW: 13.7 % (ref 11.7–15.4)
WBC: 9.6 10*3/uL (ref 3.4–10.8)

## 2018-07-10 LAB — BASIC METABOLIC PANEL
BUN/Creatinine Ratio: 22 (ref 12–28)
BUN: 16 mg/dL (ref 8–27)
CO2: 26 mmol/L (ref 20–29)
Calcium: 9.6 mg/dL (ref 8.7–10.3)
Chloride: 104 mmol/L (ref 96–106)
Creatinine, Ser: 0.74 mg/dL (ref 0.57–1.00)
GFR calc Af Amer: 102 mL/min/{1.73_m2} (ref >59)
GFR calc non Af Amer: 88 mL/min/{1.73_m2} (ref >59)
Glucose: 100 mg/dL — ABNORMAL HIGH (ref 65–99)
Potassium: 4 mmol/L (ref 3.5–5.2)
Sodium: 143 mmol/L (ref 134–144)

## 2018-07-10 NOTE — Telephone Encounter (Addendum)
Spoke with Lovett Sox and she stated the Eliquis 2.5mg  tab was picked up on 04/10/2018 and that there was a refill on it and it was being processed for 180 tabs. Confirmed what she said to ensure the refill was being processed.  Called patient and before I could speak she stated she was told by the Pharmacy that her Eliquis med could not be processed and she stated she has been calling her Workers Photographer because per her workers comp claim she should never have an issue with getting any med for the issue, advised that I spoke with Pharmacy and I wanted her to know I spoke with Lovena Le at the Pharmacy and she stated that the Eliquis prescription was being processed, she stated well I guess the Cathi Roan has gotten her message and called the pharmacy and taken care of the matter.  Advised patient to call back if any other issue as we know she needs to take her Eliquis. Patient has labs today and she did come today to get those done which are to ensure stability per 04/07/2018 note by [employer].

## 2018-07-10 NOTE — Telephone Encounter (Signed)
Patient left a msg on the refill vm requesting a call back at [phone] to discuss the eliquis. She has not been able to have this medication refilled and she is wanting to know if there is a issue on our end in regards to her workers comp of does she need to call to follow up on this? Thanks, MI

## 2018-07-11 NOTE — Telephone Encounter (Addendum)
Received message from [employer] when she gave lab results to pt that pt has no Eliquis and was unable to get it filled. Called pharmacy, they stated rx is not going through. Asked to speak to [facility] who Candance spoke with yesterday, they stated they do not have an employee named Equities trader. They also stated her worker's comp laywer did not contact their pharmacy.   Patient also stated Lovena Le spoke with her too. She provided her Worker's Freight forwarder: JR from [employer]) [phone]. Left message for JR to call back about processing Eliquis through worker's comp.  Also left 2 weeks of Eliquis samples for pt.

## 2018-07-12 ENCOUNTER — Telehealth: Payer: Self-pay | Admitting: Internal Medicine

## 2018-07-12 NOTE — Telephone Encounter (Signed)
Unfortunately I am at a loss, as I dont see a recommendation on this on her chart per cardiology, I would need to know an actual reason to refer, and I have not seen pt since mar 2018  Perhaps pt should consider an OV

## 2018-07-12 NOTE — Telephone Encounter (Signed)
Referral Request - Has patient seen PCP for this complaint? Yes.   *If NO, is insurance requiring patient see PCP for this issue before PCP can refer them? Referral for which specialty: Gastrologist  Preferred provider/office: Dr. Jenny Reichmann Reason for referral: The patient saw her a heart doctor and he also said that she needs to see a specialist.

## 2018-07-12 NOTE — Telephone Encounter (Signed)
Left another message for JR with [employer].

## 2018-07-13 ENCOUNTER — Other Ambulatory Visit: Payer: Self-pay

## 2018-07-13 ENCOUNTER — Encounter: Payer: Self-pay | Admitting: Internal Medicine

## 2018-07-13 ENCOUNTER — Other Ambulatory Visit (INDEPENDENT_AMBULATORY_CARE_PROVIDER_SITE_OTHER): Payer: BC Managed Care – PPO

## 2018-07-13 ENCOUNTER — Ambulatory Visit (INDEPENDENT_AMBULATORY_CARE_PROVIDER_SITE_OTHER): Payer: BC Managed Care – PPO | Admitting: Internal Medicine

## 2018-07-13 ENCOUNTER — Other Ambulatory Visit: Payer: Self-pay | Admitting: Internal Medicine

## 2018-07-13 VITALS — BP 136/82 | HR 96 | Temp 98.6°F | Ht 63.5 in | Wt 343.0 lb

## 2018-07-13 DIAGNOSIS — H68103 Unspecified obstruction of Eustachian tube, bilateral: Secondary | ICD-10-CM | POA: Insufficient documentation

## 2018-07-13 DIAGNOSIS — E611 Iron deficiency: Secondary | ICD-10-CM

## 2018-07-13 DIAGNOSIS — R1310 Dysphagia, unspecified: Secondary | ICD-10-CM

## 2018-07-13 DIAGNOSIS — E559 Vitamin D deficiency, unspecified: Secondary | ICD-10-CM | POA: Diagnosis not present

## 2018-07-13 DIAGNOSIS — G4762 Sleep related leg cramps: Secondary | ICD-10-CM | POA: Diagnosis not present

## 2018-07-13 DIAGNOSIS — R739 Hyperglycemia, unspecified: Secondary | ICD-10-CM | POA: Insufficient documentation

## 2018-07-13 DIAGNOSIS — E538 Deficiency of other specified B group vitamins: Secondary | ICD-10-CM

## 2018-07-13 DIAGNOSIS — Z0001 Encounter for general adult medical examination with abnormal findings: Secondary | ICD-10-CM

## 2018-07-13 DIAGNOSIS — R600 Localized edema: Secondary | ICD-10-CM

## 2018-07-13 DIAGNOSIS — R609 Edema, unspecified: Secondary | ICD-10-CM

## 2018-07-13 DIAGNOSIS — M545 Low back pain: Secondary | ICD-10-CM | POA: Diagnosis not present

## 2018-07-13 DIAGNOSIS — G8929 Other chronic pain: Secondary | ICD-10-CM

## 2018-07-13 LAB — URINALYSIS, ROUTINE W REFLEX MICROSCOPIC
Bilirubin Urine: NEGATIVE
Ketones, ur: NEGATIVE
Nitrite: NEGATIVE
Specific Gravity, Urine: 1.025 (ref 1.000–1.030)
Total Protein, Urine: NEGATIVE
Urine Glucose: NEGATIVE
Urobilinogen, UA: 0.2 (ref 0.0–1.0)
pH: 6 (ref 5.0–8.0)

## 2018-07-13 LAB — LIPID PANEL
Cholesterol: 251 mg/dL — ABNORMAL HIGH (ref 0–200)
HDL: 61.5 mg/dL (ref >39.00)
LDL Cholesterol: 169 mg/dL — ABNORMAL HIGH (ref 0–99)
NonHDL: 189.42
Total CHOL/HDL Ratio: 4
Triglycerides: 101 mg/dL (ref 0.0–149.0)
VLDL: 20.2 mg/dL (ref 0.0–40.0)

## 2018-07-13 LAB — VITAMIN B12: Vitamin B-12: 348 pg/mL (ref 211–911)

## 2018-07-13 LAB — MAGNESIUM: Magnesium: 2.1 mg/dL (ref 1.5–2.5)

## 2018-07-13 LAB — TSH: TSH: 2.15 u[IU]/mL (ref 0.35–4.50)

## 2018-07-13 LAB — IBC PANEL
Iron: 89 ug/dL (ref 42–145)
Saturation Ratios: 23.4 % (ref 20.0–50.0)
Transferrin: 272 mg/dL (ref 212.0–360.0)

## 2018-07-13 LAB — HEMOGLOBIN A1C: Hgb A1c MFr Bld: 6.3 % (ref 4.6–6.5)

## 2018-07-13 LAB — BRAIN NATRIURETIC PEPTIDE: Pro B Natriuretic peptide (BNP): 25 pg/mL (ref 0.0–100.0)

## 2018-07-13 LAB — VITAMIN D 25 HYDROXY (VIT D DEFICIENCY, FRACTURES): VITD: 13.27 ng/mL — ABNORMAL LOW (ref 30.00–100.00)

## 2018-07-13 MED ORDER — ATORVASTATIN CALCIUM 40 MG PO TABS: 40.0000 mg | ORAL_TABLET | Freq: Every day | ORAL | 3 refills | Status: DC

## 2018-07-13 MED ORDER — VITAMIN D (ERGOCALCIFEROL) 1.25 MG (50000 UNIT) PO CAPS: 50000.0000 [IU] | ORAL_CAPSULE | ORAL | 0 refills | Status: DC

## 2018-07-13 MED ORDER — TIZANIDINE HCL 4 MG PO TABS: 4.0000 mg | ORAL_TABLET | Freq: Every day | ORAL | 5 refills | Status: DC

## 2018-07-13 NOTE — Assessment & Plan Note (Signed)
[place] for mucinex otc bid prn,  to f/u any worsening symptoms or concerns

## 2018-07-13 NOTE — Patient Instructions (Addendum)
Please take all new medication as prescribed - the muscle relaxer at bedtime  OK to try Mucinex OTC for the ears clogged  Please continue all other medications as before, and refills have been done if requested.  Please have the pharmacy call with any other refills you may need.  Please continue your efforts at being more active, low cholesterol diet, and weight control.  You are otherwise up to date with prevention measures today.  You will be contacted regarding the referral for: Gastroenterology for the trouble swallowing and pain  Please keep your appointments with your specialists as you may have planned  Please go to the LAB in the Basement (turn left off the elevator) for the tests to be done today  You will be contacted by phone if any changes need to be made immediately.  Otherwise, you will receive a letter about your results with an explanation, but please check with MyChart first.  Please remember to sign up for MyChart if you have not done so, as this will be important to you in the future with finding out test results, communicating by private email, and scheduling acute appointments online when needed.  Please return in 6 months, or sooner if needed

## 2018-07-13 NOTE — Telephone Encounter (Signed)
Appointment has been made for today 7/9.

## 2018-07-13 NOTE — Telephone Encounter (Signed)
Called [facility] again, left message. 

## 2018-07-13 NOTE — Assessment & Plan Note (Signed)
Mild to mod, for muscle relaxer prn, also check Mag, to f/u any worsening symptoms or concerns

## 2018-07-13 NOTE — Assessment & Plan Note (Signed)
Ok for referral to GI, cont same tx  In addition to the time spent performing CPE, I spent an additional 40 minutes face to face,in which greater than 50% of this time was spent in counseling and coordination of care for patient's acute illness as documented, including the differential dx, treatment, further evaluation and other management of dysphagia, bilateral eustachian tube, chronic low back pain, hyperglycemia, nocturnal leg cramps, peripheral edema

## 2018-07-13 NOTE — Assessment & Plan Note (Signed)

## 2018-07-13 NOTE — Assessment & Plan Note (Signed)
Mild to mod, for pain control and muscle relaxer qhs prn,  to f/u any worsening symptoms or concerns

## 2018-07-13 NOTE — Assessment & Plan Note (Signed)
I asked pt to reduce excess free fluids, also check BNP,  to f/u any worsening symptoms or concerns

## 2018-07-13 NOTE — Assessment & Plan Note (Signed)
stable overall by history and exam, recent data reviewed with pt, and pt to continue medical treatment as before,  to f/u any worsening symptoms or concerns  

## 2018-07-13 NOTE — Progress Notes (Signed)
Subjective:    Patient ID: Mia GradCatherine J Costin, female    DOB: November 29, 1955, 63 y.o.   MRN: [id number]  HPI  Here for wellness and f/u;  Overall doing ok;  Pt denies Chest pain, worsening SOB, DOE, wheezing, orthopnea, PND, worsening LE edema, palpitations, dizziness or syncope.  Pt denies neurological change such as new headache, facial or extremity weakness.  Pt denies polydipsia, polyuria, or low sugar symptoms. Pt states overall good compliance with treatment and medications, good tolerability, and has been trying to follow appropriate diet.  Pt denies worsening depressive symptoms, suicidal ideation or panic. No fever, night sweats, wt loss, loss of appetite, or other constitutional symptoms.  Pt states good ability with ADL's, has low fall risk, home safety reviewed and adequate, no other significant changes in hearing or vision, and only occasionally active with exercise. Has gained wt despite the dysphagia Wt Readings from Last 3 Encounters:  07/13/18 (!) 343 lb (155.6 kg)  05/09/17 (!) 325 lb (147.4 kg)  05/04/16 (!) 336 lb 9.6 oz (152.7 kg)  Needs right knee TKR redone as it locks and has a hardware recall, but needs to get to 295.   Also has new 3 wks worsening bilat pedal edema with drinking 4 bottles water per day, just trying to "keep myself flushed out."  Denies orthopena, pnd.   Also has 2-3 mo worsening mild to mod mid esoph area dysphagia to solids, not much with liquids but with occasional vomiting.  Nothing else makes better or worse Also with bilat ear fullness, muffled hearing x 3 wks, mild, keeps the wax out herself, no recent worsening sinus congestion or pain or drainage, nothing else makes it better or worse Pt continues to have recurring LBP without change in severity, bowel or bladder change, fever, wt loss,  worsening LE pain/numbness/weakness, gait change or falls, but since it keeps recurring low midline without sciatica, asks for muscle relaxer Also has several months of  recurring leg cramps mostly at night, without daytime overexertion such as prolonged standing,  Nothing seems to make better or worse Past Medical History:  Diagnosis Date  . Allergic rhinitis, cause unspecified 04/29/2011  . Anemia   . Anemia, unspecified 04/23/2011  . Chronic anticoagulation   . Colon polyps may 2011  . Diverticulosis   . DVT (deep venous thrombosis) (HCC)    right upper extremitiy  . GERD (gastroesophageal reflux disease) 11/10/2011  . Gout 04/22/2010  . H/O: GI bleed   . Hyperlipidemia   . Hypertension   . IBS (irritable bowel syndrome)   . Infected prosthetic knee joint (HCC)   . Migraine 04/29/2011  . Morbid obesity (HCC)   . Nonsmoker   . Pulmonary embolism (HCC) 2010   following right knee replacement  . WPW (Wolff-Parkinson-White syndrome)    Past Surgical History:  Procedure Laterality Date  . CHOLECYSTECTOMY    . TOTAL KNEE ARTHROPLASTY Right 2010   followed by I & D for infection  . TUBAL LIGATION      reports that she has never smoked. She has never used smokeless tobacco. She reports that she does not drink alcohol or use drugs. family history includes Colon cancer in her unknown relative; Coronary artery disease in her unknown relative; Heart Problems in her mother; Hypertension in her brother, sister, sister, and sister; Pancreatic cancer in her sister; Throat cancer in her father. Allergies  Allergen Reactions  . Crestor [Rosuvastatin Calcium] Other (See Comments)    Chest pain  .  Amlodipine Other (See Comments)    Edema,ha  . Piperacillin Sod-Tazobactam So Other (See Comments)    REACTION: "UNSURE"  . Ultram [Tramadol] Nausea Only   Current Outpatient Medications on File Prior to Visit  Medication Sig Dispense Refill  . allopurinol (ZYLOPRIM) 300 MG tablet Take 1 tablet (300 mg total) by mouth daily. 90 tablet 3  . apixaban (ELIQUIS) 2.5 MG TABS tablet Take 1 tablet (2.5 mg total) by mouth 2 (two) times daily. 180 tablet 1  . atorvastatin  (LIPITOR) 40 MG tablet Take 1 tablet (40 mg total) by mouth daily. 90 tablet 3  . butalbital-acetaminophen-caffeine (FIORICET, ESGIC) 50-325-40 MG per tablet TAKE 1 TABLET BY MOUTH TWICE DAILY AS NEEDED FOR HEADACHE 14 tablet 0  . furosemide (LASIX) 40 MG tablet Take 40 mg by mouth daily as needed for fluid.    Marland Kitchen. guanFACINE (TENEX) 1 MG tablet TAKE 1 TABLET(1 MG) BY MOUTH AT BEDTIME 90 tablet 2  . IMITREX 100 MG tablet Take 1 tablet (100 mg total) by mouth every 2 (two) hours as needed for migraine or headache. May repeat in 2 hours if headache persists or recurs. 10 tablet 11  . lactulose (CHRONULAC) 10 GM/15ML solution Take 45 mLs (30 g total) by mouth 3 (three) times daily as needed for mild constipation. 240 mL 5  . lidocaine (LIDODERM) 5 % Place 1 patch onto the skin daily. Remove & Discard patch within 12 hours or as directed by MD 90 patch 1  . losartan (COZAAR) 100 MG tablet Take 1 tablet (100 mg total) by mouth daily. 90 tablet 3  . potassium chloride SA (K-DUR,KLOR-CON) 20 MEQ tablet Take 1 tablet (20 mEq total) by mouth daily. 90 tablet 3   No current facility-administered medications on file prior to visit.    Review of Systems Constitutional: Negative for other unusual diaphoresis, sweats, appetite or weight changes HENT: Negative for other worsening hearing loss, ear pain, facial swelling, mouth sores or neck stiffness.   Eyes: Negative for other worsening pain, redness or other visual disturbance.  Respiratory: Negative for other stridor or swelling Cardiovascular: Negative for other palpitations or other chest pain  Gastrointestinal: Negative for worsening diarrhea or loose stools, blood in stool, distention or other pain Genitourinary: Negative for hematuria, flank pain or other change in urine volume.  Musculoskeletal: Negative for myalgias or other joint swelling.  Skin: Negative for other color change, or other wound or worsening drainage.  Neurological: Negative for other  syncope or numbness. Hematological: Negative for other adenopathy or swelling Psychiatric/Behavioral: Negative for hallucinations, other worsening agitation, SI, self-injury, or new decreased concentration All other system neg per pt    Objective:   Physical Exam BP 136/82   Pulse 96   Temp 98.6 F (37 C) (Oral)   Ht 5' 3.5" (1.613 m)   Wt (!) 343 lb (155.6 kg)   SpO2 100%   BMI 59.81 kg/m  VS noted,  Constitutional: Pt is oriented to person, place, and time. Appears well-developed and well-nourished, in no significant distress and comfortable Head: Normocephalic and atraumatic  Eyes: Conjunctivae and EOM are normal. Pupils are equal, round, and reactive to light Right Ear: External ear normal without discharge Left Ear: External ear normal without discharge Nose: Nose without discharge or deformity Mouth/Throat: Oropharynx is without other ulcerations and moist  Neck: Normal range of motion. Neck supple. No JVD present. No tracheal deviation present or significant neck LA or mass Cardiovascular: Normal rate, regular rhythm, normal heart sounds  and intact distal pulses.   Pulmonary/Chest: WOB normal and breath sounds without rales or wheezing  Abdominal: Soft. Bowel sounds are normal. NT. No HSM  Musculoskeletal: Normal range of motion.  Lymphadenopathy: Has no other cervical adenopathy.  Neurological: Pt is alert and oriented to person, place, and time. Pt has normal reflexes. No cranial nerve deficit. Motor grossly intact, Gait intact Skin: Skin is warm and dry. No rash noted or new ulcerations Psychiatric:  Has normal mood and affect. Behavior is normal without agitation trace to 1+ bilat edema No other exam findings Lab Results  Component Value Date   WBC 9.6 07/10/2018   HGB 14.7 07/10/2018   HCT 43.3 07/10/2018   PLT 232 07/10/2018   GLUCOSE 100 (H) 07/10/2018   CHOL 283 (H) 03/16/2016   TRIG 200.0 (H) 03/16/2016   HDL 39.80 03/16/2016   LDLDIRECT 148.9 12/11/2012    LDLCALC 204 (H) 03/16/2016   ALT 12 03/16/2016   AST 12 03/16/2016   NA 143 07/10/2018   K 4.0 07/10/2018   CL 104 07/10/2018   CREATININE 0.74 07/10/2018   BUN 16 07/10/2018   CO2 26 07/10/2018   TSH 1.08 03/16/2016   INR 2.8 03/28/2018       Assessment & Plan:

## 2018-07-14 ENCOUNTER — Telehealth: Payer: Self-pay

## 2018-07-14 NOTE — Telephone Encounter (Signed)
-----   Message from Biagio Borg, MD sent at 07/13/2018  3:09 PM EDT ----- Left message on MyChart, pt to cont same tx except  The test results show that your current treatment is OK, except the LDL cholesterol is quite high, and the Vitamin D level is low.  Please restart the generic lipitor, and also Please take Vitamin D 50000 units weekly for 12 weeks, then plan to change to OTC Vitamin D3 at 2000 units per day, indefinitely.Redmond Baseman to please inform pt, I will do rx x 2

## 2018-07-14 NOTE — Telephone Encounter (Signed)
Called [facility] again, left message.

## 2018-07-14 NOTE — Telephone Encounter (Signed)
Pt has been informed of results and expressed understanding.  °

## 2018-07-17 NOTE — Telephone Encounter (Signed)
Left message again.

## 2018-07-20 NOTE — Telephone Encounter (Signed)
Mia [name] from [facility] returned call - states he approved Eliquis last week for 6 months. After every 6 months it will need to be reapproved - he stated the pharmacy sends the claim back to [employer] who approves it. This process usually takes a day or so. Pt is aware.

## 2018-07-20 NOTE — Telephone Encounter (Signed)
Left message again. Called pt to advise her I have tried to reach her Worker's Comp office 6x and have left voicemails each time but no one has called back. She states they told her they approved her for 30 more days of Eliquis. She will try to follow up with them again since she needs to stay on Eliquis indefinitely. Advised her to let us know if she needs another few weeks of Eliquis samples in the interim.

## 2018-08-09 ENCOUNTER — Ambulatory Visit: Payer: BC Managed Care – PPO | Admitting: Nurse Practitioner

## 2018-08-21 ENCOUNTER — Telehealth: Payer: Self-pay

## 2018-08-21 NOTE — Telephone Encounter (Signed)
**Note De-Identified Roseanna Koplin Obfuscation** We received a Eliquis PA request from [facility]. I called [facility]  to get the pts ins info I was advised that this PA is not needed as they re-ran the RX while I was on the phone and found that the pts co-pay is 0 as is covered under Workers Comp.

## 2018-08-30 ENCOUNTER — Encounter: Payer: Self-pay | Admitting: *Deleted

## 2018-08-31 ENCOUNTER — Ambulatory Visit: Payer: BC Managed Care – PPO | Admitting: Nurse Practitioner

## 2018-08-31 ENCOUNTER — Encounter: Payer: Self-pay | Admitting: Nurse Practitioner

## 2018-08-31 VITALS — BP 160/90 | HR 92 | Temp 98.7°F | Ht 63.5 in | Wt 350.0 lb

## 2018-08-31 DIAGNOSIS — K219 Gastro-esophageal reflux disease without esophagitis: Secondary | ICD-10-CM | POA: Diagnosis not present

## 2018-08-31 DIAGNOSIS — K5900 Constipation, unspecified: Secondary | ICD-10-CM

## 2018-08-31 MED ORDER — LUBIPROSTONE 24 MCG PO CAPS: 24.0000 ug | ORAL_CAPSULE | Freq: Two times a day (BID) | ORAL | 3 refills | Status: DC

## 2018-08-31 NOTE — Progress Notes (Signed)
ASSESSMENT / PLAN:   32.  63 year old female with altered bowel habits.  It is somewhat difficult to understand her bowel habit pattern.  She describes having diarrhea after everything she eats then having hard stool the next day.  Says the postprandial loose stool has been present since she was a child.  Patient says she hardly eats because of the postprandial loose stool but cannot understand why her weight is up by 25 pounds. -Overall I think she is constipated.  Perhaps the postprandial loose stool is secondary to a hyperactive gastrocolic reflex.  She had no success with Citrucel for 3 months, MiraLAX worked but she required 3 capfuls at a time.  Linzess made her have diarrhea.  Will try Amitiza 24 mcg twice daily  2.  Morbid obesity.  Her weight continues to increase, up from 325 pounds May 2019 to 350 pounds today.  Patient says she eats very little due to the postprandial loose stool and cannot understand why her weight is going up.  -Recent TSH normal at 2.1 -She has mild bilateral lower extremity edema, I could not appreciate any ascites.No abdominal masses appreciated  3. GERD. She gets heartburn at night sometimes. Tums help. Sleeps on two pillows and goes to bed on empty stomach.  -Recommend wedge pillow -If nocturnal GERD symptoms do not improve consider H2 blocker at bedtime.     HPI:    Chief Complaint:   Irregular bowel habits and GERD  Patient is a 63 year old female with a history of hypertension, morbid obesity, DVT, PE, chronic anticoagulation, Wolf Parkinson's White syndrome, and gout . She is known to Dr. Ardis Hughs. She had a screening colonoscopy in 2011, due for another colonoscopy 2021.  He saw her April 2017 for evaluation of chronic constipation.  At the time she was taking Linzess 2 times a week and MiraLAX every 3 days.  He started her daily fiber, MiraLAX every day, and asked her to use Linzess only as needed.  Mia [name] is here with complaints of  loose stool after everything she eats.  She says that this is been going on since she was a child.  Following loose stool after eating patient will have hard stool the next day.  The Citrucel did not work.  Regarding MiraLAX she had to take 3 capfuls mixed in warm prune juice and that still did not work that great.  Lactulose is on her home med list.  Patient says she has not been taking lactulose because no one would refill it.  Patient says she eats vegetables and high-fiber spinach.  She drinks at least 48 ounces of water a day.  Patient says she hardly eats because of the loose stool yet does not understand why her weight keeps going up.  In May of last year she was 325 pounds, she is 350 pounds.  She feels constipated.  She complains of low back pain improved when able to have a normal bowel movement.  In addition to above patient complains of heartburn, mainly at night.  She goes to bed on an empty stomach.  Sleeps on 2 pillows.   Data Reviewed:  Labs 07/10/2018 CBC normal TSH 2.15 BMET -ok Hemoglobin A1c 6.3   Past Medical History:  Diagnosis Date  . Allergic rhinitis, cause unspecified 04/29/2011  . Anemia   . Anemia, unspecified 04/23/2011  . Chronic anticoagulation   . Colon polyps may 2011  .  Diverticulosis   . DVT (deep venous thrombosis) (HCC)    right upper extremitiy  . GERD (gastroesophageal reflux disease) 11/10/2011  . Gout 04/22/2010  . H/O: GI bleed   . Hyperlipidemia   . Hyperplastic colon polyp   . Hypertension   . IBS (irritable bowel syndrome)   . Infected prosthetic knee joint (HCC)   . Migraine 04/29/2011  . Morbid obesity (HCC)   . Nonsmoker   . Pulmonary embolism (HCC) 2010   following right knee replacement  . WPW (Wolff-Parkinson-White syndrome)      Past Surgical History:  Procedure Laterality Date  . CHOLECYSTECTOMY    . TOTAL KNEE ARTHROPLASTY Right 2010   followed by I & D for infection  . TUBAL LIGATION     Family History  Problem Relation  Age of Onset  . Heart Problems Mother   . Throat cancer Father   . Pancreatic cancer Sister   . Hypertension Brother   . Hypertension Sister   . Hypertension Sister   . Hypertension Sister   . Colon cancer Other   . Coronary artery disease Other    Social History   Tobacco Use  . Smoking status: Never Smoker  . Smokeless tobacco: Never Used  Substance Use Topics  . Alcohol use: No  . Drug use: No   Current Outpatient Medications  Medication Sig Dispense Refill  . allopurinol (ZYLOPRIM) 300 MG tablet Take 1 tablet (300 mg total) by mouth daily. 90 tablet 3  . apixaban (ELIQUIS) 2.5 MG TABS tablet Take 1 tablet (2.5 mg total) by mouth 2 (two) times daily. 180 tablet 1  . atorvastatin (LIPITOR) 40 MG tablet Take 1 tablet (40 mg total) by mouth daily. 90 tablet 3  . butalbital-acetaminophen-caffeine (FIORICET, ESGIC) 50-325-40 MG per tablet TAKE 1 TABLET BY MOUTH TWICE DAILY AS NEEDED FOR HEADACHE 14 tablet 0  . furosemide (LASIX) 40 MG tablet Take 40 mg by mouth daily as needed for fluid.    Marland Kitchen guanFACINE (TENEX) 1 MG tablet TAKE 1 TABLET(1 MG) BY MOUTH AT BEDTIME 90 tablet 2  . IMITREX 100 MG tablet Take 1 tablet (100 mg total) by mouth every 2 (two) hours as needed for migraine or headache. May repeat in 2 hours if headache persists or recurs. 10 tablet 11  . lactulose (CHRONULAC) 10 GM/15ML solution Take 45 mLs (30 g total) by mouth 3 (three) times daily as needed for mild constipation. 240 mL 5  . lidocaine (LIDODERM) 5 % Place 1 patch onto the skin daily. Remove & Discard patch within 12 hours or as directed by MD 90 patch 1  . losartan (COZAAR) 100 MG tablet Take 1 tablet (100 mg total) by mouth daily. 90 tablet 3  . potassium chloride SA (K-DUR,KLOR-CON) 20 MEQ tablet Take 1 tablet (20 mEq total) by mouth daily. 90 tablet 3  . tiZANidine (ZANAFLEX) 4 MG tablet Take 1 tablet (4 mg total) by mouth at bedtime. 30 tablet 5  . Vitamin D, Ergocalciferol, (DRISDOL) 1.25 MG (50000 UT)  CAPS capsule Take 1 capsule (50,000 Units total) by mouth every 7 (seven) days. 12 capsule 0   No current facility-administered medications for this visit.    Allergies  Allergen Reactions  . Crestor [Rosuvastatin Calcium] Other (See Comments)    Chest pain  . Amlodipine Other (See Comments)    Edema,ha  . Piperacillin Sod-Tazobactam So Other (See Comments)    REACTION: "UNSURE"  . Ultram [Tramadol] Nausea Only  Review of Systems: All systems reviewed and negative except where noted in HPI.   Physical Exam:    Wt Readings from Last 3 Encounters:  08/31/18 (!) 350 lb (158.8 kg)  07/13/18 (!) 343 lb (155.6 kg)  05/09/17 (!) 325 lb (147.4 kg)    BP (!) 160/90   Pulse 92   Temp 98.7 F (37.1 C)   Ht 5' 3.5" (1.613 m)   Wt (!) 350 lb (158.8 kg)   BMI 61.03 kg/m  Constitutional:  Pleasant female in no acute distress. Psychiatric: Normal mood and affect. Behavior is normal. EENT: Pupils normal.  Conjunctivae are normal. No scleral icterus. Neck supple.  Cardiovascular: Normal rate, regular rhythm. No edema Pulmonary/chest: Effort normal and breath sounds normal. No wheezing, rales or rhonchi. Abdominal: Soft, nondistended, nontender. Bowel sounds active throughout. There are no masses palpable. No hepatomegaly. Neurological: Alert and oriented to person place and time. Skin: Skin is warm and dry. No rashes noted.  Willette ClusterPaula Mykiah Schmuck, NP  08/31/2018, 2:35 PM

## 2018-08-31 NOTE — Patient Instructions (Signed)
We have sent the following medications to your pharmacy for you to pick up at your convenience: Amitiza 24 mcg twice daily  Purchase a wedge pillow and apply under your head to help with GERD symptoms at night.  If you are age 61 or older, your body mass index should be between 23-30. Your Body mass index is 61.03 kg/m. If this is out of the aforementioned range listed, please consider follow up with your Primary Care Provider.  If you are age 86 or younger, your body mass index should be between 19-25. Your Body mass index is 61.03 kg/m. If this is out of the aformentioned range listed, please consider follow up with your Primary Care Provider.

## 2018-09-01 NOTE — Progress Notes (Signed)
I agree wit the above note, plan

## 2018-10-03 ENCOUNTER — Other Ambulatory Visit: Payer: Self-pay | Admitting: Cardiology

## 2018-10-18 ENCOUNTER — Other Ambulatory Visit: Payer: Self-pay | Admitting: Cardiology

## 2018-10-18 NOTE — Telephone Encounter (Addendum)
Prescription refill request for Eliquis received.   Last office visit: [place] (05-09-2017) Scr: 0.74 (07-10-2018) Age: 63 y.o. Weight: 158.8 kg  Pt is on Eliquis 2.5 mg (per Megan Supple's note 04/07/2018). Pt is overdue for an office visit. Called pt and explained that to keep refilling Eliquis pt will still need to see cardiologist anually transferred pt to the main line to make an appointment with cardiologist. Will refill prescription with a 3 month supply to get her to her next appointment.

## 2018-11-06 ENCOUNTER — Other Ambulatory Visit: Payer: Self-pay | Admitting: Cardiology

## 2018-11-16 NOTE — Progress Notes (Signed)
CARDIOLOGY OFFICE NOTE  Date:  11/21/2018    Mia [name] Date of Birth: March 28, 1955 Medical Record #[id number]  PCP:  Corwin LevinsJohn, James W, MD  Cardiologist:  [facility]   Chief Complaint  Patient presents with  . Follow-up    Seen for Dr. Anne FuSkains    History of Present Illness: Mia GradCatherine J [name] is a 63 y.o. female who presents today for a follow up visit. Seen for Dr. Anne FuSkains. Former patient of Dr. Yevonne PaxBrackbill's.   She has a history of DVT, PE on chronic Coumadin, morbid obesity, HTN and HLD. Has had occasional pattern of of WPW seen on prior EKG. Prior knee replacement - complicated by infection. Noted chronic pain syndrome.   Last seen in May of 2019 - felt to be stable - noted she only eats one a meal a day but weight continues to climb.   The patient does not have symptoms concerning for COVID-19 infection (fever, chills, cough, or new shortness of breath).   Comes in today. Here alone. She notes several months of worsening edema. She also endorses chest pain. Says that has been going on for 6 months - she will grab it when it hurts - she has tried to ignore it. She notes lots of stress - has been baby sitting grand kids and has to go up 16 steps - has to stop 3 times and has chest pain along with knee pain. Her swelling is bad. BP is quite high here today. She has been taking multiple doses of Lasix with no real change. She says she is eating only one meal - this has sometimes included hot dogs. She drinks mostly water but has tea/lemonade as well. She is frustrated that she can't lose weight. Has lots of IBS as well that makes it challenging.     Past Medical History:  Diagnosis Date  . Allergic rhinitis, cause unspecified 04/29/2011  . Anemia   . Anemia, unspecified 04/23/2011  . Chronic anticoagulation   . Colon polyps may 2011  . Diverticulosis   . DVT (deep venous thrombosis) (HCC)    right upper extremitiy  . GERD (gastroesophageal reflux disease) 11/10/2011  . Gout  04/22/2010  . H/O: GI bleed   . Hyperlipidemia   . Hyperplastic colon polyp   . Hypertension   . IBS (irritable bowel syndrome)   . Infected prosthetic knee joint (HCC)   . Migraine 04/29/2011  . Morbid obesity (HCC)   . Nonsmoker   . Pulmonary embolism (HCC) 2010   following right knee replacement  . WPW (Wolff-Parkinson-White syndrome)     Past Surgical History:  Procedure Laterality Date  . CHOLECYSTECTOMY    . TOTAL KNEE ARTHROPLASTY Right 2010   followed by I & D for infection  . TUBAL LIGATION       Medications: Current Meds  Medication Sig  . allopurinol (ZYLOPRIM) 300 MG tablet Take 1 tablet (300 mg total) by mouth daily.  Marland Kitchen. atorvastatin (LIPITOR) 40 MG tablet Take 1 tablet (40 mg total) by mouth daily.  . butalbital-acetaminophen-caffeine (FIORICET, ESGIC) 50-325-40 MG per tablet TAKE 1 TABLET BY MOUTH TWICE DAILY AS NEEDED FOR HEADACHE  . ELIQUIS 2.5 MG TABS tablet TAKE 1 TABLET(2.5 MG) BY MOUTH TWICE DAILY  . furosemide (LASIX) 40 MG tablet Take 40 mg by mouth daily as needed for fluid.  Marland Kitchen. guanFACINE (TENEX) 1 MG tablet TAKE 1 TABLET(1 MG) BY MOUTH AT BEDTIME  . lactulose (CHRONULAC) 10 GM/15ML solution Take 45 mLs (  30 g total) by mouth 3 (three) times daily as needed for mild constipation.  Marland Kitchen losartan (COZAAR) 100 MG tablet TAKE 1 TABLET(100 MG) BY MOUTH DAILY  . lubiprostone (AMITIZA) 24 MCG capsule Take 1 capsule (24 mcg total) by mouth 2 (two) times daily with a meal.  . potassium chloride SA (K-DUR,KLOR-CON) 20 MEQ tablet Take 1 tablet (20 mEq total) by mouth daily.  Marland Kitchen tiZANidine (ZANAFLEX) 4 MG tablet Take 1 tablet (4 mg total) by mouth at bedtime.     Allergies: Allergies  Allergen Reactions  . Crestor [Rosuvastatin Calcium] Other (See Comments)    Chest pain  . Amlodipine Other (See Comments)    Edema,ha  . Piperacillin Sod-Tazobactam So Other (See Comments)    REACTION: "UNSURE"  . Ultram [Tramadol] Nausea Only    Social History: The patient   reports that she has never smoked. She has never used smokeless tobacco. She reports that she does not drink alcohol or use drugs.   Family History: The patient's family history includes Colon cancer in an other family member; Coronary artery disease in an other family member; Heart Problems in her mother; Hypertension in her brother, sister, sister, and sister; Pancreatic cancer in her sister; Throat cancer in her father.   Review of Systems: Please see the history of present illness.   All other systems are reviewed and negative.   Physical Exam: VS:  BP (!) 172/118   Pulse 89   Ht 5' 3.5" (1.613 m)   Wt (!) 354 lb 1.9 oz (160.6 kg)   BMI 61.75 kg/m  .  BMI Body mass index is 61.75 kg/m.  Wt Readings from Last 3 Encounters:  11/21/18 (!) 354 lb 1.9 oz (160.6 kg)  08/31/18 (!) 350 lb (158.8 kg)  07/13/18 (!) 343 lb (155.6 kg)    General: Pleasant. Alert and in no acute distress. She is tearful today.   HEENT: Normal.  Neck: Supple, no JVD, carotid bruits, or masses noted.  Cardiac: Regular rate and rhythm. Heart rate a little fast on my exam. No murmurs, rubs, or gallops. 1+ edema.  Respiratory:  Lungs are clear to auscultation bilaterally with normal work of breathing.  GI: Soft and nontender.  MS: No deformity or atrophy. Gait and ROM intact.  Skin: Warm and dry. Color is normal.  Neuro:  Strength and sensation are intact and no gross focal deficits noted.  Psych: Alert, appropriate and with normal affect.   LABORATORY DATA:  EKG:  EKG is ordered today. This demonstrates NSR with WPW noted - reviewed with Dr. Graciela Husbands here in the office today - tracing shows the prolongation of the WPW that terminates.  Lab Results  Component Value Date   WBC 9.6 07/10/2018   HGB 14.7 07/10/2018   HCT 43.3 07/10/2018   PLT 232 07/10/2018   GLUCOSE 100 (H) 07/10/2018   CHOL 251 (H) 07/13/2018   TRIG 101.0 07/13/2018   HDL 61.50 07/13/2018   LDLDIRECT 148.9 12/11/2012   LDLCALC 169 (H)  07/13/2018   ALT 12 03/16/2016   AST 12 03/16/2016   NA 143 07/10/2018   K 4.0 07/10/2018   CL 104 07/10/2018   CREATININE 0.74 07/10/2018   BUN 16 07/10/2018   CO2 26 07/10/2018   TSH 2.15 07/13/2018   INR 2.8 03/28/2018   HGBA1C 6.3 07/13/2018     BNP (last 3 results) No results for input(s): BNP in the last 8760 hours.  ProBNP (last 3 results) Recent Labs  07/13/18 1201  PROBNP 25.0     Other Studies Reviewed Today:   Assessment/Plan:  1. HTN - not controlled - adding Aldactone 12.5 mg QD and Coreg 3.125 mg BID today. Needs salt restriction. Discussed at length.   2. History of PE/DVT - on chronic anticoagulation - she is on Eliquis - checking lab today.   3. Morbid obesity - this is the crux of her issues - discussed at length - my tips given - will refer to dietician as well. Fortunately, she does not have diabetes but sounds like does not know what choices to make or portions to consume.   4. WPW - on intermittent EKGs - noted today on EKG - reviewed with Dr. Caryl Comes - felt to be low risk. Ok to add beta blocker - would not use calcium channel blocker. Adding Coreg today.   5. HLD - on statin    6. COVID-19 Education: The signs and symptoms of COVID-19 were discussed with the patient and how to seek care for testing (follow up with PCP or arrange E-visit).  The importance of social distancing, staying at home, hand hygiene and wearing a mask when out in public were discussed today.  Current medicines are reviewed with the patient today.  The patient does not have concerns regarding medicines other than what has been noted above.  The following changes have been made:  See above.  Labs/ tests ordered today include:    Orders Placed This Encounter  Procedures  . Basic metabolic panel  . CBC no Diff  . Pro b natriuretic peptide  . Basic metabolic panel  . Ambulatory referral to Nutrition and Diabetic Education  . EKG 12/Charge capture     Disposition:    FU with [place] in a few weeks.   Patient is agreeable to this plan and will call if any problems develop in the interim.   SignedTruitt Merle, NP  11/21/2018 2:30 PM  [facility] [address] [place], [place]  [address] Phone: [phone] Fax: [phone]

## 2018-11-21 ENCOUNTER — Encounter: Payer: Self-pay | Admitting: Nurse Practitioner

## 2018-11-21 ENCOUNTER — Ambulatory Visit (INDEPENDENT_AMBULATORY_CARE_PROVIDER_SITE_OTHER): Payer: BC Managed Care – PPO | Admitting: Nurse Practitioner

## 2018-11-21 ENCOUNTER — Other Ambulatory Visit: Payer: Self-pay

## 2018-11-21 VITALS — BP 172/118 | HR 89 | Ht 63.5 in | Wt 354.1 lb

## 2018-11-21 DIAGNOSIS — I456 Pre-excitation syndrome: Secondary | ICD-10-CM | POA: Diagnosis not present

## 2018-11-21 DIAGNOSIS — I10 Essential (primary) hypertension: Secondary | ICD-10-CM | POA: Diagnosis not present

## 2018-11-21 DIAGNOSIS — Z5181 Encounter for therapeutic drug level monitoring: Secondary | ICD-10-CM | POA: Diagnosis not present

## 2018-11-21 DIAGNOSIS — I5032 Chronic diastolic (congestive) heart failure: Secondary | ICD-10-CM

## 2018-11-21 MED ORDER — SPIRONOLACTONE 25 MG PO TABS: 12.5000 mg | ORAL_TABLET | Freq: Every day | ORAL | 3 refills | Status: DC

## 2018-11-21 MED ORDER — CARVEDILOL 3.125 MG PO TABS: 3.1250 mg | ORAL_TABLET | Freq: Two times a day (BID) | ORAL | 3 refills | Status: DC

## 2018-11-21 NOTE — Patient Instructions (Addendum)
After Visit Summary:  We will be checking the following labs today - BMET, CBC, and BNP  BMET in one week   Medication Instructions:    Continue with your current medicines. BUT  I am starting you on Coreg 3.125 mg to take twice a day  I am starting you on Aldactone 25 mg to take just 1/2 a tablet a day - this is for your BP and your swelling  Stay on the one fluid pill a day   If you need a refill on your cardiac medications before your next appointment, please call your pharmacy.     Testing/Procedures To Be Arranged:  N/A  Follow-Up:   See [place] in about 3 weeks  I have referred you to the diabetes and nutrition center - they should call you with a visit about your diet/weight loss.     At [facility], you and your health needs are our priority.  As part of our continuing mission to provide you with exceptional heart care, we have created designated Provider Care Teams.  These Care Teams include your primary Cardiologist (physician) and Advanced Practice Providers (APPs -  Physician Assistants and Nurse Practitioners) who all work together to provide you with the care you need, when you need it.  Special Instructions:  . Stay safe, stay home, wash your hands for at least 20 seconds and wear a mask when out in public.  . It was good to talk with you today.  Here are my tips to lose weight:  1. Drink only water. You do not need milk, juice, tea, soda or diet soda.  2. Do not eat anything "white". This includes white bread, potatoes, rice or mayo  3. Stay away from fried foods and sweets  4. Your portion should be the size of the palm of your hand.  5. Know what your weaknesses are and avoid. .  6. Find an exercise you like and start working towards it every day for 45 to 60 minutes.        Call the [place] office at [phone] if you have any questions, problems or concerns.

## 2018-11-22 LAB — BASIC METABOLIC PANEL
BUN/Creatinine Ratio: 19 (ref 12–28)
BUN: 14 mg/dL (ref 8–27)
CO2: 27 mmol/L (ref 20–29)
Calcium: 10.5 mg/dL — ABNORMAL HIGH (ref 8.7–10.3)
Chloride: 103 mmol/L (ref 96–106)
Creatinine, Ser: 0.74 mg/dL (ref 0.57–1.00)
GFR calc Af Amer: 101 mL/min/{1.73_m2} (ref >59)
GFR calc non Af Amer: 88 mL/min/{1.73_m2} (ref >59)
Glucose: 110 mg/dL — ABNORMAL HIGH (ref 65–99)
Potassium: 4.1 mmol/L (ref 3.5–5.2)
Sodium: 145 mmol/L — ABNORMAL HIGH (ref 134–144)

## 2018-11-22 LAB — CBC
Hematocrit: 46.1 % (ref 34.0–46.6)
Hemoglobin: 15.3 g/dL (ref 11.1–15.9)
MCH: 28.4 pg (ref 26.6–33.0)
MCHC: 33.2 g/dL (ref 31.5–35.7)
MCV: 86 fL (ref 79–97)
Platelets: 266 10*3/uL (ref 150–450)
RBC: 5.39 x10E6/uL — ABNORMAL HIGH (ref 3.77–5.28)
RDW: 14.5 % (ref 11.7–15.4)
WBC: 9.1 10*3/uL (ref 3.4–10.8)

## 2018-11-22 LAB — PRO B NATRIURETIC PEPTIDE: NT-Pro BNP: 150 pg/mL (ref 0–287)

## 2018-11-28 ENCOUNTER — Other Ambulatory Visit: Payer: BC Managed Care – PPO

## 2018-11-28 ENCOUNTER — Other Ambulatory Visit: Payer: Self-pay

## 2018-11-28 DIAGNOSIS — I10 Essential (primary) hypertension: Secondary | ICD-10-CM

## 2018-11-28 DIAGNOSIS — Z5181 Encounter for therapeutic drug level monitoring: Secondary | ICD-10-CM

## 2018-11-28 DIAGNOSIS — I456 Pre-excitation syndrome: Secondary | ICD-10-CM

## 2018-11-28 DIAGNOSIS — I5032 Chronic diastolic (congestive) heart failure: Secondary | ICD-10-CM

## 2018-11-28 LAB — BASIC METABOLIC PANEL
BUN/Creatinine Ratio: 21 (ref 12–28)
BUN: 15 mg/dL (ref 8–27)
CO2: 24 mmol/L (ref 20–29)
Calcium: 9.5 mg/dL (ref 8.7–10.3)
Chloride: 105 mmol/L (ref 96–106)
Creatinine, Ser: 0.73 mg/dL (ref 0.57–1.00)
GFR calc Af Amer: 103 mL/min/{1.73_m2} (ref >59)
GFR calc non Af Amer: 89 mL/min/{1.73_m2} (ref >59)
Glucose: 112 mg/dL — ABNORMAL HIGH (ref 65–99)
Potassium: 3.9 mmol/L (ref 3.5–5.2)
Sodium: 143 mmol/L (ref 134–144)

## 2018-12-04 ENCOUNTER — Telehealth: Payer: Self-pay | Admitting: Cardiology

## 2018-12-04 ENCOUNTER — Telehealth: Payer: Self-pay

## 2018-12-04 NOTE — Telephone Encounter (Signed)
Notes recorded by Frederik Schmidt, RN on 12/04/2018 at 12:25 PM EST  The patient has been notified of the result and verbalized understanding. All questions (if any) were answered.  Frederik Schmidt, RN 12/04/2018 12:25 PM

## 2018-12-04 NOTE — Telephone Encounter (Signed)
S/w pt stated Saturday pt was dizzy had bp taken at [facility] 176/97, pt t/w pharmacist stated should be on brand name meds.  Pt stated Micardis worked the best.  Sunday pt still little dizzy 168/100 out of bed than pm 160/97.  Today pt stated 160/97 with slight dizziness and slight headache pt stated takes tylenol.  Pt uses wrist cuff and stated pharmacist said wrist cuffs are 5 points off.  Pt stated HR elevated due to WPW which pt did not know what this is.  Discussed eating habits, pt does not eat pork only had collard greens and [place] leg.  Pt has been watching salt labels. Will send to Cecille Rubin to advise.

## 2018-12-04 NOTE — Telephone Encounter (Signed)
lvm to discuss Lori's recommendation's.

## 2018-12-04 NOTE — Telephone Encounter (Signed)
-----   Message from Burtis Junes, NP sent at 11/28/2018  9:32 PM EST ----- Ok to report. Labs are stable - would continue on current regimen as outlined at her visit and keep planned follow up.

## 2018-12-04 NOTE — Telephone Encounter (Signed)
I do not think brand name will fix this issue - she will not be able to afford this long term.  Would refer to HTN clinic to see what options are available.

## 2018-12-04 NOTE — Telephone Encounter (Signed)
New message   Patient reporting BP and requesting lab results  Pt c/o BP issue: STAT if pt c/o blurred vision, one-sided weakness or slurred speech  1. What are your last 5 BP readings? 160/97 today, 168/100, 172/118  2. Are you having any other symptoms (ex. Dizziness, headache, blurred vision, passed out)? dizziness 3. What is your BP issue? Elevated BP

## 2018-12-05 ENCOUNTER — Other Ambulatory Visit: Payer: Self-pay | Admitting: Cardiology

## 2018-12-05 NOTE — Telephone Encounter (Signed)
S/w pt is willing to go to HTN clinic.  Pt is scheduled for Dec 8 @ 3:00.  Pt's bp today is 167/107.

## 2018-12-12 ENCOUNTER — Other Ambulatory Visit: Payer: Self-pay

## 2018-12-12 ENCOUNTER — Ambulatory Visit (INDEPENDENT_AMBULATORY_CARE_PROVIDER_SITE_OTHER): Payer: BC Managed Care – PPO | Admitting: Pharmacist

## 2018-12-12 VITALS — BP 152/92 | HR 93

## 2018-12-12 DIAGNOSIS — Z5181 Encounter for therapeutic drug level monitoring: Secondary | ICD-10-CM

## 2018-12-12 DIAGNOSIS — I10 Essential (primary) hypertension: Secondary | ICD-10-CM

## 2018-12-12 DIAGNOSIS — I456 Pre-excitation syndrome: Secondary | ICD-10-CM | POA: Diagnosis not present

## 2018-12-12 DIAGNOSIS — I5032 Chronic diastolic (congestive) heart failure: Secondary | ICD-10-CM | POA: Diagnosis not present

## 2018-12-12 NOTE — Progress Notes (Signed)
Patient ID: Mia [name]                 DOB: 14-Sep-1955                      MRN: [id number]     HPI: Mia [name] is a pleasant 63 y.o. female patient of Dr [name] referred by Truitt Merle, NP to HTN clinic. PMH is significant for DVT/PE on chronic Coumadin, morbid obesity, HTN, and HLD. She was seen by Truitt Merle 2 weeks ago and reported several months of worsening edema and chest pain for 6 months, also under a lot of stress. BP was markedly elevated at 172/118, HR 89 and pt was started on spironolactone 12.5mg  daily and carvedilol 3.125mg  BID. F/u BMET was stable. She presents today for follow up.  Pt presents today in good spirits. She reports tolerating her carvedilol and spironolactone well. Doesn't like taking her Lasix - states it makes her clumsy, takes it about twice a week. She has checked her BP at home using a wrist cuff - BP 138/87 on one wrist and 126/70 on the other. Checks her BP in the AM and PM. Goes to bed at 11pm and wakes up around 5am, takes meds in the morning by 10am. She gets headaches and blurred vision when her BP is high. If this wakes her up at night, she'll take her AM meds earlier. She has been more stressed lately with her grandkids running around her home. Takes Tylenol if she has a headache; avoids NSAIDs. Noticed some swelling in her ankles and feet that started over the weekend. Elevating her legs has not helped.  She is meeting with a dietician this Friday. Has had trouble tolerating dairy recently. Eats 1 meal a day frequently. Has struggled to lose weight - knee pain limits her mobility and she can't swim in the pool due to COVID. She has been reading nutrition labels for a long time - limits her sodium intake to < 2,000mg  daily. Does not drink caffeine.  Current HTN meds: carvedilol 3.125mg  BID, furosemide 40mg  prn fluid, losartan 100mg  daily, spironolactone 12.5mg  daily  Previously tried: [employer] did not cover this  BP goal:  <130/24mmHg  Family History: The patient's family history includes Colon cancer in an other family member; Coronary artery disease in an other family member; Heart Problems in her mother; Hypertension in her brother, sister, sister, and sister; Pancreatic cancer in her sister; Throat cancer in her father.  Social History: The patient  reports that she has never smoked. She has never used smokeless tobacco. She reports that she does not drink alcohol or use drugs.  Diet: Frequently eats only 1 meal a day. Doesn't add salt to food, reads labels and looks for low sodium options. No caffeine. Meeting with a dietician this week  Exercise: Tries to walk, knee pain limits mobility. Has fallen a few times when her knee gives out. States her knee replacement was recalled and she will need another replacement. Swimming was recommended to her but she is unable to do this due to COVID restrictions  Home BP readings: 120-130s/80s using wrist cuff  Wt Readings from Last 3 Encounters:  11/21/18 (!) 354 lb 1.9 oz (160.6 kg)  08/31/18 (!) 350 lb (158.8 kg)  07/13/18 (!) 343 lb (155.6 kg)   BP Readings from Last 3 Encounters:  11/21/18 (!) 172/118  08/31/18 (!) 160/90  07/13/18 136/82   Pulse Readings from Last 3  Encounters:  11/21/18 89  08/31/18 92  07/13/18 96    Renal function: CrCl cannot be calculated (Unknown ideal weight.).  Past Medical History:  Diagnosis Date  . Allergic rhinitis, cause unspecified 04/29/2011  . Anemia   . Anemia, unspecified 04/23/2011  . Chronic anticoagulation   . Colon polyps may 2011  . Diverticulosis   . DVT (deep venous thrombosis) (HCC)    right upper extremitiy  . GERD (gastroesophageal reflux disease) 11/10/2011  . Gout 04/22/2010  . H/O: GI bleed   . Hyperlipidemia   . Hyperplastic colon polyp   . Hypertension   . IBS (irritable bowel syndrome)   . Infected prosthetic knee joint (HCC)   . Migraine 04/29/2011  . Morbid obesity (HCC)   . Nonsmoker   .  Pulmonary embolism (HCC) 2010   following right knee replacement  . WPW (Wolff-Parkinson-White syndrome)     Current Outpatient Medications on File Prior to Visit  Medication Sig Dispense Refill  . allopurinol (ZYLOPRIM) 300 MG tablet Take 1 tablet (300 mg total) by mouth daily. 90 tablet 3  . atorvastatin (LIPITOR) 40 MG tablet Take 1 tablet (40 mg total) by mouth daily. 90 tablet 3  . butalbital-acetaminophen-caffeine (FIORICET, ESGIC) 50-325-40 MG per tablet TAKE 1 TABLET BY MOUTH TWICE DAILY AS NEEDED FOR HEADACHE 14 tablet 0  . carvedilol (COREG) 3.125 MG tablet Take 1 tablet (3.125 mg total) by mouth 2 (two) times daily. 180 tablet 3  . ELIQUIS 2.5 MG TABS tablet TAKE 1 TABLET(2.5 MG) BY MOUTH TWICE DAILY 180 tablet 0  . furosemide (LASIX) 40 MG tablet Take 40 mg by mouth daily as needed for fluid.    Marland Kitchen guanFACINE (TENEX) 1 MG tablet TAKE 1 TABLET(1 MG) BY MOUTH AT BEDTIME 90 tablet 2  . lactulose (CHRONULAC) 10 GM/15ML solution Take 45 mLs (30 g total) by mouth 3 (three) times daily as needed for mild constipation. 240 mL 5  . losartan (COZAAR) 100 MG tablet TAKE 1 TABLET(100 MG) BY MOUTH DAILY 30 tablet 11  . lubiprostone (AMITIZA) 24 MCG capsule Take 1 capsule (24 mcg total) by mouth 2 (two) times daily with a meal. 60 capsule 3  . potassium chloride SA (K-DUR,KLOR-CON) 20 MEQ tablet Take 1 tablet (20 mEq total) by mouth daily. 90 tablet 3  . spironolactone (ALDACTONE) 25 MG tablet Take 0.5 tablets (12.5 mg total) by mouth daily. 90 tablet 3  . tiZANidine (ZANAFLEX) 4 MG tablet Take 1 tablet (4 mg total) by mouth at bedtime. 30 tablet 5   No current facility-administered medications on file prior to visit.     Allergies  Allergen Reactions  . Crestor [Rosuvastatin Calcium] Other (See Comments)    Chest pain  . Amlodipine Other (See Comments)    Edema,ha  . Piperacillin Sod-Tazobactam So Other (See Comments)    REACTION: "UNSURE"  . Ultram [Tramadol] Nausea Only      Assessment/Plan:  1. Hypertension - BP remains elevated above goal <130/5mmHg however notably improved from her visit 2 weeks ago. Will increase spironolactone to 25mg  daily and carvedilol to 6.25mg  BID. Continue losartan 100mg  daily and Lasix prn. Hopefully spironolactone dose increase will help with LEE swelling since pt does not like taking her Lasix. She is meeting with a dietician and has follow up with Dr this week. F/u in HTN clinic in 2 weeks for BP check and BMET.   Jesten Cappuccio E. Warren Lindahl, PharmD, BCACP, CPP [facility] [address]  [place], [place] [address] Phone: [phone]; Fax: [phone] 12/12/2018 3:34 PM

## 2018-12-12 NOTE — Patient Instructions (Addendum)
It was nice to see you today  Your blood pressure goal is less than 130/24mmHg  Increase your spironolactone to 1 tablet (25mg ) once a day  Increase your carvedilol to 6.25mg  twice daily. You can take 2 of your 3.125mg  tablets twice a day until you run out  Continue taking your other medications  Continue to monitor your blood pressure at home   Follow up in clinic for a blood pressure check and lab work in 2 weeks

## 2018-12-14 ENCOUNTER — Encounter: Payer: Self-pay | Admitting: Cardiology

## 2018-12-14 ENCOUNTER — Other Ambulatory Visit: Payer: Self-pay

## 2018-12-14 ENCOUNTER — Ambulatory Visit (INDEPENDENT_AMBULATORY_CARE_PROVIDER_SITE_OTHER): Payer: BC Managed Care – PPO | Admitting: Cardiology

## 2018-12-14 VITALS — BP 116/80 | HR 89 | Ht 63.5 in | Wt 355.6 lb

## 2018-12-14 DIAGNOSIS — I5032 Chronic diastolic (congestive) heart failure: Secondary | ICD-10-CM | POA: Diagnosis not present

## 2018-12-14 DIAGNOSIS — I456 Pre-excitation syndrome: Secondary | ICD-10-CM

## 2018-12-14 DIAGNOSIS — Z86718 Personal history of other venous thrombosis and embolism: Secondary | ICD-10-CM | POA: Diagnosis not present

## 2018-12-14 NOTE — Progress Notes (Signed)
Cardiology Office Note    Date:  12/14/2018   ID:  Mia, [name] 03-17-1955, MRN [id number]  PCP:  Corwin Levins, MD  Cardiologist:   Donato Schultz, MD     History of Present Illness:  MAZEY [name] is a 63 y.o. female former patient of Dr. 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  Prior right knee prosthesis was infected. Prior rotator cuff injury to high risk for surgical correction.  Occasional pattern of WPW seen on EKG. Has severe constipation. Chronic pain.  Denies any chest pain, syncopal, orthopnea, PND. She has had some swelling.  Right knee replaced and hurts. Left knee pain. Back pain. Bowels. Impacted.   She states that she occasionally will try to get a boiled egg down. She has never been a 3 meal a day person she states.  05/09/2017- overall she has been doing fairly well.  She still has that tenderness in the right pretibial region varicose vein that she was mentioning last visit.  She also has a knee recall, sometimes her knee locks up on her.  She has chronic constipation has not had a bowel movement in 7 days.  No fevers chills nausea vomiting syncope.  She states that she only eats one meal a day.  12/14/2018-here for hypertension follow-up.  Today she is having mostly lower extremity edema.  Does not routinely take Lasix because it makes her feel wiped out.  She is taking more spironolactone which should help however I have encouraged her to take her 40 mg of Lasix at least over the next 4 days to help jumpstart a diuresis.  She also told me once again about her IBS type issues.  Past Medical History:  Diagnosis Date  . Allergic rhinitis, cause unspecified 04/29/2011  . Anemia   . Anemia, unspecified 04/23/2011  . Chronic anticoagulation   . Colon polyps may 2011  . Diverticulosis   . DVT (deep venous thrombosis) (HCC)    right upper extremitiy  .  GERD (gastroesophageal reflux disease) 11/10/2011  . Gout 04/22/2010  . H/O: GI bleed   . Hyperlipidemia   . Hyperplastic colon polyp   . Hypertension   . IBS (irritable bowel syndrome)   . Infected prosthetic knee joint (HCC)   . Migraine 04/29/2011  . Morbid obesity (HCC)   . Nonsmoker   . Pulmonary embolism (HCC) 2010   following right knee replacement  . WPW (Wolff-Parkinson-White syndrome)     Past Surgical History:  Procedure Laterality Date  . CHOLECYSTECTOMY    . TOTAL KNEE ARTHROPLASTY Right 2010   followed by I & D for infection  . TUBAL LIGATION      Current Medications: Outpatient Medications Prior to Visit  Medication Sig Dispense Refill  . allopurinol (ZYLOPRIM) 300 MG tablet Take 1 tablet (300 mg total) by mouth daily. 90 tablet 3  . atorvastatin (LIPITOR) 40 MG tablet Take 1 tablet (40 mg total) by mouth daily. 90 tablet 3  . butalbital-acetaminophen-caffeine (FIORICET, ESGIC) 50-325-40 MG per tablet TAKE 1 TABLET BY MOUTH TWICE DAILY AS NEEDED FOR HEADACHE 14 tablet 0  . carvedilol (COREG) 3.125 MG tablet Take 2 tablets (6.25 mg total) by mouth 2 (two) times daily. 180 tablet 3  . ELIQUIS 2.5 MG TABS tablet TAKE 1 TABLET(2.5 MG) BY MOUTH TWICE DAILY 180 tablet 0  . furosemide (LASIX) 40 MG tablet Take 40 mg by mouth daily  as needed for fluid.    Marland Kitchen guanFACINE (TENEX) 1 MG tablet TAKE 1 TABLET(1 MG) BY MOUTH AT BEDTIME 90 tablet 2  . lactulose (CHRONULAC) 10 GM/15ML solution Take 45 mLs (30 g total) by mouth 3 (three) times daily as needed for mild constipation. 240 mL 5  . losartan (COZAAR) 100 MG tablet TAKE 1 TABLET(100 MG) BY MOUTH DAILY 30 tablet 11  . lubiprostone (AMITIZA) 24 MCG capsule Take 1 capsule (24 mcg total) by mouth 2 (two) times daily with a meal. 60 capsule 3  . potassium chloride SA (K-DUR,KLOR-CON) 20 MEQ tablet Take 1 tablet (20 mEq total) by mouth daily. 90 tablet 3  . spironolactone (ALDACTONE) 25 MG tablet Take 1 tablet (25 mg total) by mouth  daily. 90 tablet 3  . tiZANidine (ZANAFLEX) 4 MG tablet Take 1 tablet (4 mg total) by mouth at bedtime. 30 tablet 5   No facility-administered medications prior to visit.     Allergies:   Crestor [rosuvastatin calcium], Amlodipine, Piperacillin sod-tazobactam so, and Ultram [tramadol]   Social History   Socioeconomic History  . Marital status: Single    Spouse name: Not on file  . Number of children: Not on file  . Years of education: Not on file  . Highest education level: Not on file  Occupational History  . Not on file  Tobacco Use  . Smoking status: Never Smoker  . Smokeless tobacco: Never Used  Substance and Sexual Activity  . Alcohol use: No  . Drug use: No  . Sexual activity: Not on file  Other Topics Concern  . Not on file  Social History Narrative  . Not on file   Social Determinants of Health   Financial Resource Strain:   . Difficulty of Paying Living Expenses: Not on file  Food Insecurity:   . Worried About Programme researcher, broadcasting/film/video in the Last Year: Not on file  . Ran Out of Food in the Last Year: Not on file  Transportation Needs:   . Lack of Transportation (Medical): Not on file  . Lack of Transportation (Non-Medical): Not on file  Physical Activity:   . Days of Exercise per Week: Not on file  . Minutes of Exercise per Session: Not on file  Stress:   . Feeling of Stress : Not on file  Social Connections:   . Frequency of Communication with Friends and Family: Not on file  . Frequency of Social Gatherings with Friends and Family: Not on file  . Attends Religious Services: Not on file  . Active Member of Clubs or Organizations: Not on file  . Attends Banker Meetings: Not on file  . Marital Status: Not on file     Family History:  The patient's family history includes Colon cancer in an other family member; Coronary artery disease in an other family member; Heart Problems in her mother; Hypertension in her brother, sister, sister, and sister;  Pancreatic cancer in her sister; Throat cancer in her father.   ROS:   Please see the history of present illness.    Review of Systems  All other systems reviewed and are negative.  unless specified above all other review of systems negative.   PHYSICAL EXAM:   VS:  BP 116/80   Pulse 89   Ht 5' 3.5" (1.613 m)   Wt (!) 355 lb 9.6 oz (161.3 kg)   SpO2 94%   BMI 62.00 kg/m    GEN: Well nourished, well developed, in  no acute distress  HEENT: normal  Neck: no JVD, carotid bruits, or masses Cardiac: RRR; no murmurs, rubs, or gallops,+ edema  Respiratory:  clear to auscultation bilaterally, normal work of breathing GI: soft, nontender, nondistended, + BS, morbidly obese MS: no deformity or atrophy obese, knee scar right noted Skin: warm and dry, no rash Neuro:  Alert and Oriented x 3, Strength and sensation are intact Psych: euthymic mood, full affect  Wt Readings from Last 3 Encounters:  12/14/18 (!) 355 lb 9.6 oz (161.3 kg)  11/21/18 (!) 354 lb 1.9 oz (160.6 kg)  08/31/18 (!) 350 lb (158.8 kg)      Studies/Labs Reviewed:   EKG:  EKG today on 05/09/2017-sinus rhythm 70 no other significant abnormalities.  Personally viewed-prior 05/04/17 shows sinus rhythm 74 with minimal appearing delta wave personally viewed-prior Minimal delta wave appearance to QRS complex.  Recent Labs: 07/13/2018: Magnesium 2.1; TSH 2.15 11/21/2018: Hemoglobin 15.3; NT-Pro BNP 150; Platelets 266 11/28/2018: BUN 15; Creatinine, Ser 0.73; Potassium 3.9; Sodium 143   Lipid Panel    Component Value Date/Time   CHOL 251 (H) 07/13/2018 1201   TRIG 101.0 07/13/2018 1201   HDL 61.50 07/13/2018 1201   CHOLHDL 4 07/13/2018 1201   VLDL 20.2 07/13/2018 1201   LDLCALC 169 (H) 07/13/2018 1201   LDLDIRECT 148.9 12/11/2012 0934    Additional studies/ records that were reviewed today include:  Prior office notes reviewed, blood work reviewed, EKGs reviewed    ASSESSMENT:    1. Chronic diastolic heart failure  (HCC)   2. Wolff-Parkinson-White (WPW) syndrome   3. History of DVT (deep vein thrombosis)   4. Morbid obesity, unspecified obesity type (HCC)      PLAN:  In order of problems listed above:  WPW pattern, mild  - This has been intermittent EKGs, asymptomatic. Avoid beta blockers.  - Dx. Previously, Dr. Patty SermonsBrackbill.  No changes.  Hypertensive heart disease without heart failure  - Overall well controlled.  - no changes.  Overall doing well.  Occasionally she will take furosemide 40 mg as needed.  We will give her potassium supplementation 20 mEq to take with this, sometimes she feels drained after taking it.  She also takes spironolactone which should keep her potassium levels up.  Her blood pressure today looks excellent.  She definitely needs some additional Lasix currently.  Hyperlipidemia  -LDL previously 204.  Continue with atorvastatin 40.  History of DVT/PE  - Continue with Coumadin. She has been following in our office.  -  I think would be wise to switch her to Xarelto since there is been difficulty in regulating her INRs. This would also allow her to be green leafy vegetables which could help with her chronic constipation. In the past, she has tried these medications but they have been unable to pay given cost.  Could revisit this at a later date.  - She has had edema. This is multifactorial, obesity playing a role.  Lasix as needed.  In fact over the next 4 days I encouraged her to take her Lasix.  - She has a varicose vein in her right lower extremity. It is somewhat tender. Compressible.    Severe constipation  - Dr. Christella HartiganJacobs has seen in the past.  - Encourage fiber.  MiraLAX.  She has had this issue since he was 63 years old.  Trying to work on this.  Morbid obesity  - Continue to encourage weight loss.  - Has tried to go to weight loss clinic  in the past.  She states that she eats 1 meal a day.  She is going to see the nutritionist soon.  Weight loss very important for  her.    Medication Adjustments/Labs and Tests Ordered: Current medicines are reviewed at length with the patient today.  Concerns regarding medicines are outlined above.  Medication changes, Labs and Tests ordered today are listed in the Patient Instructions below. Patient Instructions  Medication Instructions:  Please take Furosemide 40 mg once a day for the next 4 days then take as needed. Continue all other medications as listed.  *If you need a refill on your cardiac medications before your next appointment, please call your pharmacy*  Follow-Up: At [facility], you and your health needs are our priority.  As part of our continuing mission to provide you with exceptional heart care, we have created designated Provider Care Teams.  These Care Teams include your primary Cardiologist (physician) and Advanced Practice Providers (APPs -  Physician Assistants and Nurse Practitioners) who all work together to provide you with the care you need, when you need it.  Your next appointment:   1 month(s)  The format for your next appointment:   In Person  Provider:   Hypertension Clinic.  Thank you for choosing [facility]!!        Signed, Candee Furbish, MD  12/14/2018 3:48 PM    [address] [place], [place], [place]  [address] Phone: [phone]; Fax: [phone]

## 2018-12-14 NOTE — Patient Instructions (Signed)
Medication Instructions:  Please take Furosemide 40 mg once a day for the next 4 days then take as needed. Continue all other medications as listed.  *If you need a refill on your cardiac medications before your next appointment, please call your pharmacy*  Follow-Up: At [facility], you and your health needs are our priority.  As part of our continuing mission to provide you with exceptional heart care, we have created designated Provider Care Teams.  These Care Teams include your primary Cardiologist (physician) and Advanced Practice Providers (APPs -  Physician Assistants and Nurse Practitioners) who all work together to provide you with the care you need, when you need it.  Your next appointment:   1 month(s)  The format for your next appointment:   In Person  Provider:   Hypertension Clinic.  Thank you for choosing [facility]!!

## 2018-12-15 ENCOUNTER — Encounter: Payer: Self-pay | Admitting: Registered"

## 2018-12-15 ENCOUNTER — Encounter: Payer: BC Managed Care – PPO | Attending: Nurse Practitioner | Admitting: Registered"

## 2018-12-15 DIAGNOSIS — K582 Mixed irritable bowel syndrome: Secondary | ICD-10-CM | POA: Insufficient documentation

## 2018-12-15 NOTE — Progress Notes (Addendum)
Medical Nutrition Therapy:  Appt start time: 0945 end time:  1030.  Assessment:  Primary concerns today. IBS  Patient states her quality of life is greatly affected by her IBS symptoms. Pt states she avoids going out and when she does go on trips or has appointments she doesn't eat much if anything because she immediately has to use the bathroom.   Patient states one of the meals she has tried is grapenuts cereal, little bit milk - in bathroom immediately. Patient is not familiar with lactose intolerance. Pt states just a few bites of nab crackers sent her to the bathroom.  As is common with many people patient did not remember everything that was consumed yesterday. Beginning of appointment she only remembered eating 1/2 c fruit, just enough food to take medicine yesterday. Later in the appointment she remembered eating chicken salad for dinner.    Pt states she was told not to eat fruit because it has sugar and isn't good for weight loss.  Physical activity: Pt states she used to go to [employer] week and walked 2 hours. Pt reports after several surgeries on her R knee she has not been able to be very mobile. Pt states she has gained 100 lbs d/t limited mobility. Pt states when she over works her knee it swells and she falls, pt denies injuries from falls. Pt states aquatics was recommended to her for therapy. Pt states before COVID she applied for [employer] Silver sneakers will be an option after she turns 54 if she is on a select Medicare plan.  Patient may benefit from low FODMAP elimination diet. Research also shows MBSR (mindfulness based stress reduction) may also be beneficial for IBS.  Preferred Learning Style:   No preference indicated   Learning Readiness:   Ready   MEDICATIONS: reviewed   DIETARY INTAKE:   Avoided foods include; nabs, fish d/t gout flare.    24-hr recall:  B ( AM):  Melon  Snk ( AM):   L ( PM):  Snk ( PM):  D ( PM): chicken salad, wheat  crackers, lettuce, tomato Snk ( PM):  Beverages: water   Usual physical activity: ADLs  Estimated energy needs:  Progress Towards Goal(s):  New goals.   Nutritional Diagnosis:  NB-1.1 Food and nutrition-related knowledge deficit As related to potential role FODMAPs play in IBS.  As evidenced by patient stated lack of knowledge.    Intervention:  Nutrition Education. Discussed basics of how food moves through digestive system and timing of symptoms r/t food intake. Discussed FODMAPs. Discussed modified physical activity.  Teaching Method Utilized:  Visual Auditory  Handouts given during visit include:  FODMAP list  Arm chair handout  Barriers to learning/adherence to lifestyle change: none  Demonstrated degree of understanding via:  Teach Back   Monitoring/Evaluation:  Dietary intake, exercise, IBS symptoms, and body weight in 2 week(s).

## 2018-12-15 NOTE — Patient Instructions (Addendum)
For more movement: Consider calling insurance about the silver sneaker program for gym membership.  Consider doing arm chair exercise videos or use the handout for idease  For IBS symptoms you can try following a modified Low FODMAP diet. Check out the website [email or web address].[email or web address] for recipe ideas and more ideas.  In vegetable soup try leaving out the peas and having rice crackers instead of wheat crackers with it.  For food in the morning to take with medicine consider oatmeal with berries (low FODMAP)  Dairy products: Lactose-free  Sour dough bread for the next two weeks.

## 2018-12-27 ENCOUNTER — Ambulatory Visit (INDEPENDENT_AMBULATORY_CARE_PROVIDER_SITE_OTHER): Payer: Self-pay | Admitting: Pharmacist

## 2018-12-27 ENCOUNTER — Other Ambulatory Visit: Payer: Self-pay

## 2018-12-27 VITALS — BP 132/82 | HR 84

## 2018-12-27 DIAGNOSIS — I456 Pre-excitation syndrome: Secondary | ICD-10-CM

## 2018-12-27 DIAGNOSIS — I5032 Chronic diastolic (congestive) heart failure: Secondary | ICD-10-CM

## 2018-12-27 DIAGNOSIS — Z5181 Encounter for therapeutic drug level monitoring: Secondary | ICD-10-CM

## 2018-12-27 DIAGNOSIS — I10 Essential (primary) hypertension: Secondary | ICD-10-CM

## 2018-12-27 MED ORDER — CARVEDILOL 6.25 MG PO TABS: 6.2500 mg | ORAL_TABLET | Freq: Two times a day (BID) | ORAL | 3 refills | Status: DC

## 2018-12-27 MED ORDER — SPIRONOLACTONE 25 MG PO TABS: 25.0000 mg | ORAL_TABLET | Freq: Every day | ORAL | 3 refills | Status: DC

## 2018-12-27 NOTE — Patient Instructions (Signed)
Continue taking your current medications, your blood pressure is excellent  Take an extra Lasix today - we will check your lab work today and may increase your Lasix dose for the next few days. I will give you a call once your labs come back  Continue meeting with your nutritionist

## 2018-12-27 NOTE — Progress Notes (Signed)
Patient ID: Mia [name]                 DOB: 04/08/1955                      MRN: [id number]     HPI: Mia [name] is a pleasant 63 y.o. female patient of Dr Anne Fu referred by Norma Fredrickson, NP to HTN clinic. PMH is significant for DVT/PE on chronic Coumadin, morbid obesity, HTN, and HLD. She was seen by Norma Fredrickson 2 weeks ago and reported several months of worsening edema and chest pain for 6 months, also under a lot of stress. BP was markedly elevated at 172/118, HR 89 and pt was started on spironolactone 12.5mg  daily and carvedilol 3.125mg  BID. At follow up visit 2 weeks ago, BP improved to 152/92 and spironolactone and carvedilol doses were further increased. Follow up BP 2 days later at appt with Dr Anne Fu improved further to 116/80.  Pt presents today in good spirits. Biggest complaint is leg swelling which has persisted despite taking Lasix 40mg  daily since her last appt with me. The Lasix does make her tired. She has been trying to elevate her legs. Does not add salt to food - is seeing a nutritionist due to IBS like symptoms - slowly eliminating foods to figure out the source of her GI distress. Has struggled to lose weight - knee pain limits her mobility and she cannot swim in the pool due to COVID. Only ate toast and a scrambled egg last week. She has been reading nutrition labels for a long time - limits her sodium intake to < 2,000mg  daily. Does not drink caffeine.  She has been monitoring her BP at home using wrist cuff - 110s-130s, high of 140 systolic.  Checks her BP in the AM and PM. Goes to bed at 11pm and wakes up around 5am, takes meds in the morning by 10am. She gets headaches and blurred vision when her BP is high. If this wakes her up at night, she'll take her AM meds earlier. Takes Tylenol if she has a headache; avoids NSAIDs.  Has been more stressed out and frustrated with leg swelling. She has not been wearing compression stockings regularly, they are hard to put  on.    Current HTN meds: carvedilol 6.25mg  BID, furosemide 40mg  daily, losartan 100mg  daily, spironolactone 25mg  daily  Previously tried: did not cover this  BP goal: <130/3mmHg  Family History: The patient's family history includes Colon cancer in an other family member; Coronary artery disease in an other family member; Heart Problems in her mother; Hypertension in her brother, sister, sister, and sister; Pancreatic cancer in her sister; Throat cancer in her father.  Social History: The patient  reports that she has never smoked. She has never used smokeless tobacco. She reports that she does not drink alcohol or use drugs.  Diet: Frequently eats only 1 meal a day. Doesn't add salt to food, reads labels and looks for low sodium options. No caffeine. Meeting with a dietician this week  Exercise: Tries to walk, knee pain limits mobility. Has fallen a few times when her knee gives out. States her knee replacement was recalled and she will need another replacement. Swimming was recommended to her but she is unable to do this due to COVID restrictions  Home BP readings: 120-130s/80s using wrist cuff  Wt Readings from Last 3 Encounters:  12/14/18 (!) 355 lb 9.6 oz (161.3 kg)  11/21/18 (!) 354 lb 1.9 oz (160.6 kg)  08/31/18 (!) 350 lb (158.8 kg)   BP Readings from Last 3 Encounters:  12/14/18 116/80  12/12/18 (!) 152/92  11/21/18 (!) 172/118   Pulse Readings from Last 3 Encounters:  12/14/18 89  12/12/18 93  11/21/18 89    Renal function: CrCl cannot be calculated (Patient's most recent lab result is older than the maximum 21 days allowed.).  Past Medical History:  Diagnosis Date  . Allergic rhinitis, cause unspecified 04/29/2011  . Anemia   . Anemia, unspecified 04/23/2011  . Chronic anticoagulation   . Colon polyps may 2011  . Diverticulosis   . DVT (deep venous thrombosis) (HCC)    right upper extremitiy  . GERD (gastroesophageal reflux disease)  11/10/2011  . Gout 04/22/2010  . H/O: GI bleed   . Hyperlipidemia   . Hyperplastic colon polyp   . Hypertension   . IBS (irritable bowel syndrome)   . Infected prosthetic knee joint (HCC)   . Migraine 04/29/2011  . Morbid obesity (HCC)   . Nonsmoker   . Pulmonary embolism (HCC) 2010   following right knee replacement  . WPW (Wolff-Parkinson-White syndrome)     Current Outpatient Medications on File Prior to Visit  Medication Sig Dispense Refill  . allopurinol (ZYLOPRIM) 300 MG tablet Take 1 tablet (300 mg total) by mouth daily. 90 tablet 3  . atorvastatin (LIPITOR) 40 MG tablet Take 1 tablet (40 mg total) by mouth daily. 90 tablet 3  . butalbital-acetaminophen-caffeine (FIORICET, ESGIC) 50-325-40 MG per tablet TAKE 1 TABLET BY MOUTH TWICE DAILY AS NEEDED FOR HEADACHE 14 tablet 0  . carvedilol (COREG) 3.125 MG tablet Take 2 tablets (6.25 mg total) by mouth 2 (two) times daily. 180 tablet 3  . ELIQUIS 2.5 MG TABS tablet TAKE 1 TABLET(2.5 MG) BY MOUTH TWICE DAILY 180 tablet 0  . furosemide (LASIX) 40 MG tablet Take 40 mg by mouth daily as needed for fluid.    Marland Kitchen. guanFACINE (TENEX) 1 MG tablet TAKE 1 TABLET(1 MG) BY MOUTH AT BEDTIME 90 tablet 2  . lactulose (CHRONULAC) 10 GM/15ML solution Take 45 mLs (30 g total) by mouth 3 (three) times daily as needed for mild constipation. 240 mL 5  . losartan (COZAAR) 100 MG tablet TAKE 1 TABLET(100 MG) BY MOUTH DAILY 30 tablet 11  . lubiprostone (AMITIZA) 24 MCG capsule Take 1 capsule (24 mcg total) by mouth 2 (two) times daily with a meal. 60 capsule 3  . potassium chloride SA (K-DUR,KLOR-CON) 20 MEQ tablet Take 1 tablet (20 mEq total) by mouth daily. 90 tablet 3  . spironolactone (ALDACTONE) 25 MG tablet Take 1 tablet (25 mg total) by mouth daily. 90 tablet 3  . tiZANidine (ZANAFLEX) 4 MG tablet Take 1 tablet (4 mg total) by mouth at bedtime. 30 tablet 5   No current facility-administered medications on file prior to visit.    Allergies  Allergen  Reactions  . Crestor [Rosuvastatin Calcium] Other (See Comments)    Chest pain  . Amlodipine Other (See Comments)    Edema,ha  . Piperacillin Sod-Tazobactam So Other (See Comments)    REACTION: "UNSURE"  . Ultram [Tramadol] Nausea Only     Assessment/Plan:  1. Hypertension - BP improved and now at goal <130/10580mmHg. Will continue spironolactone 25mg  daily, carvedilol 6.25mg  BID, and losartan 100mg  daily. Checking BMET today since she has been taking Lasix 40mg  daily for the past 2 weeks. If stable, will plan for pt to take Lasix  40mg  BID x 5 days to help with leg swelling. Also encouraged her to use compression stockings and elevate legs. She is continuing to meet with a nutritionist. Advised pt to continue to monitor BP at home and she will call clinic with any concerns, f/u in HTN clinic as needed.  Tenasia Aull E. Junious Ragone, PharmD, BCACP, [place] [address] [place], [place] [address] Phone: [phone]; Fax: [phone] 12/27/2018 7:38 AM

## 2018-12-28 LAB — BASIC METABOLIC PANEL
BUN/Creatinine Ratio: 16 (ref 12–28)
BUN: 13 mg/dL (ref 8–27)
CO2: 23 mmol/L (ref 20–29)
Calcium: 9.8 mg/dL (ref 8.7–10.3)
Chloride: 108 mmol/L — ABNORMAL HIGH (ref 96–106)
Creatinine, Ser: 0.82 mg/dL (ref 0.57–1.00)
GFR calc Af Amer: 89 mL/min/{1.73_m2} (ref >59)
GFR calc non Af Amer: 77 mL/min/{1.73_m2} (ref >59)
Glucose: 124 mg/dL — ABNORMAL HIGH (ref 65–99)
Potassium: 4.3 mmol/L (ref 3.5–5.2)
Sodium: 146 mmol/L — ABNORMAL HIGH (ref 134–144)

## 2019-01-02 ENCOUNTER — Telehealth: Payer: Self-pay | Admitting: Cardiology

## 2019-01-02 DIAGNOSIS — Z79899 Other long term (current) drug therapy: Secondary | ICD-10-CM

## 2019-01-02 NOTE — Telephone Encounter (Signed)
    Please return call with lab results 

## 2019-01-02 NOTE — Telephone Encounter (Signed)
Jerline Pain, MD  12/28/2018 7:23 AM EST    Labs stable from HTN clinic Take Lasix 40mg  BID x 5 days to help with leg swelling (see Megan Supple note) Candee Furbish, MD   Patient stated she has been taking Lasix 40 mg for the last 5 days and today is the first day she is taking it once day. Patient stated her legs are still swollen, but go down when she elevates her legs and wears compression stockings. Patient stated she is eating a low salt diet and just drinking water. Patient stated her BP was 126/86. Will forward to Dr. [name] for further advisement.

## 2019-01-03 MED ORDER — FUROSEMIDE 40 MG PO TABS: 40.0000 mg | ORAL_TABLET | Freq: Two times a day (BID) | ORAL | 3 refills | Status: DC

## 2019-01-03 NOTE — Telephone Encounter (Signed)
Continue with lasix 40 BID for the time being.  Check BMET in 2 weeks.  Candee Furbish, MD

## 2019-01-03 NOTE — Telephone Encounter (Signed)
I spoke to the patient with Dr [name]' recommendations.  She will increase Lasix to 40 mg bid and come in for BMET on 01/15/19.

## 2019-01-03 NOTE — Telephone Encounter (Signed)
Left message for patient to call back  

## 2019-01-04 ENCOUNTER — Ambulatory Visit: Payer: BC Managed Care – PPO | Admitting: Registered"

## 2019-01-08 ENCOUNTER — Encounter: Payer: Self-pay | Admitting: Registered"

## 2019-01-08 ENCOUNTER — Other Ambulatory Visit: Payer: Self-pay

## 2019-01-08 ENCOUNTER — Encounter: Payer: BC Managed Care – PPO | Attending: Internal Medicine | Admitting: Registered"

## 2019-01-08 DIAGNOSIS — K582 Mixed irritable bowel syndrome: Secondary | ICD-10-CM | POA: Insufficient documentation

## 2019-01-08 NOTE — Patient Instructions (Addendum)
For foods lowing in FODMAPS. Consider asking your son to download [employer] FODMAP Diet app (there is a small fee) Continue to avoid onions Continue having the oranges breakfast ProNourish drink can be ordered from [employer] Try a single serving of Fairlife milk Continue having poultry made at home Try rice crackers instead of wheat crackers

## 2019-01-08 NOTE — Progress Notes (Signed)
Medical Nutrition Therapy:  Appt start time: 1400 end time:  1445.  Follow-up Assessment:  Primary concerns today. IBS  Pt states she is very stressed about her weight and knows she needs to eat, but is not usually hungry and when she does it continues to be a problem with diarrhea and/or vomiting. Pt states she cannot eat fried and salty foods anymore. Patient states she loves eggs, but they sit on her stomach and make her sick.  Pt states she tolerates burger king burgers and plant based burger, but no other fast food restaurant. Pt states she does have salt at home and has found that she can taste the high sodium in foods at most restaurants.  Pt states she has not tried Lactaid milk yet because she hasn't been eating cereal.   Pt states she wonders if some of the pain she is having in her stomach is due to stress. Pt states her stress management strategies include talking to her pastor and singing.  Preferred Learning Style:   No preference indicated   Learning Readiness:   Ready   MEDICATIONS: reviewed - diuretic increased d/t swelling   DIETARY INTAKE:   Avoided foods include; nabs, fish d/t gout flare.    24-hr recall: (only one meal) B ( AM):    Snk ( AM):   L ( PM):  Snk ( PM):  D ( PM): plant based whopper, bread, tomato and lettuce Snk ( PM):  Beverages: water   Usual physical activity: ADLs  Estimated energy needs:  Progress Towards Goal(s):  New goals.   Nutritional Diagnosis:  NB-1.1 Food and nutrition-related knowledge deficit As related to potential role FODMAPs play in IBS.  As evidenced by patient stated lack of knowledge.    Intervention:  Nutrition Education. Discussed stress role in its toll on health and weight.   Teaching Method Utilized:  Visual Auditory  Handouts given during visit include:  none  Barriers to learning/adherence to lifestyle change: none  Demonstrated degree of understanding via:  Teach Back   Monitoring/Evaluation:   Dietary intake, exercise, IBS symptoms, and body weight in 3 week(s).

## 2019-01-14 ENCOUNTER — Other Ambulatory Visit: Payer: Self-pay | Admitting: Cardiology

## 2019-01-15 ENCOUNTER — Other Ambulatory Visit: Payer: Self-pay

## 2019-01-15 ENCOUNTER — Other Ambulatory Visit: Payer: BC Managed Care – PPO | Admitting: *Deleted

## 2019-01-15 DIAGNOSIS — Z79899 Other long term (current) drug therapy: Secondary | ICD-10-CM

## 2019-01-15 LAB — BASIC METABOLIC PANEL
BUN/Creatinine Ratio: 13 (ref 12–28)
BUN: 12 mg/dL (ref 8–27)
CO2: 26 mmol/L (ref 20–29)
Calcium: 9.8 mg/dL (ref 8.7–10.3)
Chloride: 107 mmol/L — ABNORMAL HIGH (ref 96–106)
Creatinine, Ser: 0.92 mg/dL (ref 0.57–1.00)
GFR calc Af Amer: 78 mL/min/{1.73_m2} (ref >59)
GFR calc non Af Amer: 67 mL/min/{1.73_m2} (ref >59)
Glucose: 118 mg/dL — ABNORMAL HIGH (ref 65–99)
Potassium: 4.4 mmol/L (ref 3.5–5.2)
Sodium: 145 mmol/L — ABNORMAL HIGH (ref 134–144)

## 2019-01-15 NOTE — Telephone Encounter (Signed)
Prescription refill request for Eliquis received.  Last office visit: skains 12/14/2018 Scr: 12/27/2018, 0.82 Age: 64 y.o. Weight: 161.3 kg   See Aundra Millet Supple's note from 04/07/2018. Pt on Eliquis 2.5 mg BID. Prescription refill sent.

## 2019-01-16 ENCOUNTER — Telehealth: Payer: Self-pay | Admitting: *Deleted

## 2019-01-16 ENCOUNTER — Telehealth: Payer: Self-pay

## 2019-01-16 NOTE — Telephone Encounter (Signed)
**Note De-Identified Ramir Malerba Obfuscation** Following message received from covermymeds concerning the pts Eliquis PA:  Tommy Medal Key: [id number]  Eliquis 2.5MG  tablets has been rejected by insurance.   Non-preferred medications may have a higher patient co-pay than the health insurance plan's preferred medications.   Using available formulary data, we believe that the following medications are more likely to be covered.  Drug Name  PA Requirement  Enoxaparin 40 Mg/0.4 Ml Syrg tier-2, QL NOT Required*  Xarelto 20 Mg Tab tier-1, QL    Will forward message to Dr Anne Fu and his nurse for advisement to the pt.

## 2019-01-16 NOTE — Telephone Encounter (Signed)
OK to use Xarelto 20mg  PO QD. , MD

## 2019-01-16 NOTE — Telephone Encounter (Signed)
Spoke with patient to review recent lab results.  Pt has continued on Furosemide 40 mg BID.  She reports her legs are still swelling quite a bit esp if she is up any length of time.  She states they are so swollen they are tender to the touch, water hurts them when she is showering and they feel like they are going to split.  Pt  Reports to is preparing her meals at home, NAS.  She is not using prepackaged meals or canned goods. She is drinking 2 to 3 16 oz bottles of water a day.  She wear her knee high compression stockings when she can but has to remove them because they make her legs hurt the longer she wears them.  She has an appt 1/13 with Dr Jonny Ruiz.  Aware I will notify Dr Anne Fu of these symptoms.

## 2019-01-17 ENCOUNTER — Ambulatory Visit (INDEPENDENT_AMBULATORY_CARE_PROVIDER_SITE_OTHER): Payer: BC Managed Care – PPO | Admitting: Internal Medicine

## 2019-01-17 ENCOUNTER — Other Ambulatory Visit: Payer: Self-pay

## 2019-01-17 ENCOUNTER — Encounter: Payer: Self-pay | Admitting: Internal Medicine

## 2019-01-17 VITALS — BP 128/84 | HR 81 | Temp 98.1°F | Ht 63.5 in | Wt 356.0 lb

## 2019-01-17 DIAGNOSIS — R609 Edema, unspecified: Secondary | ICD-10-CM

## 2019-01-17 DIAGNOSIS — I10 Essential (primary) hypertension: Secondary | ICD-10-CM | POA: Diagnosis not present

## 2019-01-17 DIAGNOSIS — R739 Hyperglycemia, unspecified: Secondary | ICD-10-CM | POA: Diagnosis not present

## 2019-01-17 MED ORDER — ATORVASTATIN CALCIUM 40 MG PO TABS: 40.0000 mg | ORAL_TABLET | Freq: Every day | ORAL | 3 refills | Status: DC

## 2019-01-17 MED ORDER — FUROSEMIDE 40 MG PO TABS: 60.0000 mg | ORAL_TABLET | Freq: Two times a day (BID) | ORAL | 3 refills | Status: DC

## 2019-01-17 MED ORDER — RIVAROXABAN 20 MG PO TABS: 20.0000 mg | ORAL_TABLET | Freq: Every day | ORAL | 11 refills | Status: DC

## 2019-01-17 NOTE — Assessment & Plan Note (Signed)
stable overall by history and exam, recent data reviewed with pt, and pt to continue medical treatment as before,  to f/u any worsening symptoms or concerns  

## 2019-01-17 NOTE — Assessment & Plan Note (Signed)
Mild to mod worsening, for lasix 60 bid and f/u bmp in 10 days,  to f/u any worsening symptoms or concerns

## 2019-01-17 NOTE — Telephone Encounter (Signed)
Still with volume overload. Increase lasix to 80mg  PO BID Check BMET in 2 weeks and have her come in and see APP on team. , MD

## 2019-01-17 NOTE — Telephone Encounter (Signed)
Ok to increase the lasix to 60 mg twice per day  Please return to the lab on [facility] Floor for repeat kidney testing in 10 days  Please continue all other medications as before, and refills have been done if requested.  Please have the pharmacy call with any other refills you may need.  Please continue your efforts at being more active, low cholesterol diet, and weight control.  Please keep your appointments with your specialists as you may have planned  Please return in 6 months, or sooner if needed  The above information is from Dr Jonny Ruiz today.  Reviewed with Dr Anne Fu who advised pt to follow Dr Raphael Gibney instructions.

## 2019-01-17 NOTE — Telephone Encounter (Signed)
Pt aware to follow Dr Raphael Gibney orders.  She will c/b if no improvement.

## 2019-01-17 NOTE — Progress Notes (Signed)
Subjective:    Patient ID: Mia [name], female    DOB: [birth year], 64 y.o.   MRN: [id number]  HPI  Here to f/u; overall doing ok,  Pt denies chest pain, increasing sob or doe, wheezing, orthopnea, PND, palpitations, dizziness or syncope, but has had 1 mo increased LE edema despite good med compliance and normal BMP.  Pt denies new neurological symptoms such as new headache, or facial or extremity weakness or numbness.  Pt denies polydipsia, polyuria, or low sugar episode.  Pt states overall good compliance with meds, mostly trying to follow appropriate diet, with wt overall stable   Not currently taking vit d otc or statin but willing to restart. Also with chronic right knee pain and plans to f/u ortho but repeat THR being deferred for now due to pandemic Past Medical History:  Diagnosis Date   Allergic rhinitis, cause unspecified 04/29/2011   Anemia    Anemia, unspecified 04/23/2011   Chronic anticoagulation    Colon polyps may 2011   Diverticulosis    DVT (deep venous thrombosis) (HCC)    right upper extremitiy   GERD (gastroesophageal reflux disease) 11/10/2011   Gout 04/22/2010   H/O: GI bleed    Hyperlipidemia    Hyperplastic colon polyp    Hypertension    IBS (irritable bowel syndrome)    Infected prosthetic knee joint (HCC)    Migraine 04/29/2011   Morbid obesity (HCC)    Nonsmoker    Pulmonary embolism (HCC) 2010   following right knee replacement   WPW (Wolff-Parkinson-White syndrome)    Past Surgical History:  Procedure Laterality Date   CHOLECYSTECTOMY     TOTAL KNEE ARTHROPLASTY Right 2010   followed by [facility] for infection   TUBAL LIGATION      reports that she has never smoked. She has never used smokeless tobacco. She reports that she does not drink alcohol or use drugs. family history includes Colon cancer in an other family member; Coronary artery disease in an other family member; Heart Problems in her mother; Hypertension in her  brother, sister, sister, and sister; Pancreatic cancer in her sister; Throat cancer in her father. Allergies  Allergen Reactions   Crestor [Rosuvastatin Calcium] Other (See Comments)    Chest pain   Amlodipine Other (See Comments)    Edema,ha   Piperacillin Sod-Tazobactam So Other (See Comments)    REACTION: "UNSURE"   Ultram [Tramadol] Nausea Only   Current Outpatient Medications on File Prior to Visit  Medication Sig Dispense Refill   allopurinol (ZYLOPRIM) 300 MG tablet Take 1 tablet (300 mg total) by mouth daily. 90 tablet 3   butalbital-acetaminophen-caffeine (FIORICET, ESGIC) 50-325-40 MG per tablet TAKE 1 TABLET BY MOUTH TWICE DAILY AS NEEDED FOR HEADACHE 14 tablet 0   carvedilol (COREG) 6.25 MG tablet Take 1 tablet (6.25 mg total) by mouth 2 (two) times daily. 180 tablet 3   guanFACINE (TENEX) 1 MG tablet TAKE 1 TABLET(1 MG) BY MOUTH AT BEDTIME 90 tablet 2   lactulose (CHRONULAC) 10 GM/15ML solution Take 45 mLs (30 g total) by mouth 3 (three) times daily as needed for mild constipation. 240 mL 5   losartan (COZAAR) 100 MG tablet TAKE 1 TABLET(100 MG) BY MOUTH DAILY 30 tablet 11   lubiprostone (AMITIZA) 24 MCG capsule Take 1 capsule (24 mcg total) by mouth 2 (two) times daily with a meal. 60 capsule 3   potassium chloride SA (K-DUR,KLOR-CON) 20 MEQ tablet Take 1 tablet (20 mEq total)  by mouth daily. 90 tablet 3   spironolactone (ALDACTONE) 25 MG tablet Take 1 tablet (25 mg total) by mouth daily. 90 tablet 3   tiZANidine (ZANAFLEX) 4 MG tablet Take 1 tablet (4 mg total) by mouth at bedtime. 30 tablet 5   rivaroxaban (XARELTO) 20 MG TABS tablet Take 1 tablet (20 mg total) by mouth daily with supper. 30 tablet 11   No current facility-administered medications on file prior to visit.   Review of Systems  Constitutional: Negative for other unusual diaphoresis or sweats HENT: Negative for ear discharge or swelling Eyes: Negative for other worsening visual  disturbances Respiratory: Negative for stridor or other swelling  Gastrointestinal: Negative for worsening distension or other blood Genitourinary: Negative for retention or other urinary change Musculoskeletal: Negative for other MSK pain or swelling Skin: Negative for color change or other new lesions Neurological: Negative for worsening tremors and other numbness  Psychiatric/Behavioral: Negative for worsening agitation or other fatigue All otherwise neg per pt     Objective:   Physical Exam BP 128/84    Pulse 81    Temp 98.1 F (36.7 C) (Oral)    Ht 5' 3.5" (1.613 m)    Wt (!) 356 lb (161.5 kg)    SpO2 96%    BMI 62.07 kg/m  VS noted,  Constitutional: Pt appears in NAD HENT: Head: NCAT.  Right Ear: External ear normal.  Left Ear: External ear normal.  Eyes: . Pupils are equal, round, and reactive to light. Conjunctivae and EOM are normal Nose: without d/c or deformity Neck: Neck supple. Gross normal ROM Cardiovascular: Normal rate and regular rhythm.   Pulmonary/Chest: Effort normal and breath sounds without rales or wheezing.  Abd:  Soft, NT, ND, + BS, no organomegaly Neurological: Pt is alert. At baseline orientation, motor grossly intact Skin: Skin is warm. No rashes, other new lesions, 1-2+ bilat LE edema Psychiatric: Pt behavior is normal without agitation  All otherwise neg per pt Lab Results  Component Value Date   WBC 9.1 11/21/2018   HGB 15.3 11/21/2018   HCT 46.1 11/21/2018   PLT 266 11/21/2018   GLUCOSE 118 (H) 01/15/2019   CHOL 251 (H) 07/13/2018   TRIG 101.0 07/13/2018   HDL 61.50 07/13/2018   LDLDIRECT 148.9 12/11/2012   LDLCALC 169 (H) 07/13/2018   ALT 12 03/16/2016   AST 12 03/16/2016   NA 145 (H) 01/15/2019   K 4.4 01/15/2019   CL 107 (H) 01/15/2019   CREATININE 0.92 01/15/2019   BUN 12 01/15/2019   CO2 26 01/15/2019   TSH 2.15 07/13/2018   INR 2.8 03/28/2018   HGBA1C 6.3 07/13/2018          Assessment & Plan:

## 2019-01-17 NOTE — Patient Instructions (Signed)
Ok to increase the lasix to 60 mg twice per day  Please return to the lab on [facility] Floor for repeat kidney testing in 10 days  Please continue all other medications as before, and refills have been done if requested.  Please have the pharmacy call with any other refills you may need.  Please continue your efforts at being more active, low cholesterol diet, and weight control.  Please keep your appointments with your specialists as you may have planned  Please return in 6 months, or sooner if needed

## 2019-01-17 NOTE — Telephone Encounter (Signed)
Pt aware Rx for Xarelto 20 mg once daily by mouth has been sent into her pharmacy.

## 2019-01-19 ENCOUNTER — Telehealth: Payer: Self-pay

## 2019-01-19 NOTE — Telephone Encounter (Signed)
**Note De-Identified Roya Gieselman Obfuscation** We received a fax from [facility] stating that a PA is required for the pts Xarelto.  I called the pts plan [employer] (Not [employer]) at [phone] and s/w Maggie who advised me that this PA is not needed and that [facility] has already processed the claim and it was covered.

## 2019-01-29 ENCOUNTER — Encounter: Payer: Self-pay | Admitting: Internal Medicine

## 2019-01-29 ENCOUNTER — Other Ambulatory Visit (INDEPENDENT_AMBULATORY_CARE_PROVIDER_SITE_OTHER): Payer: BC Managed Care – PPO

## 2019-01-29 ENCOUNTER — Other Ambulatory Visit: Payer: Self-pay

## 2019-01-29 DIAGNOSIS — R609 Edema, unspecified: Secondary | ICD-10-CM | POA: Diagnosis not present

## 2019-01-29 LAB — BASIC METABOLIC PANEL
BUN: 18 mg/dL (ref 6–23)
CO2: 31 mEq/L (ref 19–32)
Calcium: 9.8 mg/dL (ref 8.4–10.5)
Chloride: 105 mEq/L (ref 96–112)
Creatinine, Ser: 0.9 mg/dL (ref 0.40–1.20)
GFR: 76.93 mL/min (ref >60.00)
Glucose, Bld: 110 mg/dL — ABNORMAL HIGH (ref 70–99)
Potassium: 3.9 mEq/L (ref 3.5–5.1)
Sodium: 141 mEq/L (ref 135–145)

## 2019-01-30 ENCOUNTER — Ambulatory Visit: Payer: BC Managed Care – PPO | Admitting: Registered"

## 2019-02-05 ENCOUNTER — Other Ambulatory Visit: Payer: Self-pay | Admitting: Internal Medicine

## 2019-02-05 NOTE — Telephone Encounter (Signed)
    Patient request refill on furosemide (LASIX) 40 MG tablet Pharmacy:[id number] - [place], [place] - [address] AT [facility] OF [address] & [address]

## 2019-02-05 NOTE — Telephone Encounter (Signed)
Requested medication was sent to [facility] pharmacy on 01-17-19 with 3 refills.   Called patient and left voicemail message

## 2019-02-08 NOTE — Telephone Encounter (Signed)
Patient calling and states that [facility] is telling her that they did not receive the lasix prescription. On med list it says "no print". Patient would like this medication resent because she is out of this medication. Please advise.

## 2019-02-12 MED ORDER — FUROSEMIDE 40 MG PO TABS: 60.0000 mg | ORAL_TABLET | Freq: Two times a day (BID) | ORAL | 3 refills | Status: DC

## 2019-02-12 NOTE — Telephone Encounter (Signed)
Resent rx to [facility]mb 

## 2019-02-12 NOTE — Addendum Note (Signed)
Addended by: Deatra James on: 02/12/2019 10:01 AM   Modules accepted: Orders

## 2019-02-14 ENCOUNTER — Other Ambulatory Visit: Payer: Self-pay

## 2019-02-14 ENCOUNTER — Encounter: Payer: Self-pay | Admitting: Registered"

## 2019-02-14 ENCOUNTER — Encounter: Payer: BC Managed Care – PPO | Attending: Internal Medicine | Admitting: Registered"

## 2019-02-14 DIAGNOSIS — K582 Mixed irritable bowel syndrome: Secondary | ICD-10-CM | POA: Insufficient documentation

## 2019-02-14 NOTE — Progress Notes (Signed)
Medical Nutrition Therapy:  Appt start time: 1145 end time:  1215.  Follow-up Assessment:  Primary concerns today. IBS  Diarrhea: Pt states symptoms have improved with dietary changes discussed last visit including avoiding some high FODMAP foods including onions. Pt states instead of daily stomach pain, now just occassional pain. Pt reports she still avoids eating before going out of the house due to fear diarrhea. Pt states she enjoys salad, but causes issues including heartburn.  Constipation: Infrequent bowel movements continue, typical once every 5 days. Patient reports accompanying back pain and inability to eat.  Swelling/leg pain: Pt reports swelling has reduced significantly and moving better. Pt states she has been able to reduce lasix.  Pt states she can eat hardboiled eggs occasionally now.  Nutrition Relevant PMH: cholecystectomy, IBS, Diverticulosis, GERD, Dysphagia   Preferred Learning Style:   No preference indicated   Learning Readiness:   Ready  MEDICATIONS: reviewed - Pt states 2 tylenol each night helps relieve leg pain enough she can sleep.   DIETARY INTAKE:   Avoided foods include; nabs, fish d/t gout flare.    24-hr recall: (2 days ago when she wasn't avoiding diarrhea) B ( AM): Slimfast, 2 tangerines Snk ( AM):   L ( PM):  Snk ( PM):  D ( PM): Filet-o-fish, no tartar sauce, prunes (did not produce BM) Snk ( PM):  Beverages: water   Usual physical activity: ADLs  Estimated energy needs:  Progress Towards Goal(s):  New goals.   Nutritional Diagnosis:  NB-1.1 Food and nutrition-related knowledge deficit As related to potential role FODMAPs play in IBS.  As evidenced by patient stated lack of knowledge.    Intervention:  Nutrition Education. Discussed foods to help relieve constipation.  Teaching Method Utilized:  Visual Auditory  Handouts given during visit include:  none  Barriers to learning/adherence to lifestyle change:  none  Demonstrated degree of understanding via:  Teach Back   Monitoring/Evaluation:  Dietary intake, exercise, IBS symptoms, and body weight in 3 week(s).

## 2019-02-14 NOTE — Patient Instructions (Signed)
Instead of salads or raw veggies, try cooking your vegetables. Continue to eat prunes, consider having with oatmeal tonight to see if helps with morning bowel movement. May want to ask your pharmacist about the tea that has senna in it. One brand is called Smooth Move. Continue working on getting more activity. Call your insurance to see if they can help pay for [employer]

## 2019-02-26 ENCOUNTER — Encounter: Payer: Self-pay | Admitting: Registered"

## 2019-02-26 ENCOUNTER — Other Ambulatory Visit: Payer: Self-pay

## 2019-02-26 ENCOUNTER — Encounter: Payer: BC Managed Care – PPO | Admitting: Registered"

## 2019-02-26 DIAGNOSIS — K582 Mixed irritable bowel syndrome: Secondary | ICD-10-CM

## 2019-02-26 NOTE — Progress Notes (Signed)
Medical Nutrition Therapy:  Appt start time: 1450 end time:  1520.  Follow-up Assessment:  Primary concerns today. IBS  Patient states recently had some fresh spinach which helped promote a bowel movement for 2 days,first day was "hard as a brick" second day normal bowel movement.  Pt states stomach pain continues to be improved, the last time happened after eating an egg.  Patient states she really has to lose weight to help her knee pain and also wants to set a good example for her grandchildren. Patient states she wants to lose 100 lbs. Pt states her MD said she is not a candidate for gastric bypass surgery due to how easily she gets infections.   Patient reports some days continues to skip meals, states today all she has had is watered down tea left over from dinner last night.   A few sample days this week: Wednesday: [place] breast, cabbage [place] & vegetable crackers.  Thursday: Slimfast for breakfast cuties for snacks chicken breast, broccoli, ~1/2 c mac & cheese  Nutrition Relevant PMH: cholecystectomy, IBS, Diverticulosis, GERD, Dysphagia   Preferred Learning Style:   No preference indicated   Learning Readiness:   Ready  MEDICATIONS: reviewed - Pt states 2 tylenol each night helps relieve leg pain enough she can sleep.   DIETARY INTAKE: Avoided foods include; onions (FODMAP), nabs, fish d/t gout flare.   24-hr recall: B ( AM):  Snk ( AM):   L ( PM):  Snk ( PM):  D ( PM): Filet-o-fish, no tartar sauce Snk ( PM):  Beverages: water  Usual physical activity: ADLs - cleaning, sweeping, cleaning out tub.  Estimated energy needs: 1800 kcal  Progress Towards Goal(s):  New goals.   Nutritional Diagnosis:  NB-1.1 Food and nutrition-related knowledge deficit As related to potential role FODMAPs play in IBS.  As evidenced by patient stated lack of knowledge.    Intervention:  Nutrition Education. Topics covered today: * fruits that can irritate IBS (FODMAPs) *  protein options * cooked verses raw vegetables   Teaching Method Utilized:  Visual Auditory  Handouts given during visit include:  none  Barriers to learning/adherence to lifestyle change: none  Demonstrated degree of understanding via:  Teach Back   Monitoring/Evaluation:  Dietary intake, exercise, IBS symptoms, and body weight in 4 week(s).

## 2019-02-26 NOTE — Patient Instructions (Addendum)
Aim to eat 2 or three meals per day instead of nibbling Have a plan for when you will be eating  Most fruits can irritate your stomach. Low FODMAP fruit that seem to be okay for you are bananas, berries, small portions of cantaloupe, small mandarin oranges  Having [place] or chicken are good sources of protein. You can ask your deli for low-sodium deli meat.  Continue having greens and fresh and frozen vegetables.

## 2019-03-02 ENCOUNTER — Other Ambulatory Visit: Payer: Self-pay

## 2019-03-02 DIAGNOSIS — I5032 Chronic diastolic (congestive) heart failure: Secondary | ICD-10-CM

## 2019-03-02 DIAGNOSIS — Z5181 Encounter for therapeutic drug level monitoring: Secondary | ICD-10-CM

## 2019-03-02 DIAGNOSIS — I456 Pre-excitation syndrome: Secondary | ICD-10-CM

## 2019-03-02 DIAGNOSIS — I10 Essential (primary) hypertension: Secondary | ICD-10-CM

## 2019-03-02 MED ORDER — CARVEDILOL 6.25 MG PO TABS: 6.2500 mg | ORAL_TABLET | Freq: Two times a day (BID) | ORAL | 3 refills | Status: DC

## 2019-03-05 ENCOUNTER — Telehealth: Payer: Self-pay

## 2019-03-05 ENCOUNTER — Other Ambulatory Visit: Payer: Self-pay

## 2019-03-05 MED ORDER — ALLOPURINOL 300 MG PO TABS: 300.0000 mg | ORAL_TABLET | Freq: Every day | ORAL | 1 refills | Status: DC

## 2019-03-05 NOTE — Telephone Encounter (Signed)
Sent medication Allopurinol 300mg  to on [address]

## 2019-03-05 NOTE — Telephone Encounter (Signed)
1.Medication Requested: Patient does not know the name of her GOUT medication    2. Pharmacy (Name, Street, [place]):[facility] on [address]   3. On Med List: unsure of the medication name  4. Last Visit with PCP: 1.13.2021   5. Next visit date with PCP: 7.13.2021

## 2019-03-26 ENCOUNTER — Encounter: Payer: BC Managed Care – PPO | Attending: Internal Medicine | Admitting: Registered"

## 2019-03-26 DIAGNOSIS — K582 Mixed irritable bowel syndrome: Secondary | ICD-10-CM | POA: Insufficient documentation

## 2019-04-27 ENCOUNTER — Other Ambulatory Visit: Payer: Self-pay

## 2019-04-27 ENCOUNTER — Ambulatory Visit (HOSPITAL_COMMUNITY)
Admission: EM | Admit: 2019-04-27 | Discharge: 2019-04-27 | Disposition: A | Discharge status: Home / Self Care | Payer: BC Managed Care – PPO | Attending: Physician Assistant | Admitting: Physician Assistant

## 2019-04-27 DIAGNOSIS — H6121 Impacted cerumen, right ear: Secondary | ICD-10-CM

## 2019-04-27 DIAGNOSIS — M7662 Achilles tendinitis, left leg: Secondary | ICD-10-CM | POA: Diagnosis not present

## 2019-04-27 MED ORDER — DICLOFENAC SODIUM 1 % EX GEL: 4.0000 g | Freq: Four times a day (QID) | CUTANEOUS | 0 refills | Status: DC

## 2019-04-27 MED ORDER — CARBAMIDE PEROXIDE 6.5 % OT SOLN: 5.0000 [drp] | Freq: Two times a day (BID) | OTIC | 0 refills | Status: DC

## 2019-04-27 MED ORDER — PREDNISONE 10 MG PO TABS: 30.0000 mg | ORAL_TABLET | Freq: Every day | ORAL | 0 refills | Status: AC

## 2019-04-27 MED ORDER — HYDROCORTISONE-ACETIC ACID 1-2 % OT SOLN: 3.0000 [drp] | Freq: Two times a day (BID) | OTIC | 0 refills | Status: DC

## 2019-04-27 NOTE — ED Triage Notes (Signed)
Pt c/o L ankle pain x 2 days, denies injury, states she cannot bear weight.   Also c/o right ear pain that wakes her up at night.

## 2019-04-27 NOTE — ED Provider Notes (Signed)
[facility]    CSN: [id number] Arrival date & time: 04/27/19  1119      History   Chief Complaint Chief Complaint  Patient presents with  . Ankle Pain  . Otalgia    HPI Mia [name] is a 64 y.o. female.   Patient presents for evaluation of left foot pain and right ear pain.  She reports yesterday morning she woke with fairly severe left heel pain.  She reports the pain is 10/10.  She reports it hurts when when she walks.  She reports if anything touches the back of her heel it hurts  significantly.  She reports that if she is resting and nothing is touching the heel, it does not bother her that much.  She has baseline swelling in both ankles but has noted a small bump on the back of her heel on the left side.  She reports this pain does every now and then shoot up just above her ankle in the back of her leg.  She denies injury or trauma to the heel.  Denies feeling or hearing a pop.  Denies significant exercise leading up to the event.  She has a history of gout in her big toe but never in her ankle.  Denies fever chills.  Denies significant redness.  Denies pain anywhere else in the ankle with exception of the heel.   She also has been reporting on and off right-sided ear pain mainly at night.  She reports feeling this bit of throbbing.  Denies pain with movement of the ear.  Does report feeling the ears are clogged with some hearing loss.  Patient has ongoing evaluation of her right knee which was replaced and has continued to give her a lot of issues and pain.     Past Medical History:  Diagnosis Date  . Allergic rhinitis, cause unspecified 04/29/2011  . Anemia   . Anemia, unspecified 04/23/2011  . Chronic anticoagulation   . Colon polyps may 2011  . Diverticulosis   . DVT (deep venous thrombosis) (HCC)    right upper extremitiy  . GERD (gastroesophageal reflux disease) 11/10/2011  . Gout 04/22/2010  . H/O: GI bleed   . Hyperlipidemia   . Hyperplastic colon  polyp   . Hypertension   . IBS (irritable bowel syndrome)   . Infected prosthetic knee joint (HCC)   . Migraine 04/29/2011  . Morbid obesity (HCC)   . Nonsmoker   . Pulmonary embolism (HCC) 2010   following right knee replacement  . WPW (Wolff-Parkinson-White syndrome)     Patient Active Problem List   Diagnosis Date Noted  . Nocturnal leg cramps 07/13/2018  . Chronic low back pain 07/13/2018  . Hyperglycemia 07/13/2018  . Peripheral edema 07/13/2018  . Blocked Eustachian tube, bilateral 07/13/2018  . Dysphagia 07/13/2018  . Abdominal pain 03/16/2016  . Multiple bruises 05/22/2014  . Left foot pain 05/22/2014  . Right calf pain 01/25/2014  . Right ear pain 01/25/2014  . Acute gouty arthritis 01/24/2014  . Migraine 06/19/2013  . Lower back pain 12/15/2012  . UTI (urinary tract infection) 09/15/2012  . Right lumbar radiculopathy 02/15/2012  . GERD (gastroesophageal reflux disease) 11/10/2011  . Allergic rhinitis 04/29/2011  . Migraine 04/29/2011  . Morbid obesity (HCC) 04/29/2011  . Constipation 04/29/2011  . Encounter for well adult exam with abnormal findings 04/23/2011  . Irritable bowel syndrome with both constipation and diarrhea 04/23/2011  . Anemia, unspecified 04/23/2011  . Diverticulosis   . Colon  polyps   . Wolff-Parkinson-White (WPW) syndrome 04/01/2011  . Dyslipidemia 01/01/2011  . Edema of foot 10/26/2010  . Infection of prosthetic knee joint (HCC) 10/26/2010  . Fall 09/30/2010  . HTN (hypertension) 04/24/2010  . Other pulmonary embolism and infarction 04/24/2010  . Chronic anticoagulation   . Hair loss 04/22/2010  . Gout 04/22/2010  . Leg swelling 04/22/2010  . DVT (deep venous thrombosis) (HCC) 03/27/2010  . METHICILLIN RESISTANT STAPHYLOCOCCUS AUREUS INFECTION 02/16/2010  . KLEBSIELLA PNEUMONIAE INFECTION 02/16/2010  . Bacteremia 02/16/2010  . PROSTHETIC JOINT COMPLICATION 02/16/2010    Past Surgical History:  Procedure Laterality Date  .  CHOLECYSTECTOMY    . TOTAL KNEE ARTHROPLASTY Right 2010   followed by I & D for infection  . TUBAL LIGATION      OB History   No obstetric history on file.      Home Medications    Prior to Admission medications   Medication Sig Start Date End Date Taking? Authorizing Provider  Acetaminophen (TYLENOL PO) Take by mouth.    [provider]  allopurinol (ZYLOPRIM) 300 MG tablet Take 1 tablet (300 mg total) by mouth daily. 03/05/19   Corwin Levins, MD  atorvastatin (LIPITOR) 40 MG tablet Take 1 tablet (40 mg total) by mouth daily. 01/17/19   Corwin Levins, MD  butalbital-acetaminophen-caffeine (FIORICET, ESGIC) [phone] MG per tablet TAKE 1 TABLET BY MOUTH TWICE DAILY AS NEEDED FOR HEADACHE Patient not taking: Reported on 02/14/2019 06/08/13   Corwin Levins, MD  carbamide peroxide (DEBROX) 6.5 % OTIC solution Place 5 drops into the right ear 2 (two) times daily. 04/27/19   Birdie Fetty, Veryl Speak, PA-C  carvedilol (COREG) 6.25 MG tablet Take 1 tablet (6.25 mg total) by mouth 2 (two) times daily. 03/02/19   Jake Bathe, MD  diclofenac Sodium (VOLTAREN) 1 % GEL Apply 4 g topically 4 (four) times daily. 04/27/19   Yaris Ferrell, Veryl Speak, PA-C  furosemide (LASIX) 40 MG tablet Take 1.5 tablets (60 mg total) by mouth 2 (two) times daily. 02/12/19 05/13/19  Corwin Levins, MD  guanFACINE (TENEX) 1 MG tablet TAKE 1 TABLET(1 MG) BY MOUTH AT BEDTIME Patient not taking: Reported on 02/14/2019 08/09/16   Jake Bathe, MD  lactulose (CHRONULAC) 10 GM/15ML solution Take 45 mLs (30 g total) by mouth 3 (three) times daily as needed for mild constipation. Patient not taking: Reported on 02/14/2019 03/16/16   Corwin Levins, MD  losartan (COZAAR) 100 MG tablet TAKE 1 TABLET(100 MG) BY MOUTH DAILY 12/06/18   Jake Bathe, MD  lubiprostone (AMITIZA) 24 MCG capsule Take 1 capsule (24 mcg total) by mouth 2 (two) times daily with a meal. Patient not taking: Reported on 02/14/2019 08/31/18   Meredith Pel, NP  potassium chloride SA  (K-DUR,KLOR-CON) 20 MEQ tablet Take 1 tablet (20 mEq total) by mouth daily. 05/09/17   Jake Bathe, MD  predniSONE (DELTASONE) 10 MG tablet Take 3 tablets (30 mg total) by mouth daily for 5 days. 04/27/19 05/02/19  Latham Kinzler, Veryl Speak, PA-C  rivaroxaban (XARELTO) 20 MG TABS tablet Take 1 tablet (20 mg total) by mouth daily with supper. 01/17/19   Jake Bathe, MD  spironolactone (ALDACTONE) 25 MG tablet Take 1 tablet (25 mg total) by mouth daily. 12/27/18   Jake Bathe, MD  tiZANidine (ZANAFLEX) 4 MG tablet Take 1 tablet (4 mg total) by mouth at bedtime. Patient not taking: Reported on 02/14/2019 07/13/18   Corwin Levins, MD  Family History Family History  Problem Relation Age of Onset  . Heart Problems Mother   . Throat cancer Father   . Pancreatic cancer Sister   . Hypertension Brother   . Hypertension Sister   . Hypertension Sister   . Hypertension Sister   . Colon cancer Other   . Coronary artery disease Other     Social History Social History   Tobacco Use  . Smoking status: Never Smoker  . Smokeless tobacco: Never Used  Substance Use Topics  . Alcohol use: No  . Drug use: No     Allergies   Crestor [rosuvastatin calcium], Amlodipine, Piperacillin sod-tazobactam so, and Ultram [tramadol]   Review of Systems Review of Systems  See HPI Physical Exam Triage Vital Signs ED Triage Vitals [04/27/19 1158]  Enc Vitals Group     BP 132/83     Pulse Rate 90     Resp 16     Temp 98 F (36.7 C)     Temp src      SpO2 97 %     Weight      Height      Head Circumference      Peak Flow      Pain Score 10     Pain Loc      Pain Edu?      Excl. in [place]?    No data found.  Updated Vital Signs BP 132/83   Pulse 90   Temp 98 F (36.7 C)   Resp 16   SpO2 97%   Visual Acuity Right Eye Distance:   Left Eye Distance:   Bilateral Distance:    Right Eye Near:   Left Eye Near:    Bilateral Near:     Physical Exam Vitals and nursing note reviewed.    Constitutional:      General: She is not in acute distress.    Appearance: She is well-developed. She is not ill-appearing.  HENT:     Head: Normocephalic and atraumatic.     Ears:     Comments: Left ear with clear canal and pearly gray tympanic membrane with clear landmarks  Right ear with impacted cerumen on initial examination with improvement following nurse irrigation as well as provider irrigation with 80 cc of syringe irrigation.  Patient did have some improvement in her ear discomfort following initial irrigation.  No tenderness with manipulation of the right ear.  No preauricular posterior auricular lymphadenopathy Eyes:     Conjunctiva/sclera: Conjunctivae normal.  Cardiovascular:     Rate and Rhythm: Normal rate.  Pulmonary:     Effort: Pulmonary effort is normal. No respiratory distress.  Musculoskeletal:     Cervical back: Neck supple.     Comments: Bilateral legs with 1+ edema to mid shin.  There is bilateral and equal medial ankle swelling.  Left foot with posterior swelling at the calcaneus in the area of insertion of Achilles tendon.  Significant tender to palpation over this area.  No tenderness to palpation the proximal Achilles tendon.  No tenderness palpation of the left calf.  No tenderness the plantar aspect of the calcaneus.  No lateral tenderness of the calcaneus.  No tenderness of the ankle joint.  Patient with elicited pain with passive dorsiflexion of the left foot.  Some pain with resisted plantarflexion of the left foot.  Patient with significant pain on ambulation.  Skin:    General: Skin is warm and dry.  Neurological:     Mental  Status: She is alert.      UC Treatments / Results  Labs (all labs ordered are listed, but only abnormal results are displayed) Labs Reviewed - No data to display  EKG   Radiology No results found.  Procedures Procedures (including critical care time)  Medications Ordered in UC Medications - No data to  display  Initial Impression / Assessment and Plan / UC Course  I have reviewed the triage vital signs and the nursing notes.  Pertinent labs & imaging results that were available during my care of the patient were reviewed by me and considered in my medical decision making (see chart for details).     #Achilles bursitis #Impacted cerumen Patient 64 year old female with numerous comorbidities presenting with acute left heel pain that is consistent with bursitis.  Reports she is unable to take NSAIDs therefore we will avoid this and placed on prednisone.  Also discussed utilization of Voltaren gel topically in the area of pain.  We did trial her in a cam walker boot however this caused significant increase in pain as there is contact with posterior aspect of heel.  Consideration for crutches was also had however given issues with right knee do believe she would be unstable on crutches.  I discussed with patient that the best case scenario would be for her to initiate her anti-inflammatory therapy and get home and rest.  Patient verbalized she would be able to ambulate from her vehicle to her home.  Patient with instructions to follow-up with her primary care for continued heel pain. -Patient's urine impaction was not fully resolved in this visit however there was extensive irrigation.  I doubt seriously she has an otitis media given she is afebrile and believe that she will respond well to Debrox treatment.  She reports she will use Debrox at home. Final Clinical Impressions(s) / UC Diagnoses   Final diagnoses:  Achilles bursitis of left lower extremity  Impacted cerumen of right ear     Discharge Instructions     Take the prednisone for 5 days Take tylenol for additional pain relief Gently apply ice to the area of pain in the evenings and minimize hard objects contacting it  You may use the voltaren gel 4 times a day at the area of pain  Use the debrox drops in your right ear.  Schedule  follow up with your primary care for re-evaluation in 7-10 days  If pain becomes more severe please return      ED Prescriptions    Medication Sig Dispense Auth. Provider   predniSONE (DELTASONE) 10 MG tablet Take 3 tablets (30 mg total) by mouth daily for 5 days. 15 tablet Nyoka Alcoser, Veryl Speak, PA-C   diclofenac Sodium (VOLTAREN) 1 % GEL Apply 4 g topically 4 (four) times daily. 50 g Chakia Counts, Veryl Speak, PA-C   acetic acid-hydrocortisone (VOSOL-HC) OTIC solution  (Status: Discontinued) Place 3 drops into the right ear 2 (two) times daily for 5 days. 10 mL Keysean Savino, Veryl Speak, PA-C   carbamide peroxide (DEBROX) 6.5 % OTIC solution Place 5 drops into the right ear 2 (two) times daily. 15 mL Cylis Ayars, Veryl Speak, PA-C     PDMP not reviewed this encounter.   Hermelinda Medicus, PA-C 04/27/19 2236

## 2019-04-27 NOTE — Discharge Instructions (Addendum)
Take the prednisone for 5 days Take tylenol for additional pain relief Gently apply ice to the area of pain in the evenings and minimize hard objects contacting it  You may use the voltaren gel 4 times a day at the area of pain  Use the debrox drops in your right ear.  Schedule follow up with your primary care for re-evaluation in 7-10 days  If pain becomes more severe please return

## 2019-04-30 ENCOUNTER — Encounter: Payer: BC Managed Care – PPO | Attending: Internal Medicine | Admitting: Registered"

## 2019-04-30 ENCOUNTER — Other Ambulatory Visit: Payer: Self-pay

## 2019-04-30 ENCOUNTER — Encounter: Payer: Self-pay | Admitting: Registered"

## 2019-04-30 DIAGNOSIS — K582 Mixed irritable bowel syndrome: Secondary | ICD-10-CM | POA: Insufficient documentation

## 2019-04-30 DIAGNOSIS — Z713 Dietary counseling and surveillance: Secondary | ICD-10-CM | POA: Insufficient documentation

## 2019-04-30 NOTE — Patient Instructions (Signed)
Get your grandson to help you with finding a youtube video to keep you moving Search: "Stop in the name of Love" chair yoga dance Continue using the band for stretching. Great job on more structured meals. Look for another stool that doesn't make you feet dangle Contact below to see if they can help with the stool issue and stairs to your apartment.  [facility] [address] [place], [place] [address]  Telephone: [phone] Fax: [phone] Email: [email or web address]

## 2019-04-30 NOTE — Progress Notes (Signed)
Medical Nutrition Therapy:  Appt start time: 1025 end time:  1055.  Follow-up Assessment:  Primary concerns today. IBS weight management  Pt states she last 2 days has had formed BM. Pt reports had issues with constipation when had a gout flare and was on prednisone for 1 week due but has now resolved. Patient is happy about improvement in her IBS symptoms.   Pt reports she is now able to tolerate salad and eats it 2x week. Pt states she is getting in vegetables regularly including cabbage and green beans.   Pt reports she is not eating sandwiches often, staying away from bread. Pt states she will eat 5 vegetable crackers and is ok on stomach.   Patient stays busy at home and 3 days per week active with grandkids. Patient has a hallway approximately 29' long that she walks back and forth several times per day.  Physical limitations due to shoulder and knees. Pt states physical therapy has helped in the past but can't afford to do it now. Pt states states she wants to lose weight so she can have surgery on knee. Pt reports states she has fallen on the 2 steps up her apartment door because of her knee. Pt states standing in the kitchen while cooking causes a lot of pain. Pt reports the stool her son got for her to sit on lets her legs dangle and not a good solution.  Pt states she would like to try water aerobics but cannot afford [employer] Pt is not old enough to qualify for [employer], but her insurance company may have some gym membership benefit.  Nutrition Relevant PMH: cholecystectomy, IBS, Diverticulosis, GERD, Dysphagia   Preferred Learning Style:   No preference indicated   Learning Readiness:   Ready  MEDICATIONS: reviewed - has started allopurinol again.   DIETARY INTAKE: Avoided foods include; onions (FODMAP), nabs, fish d/t gout flare.   24-hr recall: B ( AM): slim fast, halo Snk ( AM):   L ( PM): crackers Snk ( PM):  D ( PM): grilled chicken salad Snk (  PM):  Beverages: water  Usual physical activity: ADLs - cleaning, sweeping, cleaning out tub.  Estimated energy needs: 1800 kcal  Progress Towards Goal(s):  New goals.   Nutritional Diagnosis:  NB-1.1 Food and nutrition-related knowledge deficit As related to potential role FODMAPs play in IBS.  As evidenced by patient stated lack of knowledge.    Intervention:  Nutrition Education. Topics covered today: * importance of safe exercise/movement * Structured meals  Teaching Method Utilized:  Visual Auditory  Handouts given during visit include:  none  Barriers to learning/adherence to lifestyle change: none  Demonstrated degree of understanding via:  Teach Back   Monitoring/Evaluation:  Dietary intake, exercise, IBS symptoms, and body weight in 8 week(s).

## 2019-05-08 ENCOUNTER — Encounter: Payer: Self-pay | Admitting: Internal Medicine

## 2019-05-08 ENCOUNTER — Other Ambulatory Visit: Payer: Self-pay

## 2019-05-08 ENCOUNTER — Ambulatory Visit: Payer: BC Managed Care – PPO | Admitting: Internal Medicine

## 2019-05-08 VITALS — BP 140/76 | HR 102 | Temp 98.9°F | Ht 63.5 in | Wt 344.1 lb

## 2019-05-08 DIAGNOSIS — H9191 Unspecified hearing loss, right ear: Secondary | ICD-10-CM

## 2019-05-08 DIAGNOSIS — Z713 Dietary counseling and surveillance: Secondary | ICD-10-CM | POA: Diagnosis not present

## 2019-05-08 DIAGNOSIS — E559 Vitamin D deficiency, unspecified: Secondary | ICD-10-CM

## 2019-05-08 DIAGNOSIS — R739 Hyperglycemia, unspecified: Secondary | ICD-10-CM | POA: Diagnosis not present

## 2019-05-08 DIAGNOSIS — M79672 Pain in left foot: Secondary | ICD-10-CM | POA: Diagnosis not present

## 2019-05-08 DIAGNOSIS — Z0000 Encounter for general adult medical examination without abnormal findings: Secondary | ICD-10-CM

## 2019-05-08 DIAGNOSIS — E538 Deficiency of other specified B group vitamins: Secondary | ICD-10-CM

## 2019-05-08 MED ORDER — PREDNISONE 10 MG PO TABS
ORAL_TABLET | ORAL | 0 refills | Status: DC
Start: 2019-05-08 — End: 2019-08-09

## 2019-05-08 NOTE — Patient Instructions (Signed)
As your left heel is improved, ok for a repeat smaller dose of prednisone   Ok to use the Voltaren gel topically as well  Your right ear was irrigated of wax today  Please continue all other medications as before, and refills have been done if requested.  Please have the pharmacy call with any other refills you may need.  Please continue your efforts at being more active, low cholesterol diet, and weight control.  Please keep your appointments with your specialists as you may have planned

## 2019-05-08 NOTE — Progress Notes (Signed)
Subjective:    Patient ID: Mia [name], female    DOB: Feb 21, 1955, 64 y.o.   MRN: [id number]  HPI    Here to f/u; overall doing ok,  Pt denies chest pain, increasing sob or doe, wheezing, orthopnea, PND, increased LE swelling, palpitations, dizziness or syncope.  Pt denies new neurological symptoms such as new headache, or facial or extremity weakness or numbness.  Pt denies polydipsia, polyuria, or low sugar episode.  Pt states overall good compliance with meds, mostly trying to follow appropriate diet, with wt overall stable,  but little exercise however.  Trying more diligently to lose wt in the past 3 mo, working with a nutritionist, has occasional constipation with some diet changes.  Seen recently at [facility] with achilles tendonitis, tx with predisone with some temporary improvement, trying to come back now but at least no longer forced to walk with walker.  Did not try the voltaren gel. .  Right ear some improved with the debrox, no worsening pain or d/c or fever. On fixed income, no money for PT or gyms wth pool exercise.  Also with c/o right ear hearing loss and mild irritation Past Medical History:  Diagnosis Date  . Allergic rhinitis, cause unspecified 04/29/2011  . Anemia   . Anemia, unspecified 04/23/2011  . Chronic anticoagulation   . Colon polyps may 2011  . Diverticulosis   . DVT (deep venous thrombosis) (HCC)    right upper extremitiy  . GERD (gastroesophageal reflux disease) 11/10/2011  . Gout 04/22/2010  . H/O: GI bleed   . Hyperlipidemia   . Hyperplastic colon polyp   . Hypertension   . IBS (irritable bowel syndrome)   . Infected prosthetic knee joint (HCC)   . Migraine 04/29/2011  . Morbid obesity (HCC)   . Nonsmoker   . Pulmonary embolism (HCC) 2010   following right knee replacement  . WPW (Wolff-Parkinson-White syndrome)    Past Surgical History:  Procedure Laterality Date  . CHOLECYSTECTOMY    . TOTAL KNEE ARTHROPLASTY Right 2010   followed by I & D for  infection  . TUBAL LIGATION      reports that she has never smoked. She has never used smokeless tobacco. She reports that she does not drink alcohol or use drugs. family history includes Colon cancer in an other family member; Coronary artery disease in an other family member; Heart Problems in her mother; Hypertension in her brother, sister, sister, and sister; Pancreatic cancer in her sister; Throat cancer in her father. Allergies  Allergen Reactions  . Crestor [Rosuvastatin Calcium] Other (See Comments)    Chest pain  . Amlodipine Other (See Comments)    Edema,ha  . Piperacillin Sod-Tazobactam So Other (See Comments)    REACTION: "UNSURE"  . Ultram [Tramadol] Nausea Only   Current Outpatient Medications on File Prior to Visit  Medication Sig Dispense Refill  . Acetaminophen (TYLENOL PO) Take by mouth.    Marland Kitchen allopurinol (ZYLOPRIM) 300 MG tablet Take 1 tablet (300 mg total) by mouth daily. 90 tablet 1  . atorvastatin (LIPITOR) 40 MG tablet Take 1 tablet (40 mg total) by mouth daily. 90 tablet 3  . carbamide peroxide (DEBROX) 6.5 % OTIC solution Place 5 drops into the right ear 2 (two) times daily. 15 mL 0  . carvedilol (COREG) 6.25 MG tablet Take 1 tablet (6.25 mg total) by mouth 2 (two) times daily. 180 tablet 3  . diclofenac Sodium (VOLTAREN) 1 % GEL Apply 4 g topically 4 (four)  times daily. 50 g 0  . furosemide (LASIX) 40 MG tablet Take 1.5 tablets (60 mg total) by mouth 2 (two) times daily. 135 tablet 3  . guanFACINE (TENEX) 1 MG tablet TAKE 1 TABLET(1 MG) BY MOUTH AT BEDTIME 90 tablet 2  . lactulose (CHRONULAC) 10 GM/15ML solution Take 45 mLs (30 g total) by mouth 3 (three) times daily as needed for mild constipation. 240 mL 5  . losartan (COZAAR) 100 MG tablet TAKE 1 TABLET(100 MG) BY MOUTH DAILY 30 tablet 11  . lubiprostone (AMITIZA) 24 MCG capsule Take 1 capsule (24 mcg total) by mouth 2 (two) times daily with a meal. 60 capsule 3  . potassium chloride SA (K-DUR,KLOR-CON) 20 MEQ  tablet Take 1 tablet (20 mEq total) by mouth daily. 90 tablet 3  . rivaroxaban (XARELTO) 20 MG TABS tablet Take 1 tablet (20 mg total) by mouth daily with supper. 30 tablet 11  . spironolactone (ALDACTONE) 25 MG tablet Take 1 tablet (25 mg total) by mouth daily. 90 tablet 3  . tiZANidine (ZANAFLEX) 4 MG tablet Take 1 tablet (4 mg total) by mouth at bedtime. 30 tablet 5  . acetic acid-hydrocortisone (VOSOL-HC) OTIC solution Place 3 drops into the right ear 2 (two) times daily.     No current facility-administered medications on file prior to visit.   Review of Systems All otherwise neg per pt    Objective:   Physical Exam BP 140/76 (BP Location: Right Arm, Patient Position: Sitting, Cuff Size: Large)   Pulse (!) 102   Temp 98.9 F (37.2 C) (Oral)   Ht 5' 3.5" (1.613 m)   Wt (!) 344 lb 2 oz (156.1 kg)   SpO2 97%   BMI 60.00 kg/m  VS noted,  Constitutional: Pt appears in NAD HENT: Head: NCAT.  Right Ear: External ear normal. Right canal irrigated of wax impaction and hearing improved Left Ear: External ear normal.  Eyes: . Pupils are equal, round, and reactive to light. Conjunctivae and EOM are normal Nose: without d/c or deformity Neck: Neck supple. Gross normal ROM Cardiovascular: Normal rate and regular rhythm.   Pulmonary/Chest: Effort normal and breath sounds without rales or wheezing.  Abd:  Soft, NT, ND, + BS, no organomegaly Neurological: Pt is alert. At baseline orientation, motor grossly intact Skin: Skin is warm. No rashes, other new lesions, no LE edema Psychiatric: Pt behavior is normal without agitation  Left post heel with mild swelling tender  Lab Results  Component Value Date   WBC 9.1 11/21/2018   HGB 15.3 11/21/2018   HCT 46.1 11/21/2018   PLT 266 11/21/2018   GLUCOSE 110 (H) 01/29/2019   CHOL 251 (H) 07/13/2018   TRIG 101.0 07/13/2018   HDL 61.50 07/13/2018   LDLDIRECT 148.9 12/11/2012   LDLCALC 169 (H) 07/13/2018   ALT 12 03/16/2016   AST 12  03/16/2016   NA 141 01/29/2019   K 3.9 01/29/2019   CL 105 01/29/2019   CREATININE 0.90 01/29/2019   BUN 18 01/29/2019   CO2 31 01/29/2019   TSH 2.15 07/13/2018   INR 2.8 03/28/2018   HGBA1C 6.3 07/13/2018        Assessment & Plan:

## 2019-05-08 NOTE — Assessment & Plan Note (Signed)
To continue for wt loss

## 2019-05-09 ENCOUNTER — Encounter: Payer: Self-pay | Admitting: Internal Medicine

## 2019-05-09 NOTE — Assessment & Plan Note (Signed)
Improved with irrigatinon,  to f/u any worsening symptoms or concerns

## 2019-05-09 NOTE — Assessment & Plan Note (Signed)
stable overall by history and exam, recent data reviewed with pt, and pt to continue medical treatment as before,  to f/u any worsening symptoms or concerns  

## 2019-05-09 NOTE — Assessment & Plan Note (Addendum)
Improved byt persistent, for repeat low dose predpac asd, voltaren gel prn  I spent 31 minutes in preparing to see the patient by review of recent labs, imaging and procedures, obtaining and reviewing separately obtained history, communicating with the patient and family or caregiver, ordering medications, tests or procedures, and documenting clinical information in the EHR including the differential Dx, treatment, and any further evaluation and other management of let heel pain, right hearing loss, nutrition counseling, hyperglycemia

## 2019-06-08 ENCOUNTER — Encounter: Payer: Self-pay | Admitting: Registered"

## 2019-06-08 ENCOUNTER — Other Ambulatory Visit: Payer: Self-pay

## 2019-06-08 ENCOUNTER — Encounter: Payer: BC Managed Care – PPO | Attending: Internal Medicine | Admitting: Registered"

## 2019-06-08 DIAGNOSIS — K5900 Constipation, unspecified: Secondary | ICD-10-CM

## 2019-06-08 DIAGNOSIS — K582 Mixed irritable bowel syndrome: Secondary | ICD-10-CM | POA: Diagnosis present

## 2019-06-08 NOTE — Progress Notes (Signed)
Follow-up Medical Nutrition Therapy:  Appt start time: 1035 end time:  1105.  Follow-up Assessment:  Primary concerns today. IBS weight management. Constipation: bowel movements hard.  Patient states the month of May has been very difficult due to increased pain. Pt states due to bursitis in heel she started a course of predisone. Patient also states she has had gout flares. Pt states her pharmacist told her bananas contribute to gout.  From last visit: RD suggested modifications to her living space due to fall risk, none were made. Patient states she asked landlord about ramp to entry door as well as safety bar in bathroom. Pt states he did not want to make these changes to the property, stating it is old and thought the bar would pull the tiles off the wall and too much prep work to put in ramp.  Patient was not able to get a working tablet to do video chair exercise but states she does a lot of stretching and moving her arms around while sitting on couch. Patient reports she has a long hallway in her house and also tries to go out and get the mail 2x/week. Pt states sh has a long driveway and takes time to get up the hill. Pt states she also moves a lot cleaning her house daily. Pt states she also continues to go to [employer] 2-3 x week, walks around store.  Patient states she still has Slimfast and a bottle of water for breakfast unless she will be going out of the house. On days she has errands will not eat or even drink water because she does not want to use the restroom when out in public.  Pt states yesterday had 1/2 [employer] pecan chicken salad sandwich, that had apples and grapes in it and states this felt good on her stomach. Plain chicken drum sticks string beans, and strawberries was fine on stomach Sunday. Pt states she ate some anti-oxidant mix that helped her bowels move.  Nutrition Relevant PMH: cholecystectomy, IBS, Diverticulosis, GERD, Dysphagia   Preferred Learning Style:   No  preference indicated   Learning Readiness:   Contemplating  MEDICATIONS: reviewed - taking allopurinol regularly.   DIETARY INTAKE: Avoided foods include; onions (FODMAP), nabs, fish d/t gout flare.   24-hr recall: B ( AM): slim fast, halo Snk ( AM):   L ( PM):  Snk ( PM):  D ( PM): pecan chicken salad sandwich Snk ( PM):  Beverages: water  Usual physical activity: ADLs - cleaning, sweeping, cleaning out tub, getting mail, walking around grocery store  Estimated energy needs: 1800 kcal  Progress Towards Goal(s):  New goals.   Nutritional Diagnosis:  NB-1.1 Food and nutrition-related knowledge deficit As related to potential role FODMAPs play in IBS.  As evidenced by patient stated lack of knowledge.    Intervention:  Nutrition Education. Topics covered today: * reviewed safe exercise/movement * role of water to help bowel movements  Teaching Method Utilized:  Visual Auditory  Handouts given during visit include:  none  Barriers to learning/adherence to lifestyle change: pain and fear of needing to use the bathroom in public.  Demonstrated degree of understanding via:  Teach Back   Monitoring/Evaluation:  Dietary intake, exercise, IBS symptoms, and body weight prn.

## 2019-06-25 ENCOUNTER — Telehealth: Payer: Self-pay | Admitting: Cardiology

## 2019-06-25 NOTE — Telephone Encounter (Signed)
Called pt to inform her that the samples of medications are for pts that is just starting on the medications and that I needed to reach out to our pt assistance coordinator, Elease Hashimoto Via, LPN, to assist in this matter. Pt states that her medication is too expensive and that she can not afford to get it. Please address

## 2019-06-25 NOTE — Telephone Encounter (Signed)
Patient calling the office for samples of medication:   1.  What medication and dosage are you requesting samples for? rivaroxaban (XARELTO) 20 MG TABS tablet  2.  Are you currently out of this medication? Patient states she has been completely out for 3 days.

## 2019-06-26 NOTE — Telephone Encounter (Signed)
**Note De-Identified Mia [name] Obfuscation** No answer at the pts home phone and no way to leave a message. I called the pts cell phone and got no answer but was able to leave a message on her VM asking her to call Larita Fife at [facility] concerning her Xarelto.

## 2019-06-26 NOTE — Telephone Encounter (Signed)
Follow Up:   Returning your call, concerning her Xarelto.

## 2019-06-26 NOTE — Telephone Encounter (Signed)
**Note De-Identified Georgette Helmer Obfuscation** The pt has commercial [employer]BS ins but she states that workers comp is suppose to pay for her Xarelto and that they are not. She states that she has called and left messages asking someone from workers comp to call her back but no one has and that she has been out of Xarelto for 4 days.  I have advised her that we are leaving her a $10 Xarelto co-pay card and Xarelto samples for her to pick up at our Covid Screening table lovated in the downstairs lobby of [facility] office in [place] to pick.  She thanked me for our help and verbalized understand. She states that she will call and let us know if the co-pay card does not work for her.

## 2019-07-17 ENCOUNTER — Ambulatory Visit: Payer: BC Managed Care – PPO | Admitting: Internal Medicine

## 2019-07-25 ENCOUNTER — Other Ambulatory Visit: Payer: Self-pay | Admitting: Internal Medicine

## 2019-08-09 ENCOUNTER — Other Ambulatory Visit: Payer: Self-pay

## 2019-08-09 ENCOUNTER — Ambulatory Visit (INDEPENDENT_AMBULATORY_CARE_PROVIDER_SITE_OTHER): Payer: BC Managed Care – PPO | Admitting: Internal Medicine

## 2019-08-09 ENCOUNTER — Encounter: Payer: Self-pay | Admitting: Internal Medicine

## 2019-08-09 VITALS — BP 140/80 | HR 83 | Temp 98.4°F | Ht 63.5 in | Wt 344.0 lb

## 2019-08-09 DIAGNOSIS — E538 Deficiency of other specified B group vitamins: Secondary | ICD-10-CM | POA: Diagnosis not present

## 2019-08-09 DIAGNOSIS — E559 Vitamin D deficiency, unspecified: Secondary | ICD-10-CM | POA: Diagnosis not present

## 2019-08-09 DIAGNOSIS — R739 Hyperglycemia, unspecified: Secondary | ICD-10-CM

## 2019-08-09 DIAGNOSIS — Z0000 Encounter for general adult medical examination without abnormal findings: Secondary | ICD-10-CM

## 2019-08-09 DIAGNOSIS — E785 Hyperlipidemia, unspecified: Secondary | ICD-10-CM

## 2019-08-09 DIAGNOSIS — I10 Essential (primary) hypertension: Secondary | ICD-10-CM

## 2019-08-09 DIAGNOSIS — Z0001 Encounter for general adult medical examination with abnormal findings: Secondary | ICD-10-CM

## 2019-08-09 DIAGNOSIS — M109 Gout, unspecified: Secondary | ICD-10-CM | POA: Diagnosis not present

## 2019-08-09 MED ORDER — HYDROCODONE-ACETAMINOPHEN 5-325 MG PO TABS: 1.0000 | ORAL_TABLET | Freq: Four times a day (QID) | ORAL | 0 refills | Status: DC | PRN

## 2019-08-09 MED ORDER — PREDNISONE 10 MG PO TABS: ORAL_TABLET | ORAL | 0 refills | Status: DC

## 2019-08-09 MED ORDER — METHYLPREDNISOLONE ACETATE 80 MG/ML IJ SUSP
80.0000 mg | Freq: Once | INTRAMUSCULAR | Status: AC
  Administered 2019-08-09: 80 mg via INTRAMUSCULAR

## 2019-08-09 NOTE — Addendum Note (Signed)
Addended by: Merrilyn Puma on: 08/09/2019 02:38 PM   Modules accepted: Orders

## 2019-08-09 NOTE — Addendum Note (Signed)
Addended by: Merrilyn Puma on: 08/09/2019 02:39 PM   Modules accepted: Orders

## 2019-08-09 NOTE — Assessment & Plan Note (Signed)

## 2019-08-09 NOTE — Assessment & Plan Note (Signed)
stable overall by history and exam, recent data reviewed with pt, and pt to continue medical treatment as before,  to f/u any worsening symptoms or concerns  

## 2019-08-09 NOTE — Addendum Note (Signed)
Addended by: Aidon Klemens N on: 08/09/2019 02:39 PM   Modules accepted: Orders  

## 2019-08-09 NOTE — Patient Instructions (Signed)
You had the steroid shot today  Please take all new medication as prescribed - the pain medication, and prednisone  Please continue all other medications as before, and refills have been done if requested.  Please have the pharmacy call with any other refills you may need.  Please continue your efforts at being more active, low cholesterol diet, and weight control.  You are otherwise up to date with prevention measures today.  Please keep your appointments with your specialists as you may have planned  Please go to the LAB at the blood drawing area for the tests to be done  You will be contacted by phone if any changes need to be made immediately.  Otherwise, you will receive a letter about your results with an explanation, but please check with MyChart first.  Please remember to sign up for MyChart if you have not done so, as this will be important to you in the future with finding out test results, communicating by private email, and scheduling acute appointments online when needed.  Please make an Appointment to return in 6 months, or sooner if needed

## 2019-08-09 NOTE — Addendum Note (Signed)
Addended by: Shandria Clinch N on: 08/09/2019 02:38 PM   Modules accepted: Orders  

## 2019-08-09 NOTE — Assessment & Plan Note (Addendum)
Mod to severe, for depomedrol im 80, predpac asd, pain control,,  to f/u any worsening symptoms or concerns  I spent 31 minutes in addition to time for CPX wellness examination in preparing to see the patient by review of recent labs, imaging and procedures, obtaining and reviewing separately obtained history, communicating with the patient and family or caregiver, ordering medications, tests or procedures, and documenting clinical information in the EHR including the differential Dx, treatment, and any further evaluation and other management of acute gouty arhtritis, hyperglycemia, htn, hld

## 2019-08-09 NOTE — Addendum Note (Signed)
Addended by: Andreana Klingerman N on: 08/09/2019 02:38 PM   Modules accepted: Orders  

## 2019-08-09 NOTE — Progress Notes (Signed)
Subjective:    Patient ID: Mia [name], female    DOB: 10-05-1955, 64 y.o.   MRN: [id number]  HPI  Here for wellness and f/u;  Overall doing ok;  Pt denies Chest pain, worsening SOB, DOE, wheezing, orthopnea, PND, worsening LE edema, palpitations, dizziness or syncope.  Pt denies neurological change such as new headache, facial or extremity weakness.  Pt denies polydipsia, polyuria, or low sugar symptoms. Pt states overall good compliance with treatment and medications, good tolerability, and has been trying to follow appropriate diet.  Pt denies worsening depressive symptoms, suicidal ideation or panic. No fever, night sweats, wt loss, loss of appetite, or other constitutional symptoms.  Pt states good ability with ADL's, has low fall risk, home safety reviewed and adequate, no other significant changes in hearing or vision, and only occasionally active with exercise. Also c/o 1 wk onset severe right foot first mtp pain, red, swelling worse to walk, better to sit, constant, no falls.   Past Medical History:  Diagnosis Date  . Allergic rhinitis, cause unspecified 04/29/2011  . Anemia   . Anemia, unspecified 04/23/2011  . Chronic anticoagulation   . Colon polyps may 2011  . Diverticulosis   . DVT (deep venous thrombosis) (HCC)    right upper extremitiy  . GERD (gastroesophageal reflux disease) 11/10/2011  . Gout 04/22/2010  . H/O: GI bleed   . Hyperlipidemia   . Hyperplastic colon polyp   . Hypertension   . IBS (irritable bowel syndrome)   . Infected prosthetic knee joint (HCC)   . Migraine 04/29/2011  . Morbid obesity (HCC)   . Nonsmoker   . Pulmonary embolism (HCC) 2010   following right knee replacement  . WPW (Wolff-Parkinson-White syndrome)    Past Surgical History:  Procedure Laterality Date  . CHOLECYSTECTOMY    . TOTAL KNEE ARTHROPLASTY Right 2010   followed by I & D for infection  . TUBAL LIGATION      reports that she has never smoked. She has never used smokeless  tobacco. She reports that she does not drink alcohol and does not use drugs. family history includes Colon cancer in an other family member; Coronary artery disease in an other family member; Heart Problems in her mother; Hypertension in her brother, sister, sister, and sister; Pancreatic cancer in her sister; Throat cancer in her father. Allergies  Allergen Reactions  . Crestor [Rosuvastatin Calcium] Other (See Comments)    Chest pain  . Amlodipine Other (See Comments)    Edema,ha  . Piperacillin Sod-Tazobactam So Other (See Comments)    REACTION: "UNSURE"  . Ultram [Tramadol] Nausea Only   Current Outpatient Medications on File Prior to Visit  Medication Sig Dispense Refill  . Acetaminophen (TYLENOL PO) Take by mouth.    Marland Kitchen acetic acid-hydrocortisone (VOSOL-HC) OTIC solution Place 3 drops into the right ear 2 (two) times daily.    Marland Kitchen allopurinol (ZYLOPRIM) 300 MG tablet Take 1 tablet (300 mg total) by mouth daily. 90 tablet 1  . atorvastatin (LIPITOR) 40 MG tablet Take 1 tablet (40 mg total) by mouth daily. 90 tablet 3  . carbamide peroxide (DEBROX) 6.5 % OTIC solution Place 5 drops into the right ear 2 (two) times daily. 15 mL 0  . carvedilol (COREG) 6.25 MG tablet Take 1 tablet (6.25 mg total) by mouth 2 (two) times daily. 180 tablet 3  . diclofenac Sodium (VOLTAREN) 1 % GEL Apply 4 g topically 4 (four) times daily. 50 g 0  . guanFACINE (  TENEX) 1 MG tablet TAKE 1 TABLET(1 MG) BY MOUTH AT BEDTIME 90 tablet 2  . lactulose (CHRONULAC) 10 GM/15ML solution Take 45 mLs (30 g total) by mouth 3 (three) times daily as needed for mild constipation. 240 mL 5  . losartan (COZAAR) 100 MG tablet TAKE 1 TABLET(100 MG) BY MOUTH DAILY 30 tablet 11  . lubiprostone (AMITIZA) 24 MCG capsule Take 1 capsule (24 mcg total) by mouth 2 (two) times daily with a meal. 60 capsule 3  . potassium chloride SA (K-DUR,KLOR-CON) 20 MEQ tablet Take 1 tablet (20 mEq total) by mouth daily. 90 tablet 3  . rivaroxaban  (XARELTO) 20 MG TABS tablet Take 1 tablet (20 mg total) by mouth daily with supper. 30 tablet 11  . spironolactone (ALDACTONE) 25 MG tablet Take 1 tablet (25 mg total) by mouth daily. 90 tablet 3  . tiZANidine (ZANAFLEX) 4 MG tablet TAKE 1 TABLET(4 MG) BY MOUTH AT BEDTIME 30 tablet 2  . furosemide (LASIX) 40 MG tablet Take 1.5 tablets (60 mg total) by mouth 2 (two) times daily. 135 tablet 3   No current facility-administered medications on file prior to visit.   Review of Systems All otherwise neg per pt    Objective:   Physical Exam BP 140/80 (BP Location: Left Arm, Patient Position: Sitting, Cuff Size: Large)   Pulse 83   Temp 98.4 F (36.9 C) (Oral)   Ht 5' 3.5" (1.613 m)   Wt (!) 344 lb (156 kg)   SpO2 97%   BMI 59.98 kg/m  VS noted,  Constitutional: Pt appears in NAD HENT: Head: NCAT.  Right Ear: External ear normal.  Left Ear: External ear normal.  Eyes: . Pupils are equal, round, and reactive to light. Conjunctivae and EOM are normal Nose: without d/c or deformity Neck: Neck supple. Gross normal ROM Cardiovascular: Normal rate and regular rhythm.   Pulmonary/Chest: Effort normal and breath sounds without rales or wheezing.  Abd:  Soft, NT, ND, + BS, no organomegaly Neurological: Pt is alert. At baseline orientation, motor grossly intact Skin: Skin is warm. No rashes, other new lesions, no LE edema Psychiatric: Pt behavior is normal without agitation  All otherwise neg per pt  Lab Results  Component Value Date   WBC 9.1 11/21/2018   HGB 15.3 11/21/2018   HCT 46.1 11/21/2018   PLT 266 11/21/2018   GLUCOSE 110 (H) 01/29/2019   CHOL 251 (H) 07/13/2018   TRIG 101.0 07/13/2018   HDL 61.50 07/13/2018   LDLDIRECT 148.9 12/11/2012   LDLCALC 169 (H) 07/13/2018   ALT 12 03/16/2016   AST 12 03/16/2016   NA 141 01/29/2019   K 3.9 01/29/2019   CL 105 01/29/2019   CREATININE 0.90 01/29/2019   BUN 18 01/29/2019   CO2 31 01/29/2019   TSH 2.15 07/13/2018   INR 2.8  03/28/2018   HGBA1C 6.3 07/13/2018      Assessment & Plan:

## 2019-08-09 NOTE — Addendum Note (Signed)
Addended by: Iasia Forcier N on: 08/09/2019 02:38 PM   Modules accepted: Orders  

## 2019-08-09 NOTE — Addendum Note (Signed)
Addended by: SIMMONS, STACEY N on: 08/09/2019 02:38 PM   Modules accepted: Orders  

## 2019-08-09 NOTE — Addendum Note (Signed)
Addended by: Haniel Fix N on: 08/09/2019 02:38 PM   Modules accepted: Orders  

## 2019-08-09 NOTE — Addendum Note (Signed)
Addended by: Berkley Cronkright N on: 08/09/2019 02:38 PM   Modules accepted: Orders  

## 2019-08-10 ENCOUNTER — Other Ambulatory Visit: Payer: Self-pay | Admitting: Internal Medicine

## 2019-08-10 ENCOUNTER — Encounter: Payer: Self-pay | Admitting: Internal Medicine

## 2019-08-10 LAB — LIPID PANEL
Cholesterol: 222 mg/dL — ABNORMAL HIGH (ref <200)
HDL: 46 mg/dL — ABNORMAL LOW (ref >=50)
LDL Cholesterol (Calc): 153 mg/dL (calc) — ABNORMAL HIGH
Non-HDL Cholesterol (Calc): 176 mg/dL (calc) — ABNORMAL HIGH (ref <130)
Total CHOL/HDL Ratio: 4.8 (calc) (ref <5.0)
Triglycerides: 111 mg/dL (ref <150)

## 2019-08-10 LAB — URINALYSIS, ROUTINE W REFLEX MICROSCOPIC
Bacteria, UA: NONE SEEN /HPF
Bilirubin Urine: NEGATIVE
Glucose, UA: NEGATIVE
Hgb urine dipstick: NEGATIVE
Hyaline Cast: NONE SEEN /LPF
Ketones, ur: NEGATIVE
Nitrite: NEGATIVE
Protein, ur: NEGATIVE
Specific Gravity, Urine: 1.02 (ref 1.001–1.03)
pH: 5.5 (ref 5.0–8.0)

## 2019-08-10 LAB — CBC WITH DIFFERENTIAL/PLATELET
Absolute Monocytes: 451 cells/uL (ref 200–950)
Basophils Absolute: 20 cells/uL (ref 0–200)
Basophils Relative: 0.2 %
Eosinophils Absolute: 20 cells/uL (ref 15–500)
Eosinophils Relative: 0.2 %
HCT: 42.2 % (ref 35.0–45.0)
Hemoglobin: 13.7 g/dL (ref 11.7–15.5)
Lymphs Abs: 1793 cells/uL (ref 850–3900)
MCH: 27.5 pg (ref 27.0–33.0)
MCHC: 32.5 g/dL (ref 32.0–36.0)
MCV: 84.7 fL (ref 80.0–100.0)
MPV: 11.7 fL (ref 7.5–12.5)
Monocytes Relative: 4.6 %
Neutro Abs: 7517 cells/uL (ref 1500–7800)
Neutrophils Relative %: 76.7 %
Platelets: 266 10*3/uL (ref 140–400)
RBC: 4.98 10*6/uL (ref 3.80–5.10)
RDW: 14.4 % (ref 11.0–15.0)
Total Lymphocyte: 18.3 %
WBC: 9.8 10*3/uL (ref 3.8–10.8)

## 2019-08-10 LAB — VITAMIN B12: Vitamin B-12: 362 pg/mL (ref 200–1100)

## 2019-08-10 LAB — HEMOGLOBIN A1C
Hgb A1c MFr Bld: 6.5 % of total Hgb — ABNORMAL HIGH (ref <5.7)
Mean Plasma Glucose: 140 (calc)
eAG (mmol/L): 7.7 (calc)

## 2019-08-10 LAB — HEPATIC FUNCTION PANEL
AG Ratio: 1.2 (calc) (ref 1.0–2.5)
ALT: 7 U/L (ref 6–29)
AST: 10 U/L (ref 10–35)
Albumin: 3.7 g/dL (ref 3.6–5.1)
Alkaline phosphatase (APISO): 65 U/L (ref 37–153)
Bilirubin, Direct: 0.1 mg/dL (ref 0.0–0.2)
Globulin: 3.1 g/dL (calc) (ref 1.9–3.7)
Indirect Bilirubin: 0.4 mg/dL (calc) (ref 0.2–1.2)
Total Bilirubin: 0.5 mg/dL (ref 0.2–1.2)
Total Protein: 6.8 g/dL (ref 6.1–8.1)

## 2019-08-10 LAB — BASIC METABOLIC PANEL
BUN: 17 mg/dL (ref 7–25)
CO2: 28 mmol/L (ref 20–32)
Calcium: 10 mg/dL (ref 8.6–10.4)
Chloride: 106 mmol/L (ref 98–110)
Creat: 0.76 mg/dL (ref 0.50–0.99)
Glucose, Bld: 138 mg/dL — ABNORMAL HIGH (ref 65–99)
Potassium: 4.1 mmol/L (ref 3.5–5.3)
Sodium: 140 mmol/L (ref 135–146)

## 2019-08-10 LAB — URIC ACID: Uric Acid, Serum: 5.7 mg/dL (ref 2.5–7.0)

## 2019-08-10 LAB — TSH: TSH: 1.01 mIU/L (ref 0.40–4.50)

## 2019-08-10 LAB — VITAMIN D 25 HYDROXY (VIT D DEFICIENCY, FRACTURES): Vit D, 25-Hydroxy: 20 ng/mL — ABNORMAL LOW (ref 30–100)

## 2019-08-10 MED ORDER — ATORVASTATIN CALCIUM 80 MG PO TABS: 80.0000 mg | ORAL_TABLET | Freq: Every day | ORAL | 3 refills | Status: DC

## 2019-08-10 MED ORDER — VITAMIN D (ERGOCALCIFEROL) 1.25 MG (50000 UNIT) PO CAPS: 50000.0000 [IU] | ORAL_CAPSULE | ORAL | 0 refills | Status: DC

## 2019-08-30 ENCOUNTER — Telehealth: Payer: Self-pay

## 2019-08-30 NOTE — Telephone Encounter (Signed)
New message    Calling for test results  

## 2019-08-31 ENCOUNTER — Other Ambulatory Visit: Payer: Self-pay

## 2019-08-31 MED ORDER — VITAMIN D (ERGOCALCIFEROL) 1.25 MG (50000 UNIT) PO CAPS: 50000.0000 [IU] | ORAL_CAPSULE | ORAL | 0 refills | Status: DC

## 2019-08-31 MED ORDER — ATORVASTATIN CALCIUM 80 MG PO TABS: 80.0000 mg | ORAL_TABLET | Freq: Every day | ORAL | 3 refills | Status: DC

## 2019-08-31 NOTE — Telephone Encounter (Signed)
Spoke with pt and informed her of the med change along and labs. Pt stated she understood.

## 2019-10-07 ENCOUNTER — Other Ambulatory Visit: Payer: Self-pay | Admitting: Internal Medicine

## 2019-10-07 NOTE — Telephone Encounter (Signed)
Please refill as per office routine med refill policy (all routine meds refilled for 3 mo or monthly per pt preference up to one year from last visit, then month to month grace period for 3 mo, then further med refills will have to be denied)  

## 2019-10-07 NOTE — Telephone Encounter (Signed)
Sorry, no refill needed 

## 2019-12-05 ENCOUNTER — Other Ambulatory Visit: Payer: Self-pay | Admitting: Cardiology

## 2019-12-07 ENCOUNTER — Other Ambulatory Visit: Payer: Self-pay | Admitting: Internal Medicine

## 2020-01-02 ENCOUNTER — Other Ambulatory Visit: Payer: Self-pay | Admitting: Cardiology

## 2020-01-02 DIAGNOSIS — I5032 Chronic diastolic (congestive) heart failure: Secondary | ICD-10-CM

## 2020-01-02 DIAGNOSIS — I456 Pre-excitation syndrome: Secondary | ICD-10-CM

## 2020-01-02 DIAGNOSIS — I10 Essential (primary) hypertension: Secondary | ICD-10-CM

## 2020-01-02 DIAGNOSIS — Z5181 Encounter for therapeutic drug level monitoring: Secondary | ICD-10-CM

## 2020-01-07 ENCOUNTER — Other Ambulatory Visit: Payer: Self-pay | Admitting: Cardiology

## 2020-01-07 ENCOUNTER — Telehealth: Payer: Self-pay

## 2020-01-07 NOTE — Telephone Encounter (Signed)
**Note De-Identified Roise Emert Obfuscation** I started a Xarelto PA through covermymeds and received the following message: Oda Placke Key: [id number]  Outcome: This medication may be excluded from the patient's benefit. For more information, please reach out to [facility] directly at [phone]. Message from [facility]: Drug is not covered by plan Drug Xarelto 20MG  tablets Form [employer] Electronic PA Form [phone] NCPDP)  Since this has happened several times since the pt started taking Xarelto I called [facility] to see if a PA is needed and was advised that the pts Xarelto went through without any issues and that her ins covered it. No PA required at this time.

## 2020-01-08 NOTE — Telephone Encounter (Signed)
Pt has an appt scheduled for 01/10/20. She requested a message be sent to have refill request sent now that her f/u is scheduled.

## 2020-01-10 ENCOUNTER — Other Ambulatory Visit: Payer: Self-pay

## 2020-01-10 ENCOUNTER — Encounter: Payer: Self-pay | Admitting: Cardiology

## 2020-01-10 ENCOUNTER — Ambulatory Visit (INDEPENDENT_AMBULATORY_CARE_PROVIDER_SITE_OTHER): Payer: Self-pay | Admitting: Cardiology

## 2020-01-10 VITALS — BP 120/82 | HR 87 | Ht 63.5 in | Wt 346.8 lb

## 2020-01-10 DIAGNOSIS — Z86718 Personal history of other venous thrombosis and embolism: Secondary | ICD-10-CM

## 2020-01-10 DIAGNOSIS — I456 Pre-excitation syndrome: Secondary | ICD-10-CM

## 2020-01-10 NOTE — Patient Instructions (Signed)
Medication Instructions:  The current medical regimen is effective;  continue present plan and medications.  *If you need a refill on your cardiac medications before your next appointment, please call your pharmacy*  Follow-Up: At [facility], you and your health needs are our priority.  As part of our continuing mission to provide you with exceptional heart care, we have created designated Provider Care Teams.  These Care Teams include your primary Cardiologist (physician) and Advanced Practice Providers (APPs -  Physician Assistants and Nurse Practitioners) who all work together to provide you with the care you need, when you need it.  We recommend signing up for the patient portal called "MyChart".  Sign up information is provided on this After Visit Summary.  MyChart is used to connect with patients for Virtual Visits (Telemedicine).  Patients are able to view lab/test results, encounter notes, upcoming appointments, etc.  Non-urgent messages can be sent to your provider as well.   To learn more about what you can do with MyChart, go to [email or web address].    Your next appointment:   12 month(s)  The format for your next appointment:   In Person  Provider:   Mark Skains, MD   Thank you for choosing [facility]!!      

## 2020-01-10 NOTE — Progress Notes (Signed)
Cardiology Office Note:    Date:  01/10/2020   ID:  Mia [name], DOB 09-18-1955, MRN [id number]  PCP:  Biagio Borg, MD  [facility] Cardiologist:  Candee Furbish, MD  [facility] Electrophysiologist:  None   Referring MD: Biagio Borg, MD     History of Present Illness:    Mia [name] is a 65 y.o. 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  Former patient of Dr. Sherryl Barters.  Prior knee right prosthesis was infected.  Does not routinely take her Lasix, makes her feel wiped out.  She does take spironolactone.  IBS type symptoms.  Still having knee pain.  Occasional palpitations when cleaning house for instance.  Short, fleeting.  No syncope.  No chest pain.  Past Medical History:  Diagnosis Date  . Allergic rhinitis, cause unspecified 04/29/2011  . Anemia   . Anemia, unspecified 04/23/2011  . Chronic anticoagulation   . Colon polyps may 2011  . Diverticulosis   . DVT (deep venous thrombosis) (HCC)    right upper extremitiy  . GERD (gastroesophageal reflux disease) 11/10/2011  . Gout 04/22/2010  . H/O: GI bleed   . Hyperlipidemia   . Hyperplastic colon polyp   . Hypertension   . IBS (irritable bowel syndrome)   . Infected prosthetic knee joint ([place])   . Migraine 04/29/2011  . Morbid obesity ([place])   . Nonsmoker   . Pulmonary embolism ([place]) 2010   following right knee replacement  . WPW (Wolff-Parkinson-White syndrome)     Past Surgical History:  Procedure Laterality Date  . CHOLECYSTECTOMY    . TOTAL KNEE ARTHROPLASTY Right 2010   followed by I & D for infection  . TUBAL LIGATION      Current Medications: Current Meds  Medication Sig  . Acetaminophen (TYLENOL PO) Take by mouth.  Marland Kitchen acetic acid-hydrocortisone (VOSOL-HC) OTIC solution Place 3 drops into the right ear 2 (two) times daily.  Marland Kitchen allopurinol (ZYLOPRIM) 300 MG tablet Take 1  tablet (300 mg total) by mouth daily.  Marland Kitchen atorvastatin (LIPITOR) 80 MG tablet Take 1 tablet (80 mg total) by mouth daily.  . carvedilol (COREG) 6.25 MG tablet Take 1 tablet (6.25 mg total) by mouth 2 (two) times daily.  . diclofenac Sodium (VOLTAREN) 1 % GEL Apply 4 g topically 4 (four) times daily.  Marland Kitchen HYDROcodone-acetaminophen (NORCO/VICODIN) 5-325 MG tablet Take 1 tablet by mouth every 6 (six) hours as needed.  Marland Kitchen losartan (COZAAR) 100 MG tablet Take 1 tablet (100 mg total) by mouth daily. Please keep upcoming appt in January 2022 before anymore refills. Thank you  . lubiprostone (AMITIZA) 24 MCG capsule Take 1 capsule (24 mcg total) by mouth 2 (two) times daily with a meal.  . potassium chloride SA (K-DUR,KLOR-CON) 20 MEQ tablet Take 1 tablet (20 mEq total) by mouth daily.  . rivaroxaban (XARELTO) 20 MG TABS tablet Take 1 tablet (20 mg total) by mouth daily with supper.  Marland Kitchen spironolactone (ALDACTONE) 25 MG tablet Take 1 tablet (25 mg total) by mouth daily. Overdue appt needed for further refills. 1st attempt  . tiZANidine (ZANAFLEX) 4 MG tablet TAKE 1 TABLET(4 MG) BY MOUTH AT BEDTIME  . Vitamin D, Ergocalciferol, (DRISDOL) 1.25 MG (50000 UNIT) CAPS capsule Take 1 capsule (50,000 Units total) by mouth every 7 (seven) days.     Allergies:   Crestor [rosuvastatin calcium], Amlodipine, Piperacillin sod-tazobactam so,  and Ultram [tramadol]   Social History   Socioeconomic History  . Marital status: Single    Spouse name: Not on file  . Number of children: Not on file  . Years of education: Not on file  . Highest education level: Not on file  Occupational History  . Not on file  Tobacco Use  . Smoking status: Never Smoker  . Smokeless tobacco: Never Used  Vaping Use  . Vaping Use: Never used  Substance and Sexual Activity  . Alcohol use: No  . Drug use: No  . Sexual activity: Not on file  Other Topics Concern  . Not on file  Social History Narrative  . Not on file   Social  Determinants of Health   Financial Resource Strain: Not on file  Food Insecurity: Not on file  Transportation Needs: Not on file  Physical Activity: Not on file  Stress: Not on file  Social Connections: Not on file     Family History: The patient's family history includes Colon cancer in an other family member; Coronary artery disease in an other family member; Heart Problems in her mother; Hypertension in her brother, sister, sister, and sister; Pancreatic cancer in her sister; Throat cancer in her father.  ROS:   Please see the history of present illness.     All other systems reviewed and are negative.  EKGs/Labs/Other Studies Reviewed:    The following studies were reviewed today:  Echo 2012: - Left ventricle: The cavity size was normal. There was mild   concentric hypertrophy. Systolic function was normal. The   estimated ejection fraction was in the range of 55% to 60%.  - Left atrium: The atrium was mildly dilated.  - Pulmonary arteries: Systolic pressure was mildly increased. PA   peak pressure: 49mm Hg (S).  Impressions:   - Extremely limited due to poor sound wave transmission; LV function   appears to be preserved; focal wall motion abnormality cannot be   excluded; right heart not well visualized; suggest TEE or MUGA if   clinically indicated.    EKG:  EKG is  ordered today.  The ekg ordered today demonstrates sinus rhythm 87 QRS duration 116 with subtle upsloping QRS.  PR interval 132 ms.  Recent Labs: 08/09/2019: ALT 7; BUN 17; Creat 0.76; Hemoglobin 13.7; Platelets 266; Potassium 4.1; Sodium 140; TSH 1.01  Recent Lipid Panel    Component Value Date/Time   CHOL 222 (H) 08/09/2019 1439   TRIG 111 08/09/2019 1439   HDL 46 (L) 08/09/2019 1439   CHOLHDL 4.8 08/09/2019 1439   VLDL 20.2 07/13/2018 1201   LDLCALC 153 (H) 08/09/2019 1439   LDLDIRECT 148.9 12/11/2012 0934      Physical Exam:    VS:  BP 120/82   Pulse 87   Ht 5' 3.5" (1.613  m)   Wt (!) 346 lb 12.8 oz (157.3 kg)   SpO2 96%   BMI 60.47 kg/m     Wt Readings from Last 3 Encounters:  01/10/20 (!) 346 lb 12.8 oz (157.3 kg)  08/09/19 (!) 344 lb (156 kg)  05/08/19 (!) 344 lb 2 oz (156.1 kg)     GEN:  Well nourished, well developed in no acute distress HEENT: Normal NECK: No JVD; No carotid bruits LYMPHATICS: No lymphadenopathy CARDIAC: RRR, no murmurs, rubs, gallops RESPIRATORY:  Clear to auscultation without rales, wheezing or rhonchi  ABDOMEN: Soft, non-tender, non-distended MUSCULOSKELETAL:  No edema; No deformity  SKIN: Warm and dry, varicose vein noted  pretibial region right leg. NEUROLOGIC:  Alert and oriented x 3 PSYCHIATRIC:  Normal affect   ASSESSMENT:    1. Wolff-Parkinson-White (WPW) syndrome   2. Morbid obesity, unspecified obesity type (HCC)   3. History of DVT (deep vein thrombosis)    PLAN:    In order of problems listed above:  WPW pattern on ECG intermittently, mild - Asymptomatic.  In the past have been avoiding beta-blockers.  This was diagnosed previously by Dr. Patty Sermons.  No changes made.  Hypertensive heart disease without heart failure - Overall reasonable control.  Take occasional furosemide 40 mg as needed.  Hyperlipidemia - LDL previously 204.  Currently on high intensity statin 80 mg atorvastatin. Latest LDL 153.  She is having lipid panel done in February.  History of DVT/PE - Xarelto, getting patient assistance.  Excellent.  Coumadin in the past was difficult to manage.  Severe constipation - Dr. Christella Hartigan, IBS, fiber, MiraLAX.  Apples have helped.  Changing her diet have helped significantly.  Morbid obesity - Continue to encourage weight loss. Nutritionist.  She states that she does not eat much at all.  Challenging.        Medication Adjustments/Labs and Tests Ordered: Current medicines are reviewed at length with the patient today.  Concerns regarding medicines are outlined above.  Orders Placed This  Encounter  Procedures  . EKG 12-Lead   No orders of the defined types were placed in this encounter.   Patient Instructions  Medication Instructions:  The current medical regimen is effective;  continue present plan and medications.  *If you need a refill on your cardiac medications before your next appointment, please call your pharmacy*  Follow-Up: At [facility], you and your health needs are our priority.  As part of our continuing mission to provide you with exceptional heart care, we have created designated Provider Care Teams.  These Care Teams include your primary Cardiologist (physician) and Advanced Practice Providers (APPs -  Physician Assistants and Nurse Practitioners) who all work together to provide you with the care you need, when you need it.  We recommend signing up for the patient portal called "MyChart".  Sign up information is provided on this After Visit Summary.  MyChart is used to connect with patients for Virtual Visits (Telemedicine).  Patients are able to view lab/test results, encounter notes, upcoming appointments, etc.  Non-urgent messages can be sent to your provider as well.   To learn more about what you can do with MyChart, go to [email or web address].    Your next appointment:   12 month(s)  The format for your next appointment:   In Person  Provider:   Donato Schultz, MD  Thank you for choosing [facility]!!        Signed, Donato Schultz, MD  01/10/2020 9:34 AM    [facility]

## 2020-02-05 ENCOUNTER — Other Ambulatory Visit: Payer: Self-pay

## 2020-02-05 ENCOUNTER — Other Ambulatory Visit: Payer: Self-pay | Admitting: Cardiology

## 2020-02-05 MED ORDER — ATORVASTATIN CALCIUM 80 MG PO TABS: 80.0000 mg | ORAL_TABLET | Freq: Every day | ORAL | 3 refills | Status: DC

## 2020-02-06 ENCOUNTER — Other Ambulatory Visit: Payer: Self-pay

## 2020-02-12 ENCOUNTER — Encounter: Payer: Self-pay | Admitting: Internal Medicine

## 2020-02-12 ENCOUNTER — Ambulatory Visit (INDEPENDENT_AMBULATORY_CARE_PROVIDER_SITE_OTHER): Payer: BC Managed Care – PPO | Admitting: Internal Medicine

## 2020-02-12 ENCOUNTER — Other Ambulatory Visit: Payer: Self-pay

## 2020-02-12 VITALS — BP 120/80 | HR 89 | Temp 98.2°F | Ht 63.5 in | Wt 343.0 lb

## 2020-02-12 DIAGNOSIS — H60501 Unspecified acute noninfective otitis externa, right ear: Secondary | ICD-10-CM

## 2020-02-12 DIAGNOSIS — M79672 Pain in left foot: Secondary | ICD-10-CM | POA: Diagnosis not present

## 2020-02-12 DIAGNOSIS — E785 Hyperlipidemia, unspecified: Secondary | ICD-10-CM | POA: Diagnosis not present

## 2020-02-12 DIAGNOSIS — L723 Sebaceous cyst: Secondary | ICD-10-CM

## 2020-02-12 DIAGNOSIS — I10 Essential (primary) hypertension: Secondary | ICD-10-CM

## 2020-02-12 DIAGNOSIS — L918 Other hypertrophic disorders of the skin: Secondary | ICD-10-CM

## 2020-02-12 DIAGNOSIS — R739 Hyperglycemia, unspecified: Secondary | ICD-10-CM

## 2020-02-12 DIAGNOSIS — E559 Vitamin D deficiency, unspecified: Secondary | ICD-10-CM | POA: Insufficient documentation

## 2020-02-12 DIAGNOSIS — H6091 Unspecified otitis externa, right ear: Secondary | ICD-10-CM | POA: Insufficient documentation

## 2020-02-12 LAB — LIPID PANEL
Cholesterol: 157 mg/dL (ref 0–200)
HDL: 34.6 mg/dL — ABNORMAL LOW (ref >39.00)
LDL Cholesterol: 94 mg/dL (ref 0–99)
NonHDL: 122.11
Total CHOL/HDL Ratio: 5
Triglycerides: 142 mg/dL (ref 0.0–149.0)
VLDL: 28.4 mg/dL (ref 0.0–40.0)

## 2020-02-12 LAB — BASIC METABOLIC PANEL
BUN: 23 mg/dL (ref 6–23)
CO2: 32 mEq/L (ref 19–32)
Calcium: 10.6 mg/dL — ABNORMAL HIGH (ref 8.4–10.5)
Chloride: 105 mEq/L (ref 96–112)
Creatinine, Ser: 1.27 mg/dL — ABNORMAL HIGH (ref 0.40–1.20)
GFR: 45.36 mL/min — ABNORMAL LOW (ref >60.00)
Glucose, Bld: 90 mg/dL (ref 70–99)
Potassium: 4.4 mEq/L (ref 3.5–5.1)
Sodium: 142 mEq/L (ref 135–145)

## 2020-02-12 LAB — HEPATIC FUNCTION PANEL
ALT: 8 U/L (ref 0–35)
AST: 11 U/L (ref 0–37)
Albumin: 3.6 g/dL (ref 3.5–5.2)
Alkaline Phosphatase: 66 U/L (ref 39–117)
Bilirubin, Direct: 0.2 mg/dL (ref 0.0–0.3)
Total Bilirubin: 1 mg/dL (ref 0.2–1.2)
Total Protein: 7 g/dL (ref 6.0–8.3)

## 2020-02-12 LAB — HEMOGLOBIN A1C: Hgb A1c MFr Bld: 6.7 % — ABNORMAL HIGH (ref 4.6–6.5)

## 2020-02-12 LAB — VITAMIN D 25 HYDROXY (VIT D DEFICIENCY, FRACTURES): VITD: 22.29 ng/mL — ABNORMAL LOW (ref 30.00–100.00)

## 2020-02-12 MED ORDER — NEOMYCIN-POLYMYXIN-HC 3.5-10000-1 OT SOLN: 3.0000 [drp] | Freq: Three times a day (TID) | OTIC | 1 refills | Status: DC

## 2020-02-12 MED ORDER — PREDNISONE 10 MG PO TABS: ORAL_TABLET | ORAL | 0 refills | Status: DC

## 2020-02-12 NOTE — Progress Notes (Signed)
Patient ID: Mia [name], female   DOB: [birth year], 65 y.o.   MRN: [id number]        Chief Complaint: right ear pa9in. Left heel pain, right axillary skin tag, left axilary cyst, hld and low vit d       HPI:  Mia [name] is a 65 y.o. female here with c/o right ear pain and feeling warm for 3 days, with mild HA, generalized weakness and nausea without ST, cough and Pt denies chest pain, increased sob or doe, wheezing, orthopnea, PND, increased LE swelling, palpitations, dizziness or syncope.  Tolerating the lipitor 80 well without myalgias.  Not taking Vit D.  Has a small mass to the left axilla for several months without pain , redness or drainage.  Also has a slight raised skin lesion to the right axilla that she is wondering if needs removed, has been present at least 6 mo.   Wt overall stable, less constipation recently with more fruit, just hard to be active due to chronic right knee pain, has been recommended for a 6th surgury but she does not want to do ti.  Also has worsening left knee djd, may need to ortho with cortison but hs not been b/c the speiaslist copays are too expensive.  Left heel as well pain getting worse again, asks for repeat prednisone, as is mod, intermittent, worse to walk with favoring the right knee, better to sit.   Plans to get covid booster soon, declines flu shot.  Due for pap and mammogram but has declined for 3 yrs due to cost.   Wt Readings from Last 3 Encounters:  02/12/20 (!) 343 lb (155.6 kg)  01/10/20 (!) 346 lb 12.8 oz (157.3 kg)  08/09/19 (!) 344 lb (156 kg)   BP Readings from Last 3 Encounters:  02/12/20 120/80  01/10/20 120/82  08/09/19 140/80         Past Medical History:  Diagnosis Date  . Allergic rhinitis, cause unspecified 04/29/2011  . Anemia   . Anemia, unspecified 04/23/2011  . Chronic anticoagulation   . Colon polyps may 2011  . Diverticulosis   . DVT (deep venous thrombosis) (HCC)    right upper extremitiy  . GERD  (gastroesophageal reflux disease) 11/10/2011  . Gout 04/22/2010  . H/O: GI bleed   . Hyperlipidemia   . Hyperplastic colon polyp   . Hypertension   . IBS (irritable bowel syndrome)   . Infected prosthetic knee joint (HCC)   . Migraine 04/29/2011  . Morbid obesity (HCC)   . Nonsmoker   . Pulmonary embolism (HCC) 2010   following right knee replacement  . WPW (Wolff-Parkinson-White syndrome)    Past Surgical History:  Procedure Laterality Date  . CHOLECYSTECTOMY    . TOTAL KNEE ARTHROPLASTY Right 2010   followed by I & D for infection  . TUBAL LIGATION      reports that she has never smoked. She has never used smokeless tobacco. She reports that she does not drink alcohol and does not use drugs. family history includes Colon cancer in an other family member; Coronary artery disease in an other family member; Heart Problems in her mother; Hypertension in her brother, sister, sister, and sister; Pancreatic cancer in her sister; Throat cancer in her father. Allergies  Allergen Reactions  . Crestor [Rosuvastatin Calcium] Other (See Comments)    Chest pain  . Amlodipine Other (See Comments)    Edema,ha  . Piperacillin Sod-Tazobactam So Other (See Comments)  REACTION: "UNSURE"  . Ultram [Tramadol] Nausea Only   Current Outpatient Medications on File Prior to Visit  Medication Sig Dispense Refill  . Acetaminophen (TYLENOL PO) Take by mouth.    Marland Kitchen acetic acid-hydrocortisone (VOSOL-HC) OTIC solution Place 3 drops into the right ear 2 (two) times daily.    Marland Kitchen atorvastatin (LIPITOR) 80 MG tablet Take 1 tablet (80 mg total) by mouth daily. 90 tablet 3  . carvedilol (COREG) 6.25 MG tablet Take 1 tablet (6.25 mg total) by mouth 2 (two) times daily. 180 tablet 3  . diclofenac Sodium (VOLTAREN) 1 % GEL Apply 4 g topically 4 (four) times daily. 50 g 0  . losartan (COZAAR) 100 MG tablet TAKE 1 TABLET BY MOUTH DAILY 90 tablet 3  . lubiprostone (AMITIZA) 24 MCG capsule Take 1 capsule (24 mcg total)  by mouth 2 (two) times daily with a meal. 60 capsule 3  . rivaroxaban (XARELTO) 20 MG TABS tablet Take 1 tablet (20 mg total) by mouth daily with supper. 30 tablet 11  . spironolactone (ALDACTONE) 25 MG tablet Take 1 tablet (25 mg total) by mouth daily. Overdue appt needed for further refills. 1st attempt 30 tablet 0  . tiZANidine (ZANAFLEX) 4 MG tablet TAKE 1 TABLET(4 MG) BY MOUTH AT BEDTIME 30 tablet 2  . allopurinol (ZYLOPRIM) 300 MG tablet Take 1 tablet (300 mg total) by mouth daily. (Patient not taking: Reported on 02/12/2020) 90 tablet 1  . furosemide (LASIX) 40 MG tablet Take 1.5 tablets (60 mg total) by mouth 2 (two) times daily. 135 tablet 3  . HYDROcodone-acetaminophen (NORCO/VICODIN) 5-325 MG tablet Take 1 tablet by mouth every 6 (six) hours as needed. (Patient not taking: Reported on 02/12/2020) 30 tablet 0  . potassium chloride SA (K-DUR,KLOR-CON) 20 MEQ tablet Take 1 tablet (20 mEq total) by mouth daily. 90 tablet 3  . Vitamin D, Ergocalciferol, (DRISDOL) 1.25 MG (50000 UNIT) CAPS capsule Take 1 capsule (50,000 Units total) by mouth every 7 (seven) days. (Patient not taking: Reported on 02/12/2020) 12 capsule 0   No current facility-administered medications on file prior to visit.        ROS:  All others reviewed and negative.  Objective        PE:  BP 120/80   Pulse 89   Temp 98.2 F (36.8 C) (Oral)   Ht 5' 3.5" (1.613 m)   Wt (!) 343 lb (155.6 kg)   SpO2 97%   BMI 59.81 kg/m                 Constitutional: Pt appears in NAD               HENT: Head: NCAT.                Right Ear: External ear normal.  Right ext canal with mild to mod erythema and slight bulging               Left Ear: External ear normal.                Eyes: . Pupils are equal, round, and reactive to light. Conjunctivae and EOM are normal               Nose: without d/c or deformity               Neck: Neck supple. Gross normal ROM               Cardiovascular: Normal rate and  regular rhythm.                  Pulmonary/Chest: Effort normal and breath sounds without rales or wheezing.                Abd:  Soft, NT, ND, + BS, no organomegaly; left heel with mild to mod tender, without swelling, redness or other skin change or ulcer               Neurological: Pt is alert. At baseline orientation, motor grossly intact               Skin:  LE edema - trace bilat, right axilla with small < 10 mm typical skin tag benign appearing; left axilla with < 1/2 cm subq cystic mass nontender without erythema or drinage               Psychiatric: Pt behavior is normal without agitation   Micro: none  Cardiac tracings I have personally interpreted today:  none  Pertinent Radiological findings (summarize): none   Lab Results  Component Value Date   WBC 9.8 08/09/2019   HGB 13.7 08/09/2019   HCT 42.2 08/09/2019   PLT 266 08/09/2019   GLUCOSE 90 02/12/2020   CHOL 157 02/12/2020   TRIG 142.0 02/12/2020   HDL 34.60 (L) 02/12/2020   LDLDIRECT 148.9 12/11/2012   LDLCALC 94 02/12/2020   ALT 8 02/12/2020   AST 11 02/12/2020   NA 142 02/12/2020   K 4.4 02/12/2020   CL 105 02/12/2020   CREATININE 1.27 (H) 02/12/2020   BUN 23 02/12/2020   CO2 32 02/12/2020   TSH 1.01 08/09/2019   INR 2.8 03/28/2018   HGBA1C 6.7 (H) 02/12/2020   Assessment/Plan:  Mia [name] is a 65 y.o. Black or African American [2] female with  has a past medical history of Allergic rhinitis, cause unspecified (04/29/2011), Anemia, Anemia, unspecified (04/23/2011), Chronic anticoagulation, Colon polyps (may 2011), Diverticulosis, DVT (deep venous thrombosis) (HCC), GERD (gastroesophageal reflux disease) (11/10/2011), Gout (04/22/2010), H/O: GI bleed, Hyperlipidemia, Hyperplastic colon polyp, Hypertension, IBS (irritable bowel syndrome), Infected prosthetic knee joint (HCC), Migraine (04/29/2011), Morbid obesity (HCC), Nonsmoker, Pulmonary embolism (HCC) (2010), and WPW (Wolff-Parkinson-White syndrome).  External otitis of right ear Mild  to mod, for antibx course,  to f/u any worsening symptoms or concerns  Pain of left heel C/w plantar fasciitis, d/w pt - for predpac asd,declines sport med referral for now  Dyslipidemia Lab Results  Component Value Date   LDLCALC 94 02/12/2020   Stable, pt to continue current statin lipitor  Current Outpatient Medications (Endocrine & Metabolic):  .  predniSONE (DELTASONE) 10 MG tablet, 3 tabs by mouth per day for 3 days,2tabs per day for 3 days,1tab per day for 3 days  Current Outpatient Medications (Cardiovascular):  .  atorvastatin (LIPITOR) 80 MG tablet, Take 1 tablet (80 mg total) by mouth daily. .  carvedilol (COREG) 6.25 MG tablet, Take 1 tablet (6.25 mg total) by mouth 2 (two) times daily. Marland Kitchen  losartan (COZAAR) 100 MG tablet, TAKE 1 TABLET BY MOUTH DAILY .  spironolactone (ALDACTONE) 25 MG tablet, Take 1 tablet (25 mg total) by mouth daily. Overdue appt needed for further refills. 1st attempt .  furosemide (LASIX) 40 MG tablet, Take 1.5 tablets (60 mg total) by mouth 2 (two) times daily.   Current Outpatient Medications (Analgesics):  Marland Kitchen  Acetaminophen (TYLENOL PO), Take by mouth. Marland Kitchen  allopurinol (ZYLOPRIM) 300 MG tablet, Take 1 tablet (300  mg total) by mouth daily. (Patient not taking: Reported on 02/12/2020) .  HYDROcodone-acetaminophen (NORCO/VICODIN) 5-325 MG tablet, Take 1 tablet by mouth every 6 (six) hours as needed. (Patient not taking: Reported on 02/12/2020)  Current Outpatient Medications (Hematological):  .  rivaroxaban (XARELTO) 20 MG TABS tablet, Take 1 tablet (20 mg total) by mouth daily with supper.  Current Outpatient Medications (Other):  .  acetic acid-hydrocortisone (VOSOL-HC) OTIC solution, Place 3 drops into the right ear 2 (two) times daily. .  diclofenac Sodium (VOLTAREN) 1 % GEL, Apply 4 g topically 4 (four) times daily. Marland Kitchen  lubiprostone (AMITIZA) 24 MCG capsule, Take 1 capsule (24 mcg total) by mouth 2 (two) times daily with a meal. .   neomycin-polymyxin-hydrocortisone (CORTISPORIN) OTIC solution, Place 3 drops into the right ear 3 (three) times daily. Marland Kitchen  tiZANidine (ZANAFLEX) 4 MG tablet, TAKE 1 TABLET(4 MG) BY MOUTH AT BEDTIME .  potassium chloride SA (K-DUR,KLOR-CON) 20 MEQ tablet, Take 1 tablet (20 mEq total) by mouth daily. .  Vitamin D, Ergocalciferol, (DRISDOL) 1.25 MG (50000 UNIT) CAPS capsule, Take 1 capsule (50,000 Units total) by mouth every 7 (seven) days. (Patient not taking: Reported on 02/12/2020)   HTN (hypertension) BP Readings from Last 3 Encounters:  02/12/20 120/80  01/10/20 120/82  08/09/19 140/80   Stable, pt to continue medical treatment coreg, cozaar, aldactone   Hyperglycemia Lab Results  Component Value Date   HGBA1C 6.7 (H) 02/12/2020   Stable, pt to continue current medical treatment   - diet   Vitamin D deficiency Last vitamin D Lab Results  Component Value Date   VD25OH 22.29 (L) 02/12/2020   Low, to start vit d3 oral replacement 2000 u qd   Skin tag Right axilkary small , benign, no specific tx needed  Sebaceous cyst Left axilla, small benign, non infected, ok to follow, declines surgical referral  Followup: Return in about 6 months (around 08/11/2020).  Oliver Barre, MD 02/21/2020 8:30 PM [facility]

## 2020-02-12 NOTE — Patient Instructions (Signed)
Please take all new medication as prescribed - the ear drops, and the prednisone  Please continue all other medications as before, and refills have been done if requested.  Please have the pharmacy call with any other refills you may need.  Please continue your efforts at being more active, low cholesterol diet, and weight control.  Please keep your appointments with your specialists as you may have planned  Please go to the LAB at the blood drawing area for the tests to be done  You will be contacted by phone if any changes need to be made immediately.  Otherwise, you will receive a letter about your results with an explanation, but please check with MyChart first.  Please remember to sign up for MyChart if you have not done so, as this will be important to you in the future with finding out test results, communicating by private email, and scheduling acute appointments online when needed.  Please make an Appointment to return in 6 months, or sooner if needed

## 2020-02-12 NOTE — Progress Notes (Signed)
3

## 2020-02-16 ENCOUNTER — Other Ambulatory Visit: Payer: Self-pay | Admitting: Internal Medicine

## 2020-02-16 ENCOUNTER — Encounter: Payer: Self-pay | Admitting: Internal Medicine

## 2020-02-16 DIAGNOSIS — E1165 Type 2 diabetes mellitus with hyperglycemia: Secondary | ICD-10-CM

## 2020-02-21 ENCOUNTER — Encounter: Payer: Self-pay | Admitting: Internal Medicine

## 2020-02-21 DIAGNOSIS — L723 Sebaceous cyst: Secondary | ICD-10-CM | POA: Insufficient documentation

## 2020-02-21 DIAGNOSIS — L918 Other hypertrophic disorders of the skin: Secondary | ICD-10-CM | POA: Insufficient documentation

## 2020-02-21 NOTE — Assessment & Plan Note (Signed)
BP Readings from Last 3 Encounters:  02/12/20 120/80  01/10/20 120/82  08/09/19 140/80   Stable, pt to continue medical treatment coreg, cozaar, aldactone

## 2020-02-21 NOTE — Assessment & Plan Note (Signed)
Left axilla, small benign, non infected, ok to follow, declines surgical referral

## 2020-02-21 NOTE — Assessment & Plan Note (Signed)
C/w plantar fasciitis, d/w pt - for predpac asd,declines sport med referral for now

## 2020-02-21 NOTE — Assessment & Plan Note (Signed)
Lab Results  Component Value Date   HGBA1C 6.7 (H) 02/12/2020   Stable, pt to continue current medical treatment   - diet

## 2020-02-21 NOTE — Assessment & Plan Note (Signed)
Right axilkary small , benign, no specific tx needed

## 2020-02-21 NOTE — Assessment & Plan Note (Signed)
Lab Results  Component Value Date   LDLCALC 94 02/12/2020   Stable, pt to continue current statin lipitor  Current Outpatient Medications (Endocrine & Metabolic):  .  predniSONE (DELTASONE) 10 MG tablet, 3 tabs by mouth per day for 3 days,2tabs per day for 3 days,1tab per day for 3 days  Current Outpatient Medications (Cardiovascular):  .  atorvastatin (LIPITOR) 80 MG tablet, Take 1 tablet (80 mg total) by mouth daily. .  carvedilol (COREG) 6.25 MG tablet, Take 1 tablet (6.25 mg total) by mouth 2 (two) times daily. Marland Kitchen  losartan (COZAAR) 100 MG tablet, TAKE 1 TABLET BY MOUTH DAILY .  spironolactone (ALDACTONE) 25 MG tablet, Take 1 tablet (25 mg total) by mouth daily. Overdue appt needed for further refills. 1st attempt .  furosemide (LASIX) 40 MG tablet, Take 1.5 tablets (60 mg total) by mouth 2 (two) times daily.   Current Outpatient Medications (Analgesics):  Marland Kitchen  Acetaminophen (TYLENOL PO), Take by mouth. Marland Kitchen  allopurinol (ZYLOPRIM) 300 MG tablet, Take 1 tablet (300 mg total) by mouth daily. (Patient not taking: Reported on 02/12/2020) .  HYDROcodone-acetaminophen (NORCO/VICODIN) 5-325 MG tablet, Take 1 tablet by mouth every 6 (six) hours as needed. (Patient not taking: Reported on 02/12/2020)  Current Outpatient Medications (Hematological):  .  rivaroxaban (XARELTO) 20 MG TABS tablet, Take 1 tablet (20 mg total) by mouth daily with supper.  Current Outpatient Medications (Other):  .  acetic acid-hydrocortisone (VOSOL-HC) OTIC solution, Place 3 drops into the right ear 2 (two) times daily. .  diclofenac Sodium (VOLTAREN) 1 % GEL, Apply 4 g topically 4 (four) times daily. Marland Kitchen  lubiprostone (AMITIZA) 24 MCG capsule, Take 1 capsule (24 mcg total) by mouth 2 (two) times daily with a meal. .  neomycin-polymyxin-hydrocortisone (CORTISPORIN) OTIC solution, Place 3 drops into the right ear 3 (three) times daily. Marland Kitchen  tiZANidine (ZANAFLEX) 4 MG tablet, TAKE 1 TABLET(4 MG) BY MOUTH AT BEDTIME .  potassium  chloride SA (K-DUR,KLOR-CON) 20 MEQ tablet, Take 1 tablet (20 mEq total) by mouth daily. .  Vitamin D, Ergocalciferol, (DRISDOL) 1.25 MG (50000 UNIT) CAPS capsule, Take 1 capsule (50,000 Units total) by mouth every 7 (seven) days. (Patient not taking: Reported on 02/12/2020)

## 2020-02-21 NOTE — Assessment & Plan Note (Signed)
Last vitamin D Lab Results  Component Value Date   VD25OH 22.29 (L) 02/12/2020   Low, to start vit d3 oral replacement 2000 u qd

## 2020-02-21 NOTE — Assessment & Plan Note (Signed)
Mild to mod, for antibx course,  to f/u any worsening symptoms or concerns 

## 2020-03-06 ENCOUNTER — Other Ambulatory Visit: Payer: Self-pay | Admitting: Cardiology

## 2020-03-06 DIAGNOSIS — Z5181 Encounter for therapeutic drug level monitoring: Secondary | ICD-10-CM

## 2020-03-06 DIAGNOSIS — I5032 Chronic diastolic (congestive) heart failure: Secondary | ICD-10-CM

## 2020-03-06 DIAGNOSIS — I456 Pre-excitation syndrome: Secondary | ICD-10-CM

## 2020-03-06 DIAGNOSIS — I10 Essential (primary) hypertension: Secondary | ICD-10-CM

## 2020-03-06 NOTE — Telephone Encounter (Signed)
Xarelto 20mg  refill request received. Pt is 65 years old, weight-155.6kg, Crea-1.27 on 02/12/20, last seen by Dr. 04/11/20 on 01/10/2020, Diagnosis-DVT & PE, CrCl-112/.57ml/min; Dose is appropriate based on dosing criteria. Will send in refill to requested pharmacy.

## 2020-04-03 ENCOUNTER — Other Ambulatory Visit: Payer: Self-pay | Admitting: Cardiology

## 2020-04-03 DIAGNOSIS — I5032 Chronic diastolic (congestive) heart failure: Secondary | ICD-10-CM

## 2020-04-03 DIAGNOSIS — I456 Pre-excitation syndrome: Secondary | ICD-10-CM

## 2020-04-03 DIAGNOSIS — Z5181 Encounter for therapeutic drug level monitoring: Secondary | ICD-10-CM

## 2020-04-03 DIAGNOSIS — I10 Essential (primary) hypertension: Secondary | ICD-10-CM

## 2020-04-08 ENCOUNTER — Telehealth: Payer: Self-pay

## 2020-04-08 NOTE — Telephone Encounter (Signed)
**Note De-Identified Demarkus Remmel Obfuscation** I started a Xarelto PA through covermymeds. Key: [id number]

## 2020-04-08 NOTE — Telephone Encounter (Signed)
**Note De-Identified Connor Foxworthy Obfuscation** Following message received from covermymedsLAKEVA HOLLON Key: [id number] Outcome: Your PA has been resolved, no additional PA is required.  Drug: Xarelto 20MG  tablets Form: PA Form [phone] NCPDP)  I called [facility] to make them aware of this outcome but was on hold with 2 callers ahead of me for more than 5 mins so I hung up and then called back and left a message on the pharmacy VM advising that the pts Xarelto PA has been resolved per [employer]. I did ask that they call [phone] at Dr Larita Fife office at [phone] if they continue to have issues with ins coverage of Xarelto.

## 2020-06-10 ENCOUNTER — Telehealth: Payer: Self-pay | Admitting: Cardiology

## 2020-06-10 NOTE — Telephone Encounter (Signed)
   Patient calling the office for samples of medication:   1.  What medication and dosage are you requesting samples for?  XARELTO 20 MG TABS tablet    2.  Are you currently out of this medication? Yes  Been out of meds for 3 days, her workers comp still not able to give her prescription

## 2020-06-10 NOTE — Telephone Encounter (Signed)
Spoke with pt whose workman's comp is supposed to be refilling her Xarelto. They have only been refilling it month by month.  This month, they have not refilled it and she has not been able to contact the adjuster to be able to obtain the prescription. Samples [id number] X 4- exp 4/24 and a co-pay card left at front desk for pt to kick up.  She is aware and grateful for the assistance.

## 2020-08-11 ENCOUNTER — Encounter: Payer: Self-pay | Admitting: Internal Medicine

## 2020-08-11 ENCOUNTER — Ambulatory Visit: Payer: BC Managed Care – PPO | Admitting: Internal Medicine

## 2020-08-11 ENCOUNTER — Other Ambulatory Visit: Payer: Self-pay

## 2020-08-11 VITALS — BP 122/78 | HR 72 | Temp 98.4°F | Ht 63.5 in | Wt 343.0 lb

## 2020-08-11 DIAGNOSIS — Z0000 Encounter for general adult medical examination without abnormal findings: Secondary | ICD-10-CM

## 2020-08-11 DIAGNOSIS — I10 Essential (primary) hypertension: Secondary | ICD-10-CM

## 2020-08-11 DIAGNOSIS — K219 Gastro-esophageal reflux disease without esophagitis: Secondary | ICD-10-CM

## 2020-08-11 DIAGNOSIS — Z1231 Encounter for screening mammogram for malignant neoplasm of breast: Secondary | ICD-10-CM

## 2020-08-11 DIAGNOSIS — E559 Vitamin D deficiency, unspecified: Secondary | ICD-10-CM

## 2020-08-11 DIAGNOSIS — Z1211 Encounter for screening for malignant neoplasm of colon: Secondary | ICD-10-CM

## 2020-08-11 DIAGNOSIS — R739 Hyperglycemia, unspecified: Secondary | ICD-10-CM | POA: Diagnosis not present

## 2020-08-11 DIAGNOSIS — E785 Hyperlipidemia, unspecified: Secondary | ICD-10-CM | POA: Diagnosis not present

## 2020-08-11 DIAGNOSIS — E538 Deficiency of other specified B group vitamins: Secondary | ICD-10-CM | POA: Diagnosis not present

## 2020-08-11 DIAGNOSIS — Z0001 Encounter for general adult medical examination with abnormal findings: Secondary | ICD-10-CM

## 2020-08-11 LAB — URINALYSIS, ROUTINE W REFLEX MICROSCOPIC
Bilirubin Urine: NEGATIVE
Hgb urine dipstick: NEGATIVE
Ketones, ur: NEGATIVE
Leukocytes,Ua: NEGATIVE
Nitrite: NEGATIVE
RBC / HPF: NONE SEEN (ref 0–?)
Specific Gravity, Urine: 1.02 (ref 1.000–1.030)
Total Protein, Urine: NEGATIVE
Urine Glucose: NEGATIVE
Urobilinogen, UA: 0.2 (ref 0.0–1.0)
pH: 6.5 (ref 5.0–8.0)

## 2020-08-11 LAB — CBC WITH DIFFERENTIAL/PLATELET
Basophils Absolute: 0.1 10*3/uL (ref 0.0–0.1)
Basophils Relative: 0.9 % (ref 0.0–3.0)
Eosinophils Absolute: 0.1 10*3/uL (ref 0.0–0.7)
Eosinophils Relative: 1.1 % (ref 0.0–5.0)
HCT: 41.8 % (ref 36.0–46.0)
Hemoglobin: 13.4 g/dL (ref 12.0–15.0)
Lymphocytes Relative: 32.3 % (ref 12.0–46.0)
Lymphs Abs: 2.3 10*3/uL (ref 0.7–4.0)
MCHC: 32.1 g/dL (ref 30.0–36.0)
MCV: 85.5 fl (ref 78.0–100.0)
Monocytes Absolute: 0.6 10*3/uL (ref 0.1–1.0)
Monocytes Relative: 8.6 % (ref 3.0–12.0)
Neutro Abs: 4 10*3/uL (ref 1.4–7.7)
Neutrophils Relative %: 57.1 % (ref 43.0–77.0)
Platelets: 199 10*3/uL (ref 150.0–400.0)
RBC: 4.89 Mil/uL (ref 3.87–5.11)
RDW: 14.9 % (ref 11.5–15.5)
WBC: 7 10*3/uL (ref 4.0–10.5)

## 2020-08-11 LAB — LIPID PANEL
Cholesterol: 158 mg/dL (ref 0–200)
HDL: 40.1 mg/dL (ref >39.00)
LDL Cholesterol: 99 mg/dL (ref 0–99)
NonHDL: 117.63
Total CHOL/HDL Ratio: 4
Triglycerides: 93 mg/dL (ref 0.0–149.0)
VLDL: 18.6 mg/dL (ref 0.0–40.0)

## 2020-08-11 LAB — HEPATIC FUNCTION PANEL
ALT: 10 U/L (ref 0–35)
AST: 12 U/L (ref 0–37)
Albumin: 3.7 g/dL (ref 3.5–5.2)
Alkaline Phosphatase: 74 U/L (ref 39–117)
Bilirubin, Direct: 0.1 mg/dL (ref 0.0–0.3)
Total Bilirubin: 0.7 mg/dL (ref 0.2–1.2)
Total Protein: 7.1 g/dL (ref 6.0–8.3)

## 2020-08-11 LAB — BASIC METABOLIC PANEL
BUN: 19 mg/dL (ref 6–23)
CO2: 29 mEq/L (ref 19–32)
Calcium: 10.2 mg/dL (ref 8.4–10.5)
Chloride: 105 mEq/L (ref 96–112)
Creatinine, Ser: 0.79 mg/dL (ref 0.40–1.20)
GFR: 79.9 mL/min (ref >60.00)
Glucose, Bld: 94 mg/dL (ref 70–99)
Potassium: 4.2 mEq/L (ref 3.5–5.1)
Sodium: 141 mEq/L (ref 135–145)

## 2020-08-11 LAB — VITAMIN D 25 HYDROXY (VIT D DEFICIENCY, FRACTURES): VITD: 24.63 ng/mL — ABNORMAL LOW (ref 30.00–100.00)

## 2020-08-11 LAB — MICROALBUMIN / CREATININE URINE RATIO
Creatinine,U: 93.6 mg/dL
Microalb Creat Ratio: 0.7 mg/g (ref 0.0–30.0)
Microalb, Ur: <0.7 mg/dL (ref 0.0–1.9)

## 2020-08-11 LAB — VITAMIN B12: Vitamin B-12: 288 pg/mL (ref 211–911)

## 2020-08-11 LAB — HEMOGLOBIN A1C: Hgb A1c MFr Bld: 6.5 % (ref 4.6–6.5)

## 2020-08-11 LAB — TSH: TSH: 1.78 u[IU]/mL (ref 0.35–5.50)

## 2020-08-11 MED ORDER — ATORVASTATIN CALCIUM 80 MG PO TABS: 80.0000 mg | ORAL_TABLET | Freq: Every day | ORAL | 3 refills | Status: DC

## 2020-08-11 MED ORDER — OZEMPIC (0.25 OR 0.5 MG/DOSE) 2 MG/1.5ML ~~LOC~~ SOPN: 0.5000 mg | PEN_INJECTOR | SUBCUTANEOUS | 3 refills | Status: DC

## 2020-08-11 NOTE — Assessment & Plan Note (Signed)
Age and sex appropriate education and counseling updated with regular exercise and diet Referrals for preventative services - pt to call for gyN, also refer for mammogram and colonoscopy Immunizations addressed - decliens covid, flu, shingrix, pneumovax Smoking counseling  - none needed Evidence for depression or other mood disorder - none significant Most recent labs reviewed. I have personally reviewed and have noted: 1) the patient's medical and social history 2) The patient's current medications and supplements 3) The patient's height, weight, and BMI have been recorded in the chart

## 2020-08-11 NOTE — Assessment & Plan Note (Signed)
Last vitamin D Lab Results  Component Value Date   VD25OH 22.29 (L) 02/12/2020   Low, to start oral replacement

## 2020-08-11 NOTE — Assessment & Plan Note (Addendum)
Lab Results  Component Value Date   HGBA1C 6.5 08/11/2020   Borderline elevated, with obesity, pt to start ozempic

## 2020-08-11 NOTE — Patient Instructions (Addendum)
Please check online for drug cost savings at [employer]  You will be contacted regarding the referral for: colonoscopy, and mammogram  Please take all new medication as prescribed - the ozempic shot once per wk  Please continue all other medications as before, including to restart the lipitor  Please have the pharmacy call with any other refills you may need.  Please continue your efforts at being more active, low cholesterol diet, and weight control.  You are otherwise up to date with prevention measures today.  Please keep your appointments with your specialists as you may have planned  Please go to the LAB at the blood drawing area for the tests to be done  You will be contacted by phone if any changes need to be made immediately.  Otherwise, you will receive a letter about your results with an explanation, but please check with MyChart first.  Please remember to sign up for MyChart if you have not done so, as this will be important to you in the future with finding out test results, communicating by private email, and scheduling acute appointments online when needed.  Please make an Appointment to return in 6 months, or sooner if needed

## 2020-08-11 NOTE — Assessment & Plan Note (Signed)
BP Readings from Last 3 Encounters:  08/11/20 122/78  02/12/20 120/80  01/10/20 120/82   Stable, pt to continue medical treatment losartan, coreg

## 2020-08-11 NOTE — Progress Notes (Signed)
Patient ID: Mia [name], female   DOB: 01-11-1955, 65 y.o.   MRN: [id number]         Chief Complaint:: wellness exam and dm, obesity, hld       HPI:  Mia [name] is a 65 y.o. female here for wellness exam; due for mammogram and colonoscopy, o/w plans to f/u with GYN for pap, declnes covid booster, flu, shirngrx, pneumovax, o/w up to date with preventive referrals and immunizations                        Also out of lipitor x 5 days, due to money out due to rent increase.  No recent gout. Pt denies chest pain, increased sob or doe, wheezing, orthopnea, PND, increased LE swelling, palpitations, dizziness or syncope.  Pt denies polydipsia, polyuria, or new focal neuro s/s.   Pt denies fever, wt loss, night sweats, loss of appetite, or other constitutional symptoms  No other new complaints  not taking Vit D,  Denies worsening reflux, abd pain, dysphagia, n/v, bowel change or blood.   Wt Readings from Last 3 Encounters:  08/11/20 (!) 343 lb (155.6 kg)  02/12/20 (!) 343 lb (155.6 kg)  01/10/20 (!) 346 lb 12.8 oz (157.3 kg)   BP Readings from Last 3 Encounters:  08/11/20 122/78  02/12/20 120/80  01/10/20 120/82   Immunization History  Administered Date(s) Administered   PFIZER Comirnaty(Gray Top)Covid-19 Tri-Sucrose Vaccine 04/06/2019, 04/28/2019   Tdap 05/22/2014   Health Maintenance Due  Topic Date Due   MAMMOGRAM  05/04/2017   COLONOSCOPY (Pts 45-5yrs Insurance coverage will need to be confirmed)  05/06/2019      Past Medical History:  Diagnosis Date   Allergic rhinitis, cause unspecified 04/29/2011   Anemia    Anemia, unspecified 04/23/2011   Chronic anticoagulation    Colon polyps may 2011   Diverticulosis    DVT (deep venous thrombosis) (HCC)    right upper extremitiy   GERD (gastroesophageal reflux disease) 11/10/2011   Gout 04/22/2010   H/O: GI bleed    Hyperlipidemia    Hyperplastic colon polyp    Hypertension    IBS (irritable bowel syndrome)    Infected  prosthetic knee joint (HCC)    Migraine 04/29/2011   Morbid obesity (HCC)    Nonsmoker    Pulmonary embolism (HCC) 2010   following right knee replacement   WPW (Wolff-Parkinson-White syndrome)    Past Surgical History:  Procedure Laterality Date   CHOLECYSTECTOMY     TOTAL KNEE ARTHROPLASTY Right 2010   followed by [facility] for infection   TUBAL LIGATION      reports that she has never smoked. She has never used smokeless tobacco. She reports that she does not drink alcohol and does not use drugs. family history includes Colon cancer in an other family member; Coronary artery disease in an other family member; Heart Problems in her mother; Hypertension in her brother, sister, sister, and sister; Pancreatic cancer in her sister; Throat cancer in her father. Allergies  Allergen Reactions   Crestor [Rosuvastatin Calcium] Other (See Comments)    Chest pain   Amlodipine Other (See Comments)    Edema,ha   Piperacillin Sod-Tazobactam So Other (See Comments)    REACTION: "UNSURE"   Ultram [Tramadol] Nausea Only   Current Outpatient Medications on File Prior to Visit  Medication Sig Dispense Refill   Acetaminophen (TYLENOL PO) Take by mouth.     acetic acid-hydrocortisone (VOSOL-HC)  OTIC solution Place 3 drops into the right ear 2 (two) times daily.     allopurinol (ZYLOPRIM) 300 MG tablet Take 1 tablet (300 mg total) by mouth daily. 90 tablet 1   carvedilol (COREG) 6.25 MG tablet TAKE 1 TABLET(6.25 MG) BY MOUTH TWICE DAILY 180 tablet 2   diclofenac Sodium (VOLTAREN) 1 % GEL Apply 4 g topically 4 (four) times daily. 50 g 0   HYDROcodone-acetaminophen (NORCO/VICODIN) 5-325 MG tablet Take 1 tablet by mouth every 6 (six) hours as needed. 30 tablet 0   losartan (COZAAR) 100 MG tablet TAKE 1 TABLET BY MOUTH DAILY 90 tablet 3   lubiprostone (AMITIZA) 24 MCG capsule Take 1 capsule (24 mcg total) by mouth 2 (two) times daily with a meal. 60 capsule 3   neomycin-polymyxin-hydrocortisone (CORTISPORIN)  OTIC solution Place 3 drops into the right ear 3 (three) times daily. 10 mL 1   potassium chloride SA (K-DUR,KLOR-CON) 20 MEQ tablet Take 1 tablet (20 mEq total) by mouth daily. 90 tablet 3   spironolactone (ALDACTONE) 25 MG tablet Take 1 tablet (25 mg total) by mouth daily. 90 tablet 3   tiZANidine (ZANAFLEX) 4 MG tablet TAKE 1 TABLET(4 MG) BY MOUTH AT BEDTIME 30 tablet 2   XARELTO 20 MG TABS tablet TAKE 1 TABLET(20 MG) BY MOUTH DAILY WITH SUPPER 30 tablet 11   furosemide (LASIX) 40 MG tablet Take 1.5 tablets (60 mg total) by mouth 2 (two) times daily. 135 tablet 3   predniSONE (DELTASONE) 10 MG tablet 3 tabs by mouth per day for 3 days,2tabs per day for 3 days,1tab per day for 3 days (Patient not taking: Reported on 08/11/2020) 18 tablet 0   Vitamin D, Ergocalciferol, (DRISDOL) 1.25 MG (50000 UNIT) CAPS capsule Take 1 capsule (50,000 Units total) by mouth every 7 (seven) days. (Patient not taking: No sig reported) 12 capsule 0   No current facility-administered medications on file prior to visit.        ROS:  All others reviewed and negative.  Objective        PE:  BP 122/78 (BP Location: Right Arm, Patient Position: Sitting, Cuff Size: Large)   Pulse 72   Temp 98.4 F (36.9 C) (Oral)   Ht 5' 3.5" (1.613 m)   Wt (!) 343 lb (155.6 kg)   SpO2 98%   BMI 59.81 kg/m                 Constitutional: Pt appears in NAD               HENT: Head: NCAT.                Right Ear: External ear normal.                 Left Ear: External ear normal.                Eyes: . Pupils are equal, round, and reactive to light. Conjunctivae and EOM are normal               Nose: without d/c or deformity               Neck: Neck supple. Gross normal ROM               Cardiovascular: Normal rate and regular rhythm.                 Pulmonary/Chest: Effort normal and breath sounds without rales or wheezing.  Abd:  Soft, NT, ND, + BS, no organomegaly               Neurological: Pt is alert. At  baseline orientation, motor grossly intact               Skin: Skin is warm. No rashes, no other new lesions, LE edema - none               Psychiatric: Pt behavior is normal without agitation   Micro: none  Cardiac tracings I have personally interpreted today:  none  Pertinent Radiological findings (summarize): none   Lab Results  Component Value Date   WBC 7.0 08/11/2020   HGB 13.4 08/11/2020   HCT 41.8 08/11/2020   PLT 199.0 08/11/2020   GLUCOSE 94 08/11/2020   CHOL 158 08/11/2020   TRIG 93.0 08/11/2020   HDL 40.10 08/11/2020   LDLDIRECT 148.9 12/11/2012   LDLCALC 99 08/11/2020   ALT 10 08/11/2020   AST 12 08/11/2020   NA 141 08/11/2020   K 4.2 08/11/2020   CL 105 08/11/2020   CREATININE 0.79 08/11/2020   BUN 19 08/11/2020   CO2 29 08/11/2020   TSH 1.78 08/11/2020   INR 2.8 03/28/2018   HGBA1C 6.5 08/11/2020   MICROALBUR <0.7 08/11/2020   Assessment/Plan:  Mia [name] is a 66 y.o. Black or African American [2] female with  has a past medical history of Allergic rhinitis, cause unspecified (04/29/2011), Anemia, Anemia, unspecified (04/23/2011), Chronic anticoagulation, Colon polyps (may 2011), Diverticulosis, DVT (deep venous thrombosis) (HCC), GERD (gastroesophageal reflux disease) (11/10/2011), Gout (04/22/2010), H/O: GI bleed, Hyperlipidemia, Hyperplastic colon polyp, Hypertension, IBS (irritable bowel syndrome), Infected prosthetic knee joint (HCC), Migraine (04/29/2011), Morbid obesity (HCC), Nonsmoker, Pulmonary embolism (HCC) (2010), and WPW (Wolff-Parkinson-White syndrome).  Vitamin D deficiency Last vitamin D Lab Results  Component Value Date   VD25OH 22.29 (L) 02/12/2020   Low, to start oral replacement   Encounter for well adult exam with abnormal findings Age and sex appropriate education and counseling updated with regular exercise and diet Referrals for preventative services - pt to call for gyN, also refer for mammogram and  colonoscopy Immunizations addressed - decliens covid, flu, shingrix, pneumovax Smoking counseling  - none needed Evidence for depression or other mood disorder - none significant Most recent labs reviewed. I have personally reviewed and have noted: 1) the patient's medical and social history 2) The patient's current medications and supplements 3) The patient's height, weight, and BMI have been recorded in the chart   Hyperglycemia Lab Results  Component Value Date   HGBA1C 6.5 08/11/2020   Borderline elevated, with obesity, pt to start ozempic  HTN (hypertension) BP Readings from Last 3 Encounters:  08/11/20 122/78  02/12/20 120/80  01/10/20 120/82   Stable, pt to continue medical treatment losartan, coreg   GERD (gastroesophageal reflux disease) Stable overall, to continue tums prn  Dyslipidemia Pt to restart lipitor, goal ldl < 70 Lab Results  Component Value Date   CHOL 158 08/11/2020   HDL 40.10 08/11/2020   LDLCALC 99 08/11/2020   LDLDIRECT 148.9 12/11/2012   TRIG 93.0 08/11/2020   CHOLHDL 4 08/11/2020   Followup: No follow-ups on file.  Oliver Barre, MD 08/11/2020 11:00 PM [facility]

## 2020-08-11 NOTE — Assessment & Plan Note (Signed)
Pt to restart lipitor, goal ldl < 70 Lab Results  Component Value Date   CHOL 158 08/11/2020   HDL 40.10 08/11/2020   LDLCALC 99 08/11/2020   LDLDIRECT 148.9 12/11/2012   TRIG 93.0 08/11/2020   CHOLHDL 4 08/11/2020

## 2020-08-11 NOTE — Assessment & Plan Note (Signed)
Stable overall, to continue tums prn

## 2020-08-13 ENCOUNTER — Telehealth: Payer: Self-pay

## 2020-08-13 NOTE — Telephone Encounter (Signed)
**Note De-Identified Chrisopher Pustejovsky Obfuscation** I received a Xarelto PA request from [employer].  I called [facility] as we have received this fax in the past but a Xarelto PA has not been required. I was on hold for more than 5 mins so I hung up and called them back but was on hold for more than 7 mins. So I hung up for the second time and called them back and left a message on the pharmacy's VM asking them to call Larita Fife at Dr Anne Fu office at [facility] at [phone] if this Xarelto PA is required.

## 2020-09-25 ENCOUNTER — Telehealth: Payer: Self-pay

## 2020-09-25 NOTE — Telephone Encounter (Signed)
**Note De-Identified Shelena Castelluccio Obfuscation** We received another Xarelto PA request from [facility].  Prior to attempting this PA I called [facility] and was advised by the pharmacist that a Xarelto PA is not required as it is going through with ins coverage and a co-pay to the pt of $47/30 day supply which is her normal cost.

## 2020-10-07 ENCOUNTER — Other Ambulatory Visit: Payer: Self-pay | Admitting: Internal Medicine

## 2020-10-07 DIAGNOSIS — Z1231 Encounter for screening mammogram for malignant neoplasm of breast: Secondary | ICD-10-CM

## 2020-10-10 ENCOUNTER — Ambulatory Visit
Admission: RE | Admit: 2020-10-10 | Discharge: 2020-10-10 | Disposition: A | Discharge status: Home / Self Care | Payer: BC Managed Care – PPO | Source: Ambulatory Visit | Attending: Internal Medicine | Admitting: Internal Medicine

## 2020-10-10 ENCOUNTER — Other Ambulatory Visit: Payer: Self-pay

## 2020-10-10 DIAGNOSIS — Z1231 Encounter for screening mammogram for malignant neoplasm of breast: Secondary | ICD-10-CM

## 2020-10-27 ENCOUNTER — Emergency Department (HOSPITAL_COMMUNITY)
Admission: EM | Admit: 2020-10-27 | Discharge: 2020-10-27 | Disposition: A | Discharge status: Home / Self Care | Payer: BC Managed Care – PPO | Attending: Emergency Medicine | Admitting: Emergency Medicine

## 2020-10-27 ENCOUNTER — Encounter (HOSPITAL_COMMUNITY): Payer: Self-pay | Admitting: Emergency Medicine

## 2020-10-27 DIAGNOSIS — I10 Essential (primary) hypertension: Secondary | ICD-10-CM | POA: Insufficient documentation

## 2020-10-27 DIAGNOSIS — R079 Chest pain, unspecified: Secondary | ICD-10-CM

## 2020-10-27 DIAGNOSIS — Z7901 Long term (current) use of anticoagulants: Secondary | ICD-10-CM | POA: Diagnosis not present

## 2020-10-27 DIAGNOSIS — R0789 Other chest pain: Secondary | ICD-10-CM | POA: Diagnosis not present

## 2020-10-27 DIAGNOSIS — Z96651 Presence of right artificial knee joint: Secondary | ICD-10-CM | POA: Diagnosis not present

## 2020-10-27 MED ORDER — METHOCARBAMOL 500 MG PO TABS: 500.0000 mg | ORAL_TABLET | Freq: Two times a day (BID) | ORAL | 0 refills | Status: DC | PRN

## 2020-10-27 MED ORDER — DICLOFENAC SODIUM 1 % EX GEL: 2.0000 g | Freq: Four times a day (QID) | CUTANEOUS | 0 refills | Status: DC

## 2020-10-27 NOTE — ED Triage Notes (Signed)
Pt reports right sided chest pain since waking up this morning. Endorses mild SOB. Hx of PE and on blood thinner.

## 2020-10-27 NOTE — ED Provider Notes (Signed)
[facility] EMERGENCY DEPARTMENT Provider Note   CSN: [id number] Arrival date & time: 10/27/20  1115     History Chief Complaint  Patient presents with   Chest Pain    Mia [name] is a 65 y.o. female presenting for right-sided chest pain.  Patient states when she woke up this morning, she had pain on the right side of her chest.  Pain goes under her breast.  It is worse when she takes a deep breath and when she moves.  She has been doing increased activity recently, as her partner had a stroke and she is lifting him up.  There is no inciting event that she can remember however.  No fall or trauma.  She denies associated fevers, cough, nausea, vomiting, abdominal pain.  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  Not taken anything for her pain.  Pain does not radiate.  Nothing makes it better.  Recently had a mammogram which was negative.  Additional history taken chart reviewed.  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  HPI     Past Medical History:  Diagnosis Date   Allergic rhinitis, cause unspecified 04/29/2011   Anemia    Anemia, unspecified 04/23/2011   Chronic anticoagulation    Colon polyps may 2011   Diverticulosis    DVT (deep venous thrombosis) (HCC)    right upper extremitiy   GERD (gastroesophageal reflux disease) 11/10/2011   Gout 04/22/2010   H/O: GI bleed    Hyperlipidemia    Hyperplastic colon polyp    Hypertension    IBS (irritable bowel syndrome)    Infected prosthetic knee joint (HCC)    Migraine 04/29/2011   Morbid obesity (HCC)    Nonsmoker    Pulmonary embolism (HCC) 2010   following right knee replacement   WPW (Wolff-Parkinson-White syndrome)     Patient Active Problem List   Diagnosis Date Noted   Skin tag 02/21/2020   Sebaceous cyst 02/21/2020   External otitis of right  ear 02/12/2020   Vitamin D deficiency 02/12/2020   Acute hearing loss, right 05/08/2019   Pain of left heel 05/08/2019   Nutritional counseling 04/30/2019   Nocturnal leg cramps 07/13/2018   Chronic low back pain 07/13/2018   Hyperglycemia 07/13/2018   Peripheral edema 07/13/2018   Blocked Eustachian tube, bilateral 07/13/2018   Dysphagia 07/13/2018   Abdominal pain 03/16/2016   Multiple bruises 05/22/2014   Left foot pain 05/22/2014   Right calf pain 01/25/2014   Right ear pain 01/25/2014   Acute gouty arthritis 01/24/2014   Migraine 06/19/2013   Lower back pain 12/15/2012   UTI (urinary tract infection) 09/15/2012   Right lumbar radiculopathy 02/15/2012   GERD (gastroesophageal reflux disease) 11/10/2011   Allergic rhinitis 04/29/2011   Migraine 04/29/2011   Morbid obesity (HCC) 04/29/2011   Constipation 04/29/2011   Encounter for well adult exam with abnormal findings 04/23/2011   Irritable bowel syndrome with both constipation and diarrhea 04/23/2011   Anemia, unspecified 04/23/2011   Diverticulosis    Colon polyps    Wolff-Parkinson-White (WPW) syndrome 04/01/2011   Dyslipidemia 01/01/2011   Edema of foot 10/26/2010   Infection of prosthetic knee joint (HCC) 10/26/2010   Fall 09/30/2010   HTN (hypertension) 04/24/2010   Other pulmonary embolism and infarction 04/24/2010   Chronic anticoagulation    Hair loss  04/22/2010   Gout 04/22/2010   Leg swelling 04/22/2010   DVT (deep venous thrombosis) (HCC) 03/27/2010   METHICILLIN RESISTANT STAPHYLOCOCCUS AUREUS INFECTION 02/16/2010   KLEBSIELLA PNEUMONIAE INFECTION 02/16/2010   Bacteremia 02/16/2010   PROSTHETIC JOINT COMPLICATION 02/16/2010    Past Surgical History:  Procedure Laterality Date   CHOLECYSTECTOMY     TOTAL KNEE ARTHROPLASTY Right 2010   followed by [facility] for infection   TUBAL LIGATION       OB History   No obstetric history on file.     Family History  Problem Relation Age of Onset   Heart  Problems Mother    Throat cancer Father    Pancreatic cancer Sister    Hypertension Brother    Hypertension Sister    Hypertension Sister    Hypertension Sister    Colon cancer Other    Coronary artery disease Other     Social History   Tobacco Use   Smoking status: Never   Smokeless tobacco: Never  Vaping Use   Vaping Use: Never used  Substance Use Topics   Alcohol use: No   Drug use: No    Home Medications Prior to Admission medications   Medication Sig Start Date End Date Taking? Authorizing Provider  methocarbamol (ROBAXIN) 500 MG tablet Take 1 tablet (500 mg total) by mouth 2 (two) times daily as needed for muscle spasms. 10/27/20  Yes Karver Fadden, PA-C  Acetaminophen (TYLENOL PO) Take by mouth.    [provider]  acetic acid-hydrocortisone (VOSOL-HC) OTIC solution Place 3 drops into the right ear 2 (two) times daily. 05/01/19   [provider]  allopurinol (ZYLOPRIM) 300 MG tablet Take 1 tablet (300 mg total) by mouth daily. 03/05/19   Corwin Levins, MD  atorvastatin (LIPITOR) 80 MG tablet Take 1 tablet (80 mg total) by mouth daily. 08/11/20   Corwin Levins, MD  carvedilol (COREG) 6.25 MG tablet TAKE 1 TABLET(6.25 MG) BY MOUTH TWICE DAILY 04/03/20   Jake Bathe, MD  diclofenac Sodium (VOLTAREN) 1 % GEL Apply 2 g topically 4 (four) times daily. 10/27/20   Latondra Gebhart, PA-C  furosemide (LASIX) 40 MG tablet Take 1.5 tablets (60 mg total) by mouth 2 (two) times daily. 02/12/19 05/13/19  Corwin Levins, MD  HYDROcodone-acetaminophen (NORCO/VICODIN) 5-325 MG tablet Take 1 tablet by mouth every 6 (six) hours as needed. 08/09/19   Corwin Levins, MD  losartan (COZAAR) 100 MG tablet TAKE 1 TABLET BY MOUTH DAILY 02/06/20   Jake Bathe, MD  lubiprostone (AMITIZA) 24 MCG capsule Take 1 capsule (24 mcg total) by mouth 2 (two) times daily with a meal. 08/31/18   Meredith Pel, NP  neomycin-polymyxin-hydrocortisone (CORTISPORIN) OTIC solution Place 3 drops into the  right ear 3 (three) times daily. 02/12/20   Corwin Levins, MD  potassium chloride SA (K-DUR,KLOR-CON) 20 MEQ tablet Take 1 tablet (20 mEq total) by mouth daily. 05/09/17   Jake Bathe, MD  predniSONE (DELTASONE) 10 MG tablet 3 tabs by mouth per day for 3 days,2tabs per day for 3 days,1tab per day for 3 days Patient not taking: Reported on 08/11/2020 02/12/20   Corwin Levins, MD  Semaglutide,0.25 or 0.5MG /DOS, (OZEMPIC, 0.25 OR 0.5 MG/DOSE,) 2 MG/1.5ML SOPN Inject 0.5 mg into the skin once a week. 08/11/20   Corwin Levins, MD  spironolactone (ALDACTONE) 25 MG tablet Take 1 tablet (25 mg total) by mouth daily. 03/06/20   Jake Bathe,  MD  tiZANidine (ZANAFLEX) 4 MG tablet TAKE 1 TABLET(4 MG) BY MOUTH AT BEDTIME 12/07/19   Corwin Levins, MD  Vitamin D, Ergocalciferol, (DRISDOL) 1.25 MG (50000 UNIT) CAPS capsule Take 1 capsule (50,000 Units total) by mouth every 7 (seven) days. Patient not taking: No sig reported 08/31/19   Corwin Levins, MD  XARELTO 20 MG TABS tablet TAKE 1 TABLET(20 MG) BY MOUTH DAILY WITH SUPPER 03/06/20   Jake Bathe, MD    Allergies    Crestor [rosuvastatin calcium], Amlodipine, Piperacillin sod-tazobactam so, and Ultram [tramadol]  Review of Systems   Review of Systems  Cardiovascular:  Positive for chest pain (r sided).  All other systems reviewed and are negative.  Physical Exam Updated Vital Signs BP (!) 154/109 (BP Location: Right Arm)   Pulse 91   Temp 98.5 F (36.9 C) (Oral)   Resp 20   SpO2 98%   Physical Exam Vitals and nursing note reviewed.  Constitutional:      General: She is not in acute distress.    Appearance: Normal appearance. She is obese.     Comments: Resting in the chair in NAD  HENT:     Head: Normocephalic and atraumatic.  Eyes:     Extraocular Movements: Extraocular movements intact.     Conjunctiva/sclera: Conjunctivae normal.     Pupils: Pupils are equal, round, and reactive to light.  Cardiovascular:     Rate and Rhythm: Normal rate and  regular rhythm.     Pulses: Normal pulses.  Pulmonary:     Effort: Pulmonary effort is normal. No respiratory distress.     Breath sounds: Normal breath sounds. No wheezing.     Comments: Speaking in full sentences.  Clear lung sounds in all fields. Tenderness palpation of the right side chest wall.  No rash or lesions. Chest:     Chest wall: Tenderness present.  Abdominal:     General: There is no distension.     Palpations: Abdomen is soft. There is no mass.     Tenderness: There is no abdominal tenderness. There is no guarding or rebound.     Comments: No ttp of the abd. No rigidity or distention. Negative murphys   Musculoskeletal:        General: Normal range of motion.     Cervical back: Normal range of motion and neck supple.     Right lower leg: No edema.     Left lower leg: No edema.  Skin:    General: Skin is warm and dry.     Capillary Refill: Capillary refill takes less than 2 seconds.  Neurological:     Mental Status: She is alert and oriented to person, place, and time.  Psychiatric:        Mood and Affect: Mood and affect normal.        Speech: Speech normal.        Behavior: Behavior normal.    ED Results / Procedures / Treatments   Labs (all labs ordered are listed, but only abnormal results are displayed) Labs Reviewed - No data to display  EKG EKG Interpretation  Date/Time:  Monday October 27 2020 11:22:07 EDT Ventricular Rate:  87 PR Interval:  168 QRS Duration: 86 QT Interval:  356 QTC Calculation: 428 R Axis:   14 Text Interpretation: Normal sinus rhythm Low voltage QRS Cannot rule out Anterior infarct , age undetermined Abnormal ECG since last tracing no significant change Confirmed by Mancel Bale [phone]) on  10/27/2020 12:10:24 PM  Radiology No results found.  Procedures Procedures   Medications Ordered in ED Medications - No data to display  ED Course  I have reviewed the triage vital signs and the nursing notes.  Pertinent labs &  imaging results that were available during my care of the patient were reviewed by me and considered in my medical decision making (see chart for details).    MDM Rules/Calculators/A&P                           Patient presenting for evaluation of right-sided chest wall pain.  On exam, patient appears nontoxic.  Patient is reproducible with palpation the chest wall.  She recently has been doing heavy lifting, likely MSK pain. Low suspicion for PE as pt is on a blood thinner, or ACS as story is very atypical. Doubt infection such as pna. Doubt abd cause, no n/v or abd pain. However considering her history, offered evaluation with blood work, chest x-ray, and EKG versus symptomatic management and close follow-up with primary care.  Patient elects to follow-up with her primary care doctor.  Will obtain EKG to ensure no abnormality.  If normal, will plan for discharge with Voltaren gel, Robaxin, and close PCP follow-up.  Strict return precautions given.  Case discussed with attending, Dr. Effie Shy agrees to plan.  At this time, patient appears safe for discharge.  Strict return precautions given.  Patient states she understands and agrees to plan.   Final Clinical Impression(s) / ED Diagnoses Final diagnoses:  Right-sided chest pain    Rx / DC Orders ED Discharge Orders          Ordered    diclofenac Sodium (VOLTAREN) 1 % GEL  4 times daily        10/27/20 1145    methocarbamol (ROBAXIN) 500 MG tablet  2 times daily PRN        10/27/20 1145             Rehema Muffley, PA-C 10/27/20 1211    Mancel Bale, MD 10/27/20 1815

## 2020-10-27 NOTE — Discharge Instructions (Addendum)
Continue taking home medications as prescribed.  Use the voltaten gel for pain.  Use the muscle relaxer to help with pain. Have caution, this may make you tired and groggy. Do no drive while taking this medicine.  Follow-up with your primary care doctor for recheck.  Return Return to the emergency room if you develop increased shortness of breath, increased pain, any severe, worsening, or concerning symptoms

## 2020-11-05 ENCOUNTER — Telehealth: Payer: Self-pay

## 2020-11-05 NOTE — Telephone Encounter (Signed)
Patient called wanting to know if we have samples of Xarelto.   I advised her that I would check and get back to her she voiced understanding.

## 2020-11-06 NOTE — Telephone Encounter (Signed)
Will forward to [facility] to determine if patient is okay to receive samples.

## 2020-11-06 NOTE — Telephone Encounter (Signed)
  Pt is calling back to follow up, pt said she is out of meds

## 2020-11-07 NOTE — Telephone Encounter (Signed)
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 Patient has received several samples already.  I called pt to discuss. She states that she gets this medication through her workers comp but she cant get ahold of her adjuster.  I advised that she get a copay card (said she has one in her purse somewhere) Patient will work on getting copay card.

## 2020-11-07 NOTE — Telephone Encounter (Signed)
Patient had Eliquis card not Xarelto. I have activated a copay card for Xarelto. I gave pt the info so she can give to pharmacy.

## 2020-11-17 DIAGNOSIS — Z0289 Encounter for other administrative examinations: Secondary | ICD-10-CM

## 2020-11-24 ENCOUNTER — Other Ambulatory Visit: Payer: Self-pay

## 2020-11-24 ENCOUNTER — Ambulatory Visit (INDEPENDENT_AMBULATORY_CARE_PROVIDER_SITE_OTHER): Payer: BC Managed Care – PPO | Admitting: Family Medicine

## 2020-11-24 ENCOUNTER — Encounter (INDEPENDENT_AMBULATORY_CARE_PROVIDER_SITE_OTHER): Payer: Self-pay | Admitting: Family Medicine

## 2020-11-24 VITALS — BP 129/86 | HR 67 | Temp 97.9°F | Ht 63.0 in | Wt 341.0 lb

## 2020-11-24 DIAGNOSIS — E785 Hyperlipidemia, unspecified: Secondary | ICD-10-CM

## 2020-11-24 DIAGNOSIS — R4589 Other symptoms and signs involving emotional state: Secondary | ICD-10-CM | POA: Diagnosis not present

## 2020-11-24 DIAGNOSIS — Z9189 Other specified personal risk factors, not elsewhere classified: Secondary | ICD-10-CM | POA: Diagnosis not present

## 2020-11-24 DIAGNOSIS — E559 Vitamin D deficiency, unspecified: Secondary | ICD-10-CM

## 2020-11-24 DIAGNOSIS — Z6841 Body Mass Index (BMI) 40.0 and over, adult: Secondary | ICD-10-CM

## 2020-11-24 DIAGNOSIS — I10 Essential (primary) hypertension: Secondary | ICD-10-CM

## 2020-11-24 DIAGNOSIS — R5383 Other fatigue: Secondary | ICD-10-CM

## 2020-11-24 DIAGNOSIS — I739 Peripheral vascular disease, unspecified: Secondary | ICD-10-CM

## 2020-11-24 DIAGNOSIS — R0602 Shortness of breath: Secondary | ICD-10-CM | POA: Diagnosis not present

## 2020-11-25 LAB — COMPREHENSIVE METABOLIC PANEL
ALT: 11 IU/L (ref 0–32)
AST: 14 IU/L (ref 0–40)
Albumin/Globulin Ratio: 1.3 (ref 1.2–2.2)
Albumin: 4.1 g/dL (ref 3.8–4.8)
Alkaline Phosphatase: 77 IU/L (ref 44–121)
BUN/Creatinine Ratio: 20 (ref 12–28)
BUN: 17 mg/dL (ref 8–27)
Bilirubin Total: 0.7 mg/dL (ref 0.0–1.2)
CO2: 25 mmol/L (ref 20–29)
Calcium: 10.4 mg/dL — ABNORMAL HIGH (ref 8.7–10.3)
Chloride: 103 mmol/L (ref 96–106)
Creatinine, Ser: 0.84 mg/dL (ref 0.57–1.00)
Globulin, Total: 3.1 g/dL (ref 1.5–4.5)
Glucose: 83 mg/dL (ref 70–99)
Potassium: 4.4 mmol/L (ref 3.5–5.2)
Sodium: 142 mmol/L (ref 134–144)
Total Protein: 7.2 g/dL (ref 6.0–8.5)
eGFR: 78 mL/min/{1.73_m2} (ref >59)

## 2020-11-25 LAB — HEMOGLOBIN A1C
Est. average glucose Bld gHb Est-mCnc: 131 mg/dL
Hgb A1c MFr Bld: 6.2 % — ABNORMAL HIGH (ref 4.8–5.6)

## 2020-11-25 LAB — CBC WITH DIFFERENTIAL/PLATELET
Basophils Absolute: 0 10*3/uL (ref 0.0–0.2)
Basos: 0 %
EOS (ABSOLUTE): 0.1 10*3/uL (ref 0.0–0.4)
Eos: 1 %
Hematocrit: 43.6 % (ref 34.0–46.6)
Hemoglobin: 13.8 g/dL (ref 11.1–15.9)
Immature Grans (Abs): 0 10*3/uL (ref 0.0–0.1)
Immature Granulocytes: 0 %
Lymphocytes Absolute: 2.4 10*3/uL (ref 0.7–3.1)
Lymphs: 33 %
MCH: 26.7 pg (ref 26.6–33.0)
MCHC: 31.7 g/dL (ref 31.5–35.7)
MCV: 84 fL (ref 79–97)
Monocytes Absolute: 0.7 10*3/uL (ref 0.1–0.9)
Monocytes: 9 %
Neutrophils Absolute: 4 10*3/uL (ref 1.4–7.0)
Neutrophils: 57 %
Platelets: 242 10*3/uL (ref 150–450)
RBC: 5.17 x10E6/uL (ref 3.77–5.28)
RDW: 13.8 % (ref 11.7–15.4)
WBC: 7.2 10*3/uL (ref 3.4–10.8)

## 2020-11-25 LAB — LIPID PANEL WITH LDL/HDL RATIO
Cholesterol, Total: 186 mg/dL (ref 100–199)
HDL: 43 mg/dL (ref >39)
LDL Chol Calc (NIH): 119 mg/dL — ABNORMAL HIGH (ref 0–99)
LDL/HDL Ratio: 2.8 ratio (ref 0.0–3.2)
Triglycerides: 131 mg/dL (ref 0–149)
VLDL Cholesterol Cal: 24 mg/dL (ref 5–40)

## 2020-11-25 LAB — INSULIN, RANDOM: INSULIN: 13.8 u[IU]/mL (ref 2.6–24.9)

## 2020-11-25 LAB — TSH: TSH: 1.22 u[IU]/mL (ref 0.450–4.500)

## 2020-11-25 LAB — FOLATE: Folate: 13.5 ng/mL (ref >3.0)

## 2020-11-25 LAB — T4, FREE: Free T4: 1.13 ng/dL (ref 0.82–1.77)

## 2020-11-25 LAB — VITAMIN B12: Vitamin B-12: 415 pg/mL (ref 232–1245)

## 2020-11-25 LAB — VITAMIN D 25 HYDROXY (VIT D DEFICIENCY, FRACTURES): Vit D, 25-Hydroxy: 31.6 ng/mL (ref 30.0–100.0)

## 2020-11-25 NOTE — Progress Notes (Signed)
Chief Complaint:   Mia [name] (MR# [id number]) is a 65 y.o. female who presents for evaluation and treatment of Mia and related comorbidities. Current BMI is Body mass index is 60.41 kg/m. Mia [name] has been struggling with her weight for many years and has been unsuccessful in either losing weight, maintaining weight loss, or reaching her healthy weight goal.  Mia [name] is currently in the action stage of change and ready to dedicate time achieving and maintaining a healthier weight. Mia [name] is interested in becoming our patient and working on intensive lifestyle modifications including (but not limited to) diet and exercise for weight loss.  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 She lives with her friend, Mia [name]. Pt's goal weight is 130 lbs within a year and a half. She craves sweets and frequently snacks between meals and after dinner. Pt skips meals and can go all day without eating. She has IBS that causes bad symptoms, therefore, if pt is going somewhere, she won't eat all day. Pt denies ever being hungry.  Mia [name]'s habits were reviewed today and are as follows: her desired weight loss is 211 lbs, she started gaining weight after childbirth and knee surgeries, her heaviest weight ever was 350 pounds, she is a picky eater and doesn't like to eat healthier foods, she has significant food cravings issues, she snacks frequently in the evenings, she skips meals frequently, she is frequently drinking liquids with calories, she frequently makes poor food choices, she frequently eats larger portions than normal, and she struggles with emotional eating.  Depression Screen Mia [name] Food and Mood (modified PHQ-9) score was 17.  Depression screen [name] 2/9 11/24/2020  Decreased Interest 3  Down, Depressed, Hopeless 3  PHQ - 2 Score 6  Altered sleeping 1  Tired, decreased energy 2  Change in appetite 1  Feeling bad or failure about yourself  3  Trouble  concentrating 3  Moving slowly or fidgety/restless 1  Suicidal thoughts 0  PHQ-9 Score 17  Difficult doing work/chores Somewhat difficult  Some recent data might be hidden   Subjective:   1. Other fatigue Mia [name] denies daytime somnolence and denies waking up still tired. Patent has a history of symptoms of morning headache. Mia [name] generally gets 4 or 5 hours of sleep per night, and states that she has poor sleep quality. Snoring "I don't know" present. Apneic episodes are present. Epworth Sleepiness Score is 1. Pt has bad IBS causing fatigue. She is managed by Dr. Ardis Hughs a GI.  2. Shortness of breath on exertion Mia [name] notes increasing shortness of breath with exercising and seems to be worsening over time with weight gain. She notes getting out of breath sooner with activity than she used to. This has gotten worse recently. Mia [name] denies shortness of breath at rest or orthopnea.  3. Essential hypertension BP at goal today. Medication: losartan, spironolactone, Coreg  4. Hyperlipidemia, unspecified hyperlipidemia type Mia [name] is managed by Dr. [name] at cardiology. Medication: Lipitor  5. Vitamin D deficiency Pt takes OTC Vit D 2,000 IU daily.  6. PVD (peripheral vascular disease) ([place]) Pt's has history of blood clots and is on Xarelto chronically. She has history of diffuse coagulation post surgically. Pt has had bilateral DVT's and PE's and is managed by Dr. [name] at cardiology.  7. Depressed mood PHQ= 17 today. Pt denies suicidal or homicidal ideations. She is not on medication. Pt denies emotional eating due to emotions. Pt denies depression or mood disorder, but occasionally has a depressed  mood.   8. At risk for malnutrition Mia [name] is at increased risk for malnutrition due to inadequate intake.  Assessment/Plan:   Orders Placed This Encounter  Procedures   Vitamin B12   CBC with Differential/Platelet   Comprehensive metabolic panel   Folate    Hemoglobin A1c   Insulin, random   Lipid Panel With LDL/HDL Ratio   T4, free   TSH   VITAMIN D 25 Hydroxy (Vit-D Deficiency, Fractures)    Medications Discontinued During This Encounter  Medication Reason   Vitamin D, Ergocalciferol, (DRISDOL) 1.25 MG (50000 UNIT) CAPS capsule    Acetaminophen (TYLENOL PO)    acetic acid-hydrocortisone (VOSOL-HC) OTIC solution    allopurinol (ZYLOPRIM) 300 MG tablet    diclofenac Sodium (VOLTAREN) 1 % GEL    furosemide (LASIX) 40 MG tablet    HYDROcodone-acetaminophen (NORCO/VICODIN) 5-325 MG tablet    lubiprostone (AMITIZA) 24 MCG capsule    methocarbamol (ROBAXIN) 500 MG tablet    neomycin-polymyxin-hydrocortisone (CORTISPORIN) OTIC solution    potassium chloride SA (K-DUR,KLOR-CON) 20 MEQ tablet    predniSONE (DELTASONE) 10 MG tablet    Semaglutide,0.25 or 0.5MG /DOS, (OZEMPIC, 0.25 OR 0.5 MG/DOSE,) 2 MG/1.5ML SOPN    tiZANidine (ZANAFLEX) 4 MG tablet      No orders of the defined types were placed in this encounter.    1. Other fatigue Mia [name] does feel that her weight is causing her energy to be lower than it should be. Fatigue may be related to Mia, depression or many other causes. Labs will be ordered, and in the meanwhile, Mia [name] will focus on self care including making healthy food choices, increasing physical activity and focusing on stress reduction. Check labs today.  - Vitamin B12 - Folate - Hemoglobin A1c - Insulin, random  2. Shortness of breath on exertion Mia [name] does feel that she gets out of breath more easily that she used to when she exercises. Mia [name]'s shortness of breath appears to be Mia related and exercise induced. She has agreed to work on weight loss and gradually increase exercise to treat her exercise induced shortness of breath. Will continue to monitor closely.  3. Essential hypertension Mia [name] is working on healthy weight loss and exercise to improve blood pressure control. We will watch  for signs of hypotension as she continues her lifestyle modifications. Check labs today.  - CBC with Differential/Platelet - Comprehensive metabolic panel - T4, free - TSH  4. Hyperlipidemia, unspecified hyperlipidemia type Cardiovascular risk and specific lipid/LDL goals reviewed.  We discussed several lifestyle modifications today and Mia [name] will continue to work on diet, exercise and weight loss efforts. Orders and follow up as documented in patient record.   Counseling Intensive lifestyle modifications are the first line treatment for this issue. Dietary changes: Increase soluble fiber. Decrease simple carbohydrates. Exercise changes: Moderate to vigorous-intensity aerobic activity 150 minutes per week if tolerated. Lipid-lowering medications: see documented in medical record. Check labs today.  - Comprehensive metabolic panel - Lipid Panel With LDL/HDL Ratio  5. Vitamin D deficiency Low Vitamin D level contributes to fatigue and are associated with Mia, breast, and colon cancer. She agrees to continue to take OTC Vitamin D 2,000 IU QD and will follow-up for routine testing of Vitamin D, at least 2-3 times per year to avoid over-replacement. Check labs today.  - VITAMIN D 25 Hydroxy (Vit-D Deficiency, Fractures)  6. PVD (peripheral vascular disease) ([place]) Continue treatment per cardiology and PCP. Pt has ECG's with cardiology.  7. Depressed mood Pt denies emotional  eating or depression.   8. At risk for malnutrition Mia [name] was given approximately 23 minutes of counseling today regarding prevention of malnutrition and ways to meet macronutrient goals..   9. Mia with current BMI of 60.5  Mia [name] is currently in the action stage of change and her goal is to continue with weight loss efforts. I recommend Mia [name] begin the structured treatment plan as follows:  She has agreed to the Category 2 Plan.  Exercise goals:  As is    Behavioral modification  strategies: increasing lean protein intake, no skipping meals, and planning for success.  She was informed of the importance of frequent follow-up visits to maximize her success with intensive lifestyle modifications for her multiple health conditions. She was informed we would discuss her lab results at her next visit unless there is a critical issue that needs to be addressed sooner. Mia [name] agreed to keep her next visit at the agreed upon time to discuss these results.  Objective:   Blood pressure 129/86, pulse 67, temperature 97.9 F (36.6 C), height 5\' 3"  (1.6 m), weight (!) 341 lb (154.7 kg), SpO2 98 %. Body mass index is 60.41 kg/m.  EKG: Normal sinus rhythm, rate 84 (done 10/27/2020).  Indirect Calorimeter completed today shows a VO2 of 274 and a REE of 1886.  Her calculated basal metabolic rate is 10/29/2020 thus her basal metabolic rate is worse than expected.  General: Cooperative, alert, well developed, in no acute distress. HEENT: Conjunctivae and lids unremarkable. Cardiovascular: Regular rhythm.  Lungs: Normal work of breathing. Neurologic: No focal deficits.   Lab Results  Component Value Date   CREATININE 0.84 11/24/2020   BUN 17 11/24/2020   NA 142 11/24/2020   K 4.4 11/24/2020   CL 103 11/24/2020   CO2 25 11/24/2020   Lab Results  Component Value Date   ALT 11 11/24/2020   AST 14 11/24/2020   ALKPHOS 77 11/24/2020   BILITOT 0.7 11/24/2020   Lab Results  Component Value Date   HGBA1C 6.2 (H) 11/24/2020   HGBA1C 6.5 08/11/2020   HGBA1C 6.7 (H) 02/12/2020   HGBA1C 6.5 (H) 08/09/2019   HGBA1C 6.3 07/13/2018   Lab Results  Component Value Date   INSULIN 13.8 11/24/2020   Lab Results  Component Value Date   TSH 1.220 11/24/2020   Lab Results  Component Value Date   CHOL 186 11/24/2020   HDL 43 11/24/2020   LDLCALC 119 (H) 11/24/2020   LDLDIRECT 148.9 12/11/2012   TRIG 131 11/24/2020   CHOLHDL 4 08/11/2020   Lab Results  Component Value Date    WBC 7.2 11/24/2020   HGB 13.8 11/24/2020   HCT 43.6 11/24/2020   MCV 84 11/24/2020   PLT 242 11/24/2020   Lab Results  Component Value Date   IRON 89 07/13/2018   TIBC 243 (L) 02/08/2010   FERRITIN 287.3 05/24/2014    Attestation Statements:   Reviewed by clinician on day of visit: allergies, medications, problem list, medical history, surgical history, family history, social history, and previous encounter notes.  05/26/2014, CMA, am acting as transcriptionist for Edmund Hilda, DO.  I have reviewed the above documentation for accuracy and completeness, and I agree with the above. [employer], D.O.  The 21st Century Cures Act was signed into law in 2016 which includes the topic of electronic health records.  This provides immediate access to information in MyChart.  This includes consultation notes, operative notes, office notes, lab results and  pathology reports.  If you have any questions about what you read please let us know at your next visit so we can discuss your concerns and take corrective action if need be.  We are right here with you.

## 2020-12-03 NOTE — Addendum Note (Signed)
Addended by: Carlye Grippe on: 12/03/2020 07:18 PM   Modules accepted: Level of Service

## 2020-12-08 ENCOUNTER — Ambulatory Visit (INDEPENDENT_AMBULATORY_CARE_PROVIDER_SITE_OTHER): Payer: BC Managed Care – PPO | Admitting: Family Medicine

## 2020-12-08 ENCOUNTER — Other Ambulatory Visit: Payer: Self-pay

## 2020-12-08 ENCOUNTER — Encounter (INDEPENDENT_AMBULATORY_CARE_PROVIDER_SITE_OTHER): Payer: Self-pay | Admitting: Family Medicine

## 2020-12-08 VITALS — BP 137/78 | HR 62 | Temp 97.1°F | Ht 63.0 in | Wt 336.0 lb

## 2020-12-08 DIAGNOSIS — E1159 Type 2 diabetes mellitus with other circulatory complications: Secondary | ICD-10-CM | POA: Diagnosis not present

## 2020-12-08 DIAGNOSIS — E559 Vitamin D deficiency, unspecified: Secondary | ICD-10-CM | POA: Diagnosis not present

## 2020-12-08 DIAGNOSIS — Z9189 Other specified personal risk factors, not elsewhere classified: Secondary | ICD-10-CM

## 2020-12-08 DIAGNOSIS — E1169 Type 2 diabetes mellitus with other specified complication: Secondary | ICD-10-CM

## 2020-12-08 DIAGNOSIS — I152 Hypertension secondary to endocrine disorders: Secondary | ICD-10-CM

## 2020-12-08 DIAGNOSIS — E785 Hyperlipidemia, unspecified: Secondary | ICD-10-CM

## 2020-12-08 DIAGNOSIS — Z6841 Body Mass Index (BMI) 40.0 and over, adult: Secondary | ICD-10-CM

## 2020-12-08 MED ORDER — DIALYVITE VITAMIN D 5000 125 MCG (5000 UT) PO CAPS: 5000.0000 [IU] | ORAL_CAPSULE | Freq: Every day | ORAL | 0 refills | Status: DC

## 2020-12-09 NOTE — Progress Notes (Signed)
Chief Complaint:   OBESITY Mia [name] is here to discuss her progress with her obesity treatment plan along with follow-up of her obesity related diagnoses. [name] is on the Category 2 Plan and states she is following her eating plan approximately 98-99% of the time. Mia [name] states she is doing general housework.  Today's visit was #: 2 Starting weight: 341 lbs Starting date: 11/24/2020 Today's weight: 336 lbs Today's date: 12/08/2020 Total lbs lost to date: 5 Total lbs lost since last in-office visit: 5  Interim History:  Mia [name] is here today for her first follow-up office visit since starting the program with [place].  All blood work/ lab tests that were recently ordered by myself or an outside provider were reviewed with patient today per their request.   Extended time was spent counseling her on all new disease processes that were discovered or preexisting ones that are affected by BMI.  she understands that many of these abnormalities will need to monitored regularly along with the current treatment plan of prudent dietary changes, in which we are making each and every office visit, to improve these health parameters.  [name] is here for a follow up office visit.  We reviewed her meal plan and questions were answered.  Patient's food recall appears to be accurate and consistent with what is on plan when she is following it.   When eating on plan, her hunger and cravings are well controlled.   Mia [name] has no issues with plan. She reports it's difficult to find all foods on plan, such as greek yogurt and low calories bread.    Subjective:   1. Type 2 diabetes mellitus with other specified complication, without long-term current use of insulin ([name]) New. Discussed labs with patient today. Pt is "not claiming" her diabetes diagnosis. She has never been on medication. Fasting blood sugars are in the 100's with the highest being 130 or so.  2. Hyperlipidemia  associated with type 2 diabetes mellitus ([place]) New. Discussed labs with patient today. Not at goal. [name] has not been on meds for 2 weeks, as they are too expensive.  3. Hypertension associated with type 2 diabetes mellitus ([name]) New. Discussed labs with patient today. BP at goal. CMP within normal limits.  4. Vitamin D deficiency Worsening. Discussed labs with patient today. Not at goal. Pt occasionally takes OTC Vit D 2,000 IU.  5. At risk for heart disease Mia [name] is at a higher than average risk for cardiovascular disease due to obesity, diabetes mellitus, hyperlipidemia, and hypertension.  Assessment/Plan:   Medications Discontinued During This Encounter  Medication Reason   Cholecalciferol (DIALYVITE VITAMIN D 5000) 125 MCG (5000 UT) capsule Reorder     Meds ordered this encounter  Medications   Cholecalciferol (DIALYVITE VITAMIN D 5000) 125 MCG (5000 UT) capsule    Sig: Take 1 capsule (5,000 Units total) by mouth daily.    Dispense:  30 capsule    Refill:  0     1. Type 2 diabetes mellitus with other specified complication, without long-term current use of insulin (HCC) Good blood sugar control is important to decrease the likelihood of diabetic complications such as nephropathy, neuropathy, limb loss, blindness, coronary artery disease, and death. Intensive lifestyle modification including diet, exercise and weight loss are the first line of treatment for diabetes. Consider medication in the future as needed.  2. Hyperlipidemia associated with type 2 diabetes mellitus ([name]) Cardiovascular risk and specific lipid/LDL goals reviewed.  We discussed  several lifestyle modifications today and Mia [name] will continue to work on diet, exercise and weight loss efforts. Orders and follow up as documented in patient record. LDL is not at goal. Pt was educated on the difference in goal of <70 with diabetes patients. Pt advised to take medication regularly and prudent nutritional  plan. Recheck labs in 3 months.  Counseling Intensive lifestyle modifications are the first line treatment for this issue. Dietary changes: Increase soluble fiber. Decrease simple carbohydrates. Exercise changes: Moderate to vigorous-intensity aerobic activity 150 minutes per week if tolerated. Lipid-lowering medications: see documented in medical record.  3. Hypertension associated with type 2 diabetes mellitus ([name]) [name] is working on healthy weight loss and exercise to improve blood pressure control. We will watch for signs of hypotension as she continues her lifestyle modifications. BP at goal.  Cont all 3 current med doses.   4. Vitamin D deficiency Low Vitamin D level contributes to fatigue and are associated with obesity, breast, and colon cancer. She agrees to Start to take OTC Vitamin D 5,000 IU daily and will follow-up for routine testing of Vitamin D, at least 2-3 times per year to avoid over-replacement. Recheck lab in 3 months. Start- Cholecalciferol (DIALYVITE VITAMIN D 5000) 125 MCG (5000 UT) capsule; Take 1 capsule (5,000 Units total) by mouth daily.  Dispense: 30 capsule; Refill: 0   5. At risk for heart disease Due to Mia [name]'s current state of health and medical condition(s), she is at a higher risk for heart disease.  This puts the patient at much greater risk to subsequently develop cardiopulmonary conditions that can significantly affect patient's quality of life in a negative manner.    At least 18 minutes were spent on counseling Mia [name] about these concerns today, and I stressed the importance of reversing risks factors of obesity, especially truncal and visceral fat, hypertension, hyperlipidemia, and pre-diabetes.  The initial goal is to lose at least 5-10% of starting weight to help reduce these risk factors.  Counseling:  Intensive lifestyle modifications were discussed with [name] as the most appropriate first line of treatment.  she will continue to work  on diet, exercise, and weight loss efforts.  We will continue to reassess these conditions on a fairly regular basis in an attempt to decrease the patient's overall morbidity and mortality.  Evidence-based interventions for health behavior change were utilized today including the discussion of self monitoring techniques, problem-solving barriers, and SMART goal setting techniques.  Specifically, regarding patient's less desirable eating habits and patterns, we employed the technique of small changes when Jacelynn has not been able to fully commit to her prudent nutritional plan.  6. Obesity with current BMI of 59.5  Ardine is currently in the action stage of change. As such, her goal is to continue with weight loss efforts. She has agreed to the Category 2 Plan.   Exercise goals:  As is  Behavioral modification strategies: increasing lean protein intake, increasing water intake, meal planning and cooking strategies, and planning for success.  Emmakate has agreed to follow-up with our clinic in 2 weeks. She was informed of the importance of frequent follow-up visits to maximize her success with intensive lifestyle modifications for her multiple health conditions.   Objective:   Blood pressure 137/78, pulse 62, temperature (!) 97.1 F (36.2 C), height 5\' 3"  (1.6 m), weight (!) 336 lb (152.4 kg), SpO2 96 %. Body mass index is 59.52 kg/m.  General: Cooperative, alert, well developed, in no acute distress. HEENT: Conjunctivae and lids unremarkable.  Cardiovascular: Regular rhythm.  Lungs: Normal work of breathing. Neurologic: No focal deficits.   Lab Results  Component Value Date   CREATININE 0.84 11/24/2020   BUN 17 11/24/2020   NA 142 11/24/2020   K 4.4 11/24/2020   CL 103 11/24/2020   CO2 25 11/24/2020   Lab Results  Component Value Date   ALT 11 11/24/2020   AST 14 11/24/2020   ALKPHOS 77 11/24/2020   BILITOT 0.7 11/24/2020   Lab Results  Component Value Date   HGBA1C  6.2 (H) 11/24/2020   HGBA1C 6.5 08/11/2020   HGBA1C 6.7 (H) 02/12/2020   HGBA1C 6.5 (H) 08/09/2019   HGBA1C 6.3 07/13/2018   Lab Results  Component Value Date   INSULIN 13.8 11/24/2020   Lab Results  Component Value Date   TSH 1.220 11/24/2020   Lab Results  Component Value Date   CHOL 186 11/24/2020   HDL 43 11/24/2020   LDLCALC 119 (H) 11/24/2020   LDLDIRECT 148.9 12/11/2012   TRIG 131 11/24/2020   CHOLHDL 4 08/11/2020   Lab Results  Component Value Date   VD25OH 31.6 11/24/2020   VD25OH 24.63 (L) 08/11/2020   VD25OH 22.29 (L) 02/12/2020   Lab Results  Component Value Date   WBC 7.2 11/24/2020   HGB 13.8 11/24/2020   HCT 43.6 11/24/2020   MCV 84 11/24/2020   PLT 242 11/24/2020   Lab Results  Component Value Date   IRON 89 07/13/2018   TIBC 243 (L) 02/08/2010   FERRITIN 287.3 05/24/2014    Attestation Statements:   Reviewed by clinician on day of visit: allergies, medications, problem list, medical history, surgical history, family history, social history, and previous encounter notes.  Edmund Hilda, CMA, am acting as transcriptionist for [employer], DO.  I have reviewed the above documentation for accuracy and completeness, and I agree with the above. -Carlye Grippe, D.O.  The 21st Century Cures Act was signed into law in 2016 which includes the topic of electronic health records.  This provides immediate access to information in MyChart.  This includes consultation notes, operative notes, office notes, lab results and pathology reports.  If you have any questions about what you read please let us know at your next visit so we can discuss your concerns and take corrective action if need be.  We are right here with you.

## 2020-12-23 ENCOUNTER — Other Ambulatory Visit: Payer: Self-pay

## 2020-12-23 ENCOUNTER — Ambulatory Visit (INDEPENDENT_AMBULATORY_CARE_PROVIDER_SITE_OTHER): Payer: BC Managed Care – PPO | Admitting: Family Medicine

## 2020-12-23 ENCOUNTER — Encounter (INDEPENDENT_AMBULATORY_CARE_PROVIDER_SITE_OTHER): Payer: Self-pay | Admitting: Family Medicine

## 2020-12-23 VITALS — BP 122/78 | HR 70 | Temp 98.0°F | Ht 63.0 in | Wt 334.0 lb

## 2020-12-23 DIAGNOSIS — Z6841 Body Mass Index (BMI) 40.0 and over, adult: Secondary | ICD-10-CM | POA: Diagnosis not present

## 2020-12-23 DIAGNOSIS — E1169 Type 2 diabetes mellitus with other specified complication: Secondary | ICD-10-CM | POA: Diagnosis not present

## 2020-12-23 DIAGNOSIS — Z9189 Other specified personal risk factors, not elsewhere classified: Secondary | ICD-10-CM | POA: Diagnosis not present

## 2020-12-23 MED ORDER — METFORMIN HCL 500 MG PO TABS: ORAL_TABLET | ORAL | 0 refills | Status: DC

## 2020-12-23 NOTE — Progress Notes (Signed)
Chief Complaint:   OBESITY [name] is here to discuss her progress with her obesity treatment plan along with follow-up of her obesity related diagnoses. Mia [name] is on the Category 2 Plan and states she is following her eating plan approximately 75% of the time. [name] states she is raking leaves 1 times per week.  Today's visit was #: 3 Starting weight: 341 lbs Starting date: 11/24/2020 Today's weight: 334 lbs Today's date: 12/23/2020 Total lbs lost to date: 7 Total lbs lost since last in-office visit: 2  Interim History: Mia [name] is here for a follow up office visit.  We reviewed her meal plan and questions were answered.  Patient's food recall appears to be accurate and consistent with what is on plan when she is following it.   When eating on plan, her hunger and cravings are well controlled.   Pt hurt her knee recently after raking leaves.  Subjective:   1. Type 2 diabetes mellitus with other specified complication, without long-term current use of insulin (HCC) Pt's blood sugars range 76-136 recently. She checks her sugars QD. Pt is a Engineer, production and gets sweets cravings.   2. At risk for activity intolerance Mia [name] is at risk for exercise intolerance due to knee injury.  Assessment/Plan:  No orders of the defined types were placed in this encounter.   There are no discontinued medications.   Meds ordered this encounter  Medications   metFORMIN (GLUCOPHAGE) 500 MG tablet    Sig: 1 po with lunch daily    Dispense:  30 tablet    Refill:  0    30 d supply;  ** OV for RF **   Do not send RF request     1. Type 2 diabetes mellitus with other specified complication, without long-term current use of insulin (HCC) Good blood sugar control is important to decrease the likelihood of diabetic complications such as nephropathy, neuropathy, limb loss, blindness, coronary artery disease, and death. Intensive lifestyle modification including diet, exercise and  weight loss are the first line of treatment for diabetes. Mia [name] will start Metformin 500 mg QD. Decrease carbs and increase proteins.  Start- metFORMIN (GLUCOPHAGE) 500 MG tablet; 1 po with lunch daily  Dispense: 30 tablet; Refill: 0  2. At risk for activity intolerance [name] was given approximately 9 minutes of exercise intolerance counseling today. She is 65 y.o. female and has risk factors exercise intolerance including obesity. We discussed intensive lifestyle modifications today with an emphasis on specific weight loss instructions and strategies. [name] will slowly increase activity as tolerated.  Repetitive spaced learning was employed today to elicit superior memory formation and behavioral change.  3. Obesity with current BMI of 59.2  Mia [name] is currently in the action stage of change. As such, her goal is to continue with weight loss efforts. She has agreed to the Category 2 Plan.   Exercise goals:  As is  Behavioral modification strategies: holiday eating strategies , avoiding temptations, and planning for success.  [name] has agreed to follow-up with our clinic in 3-4 weeks. She was informed of the importance of frequent follow-up visits to maximize her success with intensive lifestyle modifications for her multiple health conditions.   Objective:   Blood pressure 122/78, pulse 70, temperature 98 F (36.7 C), height 5\' 3"  (1.6 m), weight (!) 334 lb (151.5 kg), SpO2 97 %. Body mass index is 59.17 kg/m.  General: Cooperative, alert, well developed, in no acute distress. HEENT: Conjunctivae and lids unremarkable. Cardiovascular:  Regular rhythm.  Lungs: Normal work of breathing. Neurologic: No focal deficits.   Lab Results  Component Value Date   CREATININE 0.84 11/24/2020   BUN 17 11/24/2020   NA 142 11/24/2020   K 4.4 11/24/2020   CL 103 11/24/2020   CO2 25 11/24/2020   Lab Results  Component Value Date   ALT 11 11/24/2020   AST 14 11/24/2020    ALKPHOS 77 11/24/2020   BILITOT 0.7 11/24/2020   Lab Results  Component Value Date   HGBA1C 6.2 (H) 11/24/2020   HGBA1C 6.5 08/11/2020   HGBA1C 6.7 (H) 02/12/2020   HGBA1C 6.5 (H) 08/09/2019   HGBA1C 6.3 07/13/2018   Lab Results  Component Value Date   INSULIN 13.8 11/24/2020   Lab Results  Component Value Date   TSH 1.220 11/24/2020   Lab Results  Component Value Date   CHOL 186 11/24/2020   HDL 43 11/24/2020   LDLCALC 119 (H) 11/24/2020   LDLDIRECT 148.9 12/11/2012   TRIG 131 11/24/2020   CHOLHDL 4 08/11/2020   Lab Results  Component Value Date   VD25OH 31.6 11/24/2020   VD25OH 24.63 (L) 08/11/2020   VD25OH 22.29 (L) 02/12/2020   Lab Results  Component Value Date   WBC 7.2 11/24/2020   HGB 13.8 11/24/2020   HCT 43.6 11/24/2020   MCV 84 11/24/2020   PLT 242 11/24/2020   Lab Results  Component Value Date   IRON 89 07/13/2018   TIBC 243 (L) 02/08/2010   FERRITIN 287.3 05/24/2014   Attestation Statements:   Reviewed by clinician on day of visit: allergies, medications, problem list, medical history, surgical history, family history, social history, and previous encounter notes.  Coral Ceo, CMA, am acting as transcriptionist for [employer], DO.  I have reviewed the above documentation for accuracy and completeness, and I agree with the above. Marjory Sneddon, D.O.  The [place] was signed into law in 2016 which includes the topic of electronic health records.  This provides immediate access to information in MyChart.  This includes consultation notes, operative notes, office notes, lab results and pathology reports.  If you have any questions about what you read please let us know at your next visit so we can discuss your concerns and take corrective action if need be.  We are right here with you.

## 2021-01-06 NOTE — Progress Notes (Signed)
Cardiology Office Note    Date:  01/13/2021   ID:  Mia [name], DOB 10-30-1955, MRN [id number]   PCP:  Mia [name], James W, [name]   [facility]  Cardiologist:  Mia SchultzMark Skains, [name]   Advanced Practice Provider:  No care team member to display Electrophysiologist:  None   [id number]}   Chief Complaint  Patient presents with   Follow-up   Chest Pain   Palpitations    History of Present Illness:  Mia GradCatherine J [name] is a 66 y.o. 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  Last saw Dr. Anne [name] 01/10/20 and doing well.  Went to ER 10/2020 with right sided chest pain felt to be M-S.  Patient says last Sat she was raking leaves for a couple hours and had some palpitations & chest heaviness that eased after 30 min rest. She's not used to doing this work but her partner had a stroke. She has had it while bringing trash cans up long driveway. She says it's similar to pain when she went to ER 10/27/20 and relieved with muscle relaxant.  She's frustrated with [facility]. Has a lot of stomach issues-constipation and diarrhea. Also has chest pain when she gets stressed out.   Past Medical History:  Diagnosis Date   Allergic rhinitis, cause unspecified 04/29/2011   Anemia    Anemia, unspecified 04/23/2011   Back pain    Bilateral swelling of feet and ankles    Chronic anticoagulation    Chronic headaches    Colon polyps 05/2009   Constipation    Diverticulosis    DVT (deep venous thrombosis) (HCC)    right upper extremitiy   GERD (gastroesophageal reflux disease) 11/10/2011   Gout 04/22/2010   H/O blood clots    H/O: GI bleed    Hyperlipidemia    Hyperplastic colon polyp    Hypertension    IBS (irritable bowel syndrome)    Infected prosthetic knee joint  (HCC)    Joint pain    Migraine 04/29/2011   Morbid obesity (HCC)    Nonsmoker    Other fatigue    Pulmonary embolism (HCC) 2010   following right knee replacement   Shortness of breath    SOBOE (shortness of breath on exertion)    Vitamin D deficiency    WPW (Wolff-Parkinson-White syndrome)     Past Surgical History:  Procedure Laterality Date   CHOLECYSTECTOMY     TOTAL KNEE ARTHROPLASTY Right 2010   followed by I & D for infection   TUBAL LIGATION      Current Medications: Current Meds  Medication Sig   atorvastatin (LIPITOR) 80 MG tablet Take 1 tablet (80 mg total) by mouth daily.   carvedilol (COREG) 6.25 MG tablet TAKE 1 TABLET(6.25 MG) BY MOUTH TWICE DAILY   Cholecalciferol (DIALYVITE VITAMIN D 5000) 125 MCG (5000 UT) capsule Take 1 capsule (5,000 Units total) by mouth daily.   losartan (COZAAR) 100 MG tablet TAKE 1 TABLET BY MOUTH DAILY   metFORMIN (GLUCOPHAGE) 500 MG tablet 1 po with lunch daily   spironolactone (ALDACTONE) 25 MG tablet Take 1 tablet (25 mg total) by mouth daily.   XARELTO 20 MG TABS tablet TAKE 1 TABLET(20 MG) BY MOUTH DAILY WITH SUPPER     Allergies:   Crestor [employer][rosuvastatin  calcium], Amlodipine, Piperacillin sod-tazobactam so, and Ultram [tramadol]   Social History   Socioeconomic History   Marital status: Media planner    Spouse name: Conservation officer, nature   Number of children: 2   Years of education: Not on file   Highest education level: Not on file  Occupational History   Occupation: TEACHER ASST    Employer: [employer]  Tobacco Use   Smoking status: Never   Smokeless tobacco: Never  Vaping Use   Vaping Use: Never used  Substance and Sexual Activity   Alcohol use: No   Drug use: No   Sexual activity: Not Currently    Partners: Male  Other Topics Concern   Not on file  Social History Narrative   Not on file   Social Determinants of Health   Financial Resource Strain: Not on file  Food Insecurity: Not on file  Transportation  Needs: Not on file  Physical Activity: Not on file  Stress: Not on file  Social Connections: Not on file     Family History:  The patient's  family history includes Colon cancer in an other family member; Coronary artery disease in an other family member; Diabetes in her father; Heart Problems in her mother; Heart disease in her mother and another family member; Hypertension in her brother, sister, sister, and sister; Pancreatic cancer in her sister; Throat cancer in her father.   ROS:   Please see the history of present illness.    ROS All other systems reviewed and are negative.   PHYSICAL EXAM:   VS:  BP 136/72    Pulse 80    Ht 5\' 3"  (1.6 m)    Wt (!) 340 lb 3.2 oz (154.3 kg)    SpO2 97%    BMI 60.26 kg/m   Physical Exam  GEN: Obese, in no acute distress  HEENT: normal  Neck: no JVD, carotid bruits, or masses Cardiac:RRR; no murmurs, rubs, or gallops  Respiratory:  clear to auscultation bilaterally, normal work of breathing GI: soft, nontender, nondistended, + BS Ext: without cyanosis, clubbing, or edema, Good distal pulses bilaterally Neuro:  Alert and Oriented x 3, Psych: euthymic mood, full affect  Wt Readings from Last 3 Encounters:  01/13/21 (!) 340 lb 3.2 oz (154.3 kg)  12/23/20 (!) 334 lb (151.5 kg)  12/08/20 (!) 336 lb (152.4 kg)      Studies/Labs Reviewed:   EKG:  EKG is not ordered today.  The ekg reviewed from ED 10/2020 and no change  Recent Labs: 11/24/2020: ALT 11; BUN 17; Creatinine, Ser 0.84; Hemoglobin 13.8; Platelets 242; Potassium 4.4; Sodium 142; TSH 1.220   Lipid Panel    Component Value Date/Time   CHOL 186 11/24/2020 1523   TRIG 131 11/24/2020 1523   HDL 43 11/24/2020 1523   CHOLHDL 4 08/11/2020 1210   VLDL 18.6 08/11/2020 1210   LDLCALC 119 (H) 11/24/2020 1523   LDLCALC 153 (H) 08/09/2019 1439   LDLDIRECT 148.9 12/11/2012 0934    Additional studies/ records that were reviewed today include:  Echo 2012:  - Left ventricle: The cavity  size was normal. There was mild     concentric hypertrophy. Systolic function was normal. The     estimated ejection fraction was in the range of 55% to 60%.   - Left atrium: The atrium was mildly dilated.   - Pulmonary arteries: Systolic pressure was mildly increased. PA     peak pressure: 78mm Hg (S).   Impressions:    -  Extremely limited due to poor sound wave transmission; LV function     appears to be preserved; focal wall motion abnormality cannot be     excluded; right heart not well visualized; suggest TEE or MUGA if     clinically indicated.        Risk Assessment/Calculations:         ASSESSMENT:    1. Wolff-Parkinson-White (WPW) syndrome   2. Chest pain, unspecified type   3. Palpitations   4. Primary hypertension   5. Hyperlipidemia, unspecified hyperlipidemia type   6. History of DVT (deep vein thrombosis)   7. Irritable bowel syndrome with both constipation and diarrhea   8. Morbid obesity, unspecified obesity type (HCC)   9. Prediabetes      PLAN:  In order of problems listed above:  WPW pattern on ECG intermittently, mild, now on low dose carvedilol. This was diagnosed previously by Dr. Patty SermonsBrackbill. Place Zio with recent palpitations  Chest pain-tightness with yard work that she's not used to doing. Multiple CV risk factors. Will order Lexiscan. Too large for CT scan.   Hypertensive heart disease without heart failure-well controlled   Hyperlipidemia LDL  high intensity statin 80 mg atorvastatin. Latest LDL 119 11/2020   History of DVT/PE on Xarelto,no bleeding problems. Labs stable 11/2020   IBS with constipation/diarrhea-refer back to GI-saw Dr. Christella HartiganJacobs in 2011.   Morbid obesity  Continue to encourage weight loss. Nutritionist.  She states that she does not eat much at all.  Challenging.  Prediabetes A1C 6.2 on metformin for weight loss.         Shared Decision Making/Informed Consent   Shared Decision Making/Informed Consent The risks  [chest pain, shortness of breath, cardiac arrhythmias, dizziness, blood pressure fluctuations, myocardial infarction, stroke/transient ischemic attack, nausea, vomiting, allergic reaction, radiation exposure, metallic taste sensation and life-threatening complications (estimated to be 1 in 10,000)], benefits (risk stratification, diagnosing coronary artery disease, treatment guidance) and alternatives of a nuclear stress test were discussed in detail with Ms. Hyacinth MeekerMiller and she agrees to proceed.    Medication Adjustments/Labs and Tests Ordered: Current medicines are reviewed at length with the patient today.  Concerns regarding medicines are outlined above.  Medication changes, Labs and Tests ordered today are listed in the Patient Instructions below. Patient Instructions  Medication Instructions:  Your physician recommends that you continue on your current medications as directed. Please refer to the Current Medication list given to you today.  *If you need a refill on your cardiac medications before your next appointment, please call your pharmacy*   Lab Work: None If you have labs (blood work) drawn today and your tests are completely normal, you will receive your results only by: MyChart Message (if you have MyChart) OR A paper copy in the mail If you have any lab test that is abnormal or we need to change your treatment, we will call you to review the results.   Testing/Procedures: Your physician has requested that you have a lexiscan myoview. For further information please visit [email or web address]. Please follow instruction sheet, as given.   Follow-Up: At [facility], you and your health needs are our priority.  As part of our continuing mission to provide you with exceptional heart care, we have created designated Provider Care Teams.  These Care Teams include your primary Cardiologist (physician) and Advanced Practice Providers (APPs -  Physician Assistants and Nurse Practitioners)  who all work together to provide you with the care you need, when you need it.  We recommend signing up for the patient portal called "MyChart".  Sign up information is provided on this After Visit Summary.  MyChart is used to connect with patients for Virtual Visits (Telemedicine).  Patients are able to view lab/test results, encounter notes, upcoming appointments, etc.  Non-urgent messages can be sent to your provider as well.   To learn more about what you can do with MyChart, go to [email or web address].    Your next appointment:   1 year(s)  The format for your next appointment:   In Person  Provider:   Donato Schultz, [name]   Other Instructions  ZIO XT- Long Term Monitor Instructions  Your physician has requested you wear a ZIO patch monitor for 14 days.  This is a single patch monitor. [employer] supplies one patch monitor per enrollment. Additional stickers are not available. Please do not apply patch if you will be having a Nuclear Stress Test,  Echocardiogram, Cardiac CT, MRI, or Chest Xray during the period you would be wearing the  monitor. The patch cannot be worn during these tests. You cannot remove and re-apply the  ZIO XT patch monitor.  Your ZIO patch monitor will be mailed 3 day USPS to your address on file. It may take 3-5 days  to receive your monitor after you have been enrolled.  Once you have received your monitor, please review the enclosed instructions. Your monitor  has already been registered assigning a specific monitor serial # to you.  Billing and Patient Assistance Program Information  We have supplied [employer] with any of your insurance information on file for billing purposes. [employer] offers a sliding scale Patient Assistance Program for patients that do not have  insurance, or whose insurance does not completely cover the cost of the ZIO monitor.  You must apply for the Patient Assistance Program to qualify for this discounted rate.  To apply, please  call [employer] at [phone], select option 4, select option 2, ask to apply for  Patient Assistance Program. Meredeth Ide will ask your household income, and how many people  are in your household. They will quote your out-of-pocket cost based on that information.  [employer] will also be able to set up a 91-month, interest-free payment plan if needed.  Applying the monitor   Shave hair from upper left chest.  Hold abrader disc by orange tab. Rub abrader in 40 strokes over the upper left chest as  indicated in your monitor instructions.  Clean area with 4 enclosed alcohol pads. Let dry.  Apply patch as indicated in monitor instructions. Patch will be placed under collarbone on left  side of chest with arrow pointing upward.  Rub patch adhesive wings for 2 minutes. Remove white label marked "1". Remove the white  label marked "2". Rub patch adhesive wings for 2 additional minutes.  While looking in a mirror, press and release button in center of patch. A small green light will  flash 3-4 times. This will be your only indicator that the monitor has been turned on.  Do not shower for the first 24 hours. You may shower after the first 24 hours.  Press the button if you feel a symptom. You will hear a small click. Record Date, Time and  Symptom in the Patient Logbook.  When you are ready to remove the patch, follow instructions on the last 2 pages of Patient  Logbook. Stick patch monitor onto the last page of Patient Logbook.  Place Patient Logbook in the blue and white box.  Use locking tab on box and tape box closed  securely. The blue and white box has prepaid postage on it. Please place it in the mailbox as  soon as possible. Your physician should have your test results approximately 7 days after the  monitor has been mailed back to [facility].  Call [facility] Customer Care at [phone] if you have questions regarding  your ZIO XT patch monitor. Call them immediately if you see an  orange light blinking on your  monitor.  If your monitor falls off in less than 4 days, contact our Monitor department at [phone].  If your monitor becomes loose or falls off after 4 days call [employer] at [phone] for  suggestions on securing your monitor     Signed, Jacolyn Reedy, Cordelia Poche  01/13/2021 8:19 AM    [facility] [address] [place], [place], [place]  [address] Phone: [phone]; Fax: [phone]

## 2021-01-13 ENCOUNTER — Ambulatory Visit (INDEPENDENT_AMBULATORY_CARE_PROVIDER_SITE_OTHER): Payer: BC Managed Care – PPO | Admitting: Physician Assistant

## 2021-01-13 ENCOUNTER — Ambulatory Visit (INDEPENDENT_AMBULATORY_CARE_PROVIDER_SITE_OTHER): Payer: BC Managed Care – PPO

## 2021-01-13 ENCOUNTER — Other Ambulatory Visit: Payer: Self-pay

## 2021-01-13 ENCOUNTER — Encounter: Payer: Self-pay | Admitting: Physician Assistant

## 2021-01-13 VITALS — BP 136/72 | HR 80 | Ht 63.0 in | Wt 340.2 lb

## 2021-01-13 DIAGNOSIS — R7303 Prediabetes: Secondary | ICD-10-CM

## 2021-01-13 DIAGNOSIS — I456 Pre-excitation syndrome: Secondary | ICD-10-CM

## 2021-01-13 DIAGNOSIS — R079 Chest pain, unspecified: Secondary | ICD-10-CM | POA: Diagnosis not present

## 2021-01-13 DIAGNOSIS — K582 Mixed irritable bowel syndrome: Secondary | ICD-10-CM

## 2021-01-13 DIAGNOSIS — I10 Essential (primary) hypertension: Secondary | ICD-10-CM | POA: Diagnosis not present

## 2021-01-13 DIAGNOSIS — Z86718 Personal history of other venous thrombosis and embolism: Secondary | ICD-10-CM

## 2021-01-13 DIAGNOSIS — R002 Palpitations: Secondary | ICD-10-CM

## 2021-01-13 DIAGNOSIS — E785 Hyperlipidemia, unspecified: Secondary | ICD-10-CM

## 2021-01-13 NOTE — Patient Instructions (Signed)
Medication Instructions:  Your physician recommends that you continue on your current medications as directed. Please refer to the Current Medication list given to you today.  *If you need a refill on your cardiac medications before your next appointment, please call your pharmacy*   Lab Work: None If you have labs (blood work) drawn today and your tests are completely normal, you will receive your results only by: MyChart Message (if you have MyChart) OR A paper copy in the mail If you have any lab test that is abnormal or we need to change your treatment, we will call you to review the results.   Testing/Procedures: Your physician has requested that you have a lexiscan myoview. For further information please visit [email or web address]. Please follow instruction sheet, as given.   Follow-Up: At [facility], you and your health needs are our priority.  As part of our continuing mission to provide you with exceptional heart care, we have created designated Provider Care Teams.  These Care Teams include your primary Cardiologist (physician) and Advanced Practice Providers (APPs -  Physician Assistants and Nurse Practitioners) who all work together to provide you with the care you need, when you need it.  We recommend signing up for the patient portal called "MyChart".  Sign up information is provided on this After Visit Summary.  MyChart is used to connect with patients for Virtual Visits (Telemedicine).  Patients are able to view lab/test results, encounter notes, upcoming appointments, etc.  Non-urgent messages can be sent to your provider as well.   To learn more about what you can do with MyChart, go to [email or web address].    Your next appointment:   1 year(s)  The format for your next appointment:   In Person  Provider:   Mark Skains, MD     Other Instructions  ZIO XT- Long Term Monitor Instructions  Your physician has requested you wear a ZIO patch monitor for 14  days.  This is a single patch monitor. [employer] supplies one patch monitor per enrollment. Additional stickers are not available. Please do not apply patch if you will be having a Nuclear Stress Test,  Echocardiogram, Cardiac CT, MRI, or Chest Xray during the period you would be wearing the  monitor. The patch cannot be worn during these tests. You cannot remove and re-apply the  ZIO XT patch monitor.  Your ZIO patch monitor will be mailed 3 day USPS to your address on file. It may take 3-5 days  to receive your monitor after you have been enrolled.  Once you have received your monitor, please review the enclosed instructions. Your monitor  has already been registered assigning a specific monitor serial # to you.  Billing and Patient Assistance Program Information  We have supplied [employer] with any of your insurance information on file for billing purposes. [employer] offers a sliding scale Patient Assistance Program for patients that do not have  insurance, or whose insurance does not completely cover the cost of the ZIO monitor.  You must apply for the Patient Assistance Program to qualify for this discounted rate.  To apply, please call [employer] at [phone], select option 4, select option 2, ask to apply for  Patient Assistance Program. [employer] will ask your household income, and how many people  are in your household. They will quote your out-of-pocket cost based on that information.  [employer] will also be able to set up a 12-month, interest-free payment plan if needed.  Applying the monitor   Shave hair from   upper left chest.  Hold abrader disc by orange tab. Rub abrader in 40 strokes over the upper left chest as  indicated in your monitor instructions.  Clean area with 4 enclosed alcohol pads. Let dry.  Apply patch as indicated in monitor instructions. Patch will be placed under collarbone on left  side of chest with arrow pointing upward.  Rub patch adhesive wings for 2 minutes.  Remove white label marked "1". Remove the white  label marked "2". Rub patch adhesive wings for 2 additional minutes.  While looking in a mirror, press and release button in center of patch. A small green light will  flash 3-4 times. This will be your only indicator that the monitor has been turned on.  Do not shower for the first 24 hours. You may shower after the first 24 hours.  Press the button if you feel a symptom. You will hear a small click. Record Date, Time and  Symptom in the Patient Logbook.  When you are ready to remove the patch, follow instructions on the last 2 pages of Patient  Logbook. Stick patch monitor onto the last page of Patient Logbook.  Place Patient Logbook in the blue and white box. Use locking tab on box and tape box closed  securely. The blue and white box has prepaid postage on it. Please place it in the mailbox as  soon as possible. Your physician should have your test results approximately 7 days after the  monitor has been mailed back to [employer].  Call [employer] Customer Care at [phone] if you have questions regarding  your ZIO XT patch monitor. Call them immediately if you see an orange light blinking on your  monitor.  If your monitor falls off in less than 4 days, contact our Monitor department at [phone].  If your monitor becomes loose or falls off after 4 days call [employer] at [phone] for  suggestions on securing your monitor    

## 2021-01-13 NOTE — Progress Notes (Unsigned)
Enrolled patient for a 14 day ZIo XT monitor to be mailed to patients home  Dr [name] to read

## 2021-01-16 DIAGNOSIS — R002 Palpitations: Secondary | ICD-10-CM | POA: Diagnosis not present

## 2021-01-16 DIAGNOSIS — R079 Chest pain, unspecified: Secondary | ICD-10-CM

## 2021-01-17 ENCOUNTER — Other Ambulatory Visit: Payer: Self-pay | Admitting: Cardiology

## 2021-01-19 ENCOUNTER — Other Ambulatory Visit: Payer: Self-pay

## 2021-01-19 ENCOUNTER — Encounter (INDEPENDENT_AMBULATORY_CARE_PROVIDER_SITE_OTHER): Payer: Self-pay | Admitting: Family Medicine

## 2021-01-19 ENCOUNTER — Ambulatory Visit (INDEPENDENT_AMBULATORY_CARE_PROVIDER_SITE_OTHER): Payer: BC Managed Care – PPO | Admitting: Family Medicine

## 2021-01-19 VITALS — BP 128/81 | HR 91 | Temp 98.0°F | Ht 63.0 in | Wt 334.0 lb

## 2021-01-19 DIAGNOSIS — Z6841 Body Mass Index (BMI) 40.0 and over, adult: Secondary | ICD-10-CM

## 2021-01-19 DIAGNOSIS — K5909 Other constipation: Secondary | ICD-10-CM | POA: Diagnosis not present

## 2021-01-19 DIAGNOSIS — E1169 Type 2 diabetes mellitus with other specified complication: Secondary | ICD-10-CM

## 2021-01-19 DIAGNOSIS — Z9189 Other specified personal risk factors, not elsewhere classified: Secondary | ICD-10-CM

## 2021-01-19 MED ORDER — METFORMIN HCL 500 MG PO TABS: ORAL_TABLET | ORAL | 0 refills | Status: DC

## 2021-01-19 MED ORDER — POLYETHYLENE GLYCOL 3350 17 GM/SCOOP PO POWD: ORAL | 1 refills | Status: AC
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
## 2021-01-21 ENCOUNTER — Telehealth (HOSPITAL_COMMUNITY): Payer: Self-pay

## 2021-01-21 NOTE — Telephone Encounter (Signed)
Detailed instructions left on the patient's answering machine. Asked to call back with any questions. Mia [name] EMTP 

## 2021-01-27 ENCOUNTER — Other Ambulatory Visit: Payer: Self-pay

## 2021-01-27 ENCOUNTER — Ambulatory Visit (HOSPITAL_COMMUNITY): Payer: BC Managed Care – PPO | Attending: Cardiology

## 2021-01-27 DIAGNOSIS — R002 Palpitations: Secondary | ICD-10-CM | POA: Diagnosis present

## 2021-01-27 DIAGNOSIS — R079 Chest pain, unspecified: Secondary | ICD-10-CM | POA: Insufficient documentation

## 2021-01-27 MED ORDER — TECHNETIUM TC 99M TETROFOSMIN IV KIT
31.6000 | PACK | Freq: Once | INTRAVENOUS | Status: AC | PRN
  Administered 2021-01-27: 31.6 via INTRAVENOUS
  Filled 2021-01-27: qty 32

## 2021-01-27 MED ORDER — REGADENOSON 0.4 MG/5ML IV SOLN
0.4000 mg | Freq: Once | INTRAVENOUS | Status: AC
  Administered 2021-01-27: 0.4 mg via INTRAVENOUS

## 2021-01-28 ENCOUNTER — Ambulatory Visit (HOSPITAL_COMMUNITY): Payer: BC Managed Care – PPO | Attending: Cardiology

## 2021-01-28 LAB — MYOCARDIAL PERFUSION IMAGING
LV dias vol: 78 mL (ref 46–106)
LV sys vol: 29 mL
Nuc Stress EF: 63 %
Peak HR: 93 {beats}/min
Rest HR: 75 {beats}/min
Rest Nuclear Isotope Dose: 31.4 mCi
SDS: 3
SRS: 0
SSS: 3
ST Depression (mm): 0 mm
Stress Nuclear Isotope Dose: 31.6 mCi
TID: 1.2

## 2021-01-28 MED ORDER — TECHNETIUM TC 99M TETROFOSMIN IV KIT
31.4000 | PACK | Freq: Once | INTRAVENOUS | Status: AC | PRN
  Administered 2021-01-28: 13:00:00 31.4 via INTRAVENOUS
  Filled 2021-01-28: qty 32

## 2021-01-29 ENCOUNTER — Telehealth: Payer: Self-pay

## 2021-01-29 NOTE — Telephone Encounter (Signed)
Pt is aware of results. I advised her to let us know if her symptoms worsened and we would consider additional testing.    Per Mia [name]:  Covering for [place] this week. Reviewed stress test with Dr. [name]. He reviewed images and he feels that study is "overall low risk. While requiring upright images, defect is cleared.  If symptoms worsen or become more worrisome, further cardiac testing may be warranted.  For now continue with current treatment strategy. " Mia [name], please let pt know that stress test is low risk and we will await her Zio monitor results for further review. If symptoms have worsened or persisted, let us know and we can consider additional testing.

## 2021-01-29 NOTE — Progress Notes (Signed)
Images reviewed.  Agree with low overall risk.  While requiring upright images, defect is cleared.  If symptoms worsen or become more worrisome, further cardiac testing may be warranted.  For now continue with current treatment strategy. Donato Schultz, MD

## 2021-02-02 ENCOUNTER — Encounter: Payer: Self-pay | Admitting: Internal Medicine

## 2021-02-02 ENCOUNTER — Ambulatory Visit: Payer: BC Managed Care – PPO | Admitting: Internal Medicine

## 2021-02-02 ENCOUNTER — Ambulatory Visit (INDEPENDENT_AMBULATORY_CARE_PROVIDER_SITE_OTHER): Payer: BC Managed Care – PPO | Admitting: Family Medicine

## 2021-02-02 ENCOUNTER — Other Ambulatory Visit: Payer: Self-pay

## 2021-02-02 VITALS — BP 140/80 | HR 74 | Temp 98.6°F | Ht 63.0 in | Wt 340.0 lb

## 2021-02-02 DIAGNOSIS — I10 Essential (primary) hypertension: Secondary | ICD-10-CM | POA: Diagnosis not present

## 2021-02-02 DIAGNOSIS — K5900 Constipation, unspecified: Secondary | ICD-10-CM

## 2021-02-02 DIAGNOSIS — E559 Vitamin D deficiency, unspecified: Secondary | ICD-10-CM | POA: Diagnosis not present

## 2021-02-02 DIAGNOSIS — R739 Hyperglycemia, unspecified: Secondary | ICD-10-CM

## 2021-02-02 DIAGNOSIS — T2121XA Burn of second degree of chest wall, initial encounter: Secondary | ICD-10-CM | POA: Insufficient documentation

## 2021-02-02 LAB — HEMOGLOBIN A1C: Hgb A1c MFr Bld: 6.3 % (ref 4.6–6.5)

## 2021-02-02 MED ORDER — LACTULOSE 10 GM/15ML PO SOLN: 30.0000 g | Freq: Two times a day (BID) | ORAL | 2 refills | Status: DC | PRN

## 2021-02-02 MED ORDER — SILVER SULFADIAZINE 1 % EX CREA: 1.0000 "application " | TOPICAL_CREAM | Freq: Every day | CUTANEOUS | 0 refills | Status: DC

## 2021-02-02 MED ORDER — AZITHROMYCIN 250 MG PO TABS: ORAL_TABLET | ORAL | 1 refills | Status: AC

## 2021-02-02 NOTE — Patient Instructions (Signed)
Please take all new medication as prescribed - the lactulose for constipation, and the antibiotic  Please continue all other medications as before, and refills have been done if requested.  Please have the pharmacy call with any other refills you may need.  Please continue your efforts at being more active, low cholesterol diet, and weight control.  Please keep your appointments with your specialists as you may have planned  Please go to the LAB at the blood drawing area for the tests to be done  You will be contacted by phone if any changes need to be made immediately.  Otherwise, you will receive a letter about your results with an explanation, but please check with MyChart first.  Please remember to sign up for MyChart if you have not done so, as this will be important to you in the future with finding out test results, communicating by private email, and scheduling acute appointments online when needed.  Please make an Appointment to return in 6 months, or sooner if needed  Ok to CANCEL the Feb 8 appt
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
## 2021-02-03 ENCOUNTER — Encounter: Payer: Self-pay | Admitting: Internal Medicine

## 2021-02-03 LAB — HEPATIC FUNCTION PANEL
ALT: 8 U/L (ref 0–35)
AST: 13 U/L (ref 0–37)
Albumin: 3.9 g/dL (ref 3.5–5.2)
Alkaline Phosphatase: 66 U/L (ref 39–117)
Bilirubin, Direct: 0.1 mg/dL (ref 0.0–0.3)
Total Bilirubin: 0.5 mg/dL (ref 0.2–1.2)
Total Protein: 7.1 g/dL (ref 6.0–8.3)

## 2021-02-03 LAB — LIPID PANEL
Cholesterol: 145 mg/dL (ref 0–200)
HDL: 37.8 mg/dL — ABNORMAL LOW (ref >39.00)
LDL Cholesterol: 87 mg/dL (ref 0–99)
NonHDL: 106.79
Total CHOL/HDL Ratio: 4
Triglycerides: 97 mg/dL (ref 0.0–149.0)
VLDL: 19.4 mg/dL (ref 0.0–40.0)

## 2021-02-03 LAB — BASIC METABOLIC PANEL
BUN: 21 mg/dL (ref 6–23)
CO2: 27 mEq/L (ref 19–32)
Calcium: 10.3 mg/dL (ref 8.4–10.5)
Chloride: 105 mEq/L (ref 96–112)
Creatinine, Ser: 0.85 mg/dL (ref 0.40–1.20)
GFR: 72.94 mL/min (ref >60.00)
Glucose, Bld: 81 mg/dL (ref 70–99)
Potassium: 4.1 mEq/L (ref 3.5–5.1)
Sodium: 140 mEq/L (ref 135–145)

## 2021-02-03 LAB — VITAMIN D 25 HYDROXY (VIT D DEFICIENCY, FRACTURES): VITD: 32.83 ng/mL (ref 30.00–100.00)

## 2021-02-03 NOTE — Assessment & Plan Note (Signed)
BP Readings from Last 3 Encounters:  02/02/21 140/80  01/19/21 128/81  01/13/21 136/72   Stable, pt to continue medical treatment coreg, losartan

## 2021-02-03 NOTE — Assessment & Plan Note (Signed)
Uncontrolled, for add lactulose prn,  to f/u any worsening symptoms or concerns

## 2021-02-03 NOTE — Assessment & Plan Note (Signed)
For zpack , silvadene cream,  to f/u any worsening symptoms or concerns

## 2021-02-03 NOTE — Assessment & Plan Note (Signed)
Lab Results  Component Value Date   HGBA1C 6.3 02/02/2021   Stable, pt to continue current medical treatment metformin

## 2021-02-03 NOTE — Assessment & Plan Note (Signed)
Last vitamin D Lab Results  Component Value Date   VD25OH 32.83 02/02/2021   Low, to start oral replacement  

## 2021-02-11 ENCOUNTER — Ambulatory Visit: Payer: BC Managed Care – PPO | Admitting: Internal Medicine

## 2021-02-17 ENCOUNTER — Ambulatory Visit: Payer: BC Managed Care – PPO | Admitting: Gastroenterology

## 2021-02-23 ENCOUNTER — Other Ambulatory Visit: Payer: Self-pay

## 2021-02-23 ENCOUNTER — Encounter (INDEPENDENT_AMBULATORY_CARE_PROVIDER_SITE_OTHER): Payer: Self-pay | Admitting: Family Medicine

## 2021-02-23 ENCOUNTER — Ambulatory Visit (INDEPENDENT_AMBULATORY_CARE_PROVIDER_SITE_OTHER): Payer: BC Managed Care – PPO | Admitting: Family Medicine

## 2021-02-23 VITALS — BP 146/82 | HR 72 | Temp 97.8°F | Ht 63.0 in | Wt 332.0 lb

## 2021-02-23 DIAGNOSIS — E669 Obesity, unspecified: Secondary | ICD-10-CM | POA: Diagnosis not present

## 2021-02-23 DIAGNOSIS — E1169 Type 2 diabetes mellitus with other specified complication: Secondary | ICD-10-CM

## 2021-02-23 DIAGNOSIS — K5909 Other constipation: Secondary | ICD-10-CM

## 2021-02-23 DIAGNOSIS — Z6841 Body Mass Index (BMI) 40.0 and over, adult: Secondary | ICD-10-CM

## 2021-02-23 DIAGNOSIS — Z7985 Long-term (current) use of injectable non-insulin antidiabetic drugs: Secondary | ICD-10-CM

## 2021-02-23 DIAGNOSIS — K2960 Other gastritis without bleeding: Secondary | ICD-10-CM

## 2021-02-23 NOTE — Progress Notes (Signed)
Chief Complaint:   OBESITY Mia [name] is here to discuss her progress with her obesity treatment plan along with follow-up of her obesity related diagnoses. Mia [name] is on the Category 2 Plan with breakfast options and states she is following her eating plan approximately 15% of the time. Mia [name] states she is walking more.  Today's visit was #: 5 Starting weight: 341 lbs Starting date: 11/24/2020 Today's weight: 332 lbs Today's date: 02/23/2021 Total lbs lost to date: 9 Total lbs lost since last in-office visit: 2  Interim History: We increased pt's Metformin at last OV and she is tolerating it well with no GI side effects or increased sxs. Mia [name] is still skipping meals/foods.  Subjective:   1. Type 2 diabetes mellitus with other specified complication, without long-term current use of insulin (HCC) We increased Metformin at last OV and pt has no increase in GI distress or cramping. Metformin is helping with cravings on some days but not others.  2. Other specified gastritis, presence of bleeding unspecified, unspecified chronicity Mia [name] is skipping a lot of meals due to stomach cramping. She says it doesn't matter what she eats, she has pain. Pt had a GI appt recently but missed it.   3. Other constipation Mia [name] reports she went 8 days without a BM. She is taking 2 packs of Miralax QD and 3 fiber capsules per day. She had a BM 2 days ago. Pt drinks 6 bottles of water per day.  Assessment/Plan:   1. Type 2 diabetes mellitus with other specified complication, without long-term current use of insulin (HCC) Continue Metformin BID. Per pt, PCP gave her a 90 day supply recently. Increase protein and decrease simple carbs by following meal plan more closely.  2. Other specified gastritis, presence of bleeding unspecified, unspecified chronicity F/u with GI in 2 weeks (has appt). Try not to skip meals. Treatment plan per GI.  3. Other constipation Continue to eat  adequate fiber and drink water daily. Continue Miralax until OV with GI. Further management per specialist.  4. Obesity with current BMI of [place] is currently in the action stage of change. As such, her goal is to continue with weight loss efforts. She has agreed to the Category 2 Plan with breakfast options.   Exercise goals: All adults should avoid inactivity. Some physical activity is better than none, and adults who participate in any amount of physical activity gain some health benefits.  Behavioral modification strategies: increasing lean protein intake, no skipping meals, and avoiding temptations.  Mia [name] has agreed to follow-up with our clinic in 3-4 weeks. She was informed of the importance of frequent follow-up visits to maximize her success with intensive lifestyle modifications for her multiple health conditions.   Objective:   Blood pressure (!) 146/82, pulse 72, temperature 97.8 F (36.6 C), temperature source Oral, height 5\' 3"  (1.6 m), weight (!) 332 lb (150.6 kg), SpO2 96 %. Body mass index is 58.81 kg/m.  General: Cooperative, alert, well developed, in no acute distress. HEENT: Conjunctivae and lids unremarkable. Cardiovascular: Regular rhythm.  Lungs: Normal work of breathing. Neurologic: No focal deficits.   Lab Results  Component Value Date   CREATININE 0.85 02/02/2021   BUN 21 02/02/2021   NA 140 02/02/2021   K 4.1 02/02/2021   CL 105 02/02/2021   CO2 27 02/02/2021   Lab Results  Component Value Date   ALT 8 02/02/2021   AST 13 02/02/2021   ALKPHOS 66 02/02/2021   BILITOT 0.5 02/02/2021  Lab Results  Component Value Date   HGBA1C 6.3 02/02/2021   HGBA1C 6.2 (H) 11/24/2020   HGBA1C 6.5 08/11/2020   HGBA1C 6.7 (H) 02/12/2020   HGBA1C 6.5 (H) 08/09/2019   Lab Results  Component Value Date   INSULIN 13.8 11/24/2020   Lab Results  Component Value Date   TSH 1.220 11/24/2020   Lab Results  Component Value Date   CHOL 145 02/02/2021    HDL 37.80 (L) 02/02/2021   LDLCALC 87 02/02/2021   LDLDIRECT 148.9 12/11/2012   TRIG 97.0 02/02/2021   CHOLHDL 4 02/02/2021   Lab Results  Component Value Date   VD25OH 32.83 02/02/2021   VD25OH 31.6 11/24/2020   VD25OH 24.63 (L) 08/11/2020   Lab Results  Component Value Date   WBC 7.2 11/24/2020   HGB 13.8 11/24/2020   HCT 43.6 11/24/2020   MCV 84 11/24/2020   PLT 242 11/24/2020   Lab Results  Component Value Date   IRON 89 07/13/2018   TIBC 243 (L) 02/08/2010   FERRITIN 287.3 05/24/2014    Attestation Statements:   Reviewed by clinician on day of visit: allergies, medications, problem list, medical history, surgical history, family history, social history, and previous encounter notes.  Coral Ceo, CMA, am acting as transcriptionist for [employer], DO.  I have reviewed the above documentation for accuracy and completeness, and I agree with the above. Marjory Sneddon, D.O.  The [place] was signed into law in 2016 which includes the topic of electronic health records.  This provides immediate access to information in MyChart.  This includes consultation notes, operative notes, office notes, lab results and pathology reports.  If you have any questions about what you read please let us know at your next visit so we can discuss your concerns and take corrective action if need be.  We are right here with you.

## 2021-03-05 ENCOUNTER — Other Ambulatory Visit: Payer: Self-pay | Admitting: Cardiology

## 2021-03-05 ENCOUNTER — Other Ambulatory Visit (INDEPENDENT_AMBULATORY_CARE_PROVIDER_SITE_OTHER): Payer: Self-pay | Admitting: Family Medicine

## 2021-03-05 DIAGNOSIS — Z5181 Encounter for therapeutic drug level monitoring: Secondary | ICD-10-CM

## 2021-03-05 DIAGNOSIS — I456 Pre-excitation syndrome: Secondary | ICD-10-CM

## 2021-03-05 DIAGNOSIS — I10 Essential (primary) hypertension: Secondary | ICD-10-CM

## 2021-03-05 DIAGNOSIS — E1169 Type 2 diabetes mellitus with other specified complication: Secondary | ICD-10-CM

## 2021-03-05 DIAGNOSIS — I5032 Chronic diastolic (congestive) heart failure: Secondary | ICD-10-CM

## 2021-03-05 NOTE — Telephone Encounter (Signed)
Dr.Opalski ?

## 2021-03-16 ENCOUNTER — Ambulatory Visit (INDEPENDENT_AMBULATORY_CARE_PROVIDER_SITE_OTHER): Payer: BC Managed Care – PPO | Admitting: Family Medicine

## 2021-03-17 ENCOUNTER — Ambulatory Visit: Payer: BC Managed Care – PPO | Admitting: Gastroenterology

## 2021-03-26 ENCOUNTER — Encounter (INDEPENDENT_AMBULATORY_CARE_PROVIDER_SITE_OTHER): Payer: Self-pay

## 2021-03-26 ENCOUNTER — Encounter (INDEPENDENT_AMBULATORY_CARE_PROVIDER_SITE_OTHER): Payer: Self-pay | Admitting: Family Medicine

## 2021-03-26 ENCOUNTER — Encounter (INDEPENDENT_AMBULATORY_CARE_PROVIDER_SITE_OTHER): Payer: BC Managed Care – PPO | Admitting: Family Medicine

## 2021-03-26 ENCOUNTER — Other Ambulatory Visit: Payer: Self-pay

## 2021-04-11 NOTE — Progress Notes (Signed)
Pt did not show for her APPT today but this encounter was opened by staff personnel by mistake. ?

## 2021-04-13 ENCOUNTER — Other Ambulatory Visit: Payer: Self-pay | Admitting: Cardiology

## 2021-04-13 DIAGNOSIS — Z5181 Encounter for therapeutic drug level monitoring: Secondary | ICD-10-CM

## 2021-04-13 DIAGNOSIS — I10 Essential (primary) hypertension: Secondary | ICD-10-CM

## 2021-04-13 DIAGNOSIS — I456 Pre-excitation syndrome: Secondary | ICD-10-CM

## 2021-04-13 DIAGNOSIS — I5032 Chronic diastolic (congestive) heart failure: Secondary | ICD-10-CM

## 2021-04-13 NOTE — Telephone Encounter (Signed)
Prescription refill request for Xarelto received.  ?Indication: DVT/PE ?Last office visit: 01/13/21 [facility]) ?Weight: 150.6kg ?Age: 66 ?Scr: 0.85 (02/02/21) ?CrCl: 161.106ml/min ? ?Appropriate dose and refill sent to requested pharmacy.  ?

## 2021-04-13 NOTE — Telephone Encounter (Signed)
?*  STAT* If patient is at the pharmacy, call can be transferred to refill team. ? ? ?1. Which medications need to be refilled? (please list name of each medication and dose if known)  ?carvedilol (COREG) 6.25 MG tablet ?XARELTO 20 MG TABS tablet ?2. Which pharmacy/location (including street and city if local pharmacy) is medication to be sent to?  ?[id number] - [place], [place] - [address] AT [facility] OF [address] ?3. Do they need a 30 day or 90 day supply? 90 day ? ?Pt states that she is completely out of these meds. Please advise ? ?

## 2021-08-04 ENCOUNTER — Encounter: Payer: Self-pay | Admitting: Internal Medicine

## 2021-08-04 ENCOUNTER — Other Ambulatory Visit: Payer: Self-pay | Admitting: Internal Medicine

## 2021-08-04 ENCOUNTER — Ambulatory Visit: Payer: BC Managed Care – PPO | Admitting: Internal Medicine

## 2021-08-04 VITALS — BP 158/90 | HR 81 | Temp 98.1°F | Ht 63.0 in | Wt 342.0 lb

## 2021-08-04 DIAGNOSIS — Z5181 Encounter for therapeutic drug level monitoring: Secondary | ICD-10-CM

## 2021-08-04 DIAGNOSIS — R739 Hyperglycemia, unspecified: Secondary | ICD-10-CM | POA: Diagnosis not present

## 2021-08-04 DIAGNOSIS — R42 Dizziness and giddiness: Secondary | ICD-10-CM | POA: Diagnosis not present

## 2021-08-04 DIAGNOSIS — E785 Hyperlipidemia, unspecified: Secondary | ICD-10-CM | POA: Diagnosis not present

## 2021-08-04 DIAGNOSIS — Z0001 Encounter for general adult medical examination with abnormal findings: Secondary | ICD-10-CM | POA: Diagnosis not present

## 2021-08-04 DIAGNOSIS — E559 Vitamin D deficiency, unspecified: Secondary | ICD-10-CM

## 2021-08-04 DIAGNOSIS — I10 Essential (primary) hypertension: Secondary | ICD-10-CM

## 2021-08-04 DIAGNOSIS — E538 Deficiency of other specified B group vitamins: Secondary | ICD-10-CM | POA: Diagnosis not present

## 2021-08-04 DIAGNOSIS — I5032 Chronic diastolic (congestive) heart failure: Secondary | ICD-10-CM

## 2021-08-04 DIAGNOSIS — N179 Acute kidney failure, unspecified: Secondary | ICD-10-CM

## 2021-08-04 DIAGNOSIS — J309 Allergic rhinitis, unspecified: Secondary | ICD-10-CM

## 2021-08-04 LAB — BASIC METABOLIC PANEL
BUN: 24 mg/dL — ABNORMAL HIGH (ref 6–23)
CO2: 30 mEq/L (ref 19–32)
Calcium: 10.7 mg/dL — ABNORMAL HIGH (ref 8.4–10.5)
Chloride: 104 mEq/L (ref 96–112)
Creatinine, Ser: 1.77 mg/dL — ABNORMAL HIGH (ref 0.40–1.20)
GFR: 30.14 mL/min — ABNORMAL LOW (ref >60.00)
Glucose, Bld: 90 mg/dL (ref 70–99)
Potassium: 4.2 mEq/L (ref 3.5–5.1)
Sodium: 142 mEq/L (ref 135–145)

## 2021-08-04 LAB — CBC WITH DIFFERENTIAL/PLATELET
Basophils Absolute: 0.1 10*3/uL (ref 0.0–0.1)
Basophils Relative: 0.8 % (ref 0.0–3.0)
Eosinophils Absolute: 0.1 10*3/uL (ref 0.0–0.7)
Eosinophils Relative: 0.8 % (ref 0.0–5.0)
HCT: 43.3 % (ref 36.0–46.0)
Hemoglobin: 14.1 g/dL (ref 12.0–15.0)
Lymphocytes Relative: 18.8 % (ref 12.0–46.0)
Lymphs Abs: 1.8 10*3/uL (ref 0.7–4.0)
MCHC: 32.5 g/dL (ref 30.0–36.0)
MCV: 85.6 fl (ref 78.0–100.0)
Monocytes Absolute: 0.9 10*3/uL (ref 0.1–1.0)
Monocytes Relative: 9.9 % (ref 3.0–12.0)
Neutro Abs: 6.7 10*3/uL (ref 1.4–7.7)
Neutrophils Relative %: 69.7 % (ref 43.0–77.0)
Platelets: 212 10*3/uL (ref 150.0–400.0)
RBC: 5.06 Mil/uL (ref 3.87–5.11)
RDW: 14.5 % (ref 11.5–15.5)
WBC: 9.6 10*3/uL (ref 4.0–10.5)

## 2021-08-04 LAB — LIPID PANEL
Cholesterol: 151 mg/dL (ref 0–200)
HDL: 40.8 mg/dL (ref >39.00)
LDL Cholesterol: 93 mg/dL (ref 0–99)
NonHDL: 110.39
Total CHOL/HDL Ratio: 4
Triglycerides: 88 mg/dL (ref 0.0–149.0)
VLDL: 17.6 mg/dL (ref 0.0–40.0)

## 2021-08-04 LAB — URINALYSIS, ROUTINE W REFLEX MICROSCOPIC
Bilirubin Urine: NEGATIVE
Ketones, ur: NEGATIVE
Nitrite: NEGATIVE
Specific Gravity, Urine: <=1.005 — AB (ref 1.000–1.030)
Total Protein, Urine: NEGATIVE
Urine Glucose: NEGATIVE
Urobilinogen, UA: 0.2 (ref 0.0–1.0)
pH: 6.5 (ref 5.0–8.0)

## 2021-08-04 LAB — HEPATIC FUNCTION PANEL
ALT: 10 U/L (ref 0–35)
AST: 16 U/L (ref 0–37)
Albumin: 4 g/dL (ref 3.5–5.2)
Alkaline Phosphatase: 76 U/L (ref 39–117)
Bilirubin, Direct: 0.1 mg/dL (ref 0.0–0.3)
Total Bilirubin: 1 mg/dL (ref 0.2–1.2)
Total Protein: 7.5 g/dL (ref 6.0–8.3)

## 2021-08-04 LAB — TSH: TSH: 1.33 u[IU]/mL (ref 0.35–5.50)

## 2021-08-04 LAB — VITAMIN B12: Vitamin B-12: 287 pg/mL (ref 211–911)

## 2021-08-04 LAB — HEMOGLOBIN A1C: Hgb A1c MFr Bld: 6.4 % (ref 4.6–6.5)

## 2021-08-04 LAB — VITAMIN D 25 HYDROXY (VIT D DEFICIENCY, FRACTURES): VITD: 18.56 ng/mL — ABNORMAL LOW (ref 30.00–100.00)

## 2021-08-04 MED ORDER — CYCLOBENZAPRINE HCL 5 MG PO TABS: ORAL_TABLET | ORAL | 1 refills | Status: DC

## 2021-08-04 MED ORDER — MECLIZINE HCL 12.5 MG PO TABS: 12.5000 mg | ORAL_TABLET | Freq: Three times a day (TID) | ORAL | 1 refills | Status: DC | PRN

## 2021-08-04 MED ORDER — CARVEDILOL 6.25 MG PO TABS: ORAL_TABLET | ORAL | 3 refills | Status: DC

## 2021-08-04 MED ORDER — SPIRONOLACTONE 25 MG PO TABS: ORAL_TABLET | ORAL | 3 refills | Status: DC

## 2021-08-04 MED ORDER — ATORVASTATIN CALCIUM 80 MG PO TABS: 80.0000 mg | ORAL_TABLET | Freq: Every day | ORAL | 3 refills | Status: DC

## 2021-08-04 NOTE — Assessment & Plan Note (Signed)
Last vitamin D Lab Results  Component Value Date   VD25OH 32.83 02/02/2021   Low, to start oral replacement

## 2021-08-04 NOTE — Progress Notes (Unsigned)
Chief Complaint:: wellness exam and Follow-up  Htn, ibs, allergies with vertigo       HPI:  Mia [name] is a 66 y.o. female here for wellness exam; plans to call for pap soon, will have shingrix at the local pharmacy, declines colonoscopy, o/w up to date                        Also out of coreg , aldactone and lipitor x 2 mo.  Missed her GI referred appt due to car trouble but plans to reschedule for IBS concerns.  Pt denies chest pain, increased sob or doe, wheezing, orthopnea, PND, increased LE swelling, palpitations, dizziness or syncope.   Pt denies polydipsia, polyuria, or new focal neuro s/s.    Pt denies fever, wt loss, night sweats, loss of appetite, or other constitutional symptoms   Has not been able to lose wt, had to give up the gym due to caring for husband, and car repairs. Has been out of BP meds for several weeks.  Does have several wks ongoing nasal allergy symptoms with clearish congestion, itch and sneezing, without fever, pain, ST, cough, swelling or wheezing, but has had also vertigo mild intermittent but lasting over 10 min per episode Wt Readings from Last 3 Encounters:  08/04/21 (!) 342 lb (155.1 kg)  02/23/21 (!) 332 lb (150.6 kg)  02/02/21 (!) 340 lb (154.2 kg)   BP Readings from Last 3 Encounters:  08/04/21 (!) 158/90  02/23/21 (!) 146/82  02/02/21 140/80   Immunization History  Administered Date(s) Administered   PFIZER Comirnaty(Gray Top)Covid-19 Tri-Sucrose Vaccine 04/06/2019, 04/28/2019   Tdap 05/22/2014   Health Maintenance Due  Topic Date Due   Diabetic kidney evaluation - Urine ACR  08/11/2021      Past Medical History:  Diagnosis Date   Allergic rhinitis, cause unspecified 04/29/2011   Anemia    Anemia, unspecified 04/23/2011   Back pain    Bilateral swelling of feet and ankles    Chronic anticoagulation    Chronic headaches    Colon polyps 05/2009   Constipation    Diverticulosis    DVT (deep venous thrombosis) (HCC)    right  upper extremitiy   GERD (gastroesophageal reflux disease) 11/10/2011   Gout 04/22/2010   H/O blood clots    H/O: GI bleed    Hyperlipidemia    Hyperplastic colon polyp    Hypertension    IBS (irritable bowel syndrome)    Infected prosthetic knee joint (HCC)    Joint pain    Migraine 04/29/2011   Morbid obesity (HCC)    Nonsmoker    Other fatigue    Pulmonary embolism (HCC) 2010   following right knee replacement   Shortness of breath    SOBOE (shortness of breath on exertion)    Vitamin D deficiency    WPW (Wolff-Parkinson-White syndrome)    Past Surgical History:  Procedure Laterality Date   CHOLECYSTECTOMY     TOTAL KNEE ARTHROPLASTY Right 2010   followed by I & D for infection   TUBAL LIGATION      reports that she has never smoked. She has never used smokeless tobacco. She reports that she does not drink alcohol and does not use drugs. family history includes Colon cancer in an other family member; Coronary artery disease in an other family member; Diabetes in her father; Heart Problems in her mother; Heart disease in her mother and  another family member; Hypertension in her brother, sister, sister, and sister; Pancreatic cancer in her sister; Throat cancer in her father. Allergies  Allergen Reactions   Crestor [Rosuvastatin Calcium] Other (See Comments)    Chest pain   Amlodipine Other (See Comments)    Edema,ha   Piperacillin Sod-Tazobactam So Other (See Comments)    REACTION: "UNSURE"   Ultram [Tramadol] Nausea Only   Current Outpatient Medications on File Prior to Visit  Medication Sig Dispense Refill   Cholecalciferol (DIALYVITE VITAMIN D 5000) 125 MCG (5000 UT) capsule Take 1 capsule (5,000 Units total) by mouth daily. 30 capsule 0   lactulose (CHRONULAC) 10 GM/15ML solution Take 45 mLs (30 g total) by mouth 2 (two) times daily as needed for mild constipation. 236 mL 2   losartan (COZAAR) 100 MG tablet TAKE 1 TABLET BY MOUTH DAILY 90 tablet 3   metFORMIN  (GLUCOPHAGE) 500 MG tablet 1 po with lunch daily and 1 po with dinner 60 tablet 0   polyethylene glycol powder (GLYCOLAX/MIRALAX) 17 GM/SCOOP powder Take twice daily ( 17gr) until stooling regular, then once daily 3350 g 1   XARELTO 20 MG TABS tablet TAKE 1 TABLET(20 MG) BY MOUTH DAILY WITH SUPPER 30 tablet 11   No current facility-administered medications on file prior to visit.        ROS:  All others reviewed and negative.  Objective        PE:  BP (!) 158/90 (BP Location: Left Arm, Patient Position: Sitting, Cuff Size: Large)   Pulse 81   Temp 98.1 F (36.7 C) (Oral)   Ht 5\' 3"  (1.6 m)   Wt (!) 342 lb (155.1 kg)   SpO2 95%   BMI 60.58 kg/m                 Constitutional: Pt appears in NAD               HENT: Head: NCAT.                Right Ear: External ear normal.                 Left Ear: External ear normal.  Bilat tm's with mild erythema.  Max sinus areas non tender.  Pharynx with mild erythema, no exudate               Eyes: . Pupils are equal, round, and reactive to light. Conjunctivae and EOM are normal               Nose: without d/c or deformity               Neck: Neck supple. Gross normal ROM               Cardiovascular: Normal rate and regular rhythm.                 Pulmonary/Chest: Effort normal and breath sounds without rales or wheezing.                Abd:  Soft, NT, ND, + BS, no organomegaly               Neurological: Pt is alert. At baseline orientation, motor grossly intact               Skin: Skin is warm. No rashes, no other new lesions, LE edema - none               Psychiatric:  Pt behavior is normal without agitation   Micro: none  Cardiac tracings I have personally interpreted today:  none  Pertinent Radiological findings (summarize): none   Lab Results  Component Value Date   WBC 9.6 08/04/2021   HGB 14.1 08/04/2021   HCT 43.3 08/04/2021   PLT 212.0 08/04/2021   GLUCOSE 90 08/04/2021   CHOL 151 08/04/2021   TRIG 88.0 08/04/2021   HDL  40.80 08/04/2021   LDLDIRECT 148.9 12/11/2012   LDLCALC 93 08/04/2021   ALT 10 08/04/2021   AST 16 08/04/2021   NA 142 08/04/2021   K 4.2 08/04/2021   CL 104 08/04/2021   CREATININE 1.77 (H) 08/04/2021   BUN 24 (H) 08/04/2021   CO2 30 08/04/2021   TSH 1.33 08/04/2021   INR 2.8 03/28/2018   HGBA1C 6.4 08/04/2021   MICROALBUR <0.7 08/11/2020   Assessment/Plan:  ANISIA LEIJA is a 66 y.o. Black or African American [2] female with  has a past medical history of Allergic rhinitis, cause unspecified (04/29/2011), Anemia, Anemia, unspecified (04/23/2011), Back pain, Bilateral swelling of feet and ankles, Chronic anticoagulation, Chronic headaches, Colon polyps (05/2009), Constipation, Diverticulosis, DVT (deep venous thrombosis) (HCC), GERD (gastroesophageal reflux disease) (11/10/2011), Gout (04/22/2010), H/O blood clots, H/O: GI bleed, Hyperlipidemia, Hyperplastic colon polyp, Hypertension, IBS (irritable bowel syndrome), Infected prosthetic knee joint (HCC), Joint pain, Migraine (04/29/2011), Morbid obesity (HCC), Nonsmoker, Other fatigue, Pulmonary embolism (HCC) (2010), Shortness of breath, SOBOE (shortness of breath on exertion), Vitamin D deficiency, and WPW (Wolff-Parkinson-White syndrome).  Vitamin D deficiency Last vitamin D Lab Results  Component Value Date   VD25OH 32.83 02/02/2021   Low, to start oral replacement   Dyslipidemia Lab Results  Component Value Date   LDLCALC 87 02/02/2021   uncontrolled, pt to restart lipitor 80 mg qd, and lower chol diet  HTN (hypertension) BP Readings from Last 3 Encounters:  08/04/21 (!) 188/96  02/23/21 (!) 146/82  02/02/21 140/80   Uncontrolled due to not taking meds,  pt to restart medical treatment coreg 6.25 bid, aldactone 25 qd, and losartan 100 qd    Encounter for well adult exam with abnormal findings Age and sex appropriate education and counseling updated with regular exercise and diet Referrals for preventative  services - plans to call for pap and colonoscopy soon Immunizations addressed - for shingrix at local pharmacy Smoking counseling  - none needed Evidence for depression or other mood disorder - none significant Most recent labs reviewed. I have personally reviewed and have noted: 1) the patient's medical and social history 2) The patient's current medications and supplements 3) The patient's height, weight, and BMI have been recorded in the chart   Allergic rhinitis Mild to mod uncontrolled, for otc allegra and nasacort,  to f/u any worsening symptoms or concerns  Hyperglycemia Lab Results  Component Value Date   HGBA1C 6.4 08/04/2021   Stable, pt to continue current medical treatment metformin 500 bid   Vertigo Mild to mod, for meclizine prn,  to f/u any worsening symptoms or concerns  Followup: No follow-ups on file.  Oliver Barre, MD 08/05/2021 1:35 PM [facility]

## 2021-08-04 NOTE — Assessment & Plan Note (Signed)
Lab Results  Component Value Date   LDLCALC 87 02/02/2021   uncontrolled, pt to restart lipitor 80 mg qd, and lower chol diet

## 2021-08-04 NOTE — Patient Instructions (Addendum)
Please put the MyChart app on your phone;   Your Username is [email or web address], and the Hattie Perch is [id number]  Please call the Gastroenterology office to reschedule your appt that you had to miss.  Ok to take OTC Allegra and Nasacort for your allergies  Please take all new medication as prescribed  - the meclizine as needed for you vertigo  Please continue all other medications as before, and refills have been done if requested.  Please have the pharmacy call with any other refills you may need.  Please continue your efforts at being more active, low cholesterol diet, and weight control.  You are otherwise up to date with prevention measures today.  Please keep your appointments with your specialists as you may have planned  Please go to the LAB at the blood drawing area for the tests to be done  You will be contacted by phone if any changes need to be made immediately.  Otherwise, you will receive a letter about your results with an explanation, but please check with MyChart first.  Please remember to sign up for MyChart if you have not done so, as this will be important to you in the future with finding out test results, communicating by private email, and scheduling acute appointments online when needed.  Please make an Appointment to return in 6 months, or sooner if needed

## 2021-08-04 NOTE — Assessment & Plan Note (Signed)
BP Readings from Last 3 Encounters:  08/04/21 (!) 188/96  02/23/21 (!) 146/82  02/02/21 140/80   Uncontrolled due to not taking meds,  pt to restart medical treatment coreg 6.25 bid, aldactone 25 qd, and losartan 100 qd

## 2021-08-05 NOTE — Assessment & Plan Note (Signed)
Mild to mod uncontrolled, for otc allegra and nasacort,  to f/u any worsening symptoms or concerns

## 2021-08-05 NOTE — Assessment & Plan Note (Signed)
Mild to mod, for meclizine prn, to f/u any worsening symptoms or concerns 

## 2021-08-05 NOTE — Assessment & Plan Note (Signed)
Lab Results  Component Value Date   HGBA1C 6.4 08/04/2021   Stable, pt to continue current medical treatment metformin 500 bid

## 2021-08-05 NOTE — Assessment & Plan Note (Signed)
Age and sex appropriate education and counseling updated with regular exercise and diet Referrals for preventative services - plans to call for pap and colonoscopy soon Immunizations addressed - for shingrix at local pharmacy Smoking counseling  - none needed Evidence for depression or other mood disorder - none significant Most recent labs reviewed. I have personally reviewed and have noted: 1) the patient's medical and social history 2) The patient's current medications and supplements 3) The patient's height, weight, and BMI have been recorded in the chart

## 2021-08-07 ENCOUNTER — Ambulatory Visit
Admission: RE | Admit: 2021-08-07 | Discharge: 2021-08-07 | Disposition: A | Discharge status: Home / Self Care | Payer: BC Managed Care – PPO | Source: Ambulatory Visit | Attending: Internal Medicine | Admitting: Internal Medicine

## 2021-08-07 DIAGNOSIS — N179 Acute kidney failure, unspecified: Secondary | ICD-10-CM

## 2021-08-10 ENCOUNTER — Telehealth: Payer: Self-pay

## 2021-08-10 DIAGNOSIS — N2889 Other specified disorders of kidney and ureter: Secondary | ICD-10-CM

## 2021-08-10 NOTE — Telephone Encounter (Signed)
Ok the referral is done 

## 2021-08-10 NOTE — Telephone Encounter (Signed)
Results reviewed with patient. Patient wants to proceed with urology referral

## 2021-08-10 NOTE — Telephone Encounter (Signed)
-----   Message from Corwin Levins, MD sent at 08/07/2021  8:14 PM EDT ----- The test results show that your current treatment is OK, as the tests are stable , except there is a somewhat concerning spot to the left kidney, only because it wasn't seen well.  We should plan to have you back in 6 months if you do not already have an appt, to consider having a follow up test to check the left kidney.   Alternatively, if you like, we can refer to urology and let them follow this for you as well.  Please let me know if you would rather have urology referral now.   Staff to please inform pt, and let me know if pt would rather see urology soon, or f/u with me at 6 month, thanks

## 2021-08-11 NOTE — Telephone Encounter (Signed)
Called pt, made aware of referral that was sent in for her and to expect a call from then

## 2021-08-12 ENCOUNTER — Encounter (INDEPENDENT_AMBULATORY_CARE_PROVIDER_SITE_OTHER): Payer: Self-pay

## 2021-10-06 ENCOUNTER — Other Ambulatory Visit: Payer: Self-pay | Admitting: Internal Medicine

## 2021-10-06 DIAGNOSIS — Z1231 Encounter for screening mammogram for malignant neoplasm of breast: Secondary | ICD-10-CM

## 2021-10-09 ENCOUNTER — Ambulatory Visit: Payer: BC Managed Care – PPO | Admitting: Internal Medicine

## 2021-10-09 VITALS — BP 136/74 | HR 72 | Temp 98.2°F | Ht 63.0 in | Wt 343.0 lb

## 2021-10-09 DIAGNOSIS — T161XXA Foreign body in right ear, initial encounter: Secondary | ICD-10-CM

## 2021-10-09 DIAGNOSIS — I10 Essential (primary) hypertension: Secondary | ICD-10-CM

## 2021-10-09 DIAGNOSIS — H9191 Unspecified hearing loss, right ear: Secondary | ICD-10-CM

## 2021-10-09 DIAGNOSIS — H9201 Otalgia, right ear: Secondary | ICD-10-CM | POA: Diagnosis not present

## 2021-10-09 DIAGNOSIS — T169XXA Foreign body in ear, unspecified ear, initial encounter: Secondary | ICD-10-CM | POA: Insufficient documentation

## 2021-10-09 MED ORDER — AZITHROMYCIN 250 MG PO TABS: ORAL_TABLET | ORAL | 1 refills | Status: AC

## 2021-10-09 NOTE — Assessment & Plan Note (Signed)
Also for ENT referral 

## 2021-10-09 NOTE — Progress Notes (Signed)
Patient ID: Mia [name], female   DOB: Aug 27, 1955, 66 y.o.   MRN: [id number]        Chief Complaint: follow up right ear pain and now muffled hearing after using cotton ball last night       HPI:  Mia [name] is a 66 y.o. female here with c/o 2 days worsening right ear pain, without fever or drainage, but slept with cotton ball last pm, today with worsening muffled hearing.  Pt denies chest pain, increased sob or doe, wheezing, orthopnea, PND, increased LE swelling, palpitations, dizziness or syncope.   Pt denies polydipsia, polyuria, or new focal neuro s/s.    Pt denies fever, wt loss, night sweats, loss of appetite, or other constitutional symptoms         Wt Readings from Last 3 Encounters:  10/09/21 (!) 343 lb (155.6 kg)  08/04/21 (!) 342 lb (155.1 kg)  02/23/21 (!) 332 lb (150.6 kg)   BP Readings from Last 3 Encounters:  10/09/21 136/74  08/04/21 (!) 158/90  02/23/21 (!) 146/82         Past Medical History:  Diagnosis Date   Allergic rhinitis, cause unspecified 04/29/2011   Anemia    Anemia, unspecified 04/23/2011   Back pain    Bilateral swelling of feet and ankles    Chronic anticoagulation    Chronic headaches    Colon polyps 05/2009   Constipation    Diverticulosis    DVT (deep venous thrombosis) (HCC)    right upper extremitiy   GERD (gastroesophageal reflux disease) 11/10/2011   Gout 04/22/2010   H/O blood clots    H/O: GI bleed    Hyperlipidemia    Hyperplastic colon polyp    Hypertension    IBS (irritable bowel syndrome)    Infected prosthetic knee joint (HCC)    Joint pain    Migraine 04/29/2011   Morbid obesity (HCC)    Nonsmoker    Other fatigue    Pulmonary embolism (HCC) 2010   following right knee replacement   Shortness of breath    SOBOE (shortness of breath on exertion)    Vitamin D deficiency    WPW (Wolff-Parkinson-White syndrome)    Past Surgical History:  Procedure Laterality Date   CHOLECYSTECTOMY     TOTAL KNEE  ARTHROPLASTY Right 2010   followed by I & D for infection   TUBAL LIGATION      reports that she has never smoked. She has never used smokeless tobacco. She reports that she does not drink alcohol and does not use drugs. family history includes Colon cancer in an other family member; Coronary artery disease in an other family member; Diabetes in her father; Heart Problems in her mother; Heart disease in her mother and another family member; Hypertension in her brother, sister, sister, and sister; Pancreatic cancer in her sister; Throat cancer in her father. Allergies  Allergen Reactions   Crestor [Rosuvastatin Calcium] Other (See Comments)    Chest pain   Amlodipine Other (See Comments)    Edema,ha   Piperacillin Sod-Tazobactam So Other (See Comments)    REACTION: "UNSURE"   Ultram [Tramadol] Nausea Only   Current Outpatient Medications on File Prior to Visit  Medication Sig Dispense Refill   atorvastatin (LIPITOR) 80 MG tablet Take 1 tablet (80 mg total) by mouth daily. 90 tablet 3   carvedilol (COREG) 6.25 MG tablet TAKE 1 TABLET(6.25 MG) BY MOUTH TWICE DAILY 180 tablet 3   Cholecalciferol (DIALYVITE VITAMIN D  5000) 125 MCG (5000 UT) capsule Take 1 capsule (5,000 Units total) by mouth daily. 30 capsule 0   cyclobenzaprine (FLEXERIL) 5 MG tablet 1 tab by mouth three times daily as needed 60 tablet 1   lactulose (CHRONULAC) 10 GM/15ML solution Take 45 mLs (30 g total) by mouth 2 (two) times daily as needed for mild constipation. 236 mL 2   losartan (COZAAR) 100 MG tablet TAKE 1 TABLET BY MOUTH DAILY 90 tablet 3   meclizine (ANTIVERT) 12.5 MG tablet Take 1 tablet (12.5 mg total) by mouth 3 (three) times daily as needed for dizziness. 40 tablet 1   polyethylene glycol powder (GLYCOLAX/MIRALAX) 17 GM/SCOOP powder Take twice daily ( 17gr) until stooling regular, then once daily 3350 g 1   spironolactone (ALDACTONE) 25 MG tablet TAKE 1 TABLET(25 MG) BY MOUTH DAILY 90 tablet 3   XARELTO 20 MG  TABS tablet TAKE 1 TABLET(20 MG) BY MOUTH DAILY WITH SUPPER 30 tablet 11   No current facility-administered medications on file prior to visit.        ROS:  All others reviewed and negative.  Objective        PE:  BP 136/74 (BP Location: Right Arm, Patient Position: Sitting, Cuff Size: Large)   Pulse 72   Temp 98.2 F (36.8 C) (Oral)   Ht 5\' 3"  (1.6 m)   Wt (!) 343 lb (155.6 kg)   SpO2 93%   BMI 60.76 kg/m                 Constitutional: Pt appears in NAD               HENT: Head: NCAT.                Right Ear: External ear normal.  Right canal with probable cotton ball material near TM, no overt drainage               Left Ear: External ear normal.                Eyes: . Pupils are equal, round, and reactive to light. Conjunctivae and EOM are normal               Nose: without d/c or deformity               Neck: Neck supple. Gross normal ROM               Cardiovascular: Normal rate and regular rhythm.                 Pulmonary/Chest: Effort normal and breath sounds without rales or wheezing.                              Neurological: Pt is alert. At baseline orientation, motor grossly intact               Skin: Skin is warm. No rashes, no other new lesions, LE edema - none               Psychiatric: Pt behavior is normal without agitation   Micro: none  Cardiac tracings I have personally interpreted today:  none  Pertinent Radiological findings (summarize): none   Lab Results  Component Value Date   WBC 9.6 08/04/2021   HGB 14.1 08/04/2021   HCT 43.3 08/04/2021   PLT 212.0 08/04/2021   GLUCOSE 90 08/04/2021   CHOL 151  08/04/2021   TRIG 88.0 08/04/2021   HDL 40.80 08/04/2021   LDLDIRECT 148.9 12/11/2012   LDLCALC 93 08/04/2021   ALT 10 08/04/2021   AST 16 08/04/2021   NA 142 08/04/2021   K 4.2 08/04/2021   CL 104 08/04/2021   CREATININE 1.77 (H) 08/04/2021   BUN 24 (H) 08/04/2021   CO2 30 08/04/2021   TSH 1.33 08/04/2021   INR 2.8 03/28/2018   HGBA1C 6.4  08/04/2021   MICROALBUR <0.7 08/11/2020   Assessment/Plan:  Mia [name] is a 66 y.o. Black or African American [2] female with  has a past medical history of Allergic rhinitis, cause unspecified (04/29/2011), Anemia, Anemia, unspecified (04/23/2011), Back pain, Bilateral swelling of feet and ankles, Chronic anticoagulation, Chronic headaches, Colon polyps (05/2009), Constipation, Diverticulosis, DVT (deep venous thrombosis) ([place]), GERD (gastroesophageal reflux disease) (11/10/2011), Gout (04/22/2010), H/O blood clots, H/O: GI bleed, Hyperlipidemia, Hyperplastic colon polyp, Hypertension, IBS (irritable bowel syndrome), Infected prosthetic knee joint ([place]), Joint pain, Migraine (04/29/2011), Morbid obesity ([place]), Nonsmoker, Other fatigue, Pulmonary embolism ([place]) (2010), Shortness of breath, SOBOE (shortness of breath on exertion), Vitamin D deficiency, and WPW (Wolff-Parkinson-White syndrome).  Right ear pain Etiology unclear, may have infectious otitis externa but difficult as it appears there may be cotton ball material near the TM at the end of the canal, for zpack and refer ENT  Ear foreign body Also for ENT referral  Acute hearing loss, right Also for ENT referral  HTN (hypertension) BP Readings from Last 3 Encounters:  10/09/21 136/74  08/04/21 (!) 158/90  02/23/21 (!) 146/82   improved, pt to continue medical treatment coreg 6.25 bid, losartan 100 mg qd, aldactone 25 mg qd   Followup: No follow-ups on file.  Cathlean Cower, MD 10/09/2021 4:51 PM [facility]

## 2021-10-09 NOTE — Assessment & Plan Note (Addendum)
Etiology unclear, may have infectious otitis externa but difficult as it appears there may be cotton ball material near the TM at the end of the canal, for zpack and refer ENT

## 2021-10-09 NOTE — Patient Instructions (Signed)
Please take all new medication as prescribed - the antibiotic  You will be contacted regarding the referral for: ENT (urgent) to help remove the material from your ear canal  Unfortunately I have no prescription pain medication to offer as you should not be taking the Advil kind of medicine, and you have not been able to take the tramadol in the past.   OK to try tylenol as needed for pain  Please continue all other medications as before, and refills have been done if requested.  Please have the pharmacy call with any other refills you may need.  Please keep your appointments with your specialists as you may have planned

## 2021-10-09 NOTE — Assessment & Plan Note (Signed)
BP Readings from Last 3 Encounters:  10/09/21 136/74  08/04/21 (!) 158/90  02/23/21 (!) 146/82   improved, pt to continue medical treatment coreg 6.25 bid, losartan 100 mg qd, aldactone 25 mg qd

## 2021-10-27 ENCOUNTER — Other Ambulatory Visit: Payer: Self-pay | Admitting: Cardiology

## 2021-10-30 ENCOUNTER — Ambulatory Visit
Admission: RE | Admit: 2021-10-30 | Discharge: 2021-10-30 | Disposition: A | Discharge status: Home / Self Care | Payer: BC Managed Care – PPO | Source: Ambulatory Visit | Attending: Internal Medicine | Admitting: Internal Medicine

## 2021-10-30 DIAGNOSIS — Z1231 Encounter for screening mammogram for malignant neoplasm of breast: Secondary | ICD-10-CM

## 2021-11-23 ENCOUNTER — Other Ambulatory Visit (HOSPITAL_COMMUNITY): Payer: Self-pay | Admitting: Urology

## 2021-11-23 DIAGNOSIS — N281 Cyst of kidney, acquired: Secondary | ICD-10-CM

## 2021-12-02 ENCOUNTER — Telehealth: Payer: Self-pay

## 2021-12-02 NOTE — Telephone Encounter (Signed)
**Note De-Identified Demarian Epps Obfuscation** I attempted a Xarelto PA through covermymeds and received this message: Shanon Seawright Key: [id number] - Rx #: [id number] Status: THIS MEMBER HAS WORKERS COMPENSATION COVERAGE, [employer] DOES NOT MANAGE PA FOR THESE CLIENTS. Drug Xarelto 20MG  tablets Form [employer] Electronic PA Form [phone] NCPDP)  I called [facility] and was advised that Xarelto is not covered under workers comp but that the pts ins. is covering Xarelto without a PA required for $47/30 day supply.  The pharmacist stated that she is currently filling the RX and will contact the pt when ready for pick up.

## 2021-12-16 ENCOUNTER — Ambulatory Visit (HOSPITAL_COMMUNITY): Payer: BC Managed Care – PPO | Admitting: Physician Assistant

## 2021-12-16 ENCOUNTER — Encounter (HOSPITAL_COMMUNITY): Payer: Self-pay | Admitting: *Deleted

## 2021-12-16 NOTE — Progress Notes (Signed)
Anesthesia Chart Review: Same day workup  Follows with cardiology for history of HTN, HLD, history of DVT/PE on Xarelto, 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  He was recently diagnosed by Dr. Patty Sermons.  She was seen by Herma Carson, PA-C 01/13/2021 and reported some palpitations and chest tightness with yard work.  Event monitoring stress test were ordered.  Stress test 01/28/2021 was low risk.  Event monitor showed underlying rhythm to be sinus, rare PAC/PVC, no A-fib or pauses.  Non-insulin-dependent DM2, A1c 6.4 on 08/04/2021.  Patient will need day of surgery labs and evaluation.  Event monitor 02/05/2021: Sinus rhythm avg 76 bpm Rare PAC, PVC No atrial fibrillation, no pauses Reassuring monitor  Nuclear stress 01/28/2021:   Findings are consistent with no prior ischemia. The study is low risk.   No ST deviation was noted.   LV perfusion is abnormal. There is a medium defect with moderate reduction in uptake present in the apical to mid inferolateral, lateral and mid inferior LV segments that are fixed. There is normal wall motion in the defect area. May be related to artifact due to extracardiac tracer uptake and diaphragmatic attentuation.   Left ventricular function is normal. Nuclear stress EF: 63 %. The left ventricular ejection fraction is normal (55-65%). End diastolic cavity size is normal.   Prior study not available for comparison.    Zannie Cove [facility] Short Stay Center/Anesthesiology Phone [phone] 12/16/2021 10:38 AM

## 2021-12-16 NOTE — Progress Notes (Signed)
PCP - Dr Oliver Barre Cardiologist - Dr Donato Schultz  CT Chest x-ray - 10/09/98 EKG - 10/27/20 Stress Test - 01/28/21 ECHO - 01/10/10 Cardiac Cath - n/a  ICD Pacemaker/Loop - n/a  Sleep Study -  n/a CPAP - none  Diabetes - none  Blood Thinner Instructions:  Follow your surgeon's instructions on when to stop Xarelto prior to surgery.  Last dose was on 12/16/21  Anesthesia review: Yes  STOP now taking any Aspirin (unless otherwise instructed by your surgeon), Aleve, Naproxen, Ibuprofen, Motrin, Advil, Goody's, BC's, all herbal medications, fish oil, and all vitamins.   Coronavirus Screening Do you have any of the following symptoms:  Cough yes/no: No Fever (>100.37F)  yes/no: No Runny nose yes/no: No Sore throat yes/no: No Difficulty breathing/shortness of breath  yes/no: No  Have you traveled in the last 14 days and where? yes/no: No  Patient verbalized understanding of instructions that were given via phone.

## 2021-12-16 NOTE — Anesthesia Preprocedure Evaluation (Signed)
Anesthesia Evaluation  Patient identified by MRN, date of birth, ID band Patient awake    Reviewed: Allergy & Precautions, NPO status , Patient's Chart, lab work & pertinent test results  Airway Mallampati: II       Dental   Pulmonary shortness of breath   breath sounds clear to auscultation       Cardiovascular hypertension,  Rhythm:Regular Rate:Normal     Neuro/Psych  Headaches  Neuromuscular disease    GI/Hepatic Neg liver ROS,GERD  ,,  Endo/Other    Morbid obesity  Renal/GU negative Renal ROS     Musculoskeletal  (+) Arthritis ,    Abdominal   Peds  Hematology   Anesthesia Other Findings   Reproductive/Obstetrics                             Anesthesia Physical Anesthesia Plan  ASA: 3  Anesthesia Plan: General   Post-op Pain Management:    Induction:   PONV Risk Score and Plan: Ondansetron  Airway Management Planned: Oral ETT  Additional Equipment:   Intra-op Plan:   Post-operative Plan: Extubation in OR  Informed Consent:      Dental advisory given  Plan Discussed with: CRNA and Anesthesiologist  Anesthesia Plan Comments: (PAT note by Antionette Poles, PA-C: Follows with cardiology for history of HTN, HLD, history of DVT/PE on Xarelto, 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  He was recently diagnosed by Dr. Patty Sermons.  She was seen by Herma Carson, PA-C 01/13/2021 and reported some palpitations and chest tightness with yard work.  Event monitoring stress test were ordered.  Stress test 01/28/2021 was low risk.  Event monitor showed underlying rhythm to be sinus, rare PAC/PVC, no A-fib or pauses.  Non-insulin-dependent DM2, A1c 6.4 on 08/04/2021.  Patient will need day of surgery labs and evaluation.  Event monitor 02/05/2021: ? Sinus rhythm avg 76 bpm ? Rare PAC, PVC ? No atrial fibrillation, no pauses ? Reassuring monitor  Nuclear stress  01/28/2021:  Findings are consistent with no prior ischemia. The study is low risk.  No ST deviation was noted.  LV perfusion is abnormal. There is a medium defect with moderate reduction in uptake present in the apical to mid inferolateral, lateral and mid inferior LV segments that are fixed. There is normal wall motion in the defect area. May be related to artifact due to extracardiac tracer uptake and diaphragmatic attentuation.  Left ventricular function is normal. Nuclear stress EF: 63 %. The left ventricular ejection fraction is normal (55-65%). End diastolic cavity size is normal.  Prior study not available for comparison.   )        Anesthesia Quick Evaluation

## 2021-12-16 NOTE — Progress Notes (Signed)
Called MD's office, spoke with nurse Melanie at [phone].  Informed that we need H & P to be placed in EPIC for surgery on 12/17/21.  Nurse Shawna Orleans was informed that the H & P in EPIC (and on chart is dated 11/12/21).  We need a new H & P dated within 30 days of surgery.  Melanie verbalized understanding of the above information.  Marland Kitchen

## 2021-12-17 ENCOUNTER — Ambulatory Visit (HOSPITAL_COMMUNITY): Admission: RE | Admit: 2021-12-17 | Payer: BC Managed Care – PPO | Source: Ambulatory Visit | Admitting: Urology

## 2021-12-17 ENCOUNTER — Encounter (HOSPITAL_COMMUNITY)
Admission: RE | Disposition: A | Discharge status: Home / Self Care | Payer: Self-pay | Source: Ambulatory Visit | Attending: Urology

## 2021-12-17 ENCOUNTER — Encounter (HOSPITAL_COMMUNITY): Payer: Self-pay

## 2021-12-17 ENCOUNTER — Encounter (HOSPITAL_COMMUNITY): Payer: Self-pay | Admitting: Urology

## 2021-12-17 ENCOUNTER — Ambulatory Visit (HOSPITAL_COMMUNITY)
Admission: RE | Admit: 2021-12-17 | Discharge: 2021-12-17 | Disposition: A | Discharge status: Home / Self Care | Payer: BC Managed Care – PPO | Source: Ambulatory Visit | Attending: Urology | Admitting: Urology

## 2021-12-17 ENCOUNTER — Other Ambulatory Visit: Payer: Self-pay

## 2021-12-17 DIAGNOSIS — Z538 Procedure and treatment not carried out for other reasons: Secondary | ICD-10-CM | POA: Diagnosis not present

## 2021-12-17 DIAGNOSIS — Z01818 Encounter for other preprocedural examination: Secondary | ICD-10-CM | POA: Diagnosis not present

## 2021-12-17 HISTORY — DX: Prediabetes: R73.03

## 2021-12-17 SURGERY — MRI WITH ANESTHESIA
Anesthesia: General

## 2021-12-17 MED ORDER — ORAL CARE MOUTH RINSE: 15.0000 mL | Freq: Once | OROMUCOSAL | Status: DC

## 2021-12-17 MED ORDER — LACTATED RINGERS IV SOLN: INTRAVENOUS | Status: DC

## 2021-12-17 MED ORDER — CHLORHEXIDINE GLUCONATE 0.12 % MT SOLN
OROMUCOSAL | Status: AC
  Administered 2021-12-17: 15 mL via OROMUCOSAL
  Filled 2021-12-17: qty 15

## 2021-12-17 MED ORDER — CHLORHEXIDINE GLUCONATE 0.12 % MT SOLN: 15.0000 mL | Freq: Once | OROMUCOSAL | Status: AC

## 2021-12-17 MED ORDER — CHLORHEXIDINE GLUCONATE 0.12 % MT SOLN: 15.0000 mL | Freq: Once | OROMUCOSAL | Status: DC

## 2021-12-17 MED ORDER — ORAL CARE MOUTH RINSE: 15.0000 mL | Freq: Once | OROMUCOSAL | Status: AC

## 2021-12-17 NOTE — Progress Notes (Addendum)
Patient arrived this morning in short stay for CT abdomen but a recent  H&P (dated within 30 days of procedure) is not placed in EPIC. Per anesthesia, an updated H&P must be in EPIC. The procedure was canceled and the patient was discharged home. Patient was instructed to call MD office and scheduled an appointment with MD. Patient verbalized understanding. Patient was accompanied bu staff to the main entrance.

## 2021-12-18 NOTE — H&P (Signed)
I have a complex kidney cyst.  HPI: Mia [name] is a 66 year-old female patient who was referred by Dr. Oliver Barre, MD who is here for a complex renal cyst.    Mia [name] is a 66 yo female who is sent for a 1.9cm complex right renal cyst seen on an [place] in 8/23 for progressive renal insufficiency. Her Cr at that time was 1.77 with a GFR of 30. She has some low back pain. 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 She has minimal LUTS with nocturia x 1 with high fluid intake.      ALLERGIES: Amlodipine - headache; edema Crestor TABS - chest pain Piperacillin Sodium Ultram - nausea    MEDICATIONS: Atorvastatin Calcium 80 mg tablet  Carvedilol 6.25 mg tablet  Losartan Potassium 100 mg tablet  Spironolactone 25 mg tablet  Vitamin D3  Xarelto 20 mg tablet     GU PSH: None     PSH Notes: RT knee replacement with total of 5 surgeries, rt wrist surgery , Bakers cyst removed left knee   NON-GU PSH: Cholecystectomy (open) Knee Arthroscopy, Left Knee replacement, Right     GU PMH: None     PMH Notes: clots both arms legs,lungs,  wpw syndrome,  diverticulosis,  colon polyps   NON-GU PMH: Arthritis DVT, History GERD Gout Hypercholesterolemia Hyperglycemia, unspecified Hypertension Other specified cardiac arrhythmias Pulmonary Embolism, History Vitamin D deficiency, unspecified    FAMILY HISTORY: Prostate Cancer - Brother    Notes: 2 sons   SOCIAL HISTORY: Marital Status: Single Preferred Language: English; Race: Black or African American Current Smoking Status: Patient has never smoked.   Tobacco Use Assessment Completed: Used Tobacco in last 30 days? Has never drank.  Drinks 1 caffeinated drink per day. Patient's occupation is/was retired.    REVIEW OF SYSTEMS:    GU Review Female:   Patient denies frequent urination, hard to postpone urination, burning /pain with urination, get up at night to urinate, leakage of urine, stream  starts and stops, trouble starting your stream, have to strain to urinate, and being pregnant.  Gastrointestinal (Upper):   Patient reports indigestion/ heartburn. Patient denies nausea and vomiting.  Gastrointestinal (Lower):   Patient reports constipation. Patient denies diarrhea.  Constitutional:   Patient reports fatigue. Patient denies fever, night sweats, and weight loss.  Skin:   Patient reports itching. Patient denies skin rash/ lesion.  Eyes:   Patient denies blurred vision and double vision.  Ears/ Nose/ Throat:   Patient denies sore throat and sinus problems.  Hematologic/Lymphatic:   Patient reports easy bruising. Patient denies swollen glands.  Cardiovascular:   Patient denies leg swelling and chest pains.  Respiratory:   Patient denies cough and shortness of breath.  Endocrine:   Patient denies excessive thirst.  Musculoskeletal:   Patient reports back pain. Patient denies joint pain.  Neurological:   Patient reports headaches. Patient denies dizziness.  Psychologic:   Patient denies depression and anxiety.   VITAL SIGNS:      11/11/2021 09:36 AM  Weight 343 lb / 155.58 kg  Height 63 in / 160.02 cm  BP 138/89 mmHg  Pulse 81 /min  Temperature 97.8 F / 36.5 C  BMI 60.8 kg/m   MULTI-SYSTEM PHYSICAL EXAMINATION:    Constitutional: Obese. No physical deformities. Normally developed. Good grooming.   Respiratory: Normal breath sounds. No labored breathing, no use of accessory muscles.   Cardiovascular: Regular rate and rhythm. No murmur, no gallop.  Skin: No paleness, no jaundice, no cyanosis. No lesion, no ulcer, no rash.  Neurologic / Psychiatric: Oriented to time, oriented to place, oriented to person. No depression, no anxiety, no agitation.  Gastrointestinal: Obese abdomen. No mass, no tenderness, no rigidity.   Musculoskeletal: Normal gait and station of head and neck.     Complexity of Data:  Lab Test Review:   BMP, CBC with Diff, Hgb-A1c  Records Review:    Previous Doctor Records  Urine Test Review:   Urinalysis  X-Ray Review: Renal Ultrasound: Reviewed Report. Discussed With Patient. 1.9cm complex right renal cyst noted. CT w/wo recommended for further evaluation.  C.T. Abdomen/Pelvis: Reviewed Report. Discussed With Patient. 2012. Left hypodensity was noted.     PROCEDURES:          Urinalysis - 81003 Dipstick Dipstick Cont'd  Color: Yellow Bilirubin: Neg mg/dL  Appearance: Clear Ketones: Neg mg/dL  Specific Gravity: 1.884 Blood: Neg ery/uL  pH: 6.0 Protein: Neg mg/dL  Glucose: Neg mg/dL Urobilinogen: 0.2 mg/dL    Nitrites: Neg    Leukocyte Esterase: Neg leu/uL    ASSESSMENT:      ICD-10 Details  1 GU:   Renal cyst - N28.1 Undiagnosed New Problem - She has a complex right renal cyst that needs further evaluation. I will repeat a BUN/Cr and if that is acceptable, I will get her set up for renal protocol CT. She is too claustraphobic for an MRI per her report.   2   Chronic kidney disease stage 3 (GFR 30-60) - N18.3 Chronic, Worsening - BUN/cr.    PLAN:           Orders Labs BUN/Creatinine  X-Rays: C.T. Abdomen With and Without I.V. Contrast - She needs a renal protocol CT for a complex right renal cyst. Her weight is 340lb. Her last Cr was 1.77 but that was up for her. I have ordered that for today.           Schedule Return Visit/Planned Activity: Next Available Appointment - Office Visit, Extender, F/U Pending Results             Note: with CT results          Document Letter(s):  Created for Patient: Clinical Summary         Notes:   CC: Dr. Oliver Barre.

## 2022-01-01 NOTE — Progress Notes (Addendum)
Spoke with pt for pre-op call. She states she has talked with Dr. Belva Crome office and has cancelled the CT scan with anesthesia due to family illness. I called Dr. Belva Crome office and spoke with Lindsi, Development worker, international aid. She states she has called central scheduling to cancel the CT of abdomen with anesthesia. They will reschedule at a later date.  I called the OR desk and notified them that procedure has been cancelled.

## 2022-01-05 ENCOUNTER — Ambulatory Visit (HOSPITAL_COMMUNITY): Payer: BC Managed Care – PPO

## 2022-01-05 SURGERY — Surgical Case
Anesthesia: *Unknown

## 2022-02-04 ENCOUNTER — Ambulatory Visit: Payer: BC Managed Care – PPO | Admitting: Internal Medicine

## 2022-02-04 ENCOUNTER — Encounter: Payer: Self-pay | Admitting: Internal Medicine

## 2022-02-04 VITALS — BP 138/82 | HR 75 | Temp 97.8°F | Ht 63.0 in | Wt 347.0 lb

## 2022-02-04 DIAGNOSIS — R739 Hyperglycemia, unspecified: Secondary | ICD-10-CM | POA: Diagnosis not present

## 2022-02-04 DIAGNOSIS — N289 Disorder of kidney and ureter, unspecified: Secondary | ICD-10-CM

## 2022-02-04 DIAGNOSIS — I10 Essential (primary) hypertension: Secondary | ICD-10-CM | POA: Diagnosis not present

## 2022-02-04 DIAGNOSIS — E559 Vitamin D deficiency, unspecified: Secondary | ICD-10-CM | POA: Diagnosis not present

## 2022-02-04 LAB — LIPID PANEL
Cholesterol: 170 mg/dL (ref 0–200)
HDL: 38.9 mg/dL — ABNORMAL LOW (ref >39.00)
LDL Cholesterol: 105 mg/dL — ABNORMAL HIGH (ref 0–99)
NonHDL: 131.26
Total CHOL/HDL Ratio: 4
Triglycerides: 129 mg/dL (ref 0.0–149.0)
VLDL: 25.8 mg/dL (ref 0.0–40.0)

## 2022-02-04 LAB — HEPATIC FUNCTION PANEL
ALT: 10 U/L (ref 0–35)
AST: 12 U/L (ref 0–37)
Albumin: 3.7 g/dL (ref 3.5–5.2)
Alkaline Phosphatase: 64 U/L (ref 39–117)
Bilirubin, Direct: 0.1 mg/dL (ref 0.0–0.3)
Total Bilirubin: 0.5 mg/dL (ref 0.2–1.2)
Total Protein: 6.8 g/dL (ref 6.0–8.3)

## 2022-02-04 LAB — BASIC METABOLIC PANEL
BUN: 15 mg/dL (ref 6–23)
CO2: 30 mEq/L (ref 19–32)
Calcium: 10.2 mg/dL (ref 8.4–10.5)
Chloride: 107 mEq/L (ref 96–112)
Creatinine, Ser: 0.72 mg/dL (ref 0.40–1.20)
GFR: 88.39 mL/min (ref >60.00)
Glucose, Bld: 82 mg/dL (ref 70–99)
Potassium: 4 mEq/L (ref 3.5–5.1)
Sodium: 142 mEq/L (ref 135–145)

## 2022-02-04 LAB — HEMOGLOBIN A1C: Hgb A1c MFr Bld: 6.3 % (ref 4.6–6.5)

## 2022-02-04 NOTE — Patient Instructions (Addendum)
Please put the MyChart app on your phone; Your Username is [place], and the Password is [id number]   Please continue all other medications as before, and refills have been done if requested.  Please have the pharmacy call with any other refills you may need.  Please keep your appointments with your specialists as you may have planned - ENT, urology, and orthopedic  Please go to the LAB at the blood drawing area for the tests to be done  You will be contacted by phone if any changes need to be made immediately.  Otherwise, you will receive a letter about your results with an explanation, but please check with MyChart first.  Please remember to sign up for MyChart if you have not done so, as this will be important to you in the future with finding out test results, communicating by private email, and scheduling acute appointments online when needed.  Please make an Appointment to return in 6 months, or sooner if needed

## 2022-02-04 NOTE — Progress Notes (Signed)
Patient ID: Mia [name], female   DOB: 1955/09/16, 67 y.o.   MRN: [id number]        Chief Complaint: follow up renal insufficiency,m hyperglycemia, left kidney lesion, right knee pain, low vit d       HPI:  Mia [name] is a 67 y.o. female here overall doing ok, Has appt with ENT tomorrow for ear pain, first she could get.  Has not yet gotten the CT yet per urology for the left kidney complex lesion, but has f/u urology appt on feb 5 - 3rd visit per pt.,  Right knee with some locking and gait difficulty but wants to look kidneys fist.  Pt denies chest pain, increased sob or doe, wheezing, orthopnea, PND, increased LE swelling, palpitations, dizziness or syncope.   Pt denies polydipsia, polyuria, or new focal neuro s/s.    Pt denies fever, wt loss, night sweats, loss of appetite, or other constitutional symptoms   Wt Readings from Last 3 Encounters:  02/04/22 (!) 347 lb (157.4 kg)  12/17/21 (!) 340 lb (154.2 kg)  10/09/21 (!) 343 lb (155.6 kg)   BP Readings from Last 3 Encounters:  02/04/22 138/82  12/17/21 (!) 144/71  10/09/21 136/74         Past Medical History:  Diagnosis Date   Allergic rhinitis, cause unspecified 04/29/2011   Anemia    Anemia, unspecified 04/23/2011   Back pain    Bilateral swelling of feet and ankles    Chronic anticoagulation    Chronic headaches    Colon polyps 05/2009   Constipation    Diverticulosis    DVT (deep venous thrombosis) (HCC)    right upper extremitiy   GERD (gastroesophageal reflux disease) 11/10/2011   Gout 04/22/2010   no current problems per patient on 12/16/21   H/O blood clots    H/O: GI bleed    Hyperlipidemia    Hyperplastic colon polyp    Hypertension    IBS (irritable bowel syndrome)    Infected prosthetic knee joint (HCC)    Joint pain    Migraine 04/29/2011   otc meds prn   Morbid obesity (HCC)    Nonsmoker    Other fatigue    Pre-diabetes    no meds   Pulmonary embolism ([place]) 2010   following right knee  replacement   Shortness of breath    with exertion   SOBOE (shortness of breath on exertion)    Vitamin D deficiency    WPW (Wolff-Parkinson-White syndrome)    Past Surgical History:  Procedure Laterality Date   CHOLECYSTECTOMY     open surgery with abd incision   COLONOSCOPY     x 7   DIAGNOSTIC LAPAROSCOPY Left    knee   DIAGNOSTIC LAPAROSCOPY Right    right wrist surgery for cyst   KNEE ARTHROSCOPY Left    MULTIPLE TOOTH EXTRACTIONS     all upper teeth extracted - upper partial   TOTAL KNEE ARTHROPLASTY Right 01/05/2008   followed by I & D for infection  x 2   TUBAL LIGATION     WISDOM TOOTH EXTRACTION      reports that she has never smoked. She has never used smokeless tobacco. She reports that she does not drink alcohol and does not use drugs. family history includes Colon cancer in an other family member; Coronary artery disease in an other family member; Diabetes in her father; Heart Problems in her mother; Heart disease in her mother and another family  member; Hypertension in her brother, sister, sister, and sister; Pancreatic cancer in her sister; Throat cancer in her father. Allergies  Allergen Reactions   Crestor [Rosuvastatin Calcium] Other (See Comments)    Chest pain   Amlodipine Other (See Comments)    Edema, headache    Ultram [Tramadol] Nausea Only   Zosyn [Piperacillin Sod-Tazobactam So] Other (See Comments)    REACTION: "UNSURE"   Current Outpatient Medications on File Prior to Visit  Medication Sig Dispense Refill   acetaminophen (TYLENOL) 650 MG CR tablet Take 1,300 mg by mouth at bedtime.     atorvastatin (LIPITOR) 80 MG tablet Take 1 tablet (80 mg total) by mouth daily. 90 tablet 3   carvedilol (COREG) 6.25 MG tablet TAKE 1 TABLET(6.25 MG) BY MOUTH TWICE DAILY 180 tablet 3   Cholecalciferol (VITAMIN D3) 50 MCG (2000 UT) CAPS Take 4,000 Units by mouth daily.     diclofenac Sodium (VOLTAREN) 1 % GEL Apply 1 Application topically 2 (two) times daily as  needed (back/knee pain).     lactulose (CHRONULAC) 10 GM/15ML solution Take 45 mLs (30 g total) by mouth 2 (two) times daily as needed for mild constipation. 236 mL 2   losartan (COZAAR) 100 MG tablet TAKE 1 TABLET BY MOUTH DAILY 90 tablet 0   polyethylene glycol powder (GLYCOLAX/MIRALAX) 17 GM/SCOOP powder Take twice daily ( 17gr) until stooling regular, then once daily (Patient taking differently: Take 17 g by mouth daily as needed for moderate constipation.) 3350 g 1   spironolactone (ALDACTONE) 25 MG tablet TAKE 1 TABLET(25 MG) BY MOUTH DAILY 90 tablet 3   XARELTO 20 MG TABS tablet TAKE 1 TABLET(20 MG) BY MOUTH DAILY WITH SUPPER 30 tablet 11   meclizine (ANTIVERT) 12.5 MG tablet Take 1 tablet (12.5 mg total) by mouth 3 (three) times daily as needed for dizziness. (Patient not taking: Reported on 12/30/2021) 40 tablet 1   No current facility-administered medications on file prior to visit.        ROS:  All others reviewed and negative.  Objective        PE:  BP 138/82 (BP Location: Right Arm, Patient Position: Sitting, Cuff Size: Large)   Pulse 75   Temp 97.8 F (36.6 C) (Oral)   Ht 5\' 3"  (1.6 m)   Wt (!) 347 lb (157.4 kg)   LMP 04/10/2010   SpO2 93%   BMI 61.47 kg/m                 Constitutional: Pt appears in NAD               HENT: Head: NCAT.                Right Ear: External ear normal.                 Left Ear: External ear normal.                Eyes: . Pupils are equal, round, and reactive to light. Conjunctivae and EOM are normal               Nose: without d/c or deformity               Neck: Neck supple. Gross normal ROM               Cardiovascular: Normal rate and regular rhythm.                 Pulmonary/Chest: Effort  normal and breath sounds without rales or wheezing.                Abd:  Soft, NT, ND, + BS, no organomegaly               Neurological: Pt is alert. At baseline orientation, motor grossly intact               Skin: Skin is warm. No rashes, no  other new lesions, LE edema - none               Psychiatric: Pt behavior is normal without agitation   Micro: none  Cardiac tracings I have personally interpreted today:  none  Pertinent Radiological findings (summarize): none   Lab Results  Component Value Date   WBC 9.6 08/04/2021   HGB 14.1 08/04/2021   HCT 43.3 08/04/2021   PLT 212.0 08/04/2021   GLUCOSE 90 08/04/2021   CHOL 151 08/04/2021   TRIG 88.0 08/04/2021   HDL 40.80 08/04/2021   LDLDIRECT 148.9 12/11/2012   LDLCALC 93 08/04/2021   ALT 10 08/04/2021   AST 16 08/04/2021   NA 142 08/04/2021   K 4.2 08/04/2021   CL 104 08/04/2021   CREATININE 1.77 (H) 08/04/2021   BUN 24 (H) 08/04/2021   CO2 30 08/04/2021   TSH 1.33 08/04/2021   INR 2.8 03/28/2018   HGBA1C 6.4 08/04/2021   MICROALBUR <0.7 08/11/2020   Assessment/Plan:  MELISSE CAETANO is a 67 y.o. Black or African American [2] female with  has a past medical history of Allergic rhinitis, cause unspecified (04/29/2011), Anemia, Anemia, unspecified (04/23/2011), Back pain, Bilateral swelling of feet and ankles, Chronic anticoagulation, Chronic headaches, Colon polyps (05/2009), Constipation, Diverticulosis, DVT (deep venous thrombosis) ([place]), GERD (gastroesophageal reflux disease) (11/10/2011), Gout (04/22/2010), H/O blood clots, H/O: GI bleed, Hyperlipidemia, Hyperplastic colon polyp, Hypertension, IBS (irritable bowel syndrome), Infected prosthetic knee joint ([place]), Joint pain, Migraine (04/29/2011), Morbid obesity ([place]), Nonsmoker, Other fatigue, Pre-diabetes, Pulmonary embolism ([place]) (2010), Shortness of breath, SOBOE (shortness of breath on exertion), Vitamin D deficiency, and WPW (Wolff-Parkinson-White syndrome).  Renal insufficiency / Lab Results  Component Value Date   CREATININE 1.77 (H) 08/04/2021   ? CKD - for f/u lab today, consider renal referral  HTN (hypertension) BP Readings from Last 3 Encounters:  02/04/22 138/82  12/17/21 (!) 144/71   10/09/21 136/74   Stable, pt to continue medical treatment coreg 6.25 bid, losartan 100 qd   Hyperglycemia Lab Results  Component Value Date   HGBA1C 6.4 08/04/2021   Stable, pt to continue current medical treatment  - diet, wt control, for f/u A1c today   Vitamin D deficiency Last vitamin D Lab Results  Component Value Date   VD25OH 18.56 (L) 08/04/2021   Low, to start oral replacement   Kidney lesion, native, left For urology f/u feb 5, hopefully to complete the CT soon  Followup: Return in about 6 months (around 08/05/2022).  Cathlean Cower, MD 02/04/2022 1:53 PM [facility]

## 2022-02-04 NOTE — Assessment & Plan Note (Signed)
BP Readings from Last 3 Encounters:  02/04/22 138/82  12/17/21 (!) 144/71  10/09/21 136/74   Stable, pt to continue medical treatment coreg 6.25 bid, losartan 100 qd

## 2022-02-04 NOTE — Assessment & Plan Note (Signed)
For urology f/u feb 5, hopefully to complete the CT soon

## 2022-02-04 NOTE — Assessment & Plan Note (Signed)
.   Lab Results  Component Value Date   CREATININE 1.77 (H) 08/04/2021   ? CKD - for f/u lab today, consider renal referral

## 2022-02-04 NOTE — Assessment & Plan Note (Signed)
Last vitamin D Lab Results  Component Value Date   VD25OH 18.56 (L) 08/04/2021   Low, to start oral replacement

## 2022-02-04 NOTE — Assessment & Plan Note (Signed)
Lab Results  Component Value Date   HGBA1C 6.4 08/04/2021   Stable, pt to continue current medical treatment  - diet, wt control, for f/u A1c today

## 2022-02-05 DIAGNOSIS — L299 Pruritus, unspecified: Secondary | ICD-10-CM | POA: Insufficient documentation

## 2022-02-09 ENCOUNTER — Encounter: Payer: Self-pay | Admitting: Urology

## 2022-02-09 NOTE — Progress Notes (Unsigned)
I have a complex kidney cyst.  HPI: Mia [name] is a 67 year-old female patient who was referred by Dr. Cathlean Cower, MD who is here for a complex renal cyst.       Mia [name] is a 67 yo female who is sent for a 1.9cm complex right renal cyst seen on an [place] in 8/23 for progressive renal insufficiency. Her Cr at that time was 1.77 with a GFR of 30. She has some low back pain. 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 She has minimal LUTS with nocturia x 1 with high fluid intake.   Her Cr was 0.7 on 02/08/22.      ALLERGIES: Amlodipine - headache; edema Crestor TABS - chest pain Piperacillin Sodium Ultram - nausea     MEDICATIONS: Atorvastatin Calcium 80 mg tablet  Carvedilol 6.25 mg tablet  Losartan Potassium 100 mg tablet  Spironolactone 25 mg tablet  Vitamin D3  Xarelto 20 mg tablet      GU PSH: None     PSH Notes: RT knee replacement with total of 5 surgeries, rt wrist surgery , Bakers cyst removed left knee    NON-GU PSH: Cholecystectomy (open) Knee Arthroscopy, Left Knee replacement, Right       GU PMH: None     PMH Notes: clots both arms legs,lungs,  wpw syndrome,  diverticulosis,  colon polyps    NON-GU PMH: Arthritis DVT, History GERD Gout Hypercholesterolemia Hyperglycemia, unspecified Hypertension Other specified cardiac arrhythmias Pulmonary Embolism, History Vitamin D deficiency, unspecified     FAMILY HISTORY: Prostate Cancer - Brother    Notes: 2 sons    SOCIAL HISTORY: Marital Status: Single Preferred Language: English; Race: Black or African American Current Smoking Status: Patient has never smoked.    Tobacco Use Assessment Completed: Used Tobacco in last 30 days? Has never drank.  Drinks 1 caffeinated drink per day. Patient's occupation is/was retired.     REVIEW OF SYSTEMS:    GU Review Female:   Patient denies frequent urination, hard to postpone urination, burning /pain with urination, get up at night  to urinate, leakage of urine, stream starts and stops, trouble starting your stream, have to strain to urinate, and being pregnant.  Gastrointestinal (Upper):   Patient reports indigestion/ heartburn. Patient denies nausea and vomiting.  Gastrointestinal (Lower):   Patient reports constipation. Patient denies diarrhea.  Constitutional:   Patient reports fatigue. Patient denies fever, night sweats, and weight loss.  Skin:   Patient reports itching. Patient denies skin rash/ lesion.  Eyes:   Patient denies blurred vision and double vision.  Ears/ Nose/ Throat:   Patient denies sore throat and sinus problems.  Hematologic/Lymphatic:   Patient reports easy bruising. Patient denies swollen glands.  Cardiovascular:   Patient denies leg swelling and chest pains.  Respiratory:   Patient denies cough and shortness of breath.  Endocrine:   Patient denies excessive thirst.  Musculoskeletal:   Patient reports back pain. Patient denies joint pain.  Neurological:   Patient reports headaches. Patient denies dizziness.  Psychologic:   Patient denies depression and anxiety.    VITAL SIGNS:      11/11/2021 09:36 AM  Weight 343 lb / 155.58 kg  Height 63 in / 160.02 cm  BP 138/89 mmHg  Pulse 81 /min  Temperature 97.8 F / 36.5 C  BMI 60.8 kg/m    MULTI-SYSTEM PHYSICAL EXAMINATION:    Constitutional: Obese. No physical deformities. Normally developed. Good grooming.  Respiratory: Normal breath sounds. No labored breathing, no use of accessory muscles.   Cardiovascular: Regular rate and rhythm. No murmur, no gallop.  Skin: No paleness, no jaundice, no cyanosis. No lesion, no ulcer, no rash.  Neurologic / Psychiatric: Oriented to time, oriented to place, oriented to person. No depression, no anxiety, no agitation.  Gastrointestinal: Obese abdomen. No mass, no tenderness, no rigidity.   Musculoskeletal: Normal gait and station of head and neck.      Complexity of Data:  Lab Test Review:   BMP, CBC with  Diff, Hgb-A1c  Records Review:   Previous Doctor Records  Urine Test Review:   Urinalysis  X-Ray Review: Renal Ultrasound: Reviewed Report. Discussed With Patient. 1.9cm complex right renal cyst noted. CT w/wo recommended for further evaluation.  C.T. Abdomen/Pelvis: Reviewed Report. Discussed With Patient. 2012. Left hypodensity was noted.      PROCEDURES:           Urinalysis - 81003 Dipstick Dipstick Cont'd  Color: Yellow Bilirubin: Neg mg/dL  Appearance: Clear Ketones: Neg mg/dL  Specific Gravity: 1.015 Blood: Neg ery/uL  pH: 6.0 Protein: Neg mg/dL  Glucose: Neg mg/dL Urobilinogen: 0.2 mg/dL    Nitrites: Neg    Leukocyte Esterase: Neg leu/uL     BUN 19, Cr 0.7 on 02/08/22   ASSESSMENT:      ICD-10 Details  1 GU:   Renal cyst - N28.1 Undiagnosed New Problem - She has a complex right renal cyst that needs further evaluation. I will repeat a BUN/Cr and if that is acceptable, I will get her set up for renal protocol CT. She is too claustraphobic for an MRI per her report.   2   Chronic kidney disease stage 3 (GFR 30-60) - N18.3 Chronic, Worsening - BUN/cr.   Repeat renal function normal.    PLAN:            Orders Labs BUN/Creatinine  X-Rays: C.T. Abdomen With and Without I.V. Contrast - She needs a renal protocol CT for a complex right renal cyst. Her weight is 340lb. Her last Cr was 1.77 but that was up for her. I have ordered that for today.             Schedule Return Visit/Planned Activity: Next Available Appointment - Office Visit, Extender, F/U Pending Results             Note: with CT results            Document Letter(s):  Created for Patient: Clinical Summary

## 2022-02-24 ENCOUNTER — Encounter (HOSPITAL_COMMUNITY): Payer: Self-pay | Admitting: Vascular Surgery

## 2022-02-24 ENCOUNTER — Other Ambulatory Visit: Payer: Self-pay

## 2022-02-24 ENCOUNTER — Encounter (HOSPITAL_COMMUNITY): Payer: Self-pay | Admitting: *Deleted

## 2022-02-24 NOTE — Progress Notes (Signed)
Called patient. Patient states she received a phone call today from her insurance telling her the CT was not approved and that the CT for tomorrow 02/25/22 has been cancelled. Notified OR desk.

## 2022-02-24 NOTE — Progress Notes (Signed)
PCP - Dr. Cathlean Cower Cardiologist - Dr. Elta Guadeloupe, [name]  PPM/ICD - denies  EKG - 02/25/22 Stress Test - 01/28/21 ECHO - 01/10/10 Cardiac Cath - denies  CPAP - n/a  Fasting Blood Sugar - n/a  Blood Thinner Instructions: Xarelto - last dose - 02/24/22 per patient Patient was instructed: As of today, STOP taking any Aspirin (unless otherwise instructed by your surgeon) Aleve, Naproxen, Ibuprofen, Motrin, Advil, Goody's, BC's, all herbal medications, fish oil, and all vitamins.   ERAS Protcol - n/a  COVID TEST- n/a  Anesthesia review: yes  Patient verbally denies any shortness of breath, fever, cough and chest pain during phone call   -------------  SDW INSTRUCTIONS given:  Your procedure is scheduled on Thursday, February 22nd, 2024.  Report to [facility] Main Entrance "A" at 07:30 A.M., and check in at the Admitting office.  Call this number if you have problems the morning of surgery:  [phone]   Remember:  Do not eat or drink after midnight the night before your surgery    Take these medicines the morning of surgery with A SIP OF WATER: Lipitor, Coreg  The day of the procedure:                     Do not wear jewelry, make up, or nail polish            Do not wear lotions, powders, perfumes, or deodorant.            Do not shave 48 hours prior to surgery.              Do not bring valuables to the hospital.            [facility] is not responsible for any belongings or valuables.  Do NOT Smoke (Tobacco/Vaping) 24 hours prior to your procedure If you use a CPAP at night, you may bring all equipment for your overnight stay.   Contacts, glasses, dentures or bridgework may not be worn into surgery.      For patients admitted to the hospital, discharge time will be determined by your treatment team.   Patients discharged the day of surgery will not be allowed to drive home, and someone needs to stay with them for 24 hours.    Special instructions:   [facility]- Preparing For Surgery  Before surgery, you can play an important role. Because skin is not sterile, your skin needs to be as free of germs as possible. You can reduce the number of germs on your skin by washing with CHG (chlorahexidine gluconate) Soap before surgery.  CHG is an antiseptic cleaner which kills germs and bonds with the skin to continue killing germs even after washing.    Oral Hygiene is also important to reduce your risk of infection.  Remember - BRUSH YOUR TEETH THE MORNING OF SURGERY WITH YOUR REGULAR TOOTHPASTE  Please do not use if you have an allergy to CHG or antibacterial soaps. If your skin becomes reddened/irritated stop using the CHG.  Do not shave (including legs and underarms) for at least 48 hours prior to first CHG shower. It is OK to shave your face.  Please follow these instructions carefully.   Shower the NIGHT BEFORE SURGERY and the MORNING OF SURGERY with DIAL Soap.   Pat yourself dry with a CLEAN TOWEL.  Wear CLEAN PAJAMAS to bed the night before surgery  Place CLEAN SHEETS on your bed the night of your first  shower and DO NOT SLEEP WITH PETS.   Day of Surgery: Please shower morning of surgery  Wear Clean/Comfortable clothing the morning of surgery Do not apply any deodorants/lotions.   Remember to brush your teeth WITH YOUR REGULAR TOOTHPASTE.   Questions were answered. Patient verbalized understanding of instructions.

## 2022-02-25 ENCOUNTER — Ambulatory Visit: Admit: 2022-02-25 | Payer: BC Managed Care – PPO

## 2022-02-25 ENCOUNTER — Ambulatory Visit (HOSPITAL_COMMUNITY): Admission: RE | Admit: 2022-02-25 | Payer: BC Managed Care – PPO | Source: Ambulatory Visit

## 2022-02-25 HISTORY — DX: Other complications of anesthesia, initial encounter: T88.59XA

## 2022-02-25 SURGERY — MRI WITH ANESTHESIA
Anesthesia: General

## 2022-03-12 ENCOUNTER — Telehealth: Payer: Self-pay

## 2022-03-12 NOTE — Telephone Encounter (Signed)
**Note De-Identified Cindi Ghazarian Obfuscation** Per Covermymeds message: THIS MEMBER HAS WORKERS COMPENSATION COVERAGE, [employer] DOES NOT MANAGE PA FOR THESE CLIENTS.  I have notified [facility], [place] (Ph: [phone]) of this outcome and was advised that the pt has already picked her Xarelto up on 03/09/2022 and that no PA was required.

## 2022-03-25 ENCOUNTER — Ambulatory Visit (HOSPITAL_COMMUNITY): Payer: BC Managed Care – PPO

## 2022-04-06 ENCOUNTER — Encounter (HOSPITAL_COMMUNITY): Payer: Self-pay

## 2022-04-06 ENCOUNTER — Encounter: Payer: Self-pay | Admitting: Cardiology

## 2022-04-06 ENCOUNTER — Ambulatory Visit: Payer: BC Managed Care – PPO | Attending: Cardiology | Admitting: Cardiology

## 2022-04-06 ENCOUNTER — Other Ambulatory Visit: Payer: Self-pay

## 2022-04-06 VITALS — BP 132/79 | HR 73 | Ht 63.0 in | Wt 349.0 lb

## 2022-04-06 DIAGNOSIS — I456 Pre-excitation syndrome: Secondary | ICD-10-CM | POA: Diagnosis not present

## 2022-04-06 DIAGNOSIS — R002 Palpitations: Secondary | ICD-10-CM

## 2022-04-06 DIAGNOSIS — I10 Essential (primary) hypertension: Secondary | ICD-10-CM | POA: Diagnosis not present

## 2022-04-06 MED ORDER — RIVAROXABAN 20 MG PO TABS: ORAL_TABLET | ORAL | 6 refills | Status: DC

## 2022-04-06 MED ORDER — LOSARTAN POTASSIUM 100 MG PO TABS: 100.0000 mg | ORAL_TABLET | Freq: Every day | ORAL | 3 refills | Status: DC

## 2022-04-06 NOTE — Anesthesia Preprocedure Evaluation (Signed)
Anesthesia Evaluation  Patient identified by MRN, date of birth, ID band Patient awake    Reviewed: Allergy & Precautions, H&P , NPO status , Patient's Chart, lab work & pertinent test results  Airway Mallampati: II  TM Distance: >3 FB Neck ROM: Full    Dental no notable dental hx.    Pulmonary neg pulmonary ROS   breath sounds clear to auscultation + decreased breath sounds      Cardiovascular hypertension, Pt. on medications Normal cardiovascular exam Rhythm:Regular Rate:Normal  Nuclear stress 01/28/2021:   Findings are consistent with no prior ischemia. The study is low risk.   No ST deviation was noted.   LV perfusion is abnormal. There is a medium defect with moderate reduction in uptake present in the apical to mid inferolateral, lateral and mid inferior LV segments that are fixed. There is normal wall motion in the defect area. May be related to artifact due to extracardiac tracer uptake and diaphragmatic attentuation.   Left ventricular function is normal. Nuclear stress EF: 63 %. The left ventricular ejection fraction is normal (55-65%). End diastolic cavity size is normal.   Prior study not available for comparison.      Neuro/Psych negative neurological ROS  negative psych ROS   GI/Hepatic negative GI ROS, Neg liver ROS,,,  Endo/Other    Morbid obesity  Renal/GU negative Renal ROS  negative genitourinary   Musculoskeletal negative musculoskeletal ROS (+)    Abdominal  (+) + obese  Peds negative pediatric ROS (+)  Hematology negative hematology ROS (+)   Anesthesia Other Findings   Reproductive/Obstetrics negative OB ROS                             Anesthesia Physical Anesthesia Plan  ASA: 3  Anesthesia Plan: General   Post-op Pain Management: Minimal or no pain anticipated   Induction: Intravenous  PONV Risk Score and Plan: 3 and Ondansetron, Dexamethasone and  Treatment may vary due to age or medical condition  Airway Management Planned: Oral ETT  Additional Equipment:   Intra-op Plan:   Post-operative Plan: Extubation in OR  Informed Consent: I have reviewed the patients History and Physical, chart, labs and discussed the procedure including the risks, benefits and alternatives for the proposed anesthesia with the patient or authorized representative who has indicated his/her understanding and acceptance.     Dental advisory given  Plan Discussed with: CRNA and Surgeon  Anesthesia Plan Comments: (PAT note written 04/06/2022 by Myra Gianotti, PA-C.  )       Anesthesia Quick Evaluation

## 2022-04-06 NOTE — Progress Notes (Signed)
Anesthesia Chart Review: Mia [name]  Case: [id number] Date/Time: 04/08/22 0945   Procedure: CT ABDOMEN WITH AND WITHOUT CONTRAST   Anesthesia type: General   Pre-op diagnosis: RENAL CYST   Location: [facility] OR RADIOLOGY ROOM / [place] [facility]   Surgeons: Radiologist, Medication, MD       DISCUSSION: Patient is a 67 year old female scheduled for the above procedure. CT scan was ordered by Dr. Irine Seal for further evaluation of a complex renal cyst. Office visit/H&P note by Jiles Crocker, NP on 03/29/22 is scanned under Media tab.   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   Last cardiology evaluation was on 04/06/22 with Dr. [name]. Doing well for intermittent WPW history, and no medication changes made. Low risk stress test on 01/27/21. 01/2021 event monitor for palpitations showed SR, rare PAC/PVC, no afib or pauses. Continue current therapy for HTN and HLD. On Xarelto for DVT/PE history. Seeing GI for severe constipation. Noted she is undergoing evaluation for some "urologic issues" and wrote, "May proceed with urologic procedure from a cardiac standpoint." One year cardiology follow-up planned.     Non-insulin-dependent DM2, A1c 6.3% on 02/04/2022.   She is for labs as indicated on arrival. Anesthesia team to evaluate on the day of procedure. Test is ordered as just a CT abdomen with and without contrast, so she would not need to hold Xarelto from an anesthesia standpoint. 04/06/22 EKG done at [facility] is not yet viewable in [facility].     VS: LMP 04/10/2010  Wt Readings from Last 3 Encounters:  04/06/22 (!) 158.3 kg  02/04/22 (!) 157.4 kg  12/17/21 (!) 154.2 kg   BP Readings from Last 3 Encounters:  04/06/22 132/79  02/04/22 138/82  12/17/21 (!) 144/71   Pulse Readings from Last 3 Encounters:   04/06/22 73  02/04/22 75  12/17/21 80    PROVIDERS: Biagio Borg, MD is PCP  Candee Furbish, MD is cardiologist Irine Seal, MD is urologist   LABS: For labs as indicated prior to procedure. Most recent results in [facility] include: Lab Results  Component Value Date   WBC 9.6 08/04/2021   HGB 14.1 08/04/2021   HCT 43.3 08/04/2021   PLT 212.0 08/04/2021   GLUCOSE 82 02/04/2022   ALT 10 02/04/2022   AST 12 02/04/2022   NA 142 02/04/2022   K 4.0 02/04/2022   CL 107 02/04/2022   CREATININE 0.72 02/04/2022   BUN 15 02/04/2022   CO2 30 02/04/2022   TSH 1.33 08/04/2021   HGBA1C 6.3 02/04/2022    IMAGES: US Renal 08/07/21: IMPRESSION: - Limited assessment on this renal sonogram with no signs of hydronephrosis or large renal calculi. - Mildly complex cystic renal lesion on the LEFT is likely a cyst but is not well assessed. Consider follow-up CT imaging even without contrast to ensure that this represents the same area that was seen on imaging from 2012. This could be performed in 6 months. Alternatively a repeat ultrasound could be performed in 6 months to assess for stability.     EKG: Per 04/06/22 office note by Dr. [name], "04/06/2022-sinus rhythm 73 poor R wave progression PR interval 188 ms Prior sinus rhythm 87 QRS duration 116 with subtle upsloping QRS.  PR interval 132 ms."   CV: Event monitor 01/16/2021 - 01/29/2021: Sinus rhythm avg 76 bpm Rare PAC, PVC  No atrial fibrillation, no pauses Reassuring monitor    Nuclear stress 01/28/2021:   Findings are consistent with no prior ischemia. The study is low risk.   No ST deviation was noted.   LV perfusion is abnormal. There is a medium defect with moderate reduction in uptake present in the apical to mid inferolateral, lateral and mid inferior LV segments that are fixed. There is normal wall motion in the defect area. May be related to artifact due to extracardiac tracer uptake and diaphragmatic attentuation.   Left  ventricular function is normal. Nuclear stress EF: 63 %. The left ventricular ejection fraction is normal (55-65%). End diastolic cavity size is normal.   Prior study not available for comparison.   Echo 01/10/2010:  Study Conclusions   - Left ventricle: The cavity size was normal. There was mild     concentric hypertrophy. Systolic function was normal. The     estimated ejection fraction was in the range of 55% to 60%.   - Left atrium: The atrium was mildly dilated.   - Pulmonary arteries: Systolic pressure was mildly increased. PA     peak pressure: 52mm Hg (S).   Impressions:   - Extremely limited due to poor sound wave transmission; LV function     appears to be preserved; focal wall motion abnormality cannot be     excluded; right heart not well visualized; suggest TEE or MUGA if     clinically indicated.    Past Medical History:  Diagnosis Date   Allergic rhinitis, cause unspecified 04/29/2011   Anemia    Anemia, unspecified 04/23/2011   Back pain    Bilateral swelling of feet and ankles    Chronic anticoagulation    Chronic headaches    Colon polyps [id number]   Complication of anesthesia    hard to wake up   Constipation    Diverticulosis    DVT (deep venous thrombosis)    right upper extremitiy   GERD (gastroesophageal reflux disease) 11/10/2011   Gout 04/22/2010   no current problems per patient on 12/16/21   H/O blood clots    H/O: GI bleed    Hyperlipidemia    Hyperplastic colon polyp    Hypertension    IBS (irritable bowel syndrome)    Infected prosthetic knee joint    Joint pain    Migraine 04/29/2011   otc meds prn   Morbid obesity    Nonsmoker    Other fatigue    Pre-diabetes    no meds   Pulmonary embolism 2010   following right knee replacement   Shortness of breath    with exertion   SOBOE (shortness of breath on exertion)    Vitamin D deficiency    WPW (Wolff-Parkinson-White syndrome)     Past Surgical History:  Procedure Laterality Date    CHOLECYSTECTOMY     open surgery with abd incision   COLONOSCOPY     x 7   DIAGNOSTIC LAPAROSCOPY Left    knee   DIAGNOSTIC LAPAROSCOPY Right    right wrist surgery for cyst   KNEE ARTHROSCOPY Left    MULTIPLE TOOTH EXTRACTIONS     all upper teeth extracted - upper partial   TOTAL KNEE ARTHROPLASTY Right 01/05/2008   followed by I & D for infection  x 2   TUBAL LIGATION     WISDOM TOOTH EXTRACTION      MEDICATIONS: No current facility-administered medications for this encounter.    acetaminophen (TYLENOL) 650 MG  CR tablet   atorvastatin (LIPITOR) 80 MG tablet   bisacodyl (DULCOLAX) 5 MG EC tablet   carvedilol (COREG) 6.25 MG tablet   Cholecalciferol (VITAMIN D3) 50 MCG (2000 UT) CAPS   meclizine (ANTIVERT) 12.5 MG tablet   polyethylene glycol powder (GLYCOLAX/MIRALAX) 17 GM/SCOOP powder   spironolactone (ALDACTONE) 25 MG tablet   losartan (COZAAR) 100 MG tablet   rivaroxaban (XARELTO) 20 MG TABS tablet    Myra Gianotti, PA-C Surgical Short Stay/Anesthesiology [facility] Phone [phone] [facility] Phone [phone] 04/06/2022 11:54 AM

## 2022-04-06 NOTE — Progress Notes (Signed)
Cardiology Office Note:    Date:  04/06/2022   ID:  Vilma, Buckett Jul 28, 1955, MRN [id number]  PCP:  Biagio Borg, MD  [facility] Cardiologist:  Candee Furbish, MD  [facility] Electrophysiologist:  None   Referring MD: Biagio Borg, MD     History of Present Illness:    Mia [name] is a 67 y.o. 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  Former patient of Dr. Sherryl Barters.  Prior knee right prosthesis was infected.    She does take spironolactone.  IBS type symptoms. Checks BP every night.  Knee and back pain hurts exercise. Had been to [facility]. Management.  Was not successful.   Still having knee pain.  Occasional palpitations when cleaning house or doing yard work picking up sticks for instance.  Short, fleeting.  No syncope.  No chest pain.  Back issues.  Has been having some urologic issues.  Past Medical History:  Diagnosis Date   Allergic rhinitis, cause unspecified 04/29/2011   Anemia    Anemia, unspecified 04/23/2011   Back pain    Bilateral swelling of feet and ankles    Chronic anticoagulation    Chronic headaches    Colon polyps [id number]   Complication of anesthesia    hard to wake up   Constipation    Diverticulosis    DVT (deep venous thrombosis)    right upper extremitiy   GERD (gastroesophageal reflux disease) 11/10/2011   Gout 04/22/2010   no current problems per patient on 12/16/21   H/O blood clots    H/O: GI bleed    Hyperlipidemia    Hyperplastic colon polyp    Hypertension    IBS (irritable bowel syndrome)    Infected prosthetic knee joint    Joint pain    Migraine 04/29/2011   otc meds prn   Morbid obesity    Nonsmoker    Other fatigue    Pre-diabetes    no meds   Pulmonary embolism 2010   following right knee replacement   Shortness of breath    with exertion   SOBOE (shortness of breath on exertion)     Vitamin D deficiency    WPW (Wolff-Parkinson-White syndrome)     Past Surgical History:  Procedure Laterality Date   CHOLECYSTECTOMY     open surgery with abd incision   COLONOSCOPY     x 7   DIAGNOSTIC LAPAROSCOPY Left    knee   DIAGNOSTIC LAPAROSCOPY Right    right wrist surgery for cyst   KNEE ARTHROSCOPY Left    MULTIPLE TOOTH EXTRACTIONS     all upper teeth extracted - upper partial   TOTAL KNEE ARTHROPLASTY Right 01/05/2008   followed by I & D for infection  x 2   TUBAL LIGATION     WISDOM TOOTH EXTRACTION      Current Medications: Current Meds  Medication Sig   acetaminophen (TYLENOL) 650 MG CR tablet Take 1,300 mg by mouth at bedtime.   atorvastatin (LIPITOR) 80 MG tablet Take 1 tablet (80 mg total) by mouth daily.   bisacodyl (DULCOLAX) 5 MG EC tablet Take 15 mg by mouth daily as needed for moderate constipation.   carvedilol (COREG) 6.25 MG tablet TAKE 1 TABLET(6.25 MG) BY MOUTH TWICE DAILY   Cholecalciferol (VITAMIN D3) 50 MCG (2000 UT) CAPS Take 4,000 Units by mouth daily.  meclizine (ANTIVERT) 12.5 MG tablet Take 1 tablet (12.5 mg total) by mouth 3 (three) times daily as needed for dizziness.   polyethylene glycol powder (GLYCOLAX/MIRALAX) 17 GM/SCOOP powder Take twice daily ( 17gr) until stooling regular, then once daily   spironolactone (ALDACTONE) 25 MG tablet TAKE 1 TABLET(25 MG) BY MOUTH DAILY   [DISCONTINUED] losartan (COZAAR) 100 MG tablet TAKE 1 TABLET BY MOUTH DAILY   [DISCONTINUED] XARELTO 20 MG TABS tablet TAKE 1 TABLET(20 MG) BY MOUTH DAILY WITH SUPPER     Allergies:   Crestor [rosuvastatin calcium], Amlodipine, Ultram [tramadol], and Zosyn [piperacillin sod-tazobactam so]   Social History   Socioeconomic History   Marital status: Soil scientist    Spouse name: Camera operator   Number of children: 2   Years of education: Not on file   Highest education level: Not on file  Occupational History   Occupation: TEACHER ASST    Employer: [employer]  Tobacco Use   Smoking status: Never   Smokeless tobacco: Never  Vaping Use   Vaping Use: Never used  Substance and Sexual Activity   Alcohol use: No   Drug use: No   Sexual activity: Not Currently    Partners: Male    Birth control/protection: Post-menopausal  Other Topics Concern   Not on file  Social History Narrative   Not on file   Social Determinants of Health   Financial Resource Strain: Not on file  Food Insecurity: Not on file  Transportation Needs: Not on file  Physical Activity: Not on file  Stress: Not on file  Social Connections: Not on file     Family History: The patient's family history includes Colon cancer in an other family member; Coronary artery disease in an other family member; Diabetes in her father; Heart Problems in her mother; Heart disease in her mother and another family member; Hypertension in her brother, sister, sister, and sister; Pancreatic cancer in her sister; Throat cancer in her father.  ROS:   Please see the history of present illness.     All other systems reviewed and are negative.  EKGs/Labs/Other Studies Reviewed:    The following studies were reviewed today:  Echo 2012:  - Left ventricle: The cavity size was normal. There was mild     concentric hypertrophy. Systolic function was normal. The     estimated ejection fraction was in the range of 55% to 60%.   - Left atrium: The atrium was mildly dilated.   - Pulmonary arteries: Systolic pressure was mildly increased. PA     peak pressure: 66mm Hg (S).   Impressions:    - Extremely limited due to poor sound wave transmission; LV function     appears to be preserved; focal wall motion abnormality cannot be     excluded; right heart not well visualized; suggest TEE or MUGA if     clinically indicated.    EKG:   04/06/2022-sinus rhythm 73 poor R wave progression PR interval 188 ms Prior sinus rhythm 87 QRS duration 116 with subtle upsloping QRS.  PR interval 132  ms.  Recent Labs: 08/04/2021: Hemoglobin 14.1; Platelets 212.0; TSH 1.33 02/04/2022: ALT 10; BUN 15; Creatinine, Ser 0.72; Potassium 4.0; Sodium 142  Recent Lipid Panel    Component Value Date/Time   CHOL 170 02/04/2022 1357   CHOL 186 11/24/2020 1523   TRIG 129.0 02/04/2022 1357   HDL 38.90 (L) 02/04/2022 1357   HDL 43 11/24/2020 1523   CHOLHDL 4 02/04/2022 1357  VLDL 25.8 02/04/2022 1357   LDLCALC 105 (H) 02/04/2022 1357   LDLCALC 119 (H) 11/24/2020 1523   LDLCALC 153 (H) 08/09/2019 1439   LDLDIRECT 148.9 12/11/2012 0934      Physical Exam:    VS:  BP 132/79 (BP Location: Left Arm, Patient Position: Sitting, Cuff Size: Normal)   Pulse 73   Ht 5\' 3"  (1.6 m)   Wt (!) 349 lb (158.3 kg)   LMP 04/10/2010   BMI 61.82 kg/m     Wt Readings from Last 3 Encounters:  04/06/22 (!) 349 lb (158.3 kg)  02/04/22 (!) 347 lb (157.4 kg)  12/17/21 (!) 340 lb (154.2 kg)     GEN: Well nourished, well developed, in no acute distress HEENT: normal Neck: no JVD, carotid bruits, or masses Cardiac: RRR; no murmurs, rubs, or gallops,no edema  Respiratory:  clear to auscultation bilaterally, normal work of breathing GI: soft, nontender, nondistended, + BS MS: no deformity or atrophy Skin: warm and dry, no rash Neuro:  Alert and Oriented x 3, Strength and sensation are intact Psych: euthymic mood, full affect   ASSESSMENT:    1. Primary hypertension   2. Palpitations   3. Wolff-Parkinson-White (WPW) syndrome   4. Morbid obesity, unspecified obesity type     PLAN:    In order of problems listed above:  WPW pattern on ECG intermittently, mild - Asymptomatic.  In the past have been avoiding beta-blockers.  This was diagnosed previously by Dr. Mare Ferrari.  Overall doing well.  No changes made.  Hypertensive heart disease without heart failure -  Continues with good control.  On carvedilol 6.25 mg twice a day, losartan 100 mg a day.  Spironolactone 25 mg a day.  Hyperlipidemia - LDL  previously 204.  Currently on high intensity statin 80 mg atorvastatin. Latest LDL 105.  No myalgias.  Continue with current medication management.  History of DVT/PE - Xarelto, getting patient assistance.  Excellent.  Coumadin in the past was difficult to manage.  Lab work reviewed, hemoglobin 14.1 creatinine 0.7  Severe constipation - Dr. Ardis Hughs has seen her in the past, IBS, fiber, MiraLAX.  Apples have helped.  Changing her diet have helped significantly.  Still struggling with this however.  Morbid obesity - Continue to encourage weight loss. Nutritionist.  She states that she does not eat much at all.  Challenging.  Continue to monitor.  May proceed with urologic procedure from a cardiac standpoint.      Medication Adjustments/Labs and Tests Ordered: Current medicines are reviewed at length with the patient today.  Concerns regarding medicines are outlined above.  Orders Placed This Encounter  Procedures   EKG 12-Lead   Meds ordered this encounter  Medications   rivaroxaban (XARELTO) 20 MG TABS tablet    Sig: TAKE 1 TABLET(20 MG) BY MOUTH DAILY WITH SUPPER    Dispense:  30 tablet    Refill:  6   losartan (COZAAR) 100 MG tablet    Sig: Take 1 tablet (100 mg total) by mouth daily.    Dispense:  90 tablet    Refill:  3    Patient Instructions  Medication Instructions:  The current medical regimen is effective;  continue present plan and medications.  *If you need a refill on your cardiac medications before your next appointment, please call your pharmacy*  Follow-Up: At [facility], you and your health needs are our priority.  As part of our continuing mission to provide you with exceptional heart care,  we have created designated Provider Care Teams.  These Care Teams include your primary Cardiologist (physician) and Advanced Practice Providers (APPs -  Physician Assistants and Nurse Practitioners) who all work together to provide you with the care you need, when  you need it.  We recommend signing up for the patient portal called "MyChart".  Sign up information is provided on this After Visit Summary.  MyChart is used to connect with patients for Virtual Visits (Telemedicine).  Patients are able to view lab/test results, encounter notes, upcoming appointments, etc.  Non-urgent messages can be sent to your provider as well.   To learn more about what you can do with MyChart, go to [email or web address].    Your next appointment:   1 year(s)  Provider:   Candee Furbish, MD        Signed, Candee Furbish, MD  04/06/2022 10:23 AM    [place]

## 2022-04-06 NOTE — Patient Instructions (Signed)
Medication Instructions:  The current medical regimen is effective;  continue present plan and medications.  *If you need a refill on your cardiac medications before your next appointment, please call your pharmacy*  Follow-Up: At [facility], you and your health needs are our priority.  As part of our continuing mission to provide you with exceptional heart care, we have created designated Provider Care Teams.  These Care Teams include your primary Cardiologist (physician) and Advanced Practice Providers (APPs -  Physician Assistants and Nurse Practitioners) who all work together to provide you with the care you need, when you need it.  We recommend signing up for the patient portal called "MyChart".  Sign up information is provided on this After Visit Summary.  MyChart is used to connect with patients for Virtual Visits (Telemedicine).  Patients are able to view lab/test results, encounter notes, upcoming appointments, etc.  Non-urgent messages can be sent to your provider as well.   To learn more about what you can do with MyChart, go to [email or web address].    Your next appointment:   1 year(s)  Provider:   Mark Skains, MD      

## 2022-04-06 NOTE — Progress Notes (Signed)
SDW call  Patient was given pre-op instructions over the phone. Patient verbalized understanding of instructions provided.     PCP - Dr. Cathlean Cower Cardiologist - Dr. Candee Furbish.  Patient saw Cardiology today 04/06/2022 and clearance for procedure obtained.    PPM/ICD - Denies  Chest x-ray - n/a EKG -  DOS, 04/08/2022 Stress Test -01/28/2021 ECHO - 01/10/2010 Cardiac Cath - Denies  Sleep Study/sleep apnea/CPAP: Denies  Non-diabetic  Blood Thinner Instructions: On Xarelto, takes daily.  Spoke with Myra Gianotti PA-C, patient does not need to stop Xarelto prior to procedure Aspirin Instructions: Denies   ERAS Protcol - Yes, clear liquids until 3 hours prior to procedure. Last PO fluids 0700 PRE-SURGERY Ensure or G2-    COVID TEST- Denies    Anesthesia review: Yes. WPW, GI bleed, HTN, SOB, "hard to wake up", BMI 61.82   Patient denies shortness of breath, fever, cough and chest pain over the phone call    Your procedure is scheduled on Thursday April 08, 2022  Report to [facility] Entrance "A" at 0730 A.M., then check in with the Admitting office.  Call this number if you have problems the morning of surgery:  [phone]   If you have any questions prior to your surgery date call [phone]: Open Monday-Friday 8am-4pm If you experience any cold or flu symptoms such as cough, fever, chills, shortness of breath, etc. between now and your scheduled surgery, please notify us at the above number     Remember:  Do not eat after midnight the night before your surgery  You may drink clear liquids until 0700 the morning of your surgery.   Clear liquids allowed are: Water, Non-Citrus Juices (without pulp), Carbonated Beverages, Clear Tea, Black Coffee ONLY (NO MILK, CREAM OR POWDERED CREAMER of any kind), and Gatorade   Take these medicines the morning of surgery with A SIP OF WATER:  Lipitor, Coreg  As needed: Meclizine  As of today, STOP taking any Aspirin (unless  otherwise instructed by your surgeon) Aleve, Naproxen, Ibuprofen, Motrin, Advil, Goody's, BC's, all herbal medications, fish oil, and all vitamins.

## 2022-04-07 NOTE — Progress Notes (Signed)
Patient notified her surgery time for tomorrow has changed. Patient notified to arrive at 9:00 am 04/08/22.

## 2022-04-08 ENCOUNTER — Encounter (HOSPITAL_COMMUNITY): Payer: Self-pay | Admitting: *Deleted

## 2022-04-08 ENCOUNTER — Other Ambulatory Visit: Payer: Self-pay

## 2022-04-08 ENCOUNTER — Ambulatory Visit (HOSPITAL_COMMUNITY): Payer: BC Managed Care – PPO | Admitting: Vascular Surgery

## 2022-04-08 ENCOUNTER — Ambulatory Visit (HOSPITAL_COMMUNITY)
Admission: RE | Admit: 2022-04-08 | Discharge: 2022-04-08 | Disposition: A | Discharge status: Home / Self Care | Payer: BC Managed Care – PPO | Attending: Urology | Admitting: Urology

## 2022-04-08 ENCOUNTER — Encounter (HOSPITAL_COMMUNITY)
Admission: RE | Disposition: A | Discharge status: Home / Self Care | Payer: Self-pay | Source: Home / Self Care | Attending: *Deleted

## 2022-04-08 ENCOUNTER — Ambulatory Visit (HOSPITAL_COMMUNITY)
Admission: RE | Admit: 2022-04-08 | Discharge: 2022-04-08 | Disposition: A | Discharge status: Home / Self Care | Payer: BC Managed Care – PPO | Source: Ambulatory Visit | Attending: Urology | Admitting: Urology

## 2022-04-08 DIAGNOSIS — Z6841 Body Mass Index (BMI) 40.0 and over, adult: Secondary | ICD-10-CM | POA: Diagnosis not present

## 2022-04-08 DIAGNOSIS — K5900 Constipation, unspecified: Secondary | ICD-10-CM | POA: Diagnosis not present

## 2022-04-08 DIAGNOSIS — N281 Cyst of kidney, acquired: Secondary | ICD-10-CM

## 2022-04-08 DIAGNOSIS — I119 Hypertensive heart disease without heart failure: Secondary | ICD-10-CM | POA: Diagnosis not present

## 2022-04-08 DIAGNOSIS — N200 Calculus of kidney: Secondary | ICD-10-CM | POA: Insufficient documentation

## 2022-04-08 DIAGNOSIS — Z7901 Long term (current) use of anticoagulants: Secondary | ICD-10-CM | POA: Insufficient documentation

## 2022-04-08 DIAGNOSIS — Z79899 Other long term (current) drug therapy: Secondary | ICD-10-CM | POA: Insufficient documentation

## 2022-04-08 DIAGNOSIS — E785 Hyperlipidemia, unspecified: Secondary | ICD-10-CM | POA: Insufficient documentation

## 2022-04-08 DIAGNOSIS — I456 Pre-excitation syndrome: Secondary | ICD-10-CM | POA: Insufficient documentation

## 2022-04-08 DIAGNOSIS — Z86718 Personal history of other venous thrombosis and embolism: Secondary | ICD-10-CM | POA: Diagnosis not present

## 2022-04-08 DIAGNOSIS — Z96651 Presence of right artificial knee joint: Secondary | ICD-10-CM | POA: Diagnosis not present

## 2022-04-08 HISTORY — PX: RADIOLOGY WITH ANESTHESIA: SHX6223

## 2022-04-08 LAB — BASIC METABOLIC PANEL
Anion gap: 10 (ref 5–15)
BUN: 15 mg/dL (ref 8–23)
CO2: 21 mmol/L — ABNORMAL LOW (ref 22–32)
Calcium: 10 mg/dL (ref 8.9–10.3)
Chloride: 108 mmol/L (ref 98–111)
Creatinine, Ser: 0.75 mg/dL (ref 0.44–1.00)
GFR, Estimated: >60 mL/min (ref >60)
Glucose, Bld: 118 mg/dL — ABNORMAL HIGH (ref 70–99)
Potassium: 4.1 mmol/L (ref 3.5–5.1)
Sodium: 139 mmol/L (ref 135–145)

## 2022-04-08 LAB — CBC
HCT: 42.6 % (ref 36.0–46.0)
Hemoglobin: 13.8 g/dL (ref 12.0–15.0)
MCH: 27.9 pg (ref 26.0–34.0)
MCHC: 32.4 g/dL (ref 30.0–36.0)
MCV: 86.2 fL (ref 80.0–100.0)
Platelets: 242 10*3/uL (ref 150–400)
RBC: 4.94 MIL/uL (ref 3.87–5.11)
RDW: 14.7 % (ref 11.5–15.5)
WBC: 8 10*3/uL (ref 4.0–10.5)
nRBC: 0 % (ref 0.0–0.2)

## 2022-04-08 SURGERY — MRI WITH ANESTHESIA
Anesthesia: General

## 2022-04-08 MED ORDER — PHENYLEPHRINE 80 MCG/ML (10ML) SYRINGE FOR IV PUSH (FOR BLOOD PRESSURE SUPPORT)
PREFILLED_SYRINGE | INTRAVENOUS | Status: DC | PRN
  Administered 2022-04-08: 160 ug via INTRAVENOUS

## 2022-04-08 MED ORDER — FENTANYL CITRATE (PF) 250 MCG/5ML IJ SOLN
INTRAMUSCULAR | Status: DC | PRN
  Administered 2022-04-08: 100 ug via INTRAVENOUS

## 2022-04-08 MED ORDER — PROPOFOL 10 MG/ML IV BOLUS
INTRAVENOUS | Status: DC | PRN
  Administered 2022-04-08: 120 mg via INTRAVENOUS

## 2022-04-08 MED ORDER — CHLORHEXIDINE GLUCONATE 0.12 % MT SOLN
15.0000 mL | Freq: Once | OROMUCOSAL | Status: AC
  Administered 2022-04-08: 15 mL via OROMUCOSAL
  Filled 2022-04-08: qty 15

## 2022-04-08 MED ORDER — FENTANYL CITRATE (PF) 100 MCG/2ML IJ SOLN: 25.0000 ug | INTRAMUSCULAR | Status: DC | PRN

## 2022-04-08 MED ORDER — CARVEDILOL 3.125 MG PO TABS
ORAL_TABLET | ORAL | Status: AC
  Filled 2022-04-08: qty 2

## 2022-04-08 MED ORDER — MIDAZOLAM HCL 2 MG/2ML IJ SOLN
INTRAMUSCULAR | Status: DC | PRN
  Administered 2022-04-08: 2 mg via INTRAVENOUS

## 2022-04-08 MED ORDER — CARVEDILOL 3.125 MG PO TABS
6.2500 mg | ORAL_TABLET | Freq: Once | ORAL | Status: AC
  Administered 2022-04-08: 6.25 mg via ORAL

## 2022-04-08 MED ORDER — ORAL CARE MOUTH RINSE: 15.0000 mL | Freq: Once | OROMUCOSAL | Status: AC

## 2022-04-08 MED ORDER — MIDAZOLAM HCL 2 MG/2ML IJ SOLN
INTRAMUSCULAR | Status: AC
  Filled 2022-04-08: qty 2

## 2022-04-08 MED ORDER — IOHEXOL 350 MG/ML SOLN
75.0000 mL | Freq: Once | INTRAVENOUS | Status: AC | PRN
  Administered 2022-04-08: 75 mL via INTRAVENOUS

## 2022-04-08 MED ORDER — EPHEDRINE SULFATE-NACL 50-0.9 MG/10ML-% IV SOSY
PREFILLED_SYRINGE | INTRAVENOUS | Status: DC | PRN
  Administered 2022-04-08: 10 mg via INTRAVENOUS

## 2022-04-08 MED ORDER — SUCCINYLCHOLINE CHLORIDE 200 MG/10ML IV SOSY
PREFILLED_SYRINGE | INTRAVENOUS | Status: DC | PRN
  Administered 2022-04-08: 140 mg via INTRAVENOUS

## 2022-04-08 MED ORDER — ONDANSETRON HCL 4 MG/2ML IJ SOLN: 4.0000 mg | Freq: Once | INTRAMUSCULAR | Status: DC | PRN

## 2022-04-08 MED ORDER — FENTANYL CITRATE (PF) 250 MCG/5ML IJ SOLN
INTRAMUSCULAR | Status: AC
  Filled 2022-04-08: qty 5

## 2022-04-08 MED ORDER — LACTATED RINGERS IV SOLN: INTRAVENOUS | Status: DC

## 2022-04-08 MED ORDER — LIDOCAINE 2% (20 MG/ML) 5 ML SYRINGE
INTRAMUSCULAR | Status: DC | PRN
  Administered 2022-04-08: 100 mg via INTRAVENOUS

## 2022-04-08 NOTE — Anesthesia Postprocedure Evaluation (Signed)
Anesthesia Post Note  Patient: Mia [name]  Procedure(s) Performed: CT ABDOMEN WITH AND WITHOUT CONTRAST     Patient location during evaluation: PACU Anesthesia Type: General Level of consciousness: awake and alert Pain management: pain level controlled Vital Signs Assessment: post-procedure vital signs reviewed and stable Respiratory status: spontaneous breathing, nonlabored ventilation, respiratory function stable and patient connected to nasal cannula oxygen Cardiovascular status: blood pressure returned to baseline and stable Postop Assessment: no apparent nausea or vomiting Anesthetic complications: no  No notable events documented.  Last Vitals:  Vitals:   04/08/22 0912 04/08/22 1200  BP: (!) 170/98 126/88  Pulse:  77  Resp:  18  Temp:    SpO2:  98%    Last Pain:  Vitals:   04/08/22 0912  TempSrc:   PainSc: 9                  Glennice Marcos S

## 2022-04-08 NOTE — Anesthesia Procedure Notes (Signed)
Procedure Name: Intubation Date/Time: 04/08/2022 10:58 AM  Performed by: Elvin So, CRNAPre-anesthesia Checklist: Patient identified, Emergency Drugs available, Suction available and Patient being monitored Patient Re-evaluated:Patient Re-evaluated prior to induction Oxygen Delivery Method: Circle System Utilized Preoxygenation: Pre-oxygenation with 100% oxygen Induction Type: IV induction Ventilation: Mask ventilation without difficulty Laryngoscope Size: McGraph, 4 and 3 Grade View: Grade I Tube type: Oral Tube size: 7.0 mm Number of attempts: 1 Airway Equipment and Method: Stylet and Oral airway Placement Confirmation: ETT inserted through vocal cords under direct vision, positive ETCO2 and breath sounds checked- equal and bilateral Secured at: 22 cm Tube secured with: Tape Dental Injury: Teeth and Oropharynx as per pre-operative assessment

## 2022-04-08 NOTE — Transfer of Care (Signed)
Immediate Anesthesia Transfer of Care Note  Patient: Mia [name]  Procedure(s) Performed: CT ABDOMEN WITH AND WITHOUT CONTRAST  Patient Location: PACU  Anesthesia Type:General  Level of Consciousness: awake and patient cooperative  Airway & Oxygen Therapy: Patient Spontanous Breathing and Patient connected to face mask oxygen  Post-op Assessment: Report given to RN, Post -op Vital signs reviewed and stable, and Patient moving all extremities  Post vital signs: Reviewed and stable  Last Vitals:  Vitals Value Taken Time  BP 115/74 04/08/22 1159  Temp    Pulse 77 04/08/22 1159  Resp 18 04/08/22 1159  SpO2 98 % 04/08/22 1159  Vitals shown include unvalidated device data.  Last Pain:  Vitals:   04/08/22 0912  TempSrc:   PainSc: 9       Patients Stated Pain Goal: 4 ([id number])  Complications: No notable events documented.

## 2022-04-09 ENCOUNTER — Encounter (HOSPITAL_COMMUNITY): Payer: Self-pay | Admitting: Radiology

## 2022-04-12 ENCOUNTER — Telehealth: Payer: Self-pay

## 2022-04-12 NOTE — Telephone Encounter (Signed)
**Note De-Identified Bowyn Mercier Obfuscation** Tommy Medal (KeyLarence Penning) Rx #: [id number] Xarelto 20MG  tablets  Form [employer] Electronic PA Form [phone] NCPDP) Determination Message from Plan THIS MEMBER HAS WORKERS COMPENSATION COVERAGE, [employer] DOES NOT MANAGE PA FOR THESE CLIENTS.  I have notified [id number] - [place], [place] - [address] AT [facility] OF [address] (Ph: [phone]) of this approval.

## 2022-04-13 ENCOUNTER — Telehealth: Payer: Self-pay | Admitting: *Deleted

## 2022-04-13 DIAGNOSIS — Z86718 Personal history of other venous thrombosis and embolism: Secondary | ICD-10-CM

## 2022-04-13 NOTE — Telephone Encounter (Signed)
Pt called regarding her refill for Xarelto. Per pt, pharmacy still waiting on a P/A.  Per Larita Fife Via, LPN's note, she contacted [facility] with it yesterday.  I placed call to [facility], they have not knowledge of it.  She did say they may can give a couple of days supply until it gets figured out.  Larita Fife is out until 04/19/22.  I will send to the Pharmacy Team to see if they can help.

## 2022-04-14 ENCOUNTER — Other Ambulatory Visit: Payer: Self-pay | Admitting: Cardiology

## 2022-04-14 NOTE — Telephone Encounter (Signed)
This is a duplicate request, Xarelto was already refilled on 04/08/22, receipt confirmed by pharmacy.

## 2022-04-15 ENCOUNTER — Telehealth: Payer: Self-pay

## 2022-04-15 ENCOUNTER — Other Ambulatory Visit (HOSPITAL_COMMUNITY): Payer: Self-pay

## 2022-04-16 ENCOUNTER — Other Ambulatory Visit (HOSPITAL_COMMUNITY): Payer: Self-pay

## 2022-04-21 MED ORDER — RIVAROXABAN 20 MG PO TABS
ORAL_TABLET | ORAL | 0 refills | Status: DC
Start: 2022-04-21 — End: 2022-10-14

## 2022-04-21 NOTE — Telephone Encounter (Signed)
I touched base with pt. She stated she did finally get it through her workers [employer], but she was out for 5 days.  She said the case worker did say if we would send it in for 90 days she will go ahead an approve it that way pt want run out again.   Will send to the Pharm team to take care of.   Pt was appreciative of the follow up call.

## 2022-04-21 NOTE — Telephone Encounter (Signed)
90 day supply sent to pharmacy

## 2022-08-05 ENCOUNTER — Ambulatory Visit: Payer: BC Managed Care – PPO | Admitting: Internal Medicine

## 2022-08-05 VITALS — BP 130/80 | HR 71 | Temp 97.9°F | Ht 63.0 in | Wt 352.0 lb

## 2022-08-05 DIAGNOSIS — E785 Hyperlipidemia, unspecified: Secondary | ICD-10-CM

## 2022-08-05 DIAGNOSIS — K5900 Constipation, unspecified: Secondary | ICD-10-CM | POA: Diagnosis not present

## 2022-08-05 DIAGNOSIS — I700 Atherosclerosis of aorta: Secondary | ICD-10-CM

## 2022-08-05 DIAGNOSIS — E559 Vitamin D deficiency, unspecified: Secondary | ICD-10-CM

## 2022-08-05 DIAGNOSIS — Z5181 Encounter for therapeutic drug level monitoring: Secondary | ICD-10-CM

## 2022-08-05 DIAGNOSIS — R739 Hyperglycemia, unspecified: Secondary | ICD-10-CM | POA: Diagnosis not present

## 2022-08-05 DIAGNOSIS — E538 Deficiency of other specified B group vitamins: Secondary | ICD-10-CM

## 2022-08-05 DIAGNOSIS — K219 Gastro-esophageal reflux disease without esophagitis: Secondary | ICD-10-CM

## 2022-08-05 DIAGNOSIS — Z0001 Encounter for general adult medical examination with abnormal findings: Secondary | ICD-10-CM

## 2022-08-05 DIAGNOSIS — I5032 Chronic diastolic (congestive) heart failure: Secondary | ICD-10-CM

## 2022-08-05 LAB — BASIC METABOLIC PANEL
BUN: 13 mg/dL (ref 6–23)
CO2: 31 mEq/L (ref 19–32)
Calcium: 10 mg/dL (ref 8.4–10.5)
Chloride: 105 mEq/L (ref 96–112)
Creatinine, Ser: 0.77 mg/dL (ref 0.40–1.20)
GFR: 81.26 mL/min (ref >60.00)
Glucose, Bld: 83 mg/dL (ref 70–99)
Potassium: 4.1 mEq/L (ref 3.5–5.1)
Sodium: 141 mEq/L (ref 135–145)

## 2022-08-05 LAB — HEPATIC FUNCTION PANEL
ALT: 9 U/L (ref 0–35)
AST: 13 U/L (ref 0–37)
Albumin: 3.7 g/dL (ref 3.5–5.2)
Alkaline Phosphatase: 71 U/L (ref 39–117)
Bilirubin, Direct: 0.1 mg/dL (ref 0.0–0.3)
Total Bilirubin: 0.8 mg/dL (ref 0.2–1.2)
Total Protein: 6.9 g/dL (ref 6.0–8.3)

## 2022-08-05 LAB — CBC WITH DIFFERENTIAL/PLATELET
Basophils Absolute: 0 10*3/uL (ref 0.0–0.1)
Basophils Relative: 0.5 % (ref 0.0–3.0)
Eosinophils Absolute: 0.1 10*3/uL (ref 0.0–0.7)
Eosinophils Relative: 0.8 % (ref 0.0–5.0)
HCT: 43.1 % (ref 36.0–46.0)
Hemoglobin: 13.6 g/dL (ref 12.0–15.0)
Lymphocytes Relative: 30 % (ref 12.0–46.0)
Lymphs Abs: 2.2 10*3/uL (ref 0.7–4.0)
MCHC: 31.6 g/dL (ref 30.0–36.0)
MCV: 87.2 fl (ref 78.0–100.0)
Monocytes Absolute: 0.7 10*3/uL (ref 0.1–1.0)
Monocytes Relative: 8.8 % (ref 3.0–12.0)
Neutro Abs: 4.4 10*3/uL (ref 1.4–7.7)
Neutrophils Relative %: 59.9 % (ref 43.0–77.0)
Platelets: 210 10*3/uL (ref 150.0–400.0)
RBC: 4.94 Mil/uL (ref 3.87–5.11)
RDW: 15.2 % (ref 11.5–15.5)
WBC: 7.4 10*3/uL (ref 4.0–10.5)

## 2022-08-05 LAB — URINALYSIS, ROUTINE W REFLEX MICROSCOPIC
Bilirubin Urine: NEGATIVE
Hgb urine dipstick: NEGATIVE
Ketones, ur: NEGATIVE
Leukocytes,Ua: NEGATIVE
Nitrite: NEGATIVE
RBC / HPF: NONE SEEN (ref 0–?)
Specific Gravity, Urine: 1.01 (ref 1.000–1.030)
Total Protein, Urine: NEGATIVE
Urine Glucose: NEGATIVE
Urobilinogen, UA: 0.2 (ref 0.0–1.0)
pH: 7.5 (ref 5.0–8.0)

## 2022-08-05 LAB — TSH: TSH: 1.64 u[IU]/mL (ref 0.35–5.50)

## 2022-08-05 LAB — MICROALBUMIN / CREATININE URINE RATIO
Creatinine,U: 47.3 mg/dL
Microalb Creat Ratio: 1.5 mg/g (ref 0.0–30.0)
Microalb, Ur: <0.7 mg/dL (ref 0.0–1.9)

## 2022-08-05 LAB — LIPID PANEL
Cholesterol: 149 mg/dL (ref 0–200)
HDL: 39.8 mg/dL (ref >39.00)
LDL Cholesterol: 86 mg/dL (ref 0–99)
NonHDL: 109.21
Total CHOL/HDL Ratio: 4
Triglycerides: 114 mg/dL (ref 0.0–149.0)
VLDL: 22.8 mg/dL (ref 0.0–40.0)

## 2022-08-05 LAB — VITAMIN B12: Vitamin B-12: 209 pg/mL — ABNORMAL LOW (ref 211–911)

## 2022-08-05 LAB — HEMOGLOBIN A1C: Hgb A1c MFr Bld: 6.4 % (ref 4.6–6.5)

## 2022-08-05 LAB — VITAMIN D 25 HYDROXY (VIT D DEFICIENCY, FRACTURES): VITD: 25.48 ng/mL — ABNORMAL LOW (ref 30.00–100.00)

## 2022-08-05 MED ORDER — LOSARTAN POTASSIUM 100 MG PO TABS: 100.0000 mg | ORAL_TABLET | Freq: Every day | ORAL | 3 refills | Status: DC

## 2022-08-05 MED ORDER — ATORVASTATIN CALCIUM 80 MG PO TABS: 80.0000 mg | ORAL_TABLET | Freq: Every day | ORAL | 3 refills | Status: DC

## 2022-08-05 MED ORDER — SPIRONOLACTONE 25 MG PO TABS
ORAL_TABLET | ORAL | 3 refills | Status: DC
Start: 2022-08-05 — End: 2023-07-27

## 2022-08-05 MED ORDER — CARVEDILOL 6.25 MG PO TABS
ORAL_TABLET | ORAL | 3 refills | Status: DC
Start: 2022-08-05 — End: 2023-09-26

## 2022-08-05 MED ORDER — PANTOPRAZOLE SODIUM 40 MG PO TBEC: 40.0000 mg | DELAYED_RELEASE_TABLET | Freq: Every day | ORAL | 3 refills | Status: DC

## 2022-08-05 NOTE — Patient Instructions (Signed)
Please take all new medication as prescribed - the protonix 40 mg per day  Please continue all other medications as before, and refills have been done if requested.  Please have the pharmacy call with any other refills you may need.  Please continue your efforts at being more active, low cholesterol diet, and weight control.  You are otherwise up to date with prevention measures today.  Please keep your appointments with your specialists as you may have planned  Please go to the LAB at the blood drawing area for the tests to be done  You will be contacted by phone if any changes need to be made immediately.  Otherwise, you will receive a letter about your results with an explanation, but please check with MyChart first.  Please remember to sign up for MyChart if you have not done so, as this will be important to you in the future with finding out test results, communicating by private email, and scheduling acute appointments online when needed.  Please make an Appointment to return in 6 months, or sooner if needed

## 2022-08-05 NOTE — Progress Notes (Signed)
Patient ID: Mia [name], female   DOB: [birth year], 67 y.o.   MRN: [id number]         Chief Complaint:: wellness exam and Fatigue (Pt states that she has been feeling more tired frequently /Pt has issues with her stomach and when she eats it may her stomach hurts so she limits her eating to 1 meal a day )  , fatigue, constipation, hyperglycemia, gerd       HPI:  Mia [name] is a 67 y.o. 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  Also with recent constipation, nausea, feeling full and early satieity with increased reflux as well.  Denies worsening other abd pain, dysphagia, vomiting,diarrhea, or blood.  Pt denies chest pain, increased sob or doe, wheezing, orthopnea, PND, increased LE swelling, palpitations, dizziness or syncope.   Pt denies polydipsia, polyuria, or new focal neuro s/s.    Pt denies fever, wt loss, night sweats, loss of appetite, or other constitutional symptoms  Does c/o ongoing fatigue, but denies signficant daytime hypersomnolence  Gained several lbs recently  Wt Readings from Last 3 Encounters:  08/05/22 (!) 352 lb (159.7 kg)  04/08/22 (!) 349 lb (158.3 kg)  04/06/22 (!) 349 lb (158.3 kg)   BP Readings from Last 3 Encounters:  08/05/22 130/80  04/08/22 114/81  04/06/22 132/79   Immunization History  Administered Date(s) Administered   PFIZER Comirnaty(Gray Top)Covid-19 Tri-Sucrose Vaccine 04/06/2019, 04/28/2019   Tdap 05/22/2014   Health Maintenance Due  Topic Date Due   PAP SMEAR-Modifier  08/15/2016   INFLUENZA VACCINE  08/05/2022      Past Medical History:  Diagnosis Date   Allergic rhinitis, cause unspecified 04/29/2011   Anemia    Anemia, unspecified 04/23/2011   Back pain    Bilateral swelling of feet and ankles    Chronic anticoagulation    Chronic headaches    Colon polyps 05/2009    Complication of anesthesia    hard to wake up   Constipation    Diverticulosis    DVT (deep venous thrombosis) (HCC)    right upper extremitiy   GERD (gastroesophageal reflux disease) 11/10/2011   Gout 04/22/2010   no current problems per patient on 12/16/21   H/O blood clots    H/O: GI bleed    Hyperlipidemia    Hyperplastic colon polyp    Hypertension    IBS (irritable bowel syndrome)    Infected prosthetic knee joint (HCC)    Joint pain    Migraine 04/29/2011   otc meds prn   Morbid obesity (HCC)    Nonsmoker    Other fatigue    Pre-diabetes    no meds   Pulmonary embolism (HCC) 2010   following right knee replacement   Shortness of breath    with exertion   SOBOE (shortness of breath on exertion)    Vitamin D deficiency    WPW (Wolff-Parkinson-White syndrome)    Past Surgical History:  Procedure Laterality Date   CHOLECYSTECTOMY     open surgery with abd incision   COLONOSCOPY     x 7   DIAGNOSTIC LAPAROSCOPY Left    knee   DIAGNOSTIC LAPAROSCOPY Right    right wrist surgery for cyst   KNEE ARTHROSCOPY Left  MULTIPLE TOOTH EXTRACTIONS     all upper teeth extracted - upper partial   RADIOLOGY WITH ANESTHESIA N/A 04/08/2022   Procedure: CT ABDOMEN WITH AND WITHOUT CONTRAST;  Surgeon: Radiologist, Medication, MD;  Location: [facility] OR;  Service: Radiology;  Laterality: N/A;   TOTAL KNEE ARTHROPLASTY Right 01/05/2008   followed by I & D for infection  x 2   TUBAL LIGATION     WISDOM TOOTH EXTRACTION      reports that she has never smoked. She has never used smokeless tobacco. She reports that she does not drink alcohol and does not use drugs. family history includes Colon cancer in an other family member; Coronary artery disease in an other family member; Diabetes in her father; Heart Problems in her mother; Heart disease in her mother and another family member; Hypertension in her brother, sister, sister, and sister; Pancreatic cancer in her sister; Throat cancer in  her father. Allergies  Allergen Reactions   Crestor [Rosuvastatin Calcium] Other (See Comments)    Chest pain   Amlodipine Other (See Comments)    Edema, headache    Ultram [Tramadol] Nausea Only   Zosyn [Piperacillin Sod-Tazobactam So] Other (See Comments)    REACTION: "UNSURE"   Current Outpatient Medications on File Prior to Visit  Medication Sig Dispense Refill   acetaminophen (TYLENOL) 650 MG CR tablet Take 1,300 mg by mouth at bedtime.     bisacodyl (DULCOLAX) 5 MG EC tablet Take 15 mg by mouth daily as needed for moderate constipation.     Cholecalciferol (VITAMIN D3) 50 MCG (2000 UT) CAPS Take 4,000 Units by mouth daily.     meclizine (ANTIVERT) 12.5 MG tablet Take 1 tablet (12.5 mg total) by mouth 3 (three) times daily as needed for dizziness. 40 tablet 1   polyethylene glycol powder (GLYCOLAX/MIRALAX) 17 GM/SCOOP powder Take twice daily ( 17gr) until stooling regular, then once daily 3350 g 1   rivaroxaban (XARELTO) 20 MG TABS tablet TAKE 1 TABLET(20 MG) BY MOUTH DAILY WITH SUPPER 90 tablet 0   No current facility-administered medications on file prior to visit.        ROS:  All others reviewed and negative.  Objective        PE:  BP 130/80 (BP Location: Left Arm, Patient Position: Sitting, Cuff Size: Normal)   Pulse 71   Temp 97.9 F (36.6 C) (Oral)   Ht 5\' 3"  (1.6 m)   Wt (!) 352 lb (159.7 kg)   LMP 04/10/2010   SpO2 98%   BMI 62.35 kg/m                 Constitutional: Pt appears in NAD               HENT: Head: NCAT.                Right Ear: External ear normal.                 Left Ear: External ear normal.                Eyes: . Pupils are equal, round, and reactive to light. Conjunctivae and EOM are normal               Nose: without d/c or deformity               Neck: Neck supple. Gross normal ROM               Cardiovascular:  Normal rate and regular rhythm.                 Pulmonary/Chest: Effort normal and breath sounds without rales or wheezing.                 Abd:  Soft, NT, ND, + BS, no organomegaly               Neurological: Pt is alert. At baseline orientation, motor grossly intact               Skin: Skin is warm. No rashes, no other new lesions, LE edema - none               Psychiatric: Pt behavior is normal without agitation   Micro: none  Cardiac tracings I have personally interpreted today:  none  Pertinent Radiological findings (summarize): none   Lab Results  Component Value Date   WBC 7.4 08/05/2022   HGB 13.6 08/05/2022   HCT 43.1 08/05/2022   PLT 210.0 08/05/2022   GLUCOSE 83 08/05/2022   CHOL 149 08/05/2022   TRIG 114.0 08/05/2022   HDL 39.80 08/05/2022   LDLDIRECT 148.9 12/11/2012   LDLCALC 86 08/05/2022   ALT 9 08/05/2022   AST 13 08/05/2022   NA 141 08/05/2022   K 4.1 08/05/2022   CL 105 08/05/2022   CREATININE 0.77 08/05/2022   BUN 13 08/05/2022   CO2 31 08/05/2022   TSH 1.64 08/05/2022   INR 2.8 03/28/2018   HGBA1C 6.4 08/05/2022   MICROALBUR <0.7 08/05/2022   Assessment/Plan:  Mia [name] is a 67 y.o. Black or African American [2] female with  has a past medical history of Allergic rhinitis, cause unspecified (04/29/2011), Anemia, Anemia, unspecified (04/23/2011), Back pain, Bilateral swelling of feet and ankles, Chronic anticoagulation, Chronic headaches, Colon polyps (05/2009), Complication of anesthesia, Constipation, Diverticulosis, DVT (deep venous thrombosis) (HCC), GERD (gastroesophageal reflux disease) (11/10/2011), Gout (04/22/2010), H/O blood clots, H/O: GI bleed, Hyperlipidemia, Hyperplastic colon polyp, Hypertension, IBS (irritable bowel syndrome), Infected prosthetic knee joint (HCC), Joint pain, Migraine (04/29/2011), Morbid obesity (HCC), Nonsmoker, Other fatigue, Pre-diabetes, Pulmonary embolism (HCC) (2010), Shortness of breath, SOBOE (shortness of breath on exertion), Vitamin D deficiency, and WPW (Wolff-Parkinson-White syndrome).  Encounter for well adult exam with abnormal  findings Age and sex appropriate education and counseling updated with regular exercise and diet Referrals for preventative services - declines colonoscopy Immunizations addressed - declines shingirx Smoking counseling  - none needed Evidence for depression or other mood disorder - none significant Most recent labs reviewed. I have personally reviewed and have noted: 1) the patient's medical and social history 2) The patient's current medications and supplements 3) The patient's [name], weight, and BMI have been recorded in the chart   Dyslipidemia Lab Results  Component Value Date   LDLCALC 86 08/05/2022   Uncontrolled, goal ldl < 70 , pt to continue current statin lipitor 80 every day, declines add zetia for now   Atherosclerosis of aorta (HCC) Pt to continue lower chol diet, lipitor 80 mg every day, excercise  Hyperglycemia Lab Results  Component Value Date   HGBA1C 6.4 08/05/2022   Stable, pt to continue current medical treatment  - diet, wt control   Vitamin D deficiency Last vitamin D Lab Results  Component Value Date   VD25OH 25.48 (L) 08/05/2022   Low, to start oral replacement   Constipation With recent mild worsening, to restart miralax 17 gm qd  GERD (gastroesophageal reflux disease) With  recent mild worsening, for start protonix 40 qd  B12 deficiency Lab Results  Component Value Date   VITAMINB12 209 (L) 08/05/2022   Low, to start oral replacement - b12 1000 mcg qd   Followup: Return in about 6 months (around 02/05/2023).  Oliver Barre, MD 08/08/2022 10:52 AM [facility]

## 2022-08-08 ENCOUNTER — Encounter: Payer: Self-pay | Admitting: Internal Medicine

## 2022-08-08 DIAGNOSIS — I700 Atherosclerosis of aorta: Secondary | ICD-10-CM | POA: Insufficient documentation

## 2022-08-08 DIAGNOSIS — E538 Deficiency of other specified B group vitamins: Secondary | ICD-10-CM | POA: Insufficient documentation

## 2022-08-08 NOTE — Assessment & Plan Note (Signed)
Pt to continue lower chol diet, lipitor 80 mg every day, excercise

## 2022-08-08 NOTE — Assessment & Plan Note (Signed)
Lab Results  Component Value Date   LDLCALC 86 08/05/2022   Uncontrolled, goal ldl < 70 , pt to continue current statin lipitor 80 every day, declines add zetia for now

## 2022-08-08 NOTE — Assessment & Plan Note (Signed)
Lab Results  Component Value Date   HGBA1C 6.4 08/05/2022   Stable, pt to continue current medical treatment  - diet, wt control

## 2022-08-08 NOTE — Assessment & Plan Note (Signed)
With recent mild worsening, to restart miralax 17 gm qd

## 2022-08-08 NOTE — Assessment & Plan Note (Signed)
Last vitamin D Lab Results  Component Value Date   VD25OH 25.48 (L) 08/05/2022   Low, to start oral replacement

## 2022-08-08 NOTE — Assessment & Plan Note (Signed)
With recent mild worsening, for start protonix 40 qd

## 2022-08-08 NOTE — Assessment & Plan Note (Addendum)
Age and sex appropriate education and counseling updated with regular exercise and diet Referrals for preventative services - declines colonoscopy Immunizations addressed - declines shingirx Smoking counseling  - none needed Evidence for depression or other mood disorder - none significant Most recent labs reviewed. I have personally reviewed and have noted: 1) the patient's medical and social history 2) The patient's current medications and supplements 3) The patient's height, weight, and BMI have been recorded in the chart

## 2022-08-08 NOTE — Assessment & Plan Note (Signed)
Lab Results  Component Value Date   VITAMINB12 209 (L) 08/05/2022   Low, to start oral replacement - b12 1000 mcg qd

## 2022-08-26 ENCOUNTER — Telehealth: Payer: Self-pay | Admitting: Cardiology

## 2022-08-26 NOTE — Telephone Encounter (Signed)
Call transferred for right arm, shoulder and neck pain.  No tingling or numbness.  She told Dr. Jonny Ruiz about the symptoms on Aug 1, since then it has worsened.  Sunday night she couldn't get comfortable in bed due to chest discomfort.  Felt like someone sitting on her chest. Dizziness today every time she stands up.    Usually tylenol and pain patches help, but not today.   Neck has started popping a lot, like a cracking.  Raising arm makes it worse, washing hair for instance. BP 116/75, 91/61 HR 59-79 - no rapid or pounding heart rate. H/O WPW   She's not had much to drink and BP little on low side which may be causing the dizzy feeling.  She will increase fluids and may try a small salty snack.  Adv w tylenol helping relieve discomfort, and washing her hair makes the pain in her arm worse, seems less likely cardiac.  No known coronary disease.  ER precautions given.  Will keep appt with APP scheduled next week.

## 2022-08-26 NOTE — Telephone Encounter (Signed)
   Pt c/o of Chest Pain: STAT if active CP, including tightness, pressure, jaw pain, radiating pain to shoulder/upper arm/back, CP unrelieved by Nitro. Symptoms reported of SOB, nausea, vomiting, sweating.  1. Are you having CP right now? Chest discomfort on Sunday Pain in her right arm going up to her neck -still having it   2. Are you experiencing any other symptoms (ex. SOB, nausea, vomiting, sweating)? Dizziness- worse today   3. Is your CP continuous or coming and going? Coming and going, but it is lingering  4. Have you taken Nitroglycerin? no   5. How long have you been experiencing CP? -last Sunday - patient called and wanted to be seen- first available to  is on Wednesday at with Robet Leu  6. If NO CP at time of call then end call with telling Pt to call back or call 911 if Chest pain returns prior to return call from triage team.

## 2022-09-01 ENCOUNTER — Ambulatory Visit: Payer: BC Managed Care – PPO | Admitting: Cardiology

## 2022-09-22 ENCOUNTER — Ambulatory Visit (HOSPITAL_COMMUNITY)
Admission: EM | Admit: 2022-09-22 | Discharge: 2022-09-22 | Discharge status: Against Medical Advice | Payer: BC Managed Care – PPO

## 2022-09-23 ENCOUNTER — Ambulatory Visit (INDEPENDENT_AMBULATORY_CARE_PROVIDER_SITE_OTHER): Payer: BC Managed Care – PPO

## 2022-09-23 ENCOUNTER — Encounter (HOSPITAL_COMMUNITY): Payer: Self-pay

## 2022-09-23 ENCOUNTER — Ambulatory Visit (HOSPITAL_COMMUNITY)
Admission: RE | Admit: 2022-09-23 | Discharge: 2022-09-23 | Disposition: A | Discharge status: Home / Self Care | Payer: BC Managed Care – PPO | Source: Ambulatory Visit | Attending: Internal Medicine | Admitting: Internal Medicine

## 2022-09-23 ENCOUNTER — Ambulatory Visit (HOSPITAL_COMMUNITY): Payer: BC Managed Care – PPO

## 2022-09-23 VITALS — BP 136/80 | HR 96 | Temp 98.3°F | Resp 20

## 2022-09-23 DIAGNOSIS — M25551 Pain in right hip: Secondary | ICD-10-CM | POA: Diagnosis not present

## 2022-09-23 DIAGNOSIS — R58 Hemorrhage, not elsewhere classified: Secondary | ICD-10-CM

## 2022-09-23 NOTE — Discharge Instructions (Addendum)
There is no fracture present on the x-ray.  This bruise will change colors and likely take a couple weeks to improve.  In the meantime, avoid ice, use heat.  You may take up to 1000 mg Tylenol every 6 hours as needed.  You may also try lidocaine patch for any discomfort you are feeling.

## 2022-09-23 NOTE — ED Triage Notes (Signed)
Pt fell Tuesday when her artificial knee locked up on her./ pt fell onto right side. Pt reports had to call EMS because was unable to get herself up off floor. C/o right hip pain. Tried ice initially and using heat since. Pt denies hitting head or LOC. Pt takes Xeralto.
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
## 2022-09-30 ENCOUNTER — Other Ambulatory Visit: Payer: Self-pay | Admitting: Internal Medicine

## 2022-09-30 DIAGNOSIS — Z1231 Encounter for screening mammogram for malignant neoplasm of breast: Secondary | ICD-10-CM

## 2022-10-14 ENCOUNTER — Other Ambulatory Visit: Payer: Self-pay | Admitting: Cardiology

## 2022-10-14 DIAGNOSIS — Z86718 Personal history of other venous thrombosis and embolism: Secondary | ICD-10-CM

## 2022-10-14 NOTE — Telephone Encounter (Signed)
Xarelto 20mg  refill request received. Pt is 67 years old, weight-159.7kg, Crea-0.77 on 08/05/22, last seen by Dr. Anne Fu on 04/06/22, Diagnosis-DVT & PE, CrCl-183.64 mL/min; Dose is appropriate based on dosing criteria. Will send in refill to requested pharmacy.

## 2022-11-01 ENCOUNTER — Ambulatory Visit
Admission: RE | Admit: 2022-11-01 | Discharge: 2022-11-01 | Disposition: A | Discharge status: Home / Self Care | Payer: BC Managed Care – PPO | Source: Ambulatory Visit | Attending: Internal Medicine | Admitting: Internal Medicine

## 2022-11-01 DIAGNOSIS — Z1231 Encounter for screening mammogram for malignant neoplasm of breast: Secondary | ICD-10-CM

## 2022-12-20 ENCOUNTER — Encounter (HOSPITAL_COMMUNITY): Payer: Self-pay

## 2022-12-20 ENCOUNTER — Ambulatory Visit (HOSPITAL_COMMUNITY)
Admission: EM | Admit: 2022-12-20 | Discharge: 2022-12-20 | Disposition: A | Discharge status: Home / Self Care | Payer: Medicare PPO | Attending: Family Medicine | Admitting: Family Medicine

## 2022-12-20 DIAGNOSIS — M79652 Pain in left thigh: Secondary | ICD-10-CM

## 2022-12-20 DIAGNOSIS — M79605 Pain in left leg: Secondary | ICD-10-CM

## 2022-12-20 DIAGNOSIS — I10 Essential (primary) hypertension: Secondary | ICD-10-CM

## 2022-12-20 MED ORDER — HYDROCODONE-ACETAMINOPHEN 5-325 MG PO TABS
1.0000 | ORAL_TABLET | Freq: Four times a day (QID) | ORAL | 0 refills | Status: DC | PRN
Start: 2022-12-20 — End: 2023-08-09

## 2022-12-20 MED ORDER — PREDNISONE 10 MG (21) PO TBPK: ORAL_TABLET | Freq: Every day | ORAL | 0 refills | Status: AC

## 2022-12-20 NOTE — ED Triage Notes (Signed)
Pt c/o constant lt leg pain radiating down leg x6 days. Denies injury. States taking muscle relaxants and tylenol with no relief.

## 2022-12-20 NOTE — Discharge Instructions (Addendum)
Be aware, you have been prescribed pain medications that may cause drowsiness. While taking this medication, do not take any other medications containing acetaminophen (Tylenol). Do not combine with alcohol or recreational drugs. Please do not drive, operate heavy machinery, or take part in activities that require making important decisions while on this medication as your judgement may be clouded.  Your blood pressure was noted to be elevated during your visit today. If you are currently taking medication for high blood pressure, please ensure you are taking this as directed. If you do not have a history of high blood pressure and your blood pressure remains persistently elevated, you may need to begin taking a medication at some point. You may return here within the next few days to recheck if unable to see your primary care provider or if you do not have a one.  BP (!) 203/111 (BP Location: Left Arm)   Pulse 73   Temp 98.2 F (36.8 C) (Oral)   Resp 18   LMP 04/10/2010   SpO2 96%   BP Readings from Last 3 Encounters:  12/20/22 (!) 203/111  09/23/22 136/80  08/05/22 130/80

## 2022-12-22 NOTE — ED Provider Notes (Signed)
[facility]   [id number] 12/20/22 Arrival Time: 1101  ASSESSMENT & PLAN:  1. Acute leg pain, left   2. Acute pain of left thigh   3. Elevated blood pressure reading with diagnosis of hypertension    Ques groin strain vs nerve pain of LLE. Unsure. Denies injury. With normal bowel/bladder habits.  Discharge Medication List as of 12/20/2022  1:40 PM     START taking these medications   Details  HYDROcodone-acetaminophen (NORCO/VICODIN) 5-325 MG tablet Take 1 tablet by mouth every 6 (six) hours as needed for moderate pain (pain score 4-6) or severe pain (pain score 7-10)., Starting Mon 12/20/2022, Normal    predniSONE (STERAPRED UNI-PAK 21 TAB) 10 MG (21) TBPK tablet Take by mouth daily. Take as directed., Starting Mon 12/20/2022, Normal       Denies ED eval for BP.    Discharge Instructions      Be aware, you have been prescribed pain medications that may cause drowsiness. While taking this medication, do not take any other medications containing acetaminophen (Tylenol). Do not combine with alcohol or recreational drugs. Please do not drive, operate heavy machinery, or take part in activities that require making important decisions while on this medication as your judgement may be clouded.  Your blood pressure was noted to be elevated during your visit today. If you are currently taking medication for high blood pressure, please ensure you are taking this as directed. If you do not have a history of high blood pressure and your blood pressure remains persistently elevated, you may need to begin taking a medication at some point. You may return here within the next few days to recheck if unable to see your primary care provider or if you do not have a one.  BP (!) 203/111 (BP Location: Left Arm)   Pulse 73   Temp 98.2 F (36.8 C) (Oral)   Resp 18   LMP 04/10/2010   SpO2 96%   BP Readings from Last 3 Encounters:  12/20/22 (!) 203/111  09/23/22 136/80  08/05/22  130/80      Recommend:  Follow-up Information     [facility]   Specialty: Specialist Why: If worsening or failing to improve as anticipated. Contact information: [address] [place] [address] [phone]         Corwin Levins, MD.   Specialties: Internal Medicine, Radiology Why: To recheck your blood pressure. Contact information: [address] [place] [address] [phone]                [place] Controlled Substances Registry consulted for this patient. I feel the risk/benefit ratio today is favorable for proceeding with this prescription for a controlled substance. Medication sedation precautions given.  Reviewed expectations re: course of current medical issues. Questions answered. Outlined signs and symptoms indicating need for more acute intervention. Patient verbalized understanding. After Visit Summary given.  SUBJECTIVE: History from: patient. Mia [name] is a 67 y.o. female who reports fairly constant LLE pain. Mainly LL inner thigh. Noted approx 6 d ago; steady since. Certain movements do exacerbate. Trouble sleeping secondary to pain.Tylenol without relief. Denies changes in bowel/bladder habit changes. Is ambulatory. Denies back/buttock pain. No extremity sensation changes or weakness.   Past Surgical History:  Procedure Laterality Date   CHOLECYSTECTOMY     open surgery with abd incision   COLONOSCOPY     x 7   DIAGNOSTIC LAPAROSCOPY Left    knee   DIAGNOSTIC LAPAROSCOPY  Right    right wrist surgery for cyst   KNEE ARTHROSCOPY Left    MULTIPLE TOOTH EXTRACTIONS     all upper teeth extracted - upper partial   RADIOLOGY WITH ANESTHESIA N/A 04/08/2022   Procedure: CT ABDOMEN WITH AND WITHOUT CONTRAST;  Surgeon: Radiologist, Medication, MD;  Location: [facility] OR;  Service: Radiology;  Laterality: N/A;   TOTAL KNEE ARTHROPLASTY Right 01/05/2008   followed by I & D for infection  x 2   TUBAL LIGATION     WISDOM TOOTH  EXTRACTION      Increased blood pressure noted today. Reports that she is treated for HTN. No symptoms.   OBJECTIVE:  Vitals:   12/20/22 1253  BP: (!) 203/111  Pulse: 73  Resp: 18  Temp: 98.2 F (36.8 C)  TempSrc: Oral  SpO2: 96%    General appearance: alert; no distress HEENT: [place]; AT Neck: supple with FROM Resp: unlabored respirations Extremities: LLE: warm with well perfused appearance; (body habitus does limit exam) poorly localized moderate tenderness over left inner thigh over musculature; without appreciable masses; without gross deformities; swelling: none; bruising: none; hip and knee ROM: normal CV: brisk extremity capillary refill of LLE; 2+ DP pulse of LLE. Skin: warm and dry; no visible rashes Neurologic: gait normal; normal sensation and strength of LLE Psychological: alert and cooperative; normal mood and affect   Allergies  Allergen Reactions   Crestor [Rosuvastatin Calcium] Other (See Comments)    Chest pain   Amlodipine Other (See Comments)    Edema, headache    Ultram [Tramadol] Nausea Only   Zosyn [Piperacillin Sod-Tazobactam So] Other (See Comments)    REACTION: "UNSURE"    Past Medical History:  Diagnosis Date   Allergic rhinitis, cause unspecified 04/29/2011   Anemia    Anemia, unspecified 04/23/2011   Back pain    Bilateral swelling of feet and ankles    Chronic anticoagulation    Chronic headaches    Colon polyps 05/2009   Complication of anesthesia    hard to wake up   Constipation    Diverticulosis    DVT (deep venous thrombosis) (HCC)    right upper extremitiy   GERD (gastroesophageal reflux disease) 11/10/2011   Gout 04/22/2010   no current problems per patient on 12/16/21   H/O blood clots    H/O: GI bleed    Hyperlipidemia    Hyperplastic colon polyp    Hypertension    IBS (irritable bowel syndrome)    Infected prosthetic knee joint (HCC)    Joint pain    Migraine 04/29/2011   otc meds prn   Morbid obesity (HCC)     Nonsmoker    Other fatigue    Pre-diabetes    no meds   Pulmonary embolism (HCC) 2010   following right knee replacement   Shortness of breath    with exertion   SOBOE (shortness of breath on exertion)    Vitamin D deficiency    WPW (Wolff-Parkinson-White syndrome)    Social History   Socioeconomic History   Marital status: Media planner    Spouse name: Conservation officer, nature   Number of children: 2   Years of education: Not on file   Highest education level: Not on file  Occupational History   Occupation: TEACHER ASST    Employer: [employer]  Tobacco Use   Smoking status: Never   Smokeless tobacco: Never  Vaping Use   Vaping status: Never Used  Substance and Sexual Activity  Alcohol use: No   Drug use: No   Sexual activity: Not Currently    Partners: Male    Birth control/protection: Post-menopausal  Other Topics Concern   Not on file  Social History Narrative   Not on file   Social Drivers of Health   Financial Resource Strain: Not on file  Food Insecurity: Not on file  Transportation Needs: Not on file  Physical Activity: Not on file  Stress: Not on file  Social Connections: Not on file   Family History  Problem Relation Age of Onset   Heart disease Mother    Heart Problems Mother    Diabetes Father    Throat cancer Father    Pancreatic cancer Sister    Hypertension Sister    Hypertension Sister    Hypertension Sister    Hypertension Brother    Colon cancer Other    Coronary artery disease Other    Heart disease Other    Past Surgical History:  Procedure Laterality Date   CHOLECYSTECTOMY     open surgery with abd incision   COLONOSCOPY     x 7   DIAGNOSTIC LAPAROSCOPY Left    knee   DIAGNOSTIC LAPAROSCOPY Right    right wrist surgery for cyst   KNEE ARTHROSCOPY Left    MULTIPLE TOOTH EXTRACTIONS     all upper teeth extracted - upper partial   RADIOLOGY WITH ANESTHESIA N/A 04/08/2022   Procedure: CT ABDOMEN WITH AND WITHOUT CONTRAST;  Surgeon:  Radiologist, Medication, MD;  Location: [facility] OR;  Service: Radiology;  Laterality: N/A;   TOTAL KNEE ARTHROPLASTY Right 01/05/2008   followed by I & D for infection  x 2   TUBAL LIGATION     WISDOM TOOTH EXTRACTION         Mardella Layman, MD 12/22/22 1131

## 2022-12-23 ENCOUNTER — Ambulatory Visit: Payer: Self-pay | Admitting: Internal Medicine

## 2022-12-23 NOTE — Telephone Encounter (Signed)
Copied from CRM [id number]. Topic: Clinical - Red Word Triage >> Dec 23, 2022  9:26 AM Leavy Cella D wrote: Red Word that prompted transfer to Nurse Triage: SEVERE PAIN IN LEFT LEG AND FOUND LUMP ON HER ARM   Chief Complaint: Leg pain, lump in arm Symptoms: Severe leg pain, pain to knot in arm, nocturnal leg cramping Frequency: Continual leg pain that worsens with movement, intermittent pain to knot in arm Pertinent Negatives: Patient denies chest pain, SOB, redness or swelling to area, numbness or tingling,  Disposition: [] ED /[x] Urgent Care (no appt availability in office) / [x] Appointment(In office/virtual)/ []  [facility]/ [] Home Care/ [] Refused Recommended Disposition /[] [facility]/ []  Follow-up with PCP Additional Notes: Pt reporting today is 10th day of pain, "at first felt like had pulled muscle on inside of thigh" but pt reporting no injury, pt reporting pain "when get up, thigh up to crotch, it pops." Pt confirms pain in left leg, reporting hx of knee replacements on right leg. Pt also reporting was taking shower and "felt a little knot today in arm today" for first time, confirms new lump. Pt reporting pain "on inside of thigh to crease where leg starts." Pt also reporting pain in arm at knot "only when touch it, rubbing it." Pt reporting leg pain is "always between 15 and 20" out of 10. Pt reporting pain is "severe," but confirms "just at a 10 right now because sitting, just took a hot shower." Pt is speaking in clear, coherent, full sentences, denies SOB or chest pain, not breathing through pain. Pt reporting "even getting in and out of car, struggling to get leg in and pain is so severe when try to walk, takes a while to stand up and get balance to start walking, don't stop hurting, constant 24 hour pain, never had this before, been this pain for 10 days now, thought would go away." Pt confirms area of pain goes down to knee but "knee not hurting, if standing for while knee  will start a little bit, just thigh hurts." Pt also reporting "really bad leg cramps at night when sleeping, trying to turn over at night have pop in thigh, not been sleeping really well, will wake me up, will take shower in really hot water at 3 am." Pt confirms she does not usually have to lift leg up to move it, "usually just step on in there" in bath but having to move her leg that way with her arms. Pt denies injury to area. Pt denies chest pain or SOB. Pt reporting no redness or swelling to area, no fever. Pt reporting she's been taking magnesium for 2 days. Pt reporting pain in arm "only when touch it, rubbing it." Pt reporting she is "prone to blood clots, been on xarelto for years, was on warfarin after knee replacement." Pt confirms no chest pain or SOB, goes to heart doc regularly, pt reporting hx of WPW (congenital, rapid heartbeat). Pt confirms pain is "same" as it was in ED. Advised pt be examined today, no availability with PCP office until tomorrow morning, confirmed with CAL that okay to schedule 20 min appt for leg pain and lump in arm, doc will change schedule as appropriate per front desk - pt also needing follow up for BP per ED note in chart for 12/16. Per ED note in chart, advised pt be seen with [facility] today for leg pain, gave pt the contact info, pt confirmed she will call them straight away for  appt. Pt verbalized understanding to call if any worsening or go to ED if new chest pain, SOB, or worsening pain.  Reason for Disposition  History of prior "blood clot" in leg or lungs (i.e., deep vein thrombosis, pulmonary embolism)  Answer Assessment - Initial Assessment Questions 1. ONSET: "When did the pain start?"      Pt reporting today is 10th day of pain, "at first felt like had pulled muscle on inside of thigh" but pt reporting no injury, pt reporting pain "when get up, thigh up to crotch, it pops." Pt confirms pain in left leg, reporting hx of knee replacements on right leg. Pt  also reporting was taking shower and "felt a little knot today in arm today" for first time, confirms new lump.  2. LOCATION: "Where is the pain located?"      Pt reporting pain "on inside of thigh to crease where leg starts." Pt also reporting pain in arm at knot "only when touch it, rubbing it."  3. PAIN: "How bad is the pain?"    (Scale 1-10; or mild, moderate, severe)   -  MILD (1-3): doesn't interfere with normal activities    -  MODERATE (4-7): interferes with normal activities (e.g., work or school) or awakens from sleep, limping    -  SEVERE (8-10): excruciating pain, unable to do any normal activities, unable to walk     Pt reporting pain is "always between 15 and 20" out of 10. Pt reporting pain is "severe," but confirms "just at a 10 right now because sitting, just took a hot shower." Pt is speaking in clear, coherent, full sentences, denies SOB or chest pain, not breathing through pain. Pt reporting "even getting in and out of car, struggling to get leg in and pain is so severe when try to walk, takes a while to stand up and get balance to start walking, don't stop hurting, constant 24 hour pain, never had this before, been this pain for 10 days now, thought would go away, knee not hurting but goes down to knee, if standing for while knee will start a little bit, just thigh hurts." Pt also reporting "really bad leg cramps at night when sleeping, trying to turn over at night have pop in thigh, not been sleeping really well, will wake me up, will take shower in really hot water at 3 am." Pt confirms she does not usually have to lift leg up to move it, "usually just step on in there" in bath but having to move her leg that way with her arms. 4. WORK OR EXERCISE: "Has there been any recent work or exercise that involved this part of the body?"      Pt denies injury to area 6. OTHER SYMPTOMS: "Do you have any other symptoms?" (e.g., chest pain, back pain, breathing difficulty, swelling, rash, fever,  numbness, weakness)     Pt denies chest pain or SOB. Pt reporting no redness or swelling to area, no fever. Pt reporting she's been taking magnesium for 2 days. Pt reporting pain in arm "only when touch it, rubbing it." Pt reporting she is "prone to blood clots, been on xarelto for years, was on warfarin after knee replacement." Pt confirms no chest pain or SOB, goes to heart doc regularly, pt reporting hx of WPW (congenital, rapid heartbeat).  Protocols used: Leg Pain-A-AH

## 2022-12-24 ENCOUNTER — Ambulatory Visit (INDEPENDENT_AMBULATORY_CARE_PROVIDER_SITE_OTHER): Payer: Medicare PPO | Admitting: Family Medicine

## 2022-12-24 ENCOUNTER — Encounter: Payer: Self-pay | Admitting: Family Medicine

## 2022-12-24 VITALS — BP 130/86 | HR 66 | Temp 97.6°F | Ht 63.0 in | Wt 336.8 lb

## 2022-12-24 DIAGNOSIS — Z86718 Personal history of other venous thrombosis and embolism: Secondary | ICD-10-CM

## 2022-12-24 DIAGNOSIS — I10 Essential (primary) hypertension: Secondary | ICD-10-CM

## 2022-12-24 DIAGNOSIS — R233 Spontaneous ecchymoses: Secondary | ICD-10-CM

## 2022-12-24 DIAGNOSIS — M79605 Pain in left leg: Secondary | ICD-10-CM

## 2022-12-24 DIAGNOSIS — T148XXA Other injury of unspecified body region, initial encounter: Secondary | ICD-10-CM

## 2022-12-24 DIAGNOSIS — Z7901 Long term (current) use of anticoagulants: Secondary | ICD-10-CM | POA: Diagnosis not present

## 2022-12-24 DIAGNOSIS — M79602 Pain in left arm: Secondary | ICD-10-CM | POA: Diagnosis not present

## 2022-12-24 DIAGNOSIS — L723 Sebaceous cyst: Secondary | ICD-10-CM

## 2022-12-24 DIAGNOSIS — H60391 Other infective otitis externa, right ear: Secondary | ICD-10-CM | POA: Diagnosis not present

## 2022-12-24 LAB — CBC WITH DIFFERENTIAL/PLATELET
Basophils Absolute: 0.1 10*3/uL (ref 0.0–0.1)
Basophils Relative: 0.8 % (ref 0.0–3.0)
Eosinophils Absolute: 0 10*3/uL (ref 0.0–0.7)
Eosinophils Relative: 0 % (ref 0.0–5.0)
HCT: 44.8 % (ref 36.0–46.0)
Hemoglobin: 14.3 g/dL (ref 12.0–15.0)
Lymphocytes Relative: 13.4 % (ref 12.0–46.0)
Lymphs Abs: 1.5 10*3/uL (ref 0.7–4.0)
MCHC: 32 g/dL (ref 30.0–36.0)
MCV: 87.8 fL (ref 78.0–100.0)
Monocytes Absolute: 0.7 10*3/uL (ref 0.1–1.0)
Monocytes Relative: 6.6 % (ref 3.0–12.0)
Neutro Abs: 8.8 10*3/uL — ABNORMAL HIGH (ref 1.4–7.7)
Neutrophils Relative %: 79.2 % — ABNORMAL HIGH (ref 43.0–77.0)
Platelets: 253 10*3/uL (ref 150.0–400.0)
RBC: 5.1 Mil/uL (ref 3.87–5.11)
RDW: 14.6 % (ref 11.5–15.5)
WBC: 11.1 10*3/uL — ABNORMAL HIGH (ref 4.0–10.5)

## 2022-12-24 LAB — COMPREHENSIVE METABOLIC PANEL
ALT: 14 U/L (ref 0–35)
AST: 17 U/L (ref 0–37)
Albumin: 4.1 g/dL (ref 3.5–5.2)
Alkaline Phosphatase: 92 U/L (ref 39–117)
BUN: 26 mg/dL — ABNORMAL HIGH (ref 6–23)
CO2: 28 meq/L (ref 19–32)
Calcium: 10.8 mg/dL — ABNORMAL HIGH (ref 8.4–10.5)
Chloride: 103 meq/L (ref 96–112)
Creatinine, Ser: 0.9 mg/dL (ref 0.40–1.20)
GFR: 67.21 mL/min (ref >60.00)
Glucose, Bld: 115 mg/dL — ABNORMAL HIGH (ref 70–99)
Potassium: 4.8 meq/L (ref 3.5–5.1)
Sodium: 138 meq/L (ref 135–145)
Total Bilirubin: 0.8 mg/dL (ref 0.2–1.2)
Total Protein: 7.7 g/dL (ref 6.0–8.3)

## 2022-12-24 MED ORDER — CHLORHEXIDINE GLUCONATE 4 % EX SOLN: Freq: Every day | CUTANEOUS | 1 refills | Status: AC | PRN

## 2022-12-24 MED ORDER — HYDROCORTISONE-ACETIC ACID 1-2 % OT SOLN
3.0000 [drp] | Freq: Two times a day (BID) | OTIC | 0 refills | Status: AC
Start: 2022-12-24 — End: ?

## 2022-12-24 NOTE — Patient Instructions (Addendum)
We are checking labs today, will be in contact with any results that require further attention.  I have sent in Hibiclens for you to use in your arms to help prevent infection of the cyst that is present, and help to prevent further infection.  I have sent in eardrops for you to use 3 drops to the affected ear twice a day for the next 7 days.  Continue with current medication regimen.   Follow-up with me for new or worsening symptoms.

## 2022-12-24 NOTE — Progress Notes (Signed)
Acute Office Visit  Subjective:     Patient ID: Mia [name], female    DOB: 11-05-1955, 67 y.o.   MRN: [id number]  Chief Complaint  Patient presents with   Hypertension    BP 211/111 during last urgent care visit   Leg Pain    PT noted of leg pain to the touch. Was informed that she would need an ultrasound to see if there was a blood clot present but was not able to be provided ultrasound. PT does have past history of blood clots post one of their knee surgeries. Currently taking xarelto. Prescribed prednisone and hydrocodone.   Arm Pain    Noticeable knots on left arm. Knot on outer bicep for PT shows discolored with a ring around it of 7 inches. Noted it is similar to a knot that has been present under left arm for the past 3 years    HPI Patient is in today for evaluation of left thigh pain, as well as left upper arm pain with bruising.  She is currently taking Xarelto 20 mg once daily.  She was seen in the ER on 12/20/2022 for the same and was sent home with prednisone and hydrocodone.  States this is not doing her any good. Blood pressure was extremely elevated in the ER, notes reviewed by me. Patient attributed this to pain. Denies any erythema, swelling, exquisite tenderness, to the areas of soreness. Also reports that she has a lump under her left arm that is beginning to be sore.  No discharge, bleeding, chills, fever, other symptoms. Medical history as outlined below.  ROS Per HPI      Objective:    BP 130/86 (Patient Position: Sitting)   Pulse 66   Temp 97.6 F (36.4 C) (Oral)   Ht 5\' 3"  (1.6 m)   Wt (!) 336 lb 12.8 oz (152.8 kg)   LMP 04/10/2010   SpO2 95%   BMI 59.66 kg/m    Physical Exam Vitals and nursing note reviewed.  Constitutional:      General: She is not in acute distress.    Appearance: Normal appearance. She is obese.  HENT:     Head: Normocephalic and atraumatic.     Right Ear: Tympanic membrane normal. Drainage (clear/yellow),  swelling and tenderness present.     Left Ear: Tympanic membrane and ear canal normal. No drainage (clear/yellow), swelling or tenderness.     Nose: Nose normal.  Eyes:     Extraocular Movements: Extraocular movements intact.     Pupils: Pupils are equal, round, and reactive to light.  Cardiovascular:     Rate and Rhythm: Normal rate and regular rhythm.     Pulses: Normal pulses.     Heart sounds: Normal heart sounds.  Pulmonary:     Effort: Pulmonary effort is normal. No respiratory distress.     Breath sounds: Normal breath sounds.  Musculoskeletal:        General: Normal range of motion.     Cervical back: Normal range of motion.     Right lower leg: No edema.     Left lower leg: No edema.  Lymphadenopathy:     Cervical: No cervical adenopathy.  Skin:    Findings: Bruising (L lateral upper arm) present.  Neurological:     General: No focal deficit present.     Mental Status: She is alert and oriented to person, place, and time.     Cranial Nerves: No cranial nerve deficit.  Sensory: No sensory deficit.     Motor: No weakness.     Gait: Gait normal.  Psychiatric:        Mood and Affect: Mood normal.        Thought Content: Thought content normal.     Results for orders placed or performed in visit on 12/24/22  CBC with Differential/Platelet  Result Value Ref Range   WBC 11.1 (H) 4.0 - 10.5 K/uL   RBC 5.10 3.87 - 5.11 Mil/uL   Hemoglobin 14.3 12.0 - 15.0 g/dL   HCT 13.0 86.5 - 78.4 %   MCV 87.8 78.0 - 100.0 fl   MCHC 32.0 30.0 - 36.0 g/dL   RDW 69.6 29.5 - 28.4 %   Platelets 253.0 150.0 - 400.0 K/uL   Neutrophils Relative % 79.2 (H) 43.0 - 77.0 %   Lymphocytes Relative 13.4 12.0 - 46.0 %   Monocytes Relative 6.6 3.0 - 12.0 %   Eosinophils Relative 0.0 0.0 - 5.0 %   Basophils Relative 0.8 0.0 - 3.0 %   Neutro Abs 8.8 (H) 1.4 - 7.7 K/uL   Lymphs Abs 1.5 0.7 - 4.0 K/uL   Monocytes Absolute 0.7 0.1 - 1.0 K/uL   Eosinophils Absolute 0.0 0.0 - 0.7 K/uL   Basophils  Absolute 0.1 0.0 - 0.1 K/uL  Comprehensive metabolic panel  Result Value Ref Range   Sodium 138 135 - 145 mEq/L   Potassium 4.8 Hemolysis seen... 3.5 - 5.1 mEq/L   Chloride 103 96 - 112 mEq/L   CO2 28 19 - 32 mEq/L   Glucose, Bld 115 (H) 70 - 99 mg/dL   BUN 26 (H) 6 - 23 mg/dL   Creatinine, Ser 1.32 0.40 - 1.20 mg/dL   Total Bilirubin 0.8 0.2 - 1.2 mg/dL   Alkaline Phosphatase 92 39 - 117 U/L   AST 17 0 - 37 U/L   ALT 14 0 - 35 U/L   Total Protein 7.7 6.0 - 8.3 g/dL   Albumin 4.1 3.5 - 5.2 g/dL   GFR 44.01 >02.72 mL/min   Calcium 10.8 (H) 8.4 - 10.5 mg/dL        Assessment & Plan:  1. Essential hypertension (Primary)  - CBC with Differential/Platelet  2. Morbid obesity (HCC)   3. Current use of long term anticoagulation  - CBC with Differential/Platelet - Comprehensive metabolic panel - D-dimer, quantitative  4. History of DVT (deep vein thrombosis)  - Comprehensive metabolic panel - D-dimer, quantitative  5. Bruising  - CBC with Differential/Platelet - Comprehensive metabolic panel - D-dimer, quantitative  6. Left arm pain  - D-dimer, quantitative  7. Left leg pain  - D-dimer, quantitative  8. Other infective acute otitis externa of right ear  - acetic acid-hydrocortisone (VOSOL-HC) OTIC solution; Place 3 drops into both ears 2 (two) times daily.  Dispense: 10 mL; Refill: 0  9. Sebaceous cyst  - chlorhexidine (HIBICLENS) 4 % external liquid; Apply topically daily as needed (to underarms, skin folds).  Dispense: 236 mL; Refill: 1   Meds ordered this encounter  Medications   chlorhexidine (HIBICLENS) 4 % external liquid    Sig: Apply topically daily as needed (to underarms, skin folds).    Dispense:  236 mL    Refill:  1   acetic acid-hydrocortisone (VOSOL-HC) OTIC solution    Sig: Place 3 drops into both ears 2 (two) times daily.    Dispense:  10 mL    Refill:  0    Return if symptoms  worsen or fail to improve.  Moshe Cipro, FNP

## 2022-12-25 LAB — D-DIMER, QUANTITATIVE: D-Dimer, Quant: 0.22 ug{FEU}/mL (ref <0.50)

## 2023-01-12 DIAGNOSIS — M79652 Pain in left thigh: Secondary | ICD-10-CM | POA: Diagnosis not present

## 2023-01-12 DIAGNOSIS — M25552 Pain in left hip: Secondary | ICD-10-CM | POA: Diagnosis not present

## 2023-01-19 DIAGNOSIS — M25552 Pain in left hip: Secondary | ICD-10-CM | POA: Diagnosis not present

## 2023-01-20 ENCOUNTER — Ambulatory Visit: Payer: Self-pay | Admitting: Internal Medicine

## 2023-01-20 NOTE — Telephone Encounter (Signed)
Chief Complaint: dizziness Symptoms: room spinning when standing Frequency: yesterday Pertinent Negatives: Patient denies SOB, denies CP, denies CMS changes, denies speech problems, denies weakness, denies numbness/tingling, denies HA  Disposition: [] ED /[] Urgent Care (no appt availability in office) / [x] Appointment(In office/virtual)/ []  [facility]/ [] Home Care/ [] Refused Recommended Disposition /[] [facility]/ []  Follow-up with PCP  Additional Notes: Pt went to ortho yesterday and was given steroids after getting fluid taken off the hip.  After returning home pt was experiencing dizziness and high BP and high blood sugar. BP currently 133/78, BS at 0945 this morning was 165. BS generally runs in the 80s per the pt. Pt states that she has had steroids in the past and her BS responded the same way.  Pt denies hx of diabetes, states she monitors her sugars via her partners machine. Pt states that the dizziness has improved while speaking to Clinical research associate. Pt stated that the dizziness has been intermittent since starting yesterday. Pt stood while on the phone with RN, pt states that she is better at this time. Pt denies CMS changes, denies vision changes other than the dizziness, denies speech problems, pt clear communication on the phone. Pt was already sched with PCP for next week. Pt advised to call back should her dizziness return. Pt advised to increase water intake.   Copied from CRM [id number]. Topic: Clinical - Pink Word Triage >> Jan 20, 2023 11:54 AM Mosetta Putt H wrote: Reason for CRM: blood pressure was 133/78 sugar was 165, taking steroids and aware that can raise on the meds Reason for Disposition  Blood glucose 70-240 mg/dL (3.9 -78.2 mmol/L)  Dizziness caused by sudden or prolonged standing  Answer Assessment - Initial Assessment Questions 1. DESCRIPTION: "Describe your dizziness."     My head is dizzy, I think it is because my sugar is elevated.  2. LIGHTHEADED: "Do  you feel lightheaded?" (e.g., somewhat faint, woozy, weak upon standing)     Dizziness was worse with standing.  3. VERTIGO: "Do you feel like either you or the room is spinning or tilting?" (i.e. vertigo)     The room was spinning 4. SEVERITY: "How bad is it?"  "Do you feel like you are going to faint?" "Can you stand and walk?"   - MILD: Feels slightly dizzy, but walking normally.   - MODERATE: Feels unsteady when walking, but not falling; interferes with normal activities (e.g., school, work).   - SEVERE: Unable to walk without falling, or requires assistance to walk without falling; feels like passing out now.      Balance was not off, room was spinning, standing was an issue 5. ONSET:  "When did the dizziness begin?"     yesterday 6. AGGRAVATING FACTORS: "Does anything make it worse?" (e.g., standing, change in head position)     standing 7. HEART RATE: "Can you tell me your heart rate?" "How many beats in 15 seconds?"  (Note: not all patients can do this)       HR was 105 with sugar being up, dropped down to 85 after sitting back down 8. CAUSE: "What do you think is causing the dizziness?"     Steroid 9. RECURRENT SYMPTOM: "Have you had dizziness before?" If Yes, ask: "When was the last time?" "What happened that time?"     denies 10. OTHER SYMPTOMS: "Do you have any other symptoms?" (e.g., fever, chest pain, vomiting, diarrhea, bleeding)       denies  Answer Assessment - Initial Assessment Questions  1. BLOOD GLUCOSE: "What is your blood glucose level?"      165 at 0945 3. USUAL RANGE: "What is your glucose level usually?" (e.g., usual fasting morning value, usual evening value)     80s 5. TYPE 1 or 2:  "Do you know what type of diabetes you have?"  (e.g., Type 1, Type 2, Gestational; doesn't know)      Never been diagnosed as a diabetic I use my partners glucometer to keep an eye on my sugars 6. INSULIN: "Do you take insulin?" "What type of insulin(s) do you use? What is the mode  of delivery? (syringe, pen; injection or pump)?"      Denies 7. DIABETES PILLS: "Do you take any pills for your diabetes?" If Yes, ask: "Have you missed taking any pills recently?"     denies 8. OTHER SYMPTOMS: "Do you have any symptoms?" (e.g., fever, frequent urination, difficulty breathing, dizziness, weakness, vomiting)     dizziness  Protocols used: Dizziness - Lightheadedness-A-AH, Diabetes - High Blood Sugar-A-AH

## 2023-01-26 ENCOUNTER — Encounter: Payer: Self-pay | Admitting: Internal Medicine

## 2023-01-26 ENCOUNTER — Ambulatory Visit: Payer: Medicare PPO | Admitting: Internal Medicine

## 2023-01-26 VITALS — BP 130/84 | HR 70 | Ht 63.0 in | Wt 335.2 lb

## 2023-01-26 DIAGNOSIS — I10 Essential (primary) hypertension: Secondary | ICD-10-CM

## 2023-01-26 DIAGNOSIS — E538 Deficiency of other specified B group vitamins: Secondary | ICD-10-CM | POA: Diagnosis not present

## 2023-01-26 DIAGNOSIS — E785 Hyperlipidemia, unspecified: Secondary | ICD-10-CM | POA: Diagnosis not present

## 2023-01-26 DIAGNOSIS — Z0001 Encounter for general adult medical examination with abnormal findings: Secondary | ICD-10-CM

## 2023-01-26 DIAGNOSIS — R1013 Epigastric pain: Secondary | ICD-10-CM | POA: Diagnosis not present

## 2023-01-26 DIAGNOSIS — Z0000 Encounter for general adult medical examination without abnormal findings: Secondary | ICD-10-CM

## 2023-01-26 DIAGNOSIS — E559 Vitamin D deficiency, unspecified: Secondary | ICD-10-CM | POA: Diagnosis not present

## 2023-01-26 DIAGNOSIS — R739 Hyperglycemia, unspecified: Secondary | ICD-10-CM

## 2023-01-26 LAB — CBC WITH DIFFERENTIAL/PLATELET
Basophils Absolute: 0.1 10*3/uL (ref 0.0–0.1)
Basophils Relative: 0.8 % (ref 0.0–3.0)
Eosinophils Absolute: 0.1 10*3/uL (ref 0.0–0.7)
Eosinophils Relative: 0.6 % (ref 0.0–5.0)
HCT: 44.5 % (ref 36.0–46.0)
Hemoglobin: 14.2 g/dL (ref 12.0–15.0)
Lymphocytes Relative: 20.5 % (ref 12.0–46.0)
Lymphs Abs: 2.3 10*3/uL (ref 0.7–4.0)
MCHC: 31.9 g/dL (ref 30.0–36.0)
MCV: 87.9 fL (ref 78.0–100.0)
Monocytes Absolute: 0.8 10*3/uL (ref 0.1–1.0)
Monocytes Relative: 7.1 % (ref 3.0–12.0)
Neutro Abs: 7.8 10*3/uL — ABNORMAL HIGH (ref 1.4–7.7)
Neutrophils Relative %: 71 % (ref 43.0–77.0)
Platelets: 267 10*3/uL (ref 150.0–400.0)
RBC: 5.06 Mil/uL (ref 3.87–5.11)
RDW: 15.4 % (ref 11.5–15.5)
WBC: 11 10*3/uL — ABNORMAL HIGH (ref 4.0–10.5)

## 2023-01-26 LAB — LIPID PANEL
Cholesterol: 142 mg/dL (ref 0–200)
HDL: 45.4 mg/dL (ref >39.00)
LDL Cholesterol: 86 mg/dL (ref 0–99)
NonHDL: 96.51
Total CHOL/HDL Ratio: 3
Triglycerides: 53 mg/dL (ref 0.0–149.0)
VLDL: 10.6 mg/dL (ref 0.0–40.0)

## 2023-01-26 LAB — BASIC METABOLIC PANEL
BUN: 23 mg/dL (ref 6–23)
CO2: 31 meq/L (ref 19–32)
Calcium: 10.1 mg/dL (ref 8.4–10.5)
Chloride: 106 meq/L (ref 96–112)
Creatinine, Ser: 0.84 mg/dL (ref 0.40–1.20)
GFR: 72.96 mL/min (ref >60.00)
Glucose, Bld: 99 mg/dL (ref 70–99)
Potassium: 4.7 meq/L (ref 3.5–5.1)
Sodium: 140 meq/L (ref 135–145)

## 2023-01-26 LAB — HEPATIC FUNCTION PANEL
ALT: 11 U/L (ref 0–35)
AST: 12 U/L (ref 0–37)
Albumin: 3.9 g/dL (ref 3.5–5.2)
Alkaline Phosphatase: 79 U/L (ref 39–117)
Bilirubin, Direct: 0.2 mg/dL (ref 0.0–0.3)
Total Bilirubin: 0.8 mg/dL (ref 0.2–1.2)
Total Protein: 6.9 g/dL (ref 6.0–8.3)

## 2023-01-26 LAB — LIPASE: Lipase: 23 U/L (ref 11.0–59.0)

## 2023-01-26 LAB — MICROALBUMIN / CREATININE URINE RATIO
Creatinine,U: 49.4 mg/dL
Microalb Creat Ratio: 1.4 mg/g (ref 0.0–30.0)
Microalb, Ur: <0.7 mg/dL (ref 0.0–1.9)

## 2023-01-26 LAB — VITAMIN D 25 HYDROXY (VIT D DEFICIENCY, FRACTURES): VITD: 39.39 ng/mL (ref 30.00–100.00)

## 2023-01-26 LAB — TSH: TSH: 1.52 u[IU]/mL (ref 0.35–5.50)

## 2023-01-26 LAB — URINALYSIS, ROUTINE W REFLEX MICROSCOPIC
Bilirubin Urine: NEGATIVE
Hgb urine dipstick: NEGATIVE
Ketones, ur: NEGATIVE
Nitrite: NEGATIVE
RBC / HPF: NONE SEEN (ref 0–?)
Specific Gravity, Urine: 1.01 (ref 1.000–1.030)
Total Protein, Urine: NEGATIVE
Urine Glucose: NEGATIVE
Urobilinogen, UA: 0.2 (ref 0.0–1.0)
pH: 7 (ref 5.0–8.0)

## 2023-01-26 LAB — VITAMIN B12: Vitamin B-12: 295 pg/mL (ref 211–911)

## 2023-01-26 LAB — HEMOGLOBIN A1C: Hgb A1c MFr Bld: 6.8 % — ABNORMAL HIGH (ref 4.6–6.5)

## 2023-01-26 MED ORDER — ONDANSETRON 4 MG PO TBDP: 4.0000 mg | ORAL_TABLET | Freq: Three times a day (TID) | ORAL | 0 refills | Status: DC | PRN

## 2023-01-26 MED ORDER — SUCRALFATE 1 G PO TABS: 1.0000 g | ORAL_TABLET | Freq: Three times a day (TID) | ORAL | 0 refills | Status: DC

## 2023-01-26 MED ORDER — CICLOPIROX 8 % EX SOLN: Freq: Every day | CUTANEOUS | 11 refills | Status: AC

## 2023-01-26 MED ORDER — PANTOPRAZOLE SODIUM 40 MG PO TBEC: 40.0000 mg | DELAYED_RELEASE_TABLET | Freq: Two times a day (BID) | ORAL | 3 refills | Status: DC

## 2023-01-26 NOTE — Progress Notes (Signed)
Patient ID: Mia [name], female   DOB: 04/15/1955, 68 y.o.   MRN: [id number]         Chief Complaint:: wellness exam and Hypertension (Was recently on prednisone for about 6 days, and received steroid injections for her hip. Blood pressures and sugars have been elevated since then) and Hyperglycemia  , upper abd pain, onychomycosis,        HPI:  Mia [name] is a 68 y.o. female here for wellness exam; declines all immunizations, colonoscopy, and Dxa for now, o/w up to date                        Also most recently with steroid injection to left hip per ortho, now sugars mildly worsening.   Pt denies polydipsia, polyuria, or new focal neuro s/s. Now for PT, but BP has been elevated as well no further falls since last fall to right side last sept with right knee injury with bruising, now resolved.  Wt down almost 20 lbs with better diet but also upper abd pain and n/v with even oatmeal x last 2 days without radiation, blood, fever, chills, Denies worsening reflux, dysphagia, other bowel change.  Pt is s/p CCX.  Unable to consider mounjaro due to current GI symptoms.   Last EGD colonoscopy 2011   Wt Readings from Last 3 Encounters:  01/26/23 (!) 335 lb 3.2 oz (152 kg)  12/24/22 (!) 336 lb 12.8 oz (152.8 kg)  08/05/22 (!) 352 lb (159.7 kg)   BP Readings from Last 3 Encounters:  01/26/23 130/84  12/24/22 130/86  12/20/22 (!) 203/111   Immunization History  Administered Date(s) Administered   PFIZER Comirnaty(Gray Top)Covid-19 Tri-Sucrose Vaccine 04/06/2019, 04/28/2019   Tdap 05/22/2014   Health Maintenance Due  Topic Date Due   Medicare Annual Wellness (AWV)  Never done   Pneumonia Vaccine 55+ Years old (1 of 2 - PCV) Never done   Zoster Vaccines- Shingrix (1 of 2) Never done   Cervical Cancer Screening (HPV/Pap Cotest)  08/15/2016   INFLUENZA VACCINE  Never done   DEXA SCAN  Never done      Past Medical History:  Diagnosis Date   Allergic rhinitis, cause unspecified  04/29/2011   Anemia    Anemia, unspecified 04/23/2011   Back pain    Bilateral swelling of feet and ankles    Chronic anticoagulation    Chronic headaches    Colon polyps 05/2009   Complication of anesthesia    hard to wake up   Constipation    Diverticulosis    DVT (deep venous thrombosis) (HCC)    right upper extremitiy   GERD (gastroesophageal reflux disease) 11/10/2011   Gout 04/22/2010   no current problems per patient on 12/16/21   H/O blood clots    H/O: GI bleed    Hyperlipidemia    Hyperplastic colon polyp    Hypertension    IBS (irritable bowel syndrome)    Infected prosthetic knee joint (HCC)    Joint pain    Migraine 04/29/2011   otc meds prn   Morbid obesity (HCC)    Nonsmoker    Other fatigue    Pre-diabetes    no meds   Pulmonary embolism (HCC) 2010   following right knee replacement   Shortness of breath    with exertion   SOBOE (shortness of breath on exertion)    Vitamin D deficiency    WPW (Wolff-Parkinson-White syndrome)    Past  Surgical History:  Procedure Laterality Date   CHOLECYSTECTOMY     open surgery with abd incision   COLONOSCOPY     x 7   DIAGNOSTIC LAPAROSCOPY Left    knee   DIAGNOSTIC LAPAROSCOPY Right    right wrist surgery for cyst   KNEE ARTHROSCOPY Left    MULTIPLE TOOTH EXTRACTIONS     all upper teeth extracted - upper partial   RADIOLOGY WITH ANESTHESIA N/A 04/08/2022   Procedure: CT ABDOMEN WITH AND WITHOUT CONTRAST;  Surgeon: Radiologist, Medication, MD;  Location: [facility] OR;  Service: Radiology;  Laterality: N/A;   TOTAL KNEE ARTHROPLASTY Right 01/05/2008   followed by I & D for infection  x 2   TUBAL LIGATION     WISDOM TOOTH EXTRACTION      reports that she has never smoked. She has never used smokeless tobacco. She reports that she does not drink alcohol and does not use drugs. family history includes Colon cancer in an other family member; Coronary artery disease in an other family member; Diabetes in her father;  Heart Problems in her mother; Heart disease in her mother and another family member; Hypertension in her brother, sister, sister, and sister; Pancreatic cancer in her sister; Throat cancer in her father. Allergies  Allergen Reactions   Crestor [Rosuvastatin Calcium] Other (See Comments)    Chest pain   Amlodipine Other (See Comments)    Edema, headache    Ultram [Tramadol] Nausea Only   Zosyn [Piperacillin Sod-Tazobactam So] Other (See Comments)    REACTION: "UNSURE"   Current Outpatient Medications on File Prior to Visit  Medication Sig Dispense Refill   acetaminophen (TYLENOL) 650 MG CR tablet Take 1,300 mg by mouth at bedtime.     acetic acid-hydrocortisone (VOSOL-HC) OTIC solution Place 3 drops into both ears 2 (two) times daily. 10 mL 0   atorvastatin (LIPITOR) 80 MG tablet Take 1 tablet (80 mg total) by mouth daily. 90 tablet 3   bisacodyl (DULCOLAX) 5 MG EC tablet Take 15 mg by mouth daily as needed for moderate constipation.     carvedilol (COREG) 6.25 MG tablet TAKE 1 TABLET(6.25 MG) BY MOUTH TWICE DAILY 180 tablet 3   chlorhexidine (HIBICLENS) 4 % external liquid Apply topically daily as needed (to underarms, skin folds). 236 mL 1   Cholecalciferol (VITAMIN D3) 50 MCG (2000 UT) CAPS Take 4,000 Units by mouth daily.     HYDROcodone-acetaminophen (NORCO/VICODIN) 5-325 MG tablet Take 1 tablet by mouth every 6 (six) hours as needed for moderate pain (pain score 4-6) or severe pain (pain score 7-10). 8 tablet 0   losartan (COZAAR) 100 MG tablet Take 1 tablet (100 mg total) by mouth daily. 90 tablet 3   meclizine (ANTIVERT) 12.5 MG tablet Take 1 tablet (12.5 mg total) by mouth 3 (three) times daily as needed for dizziness. 40 tablet 1   polyethylene glycol powder (GLYCOLAX/MIRALAX) 17 GM/SCOOP powder Take twice daily ( 17gr) until stooling regular, then once daily 3350 g 1   rivaroxaban (XARELTO) 20 MG TABS tablet TAKE 1 TABLET BY MOUTH DAILY WITH SUPPER 90 tablet 1   spironolactone  (ALDACTONE) 25 MG tablet TAKE 1 TABLET(25 MG) BY MOUTH DAILY 90 tablet 3   predniSONE (STERAPRED UNI-PAK 21 TAB) 10 MG (21) TBPK tablet Take by mouth daily. Take as directed. (Patient not taking: Reported on 01/26/2023) 21 tablet 0   No current facility-administered medications on file prior to visit.        ROS:  All others reviewed and negative.  Objective        PE:  BP 130/84 (BP Location: Right Arm, Patient Position: Sitting, Cuff Size: Normal)   Pulse 70   Ht 5\' 3"  (1.6 m)   Wt (!) 335 lb 3.2 oz (152 kg)   LMP 04/10/2010   SpO2 97%   BMI 59.38 kg/m                 Constitutional: Pt appears in NAD               HENT: Head: NCAT.                Right Ear: External ear normal.                 Left Ear: External ear normal.                Eyes: . Pupils are equal, round, and reactive to light. Conjunctivae and EOM are normal               Nose: without d/c or deformity               Neck: Neck supple. Gross normal ROM               Cardiovascular: Normal rate and regular rhythm.                 Pulmonary/Chest: Effort normal and breath sounds without rales or wheezing.                Abd:  Soft, mild epigastric tender, ND, + BS, no organomegaly               Neurological: Pt is alert. At baseline orientation, motor grossly intact               Skin: Skin is warm. No rashes, no other new lesions, LE edema - none               Psychiatric: Pt behavior is normal without agitation   Micro: none  Cardiac tracings I have personally interpreted today:  none  Pertinent Radiological findings (summarize): none   Lab Results  Component Value Date   WBC 11.0 (H) 01/26/2023   HGB 14.2 01/26/2023   HCT 44.5 01/26/2023   PLT 267.0 01/26/2023   GLUCOSE 99 01/26/2023   CHOL 142 01/26/2023   TRIG 53.0 01/26/2023   HDL 45.40 01/26/2023   LDLDIRECT 148.9 12/11/2012   LDLCALC 86 01/26/2023   ALT 11 01/26/2023   AST 12 01/26/2023   NA 140 01/26/2023   K 4.7 01/26/2023   CL 106  01/26/2023   CREATININE 0.84 01/26/2023   BUN 23 01/26/2023   CO2 31 01/26/2023   TSH 1.52 01/26/2023   INR 2.8 03/28/2018   HGBA1C 6.8 (H) 01/26/2023   MICROALBUR <0.7 01/26/2023   Assessment/Plan:  Mia [name] is a 68 y.o. Black or African American [2] female with  has a past medical history of Allergic rhinitis, cause unspecified (04/29/2011), Anemia, Anemia, unspecified (04/23/2011), Back pain, Bilateral swelling of feet and ankles, Chronic anticoagulation, Chronic headaches, Colon polyps (05/2009), Complication of anesthesia, Constipation, Diverticulosis, DVT (deep venous thrombosis) (HCC), GERD (gastroesophageal reflux disease) (11/10/2011), Gout (04/22/2010), H/O blood clots, H/O: GI bleed, Hyperlipidemia, Hyperplastic colon polyp, Hypertension, IBS (irritable bowel syndrome), Infected prosthetic knee joint (HCC), Joint pain, Migraine (04/29/2011), Morbid obesity (HCC), Nonsmoker, Other fatigue, Pre-diabetes, Pulmonary embolism (HCC) (2010), Shortness of breath,  SOBOE (shortness of breath on exertion), Vitamin D deficiency, and WPW (Wolff-Parkinson-White syndrome).  Encounter for well adult exam with abnormal findings Age and sex appropriate education and counseling updated with regular exercise and diet Referrals for preventative services - declines dxa and colonoscopy for now Immunizations addressed - declines all Smoking counseling  - none needed Evidence for depression or other mood disorder - none significant Most recent labs reviewed. I have personally reviewed and have noted: 1) the patient's medical and social history 2) The patient's current medications and supplements 3) The patient's height, weight, and BMI have been recorded in the chart   B12 deficiency Lab Results  Component Value Date   VITAMINB12 295 01/26/2023   Low, to start oral replacement - b12 1000 mcg qd   Dyslipidemia Lab Results  Component Value Date   LDLCALC 86 01/26/2023   uncontrolled,  pt to continue current statin lipitor 80 mg and lower chol diet, declines other change for now   HTN (hypertension) BP Readings from Last 3 Encounters:  01/26/23 130/84  12/24/22 130/86  12/20/22 (!) 203/111   Stable, pt to continue medical treatment coreg 6,25 bid, losartan 100 qd   Hyperglycemia Lab Results  Component Value Date   HGBA1C 6.8 (H) 01/26/2023   Mild uncontrolled, likely due to recent steroid tx, pt to continue current medical treatment  - diet, wt control   Vitamin D deficiency Last vitamin D Lab Results  Component Value Date   VD25OH 39.39 01/26/2023   Low, to start oral replacement   Abdominal pain Epigastric c/w probable gastritis, for increased protonix 40 bid, carafate 1 qid x 1 mo, zofran prn, f/u lab, refer GI  Followup: Return in about 4 months (around 05/26/2023).  Oliver Barre, MD 01/29/2023 8:17 PM [facility]

## 2023-01-26 NOTE — Patient Instructions (Addendum)
Ok to increase the protonix to twice per day  Please take all new medication as prescribed - the carafate to coat the stomach, and zofran as needed for nausea, and the generic Penlac for the nails  Please continue all other medications as before, and refills have been done if requested.  Please have the pharmacy call with any other refills you may need.  Please continue your efforts at being more active, low cholesterol diet, and weight control.  You are otherwise up to date with prevention measures today.  Please keep your appointments with your specialists as you may have planned  You will be contacted regarding the referral for: Gastroenterology  Please go to the LAB at the blood drawing area for the tests to be done  You will be contacted by phone if any changes need to be made immediately.  Otherwise, you will receive a letter about your results with an explanation, but please check with MyChart first.  Ok to cancel the feb 3 appt here with me  Please make an Appointment to return in 4 months, or sooner if needed

## 2023-01-26 NOTE — Assessment & Plan Note (Signed)
Age and sex appropriate education and counseling updated with regular exercise and diet Referrals for preventative services - declines dxa and colonoscopy for now Immunizations addressed - declines all Smoking counseling  - none needed Evidence for depression or other mood disorder - none significant Most recent labs reviewed. I have personally reviewed and have noted: 1) the patient's medical and social history 2) The patient's current medications and supplements 3) The patient's height, weight, and BMI have been recorded in the chart

## 2023-01-29 ENCOUNTER — Encounter: Payer: Self-pay | Admitting: Internal Medicine

## 2023-01-29 NOTE — Assessment & Plan Note (Signed)
Lab Results  Component Value Date   HGBA1C 6.8 (H) 01/26/2023   Mild uncontrolled, likely due to recent steroid tx, pt to continue current medical treatment  - diet, wt control

## 2023-01-29 NOTE — Assessment & Plan Note (Signed)
Last vitamin D Lab Results  Component Value Date   VD25OH 39.39 01/26/2023   Low, to start oral replacement

## 2023-01-29 NOTE — Assessment & Plan Note (Signed)
Lab Results  Component Value Date   LDLCALC 86 01/26/2023   uncontrolled, pt to continue current statin lipitor 80 mg and lower chol diet, declines other change for now

## 2023-01-29 NOTE — Assessment & Plan Note (Signed)
Epigastric c/w probable gastritis, for increased protonix 40 bid, carafate 1 qid x 1 mo, zofran prn, f/u lab, refer GI

## 2023-01-29 NOTE — Assessment & Plan Note (Signed)
BP Readings from Last 3 Encounters:  01/26/23 130/84  12/24/22 130/86  12/20/22 (!) 203/111   Stable, pt to continue medical treatment coreg 6,25 bid, losartan 100 qd

## 2023-01-29 NOTE — Assessment & Plan Note (Signed)
Lab Results  Component Value Date   VITAMINB12 295 01/26/2023   Low, to start oral replacement - b12 1000 mcg qd

## 2023-02-07 ENCOUNTER — Ambulatory Visit: Payer: BC Managed Care – PPO | Admitting: Internal Medicine

## 2023-02-09 ENCOUNTER — Encounter (HOSPITAL_BASED_OUTPATIENT_CLINIC_OR_DEPARTMENT_OTHER): Payer: Self-pay | Admitting: Physical Therapy

## 2023-02-09 ENCOUNTER — Ambulatory Visit (HOSPITAL_BASED_OUTPATIENT_CLINIC_OR_DEPARTMENT_OTHER): Payer: Medicare PPO | Attending: Physician Assistant | Admitting: Physical Therapy

## 2023-02-09 ENCOUNTER — Other Ambulatory Visit: Payer: Self-pay

## 2023-02-09 DIAGNOSIS — M6281 Muscle weakness (generalized): Secondary | ICD-10-CM | POA: Insufficient documentation

## 2023-02-09 DIAGNOSIS — R2689 Other abnormalities of gait and mobility: Secondary | ICD-10-CM | POA: Insufficient documentation

## 2023-02-09 DIAGNOSIS — M25552 Pain in left hip: Secondary | ICD-10-CM | POA: Diagnosis not present
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
## 2023-02-25 ENCOUNTER — Ambulatory Visit (HOSPITAL_BASED_OUTPATIENT_CLINIC_OR_DEPARTMENT_OTHER): Payer: Medicare PPO | Admitting: Physical Therapy

## 2023-03-02 ENCOUNTER — Encounter (HOSPITAL_BASED_OUTPATIENT_CLINIC_OR_DEPARTMENT_OTHER): Payer: Self-pay | Admitting: Physical Therapy

## 2023-03-02 ENCOUNTER — Ambulatory Visit (HOSPITAL_BASED_OUTPATIENT_CLINIC_OR_DEPARTMENT_OTHER): Payer: Medicare PPO | Admitting: Physical Therapy

## 2023-03-02 DIAGNOSIS — R2689 Other abnormalities of gait and mobility: Secondary | ICD-10-CM

## 2023-03-02 DIAGNOSIS — M25552 Pain in left hip: Secondary | ICD-10-CM

## 2023-03-02 DIAGNOSIS — M6281 Muscle weakness (generalized): Secondary | ICD-10-CM
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
## 2023-03-09 ENCOUNTER — Ambulatory Visit (HOSPITAL_BASED_OUTPATIENT_CLINIC_OR_DEPARTMENT_OTHER): Payer: Medicare PPO | Attending: Physician Assistant | Admitting: Physical Therapy

## 2023-03-09 DIAGNOSIS — M25552 Pain in left hip: Secondary | ICD-10-CM | POA: Diagnosis not present

## 2023-03-09 DIAGNOSIS — M6281 Muscle weakness (generalized): Secondary | ICD-10-CM | POA: Insufficient documentation

## 2023-03-09 DIAGNOSIS — R2689 Other abnormalities of gait and mobility: Secondary | ICD-10-CM | POA: Insufficient documentation
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
## 2023-03-16 ENCOUNTER — Encounter (HOSPITAL_BASED_OUTPATIENT_CLINIC_OR_DEPARTMENT_OTHER): Payer: Self-pay | Admitting: Physical Therapy

## 2023-03-16 ENCOUNTER — Ambulatory Visit (HOSPITAL_BASED_OUTPATIENT_CLINIC_OR_DEPARTMENT_OTHER): Payer: Medicare PPO | Admitting: Physical Therapy

## 2023-03-16 DIAGNOSIS — M6281 Muscle weakness (generalized): Secondary | ICD-10-CM | POA: Diagnosis not present

## 2023-03-16 DIAGNOSIS — M25552 Pain in left hip: Secondary | ICD-10-CM

## 2023-03-16 DIAGNOSIS — R2689 Other abnormalities of gait and mobility: Secondary | ICD-10-CM
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
## 2023-03-23 ENCOUNTER — Ambulatory Visit (HOSPITAL_BASED_OUTPATIENT_CLINIC_OR_DEPARTMENT_OTHER): Payer: Medicare PPO | Admitting: Physical Therapy

## 2023-03-23 ENCOUNTER — Encounter (HOSPITAL_BASED_OUTPATIENT_CLINIC_OR_DEPARTMENT_OTHER): Payer: Self-pay | Admitting: Physical Therapy

## 2023-03-23 DIAGNOSIS — M6281 Muscle weakness (generalized): Secondary | ICD-10-CM

## 2023-03-23 DIAGNOSIS — M25552 Pain in left hip: Secondary | ICD-10-CM | POA: Diagnosis not present

## 2023-03-23 DIAGNOSIS — R2689 Other abnormalities of gait and mobility: Secondary | ICD-10-CM
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
## 2023-03-24 ENCOUNTER — Encounter (HOSPITAL_BASED_OUTPATIENT_CLINIC_OR_DEPARTMENT_OTHER): Payer: Self-pay | Admitting: Physical Therapy

## 2023-03-30 ENCOUNTER — Ambulatory Visit (HOSPITAL_BASED_OUTPATIENT_CLINIC_OR_DEPARTMENT_OTHER): Payer: Medicare PPO | Admitting: Physical Therapy

## 2023-04-06 ENCOUNTER — Ambulatory Visit (HOSPITAL_BASED_OUTPATIENT_CLINIC_OR_DEPARTMENT_OTHER): Payer: Medicare PPO | Attending: Physician Assistant

## 2023-04-06 ENCOUNTER — Encounter (HOSPITAL_BASED_OUTPATIENT_CLINIC_OR_DEPARTMENT_OTHER): Payer: Self-pay

## 2023-04-06 DIAGNOSIS — R2689 Other abnormalities of gait and mobility: Secondary | ICD-10-CM | POA: Insufficient documentation

## 2023-04-06 DIAGNOSIS — M6281 Muscle weakness (generalized): Secondary | ICD-10-CM | POA: Diagnosis not present

## 2023-04-06 DIAGNOSIS — M25552 Pain in left hip: Secondary | ICD-10-CM | POA: Diagnosis not present
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
## 2023-04-13 ENCOUNTER — Encounter (HOSPITAL_BASED_OUTPATIENT_CLINIC_OR_DEPARTMENT_OTHER): Payer: Self-pay | Admitting: Physical Therapy

## 2023-04-13 ENCOUNTER — Ambulatory Visit (HOSPITAL_BASED_OUTPATIENT_CLINIC_OR_DEPARTMENT_OTHER): Payer: Medicare PPO | Admitting: Physical Therapy

## 2023-04-13 DIAGNOSIS — R2689 Other abnormalities of gait and mobility: Secondary | ICD-10-CM

## 2023-04-13 DIAGNOSIS — M25552 Pain in left hip: Secondary | ICD-10-CM

## 2023-04-13 DIAGNOSIS — M6281 Muscle weakness (generalized): Secondary | ICD-10-CM | POA: Diagnosis not present
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
## 2023-04-17 ENCOUNTER — Other Ambulatory Visit: Payer: Self-pay | Admitting: Cardiology

## 2023-04-17 DIAGNOSIS — Z86718 Personal history of other venous thrombosis and embolism: Secondary | ICD-10-CM

## 2023-04-18 NOTE — Telephone Encounter (Signed)
 Prescription refill request for Xarelto received.  Indication:dvt Last office visit:upcoming Weight:152  kg Age:68 Scr:0.84  1/25 CrCl:160.22  ml/min  Prescription refilled

## 2023-04-21 ENCOUNTER — Encounter (HOSPITAL_BASED_OUTPATIENT_CLINIC_OR_DEPARTMENT_OTHER): Payer: Self-pay | Admitting: Physical Therapy

## 2023-04-21 ENCOUNTER — Ambulatory Visit (HOSPITAL_BASED_OUTPATIENT_CLINIC_OR_DEPARTMENT_OTHER): Payer: Self-pay | Admitting: Physical Therapy

## 2023-04-21 DIAGNOSIS — M6281 Muscle weakness (generalized): Secondary | ICD-10-CM | POA: Diagnosis not present

## 2023-04-21 DIAGNOSIS — M25552 Pain in left hip: Secondary | ICD-10-CM | POA: Diagnosis not present

## 2023-04-21 DIAGNOSIS — R2689 Other abnormalities of gait and mobility: Secondary | ICD-10-CM | POA: Diagnosis not present
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
## 2023-05-09 ENCOUNTER — Ambulatory Visit (HOSPITAL_BASED_OUTPATIENT_CLINIC_OR_DEPARTMENT_OTHER): Attending: Physician Assistant | Admitting: Physical Therapy

## 2023-05-09 ENCOUNTER — Encounter (HOSPITAL_BASED_OUTPATIENT_CLINIC_OR_DEPARTMENT_OTHER): Payer: Self-pay | Admitting: Physical Therapy

## 2023-05-09 DIAGNOSIS — R2689 Other abnormalities of gait and mobility: Secondary | ICD-10-CM | POA: Diagnosis not present

## 2023-05-09 DIAGNOSIS — M6281 Muscle weakness (generalized): Secondary | ICD-10-CM | POA: Diagnosis not present

## 2023-05-09 DIAGNOSIS — M25552 Pain in left hip: Secondary | ICD-10-CM | POA: Diagnosis not present
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
## 2023-05-11 ENCOUNTER — Other Ambulatory Visit: Payer: Self-pay | Admitting: Internal Medicine

## 2023-05-11 DIAGNOSIS — Z5181 Encounter for therapeutic drug level monitoring: Secondary | ICD-10-CM

## 2023-05-11 DIAGNOSIS — I5032 Chronic diastolic (congestive) heart failure: Secondary | ICD-10-CM

## 2023-05-13 ENCOUNTER — Encounter (HOSPITAL_BASED_OUTPATIENT_CLINIC_OR_DEPARTMENT_OTHER): Payer: Self-pay | Admitting: Physical Therapy

## 2023-05-13 ENCOUNTER — Ambulatory Visit (HOSPITAL_BASED_OUTPATIENT_CLINIC_OR_DEPARTMENT_OTHER): Admitting: Physical Therapy

## 2023-05-13 DIAGNOSIS — M6281 Muscle weakness (generalized): Secondary | ICD-10-CM

## 2023-05-13 DIAGNOSIS — M25552 Pain in left hip: Secondary | ICD-10-CM

## 2023-05-13 DIAGNOSIS — R2689 Other abnormalities of gait and mobility: Secondary | ICD-10-CM | POA: Diagnosis not present
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
## 2023-05-19 ENCOUNTER — Ambulatory Visit (HOSPITAL_BASED_OUTPATIENT_CLINIC_OR_DEPARTMENT_OTHER): Admitting: Physical Therapy

## 2023-05-26 ENCOUNTER — Ambulatory Visit (INDEPENDENT_AMBULATORY_CARE_PROVIDER_SITE_OTHER): Payer: Medicare PPO | Admitting: Internal Medicine

## 2023-05-26 ENCOUNTER — Encounter: Payer: Self-pay | Admitting: Internal Medicine

## 2023-05-26 ENCOUNTER — Ambulatory Visit: Payer: Self-pay | Admitting: Internal Medicine

## 2023-05-26 VITALS — BP 122/78 | HR 60 | Temp 98.2°F | Ht 63.0 in | Wt 343.0 lb

## 2023-05-26 DIAGNOSIS — I10 Essential (primary) hypertension: Secondary | ICD-10-CM

## 2023-05-26 DIAGNOSIS — G8929 Other chronic pain: Secondary | ICD-10-CM

## 2023-05-26 DIAGNOSIS — M545 Low back pain, unspecified: Secondary | ICD-10-CM | POA: Diagnosis not present

## 2023-05-26 DIAGNOSIS — E559 Vitamin D deficiency, unspecified: Secondary | ICD-10-CM

## 2023-05-26 DIAGNOSIS — M25552 Pain in left hip: Secondary | ICD-10-CM

## 2023-05-26 DIAGNOSIS — E538 Deficiency of other specified B group vitamins: Secondary | ICD-10-CM

## 2023-05-26 DIAGNOSIS — E1165 Type 2 diabetes mellitus with hyperglycemia: Secondary | ICD-10-CM | POA: Insufficient documentation

## 2023-05-26 DIAGNOSIS — M7072 Other bursitis of hip, left hip: Secondary | ICD-10-CM | POA: Diagnosis not present

## 2023-05-26 LAB — BASIC METABOLIC PANEL WITH GFR
BUN: 18 mg/dL (ref 6–23)
CO2: 31 meq/L (ref 19–32)
Calcium: 10.3 mg/dL (ref 8.4–10.5)
Chloride: 106 meq/L (ref 96–112)
Creatinine, Ser: 0.81 mg/dL (ref 0.40–1.20)
GFR: 76.04 mL/min (ref >60.00)
Glucose, Bld: 89 mg/dL (ref 70–99)
Potassium: 4.1 meq/L (ref 3.5–5.1)
Sodium: 142 meq/L (ref 135–145)

## 2023-05-26 LAB — LIPID PANEL
Cholesterol: 158 mg/dL (ref 0–200)
HDL: 46.6 mg/dL (ref >39.00)
LDL Cholesterol: 96 mg/dL (ref 0–99)
NonHDL: 111.52
Total CHOL/HDL Ratio: 3
Triglycerides: 80 mg/dL (ref 0.0–149.0)
VLDL: 16 mg/dL (ref 0.0–40.0)

## 2023-05-26 LAB — HEPATIC FUNCTION PANEL
ALT: 11 U/L (ref 0–35)
AST: 14 U/L (ref 0–37)
Albumin: 4 g/dL (ref 3.5–5.2)
Alkaline Phosphatase: 90 U/L (ref 39–117)
Bilirubin, Direct: 0.2 mg/dL (ref 0.0–0.3)
Total Bilirubin: 0.9 mg/dL (ref 0.2–1.2)
Total Protein: 7.1 g/dL (ref 6.0–8.3)

## 2023-05-26 LAB — HEMOGLOBIN A1C: Hgb A1c MFr Bld: 6.4 % (ref 4.6–6.5)

## 2023-05-26 NOTE — Progress Notes (Signed)
 Patient ID: Mia [name], female   DOB: [birth year], 68 y.o.   MRN: [id number]       Chief Complaint: follow up constipation, low vit d and b12, left hip bursitis, new onset DM       HPI:  Mia [name] is a 68 y.o. female here with c/o worsening left hip pain and limping to walk despite recent cortisone per ortho; having to use cane and walker all the time to avoid falling.  Has ongoing constipation, plans to see GI tomorrow.  Pt denies chest pain, increased sob or doe, wheezing, orthopnea, PND, increased LE swelling, palpitations, dizziness or syncope.   Pt denies polydipsia, polyuria, or new focal neuro s/s.    Pt denies fever, wt loss, night sweats, loss of appetite, or other constitutional symptoms  Also has cardiology appt soon.  Also right ear itching occasionally but no pain or d/c.  Pt continues to have recurring LBP without change in severity, bowel or bladder change, fever, wt loss,  worsening LE pain/numbness/weakness, gait change or falls.       Wt Readings from Last 3 Encounters:  05/27/23 (!) 343 lb (155.6 kg)  05/26/23 (!) 343 lb (155.6 kg)  01/26/23 (!) 335 lb 3.2 oz (152 kg)   BP Readings from Last 3 Encounters:  05/27/23 136/80  05/26/23 122/78  01/26/23 130/84         Past Medical History:  Diagnosis Date   Allergic rhinitis, cause unspecified 04/29/2011   Anemia    Anemia, unspecified 04/23/2011   Back pain    Bilateral swelling of feet and ankles    Chronic anticoagulation    Chronic headaches    Colon polyps 05/2009   Complication of anesthesia    hard to wake up   Constipation    Diverticulosis    DVT (deep venous thrombosis) (HCC)    right upper extremitiy   GERD (gastroesophageal reflux disease) 11/10/2011   Gout 04/22/2010   no current problems per patient on 12/16/21   H/O blood clots    H/O: GI bleed    Hyperlipidemia    Hyperplastic colon polyp    Hypertension    IBS (irritable bowel syndrome)    Infected prosthetic knee joint (HCC)     Joint pain    Migraine 04/29/2011   otc meds prn   Morbid obesity (HCC)    Nonsmoker    Other fatigue    Pre-diabetes    no meds   Pulmonary embolism (HCC) 2010   following right knee replacement   Shortness of breath    with exertion   SOBOE (shortness of breath on exertion)    Vitamin D  deficiency    WPW (Wolff-Parkinson-White syndrome)    Past Surgical History:  Procedure Laterality Date   CHOLECYSTECTOMY     open surgery with abd incision   COLONOSCOPY     x 7   DIAGNOSTIC LAPAROSCOPY Left    knee   DIAGNOSTIC LAPAROSCOPY Right    right wrist surgery for cyst   KNEE ARTHROSCOPY Left    MULTIPLE TOOTH EXTRACTIONS     all upper teeth extracted - upper partial   RADIOLOGY WITH ANESTHESIA N/A 04/08/2022   Procedure: CT ABDOMEN WITH AND WITHOUT CONTRAST;  Surgeon: Radiologist, Medication, MD;  Location: [facility] OR;  Service: Radiology;  Laterality: N/A;   TOTAL KNEE ARTHROPLASTY Right 01/05/2008   followed by [facility] & [facility] for infection  x 2   TUBAL LIGATION     WISDOM  TOOTH EXTRACTION      reports that she has never smoked. She has never used smokeless tobacco. She reports that she does not drink alcohol and does not use drugs. family history includes Colon cancer in an other family member; Coronary artery disease in an other family member; Diabetes in her father; Heart Problems in her mother; Heart disease in her mother and another family member; Hypertension in her brother, sister, sister, and sister; Pancreatic cancer in her sister; Throat cancer in her father. Allergies  Allergen Reactions   Crestor [Rosuvastatin Calcium ] Other (See Comments)    Chest pain   Amlodipine Other (See Comments)    Edema, headache    Ultram  [Tramadol ] Nausea Only   Zosyn [Piperacillin Sod-Tazobactam So] Other (See Comments)    REACTION: "UNSURE"   Current Outpatient Medications on File Prior to Visit  Medication Sig Dispense Refill   acetaminophen  (TYLENOL ) 650 MG CR tablet Take 1,300 mg by mouth at  bedtime.     acetic acid -hydrocortisone  (VOSOL -HC) OTIC solution Place 3 drops into both ears 2 (two) times daily. 10 mL 0   atorvastatin  (LIPITOR) 80 MG tablet Take 1 tablet (80 mg total) by mouth daily. 90 tablet 3   bisacodyl (DULCOLAX) 5 MG EC tablet Take 15 mg by mouth daily as needed for moderate constipation. (Patient not taking: Reported on 05/27/2023)     carvedilol  (COREG ) 6.25 MG tablet TAKE 1 TABLET(6.25 MG) BY MOUTH TWICE DAILY 180 tablet 3   chlorhexidine  (HIBICLENS ) 4 % external liquid Apply topically daily as needed (to underarms, skin folds). (Patient not taking: Reported on 05/27/2023) 236 mL 1   Cholecalciferol (VITAMIN D3) 50 MCG (2000 UT) CAPS Take 4,000 Units by mouth daily.     ciclopirox  (PENLAC ) 8 % solution Apply topically at bedtime. Apply over nail and surrounding skin. Apply daily over previous coat. After seven (7) days, may remove with alcohol and continue cycle. 6.6 mL 11   diclofenac  Sodium (VOLTAREN ) 1 % GEL Apply 1 Application topically.     HYDROcodone -acetaminophen  (NORCO/VICODIN) 5-325 MG tablet Take 1 tablet by mouth every 6 (six) hours as needed for moderate pain (pain score 4-6) or severe pain (pain score 7-10). (Patient not taking: Reported on 05/27/2023) 8 tablet 0   losartan  (COZAAR ) 100 MG tablet Take 1 tablet (100 mg total) by mouth daily. 90 tablet 3   meclizine  (ANTIVERT ) 12.5 MG tablet Take 1 tablet (12.5 mg total) by mouth 3 (three) times daily as needed for dizziness. (Patient not taking: Reported on 05/27/2023) 40 tablet 1   ondansetron  (ZOFRAN -ODT) 4 MG disintegrating tablet Take 1 tablet (4 mg total) by mouth every 8 (eight) hours as needed for nausea or vomiting. (Patient not taking: Reported on 05/27/2023) 30 tablet 0   polyethylene glycol powder (GLYCOLAX /MIRALAX ) 17 GM/SCOOP powder Take twice daily ( 17gr) until stooling regular, then once daily 3350 g 1   predniSONE  (STERAPRED UNI-PAK 21 TAB) 10 MG (21) TBPK tablet Take by mouth daily. Take as  directed. 21 tablet 0   spironolactone  (ALDACTONE ) 25 MG tablet TAKE 1 TABLET(25 MG) BY MOUTH DAILY 90 tablet 3   sucralfate  (CARAFATE ) 1 g tablet Take 1 tablet (1 g total) by mouth 4 (four) times daily -  with meals and at bedtime. 120 tablet 0   XARELTO  20 MG TABS tablet TAKE 1 TABLET BY MOUTH DAILY WITH SUPPER 90 tablet 1   No current facility-administered medications on file prior to visit.        ROS:  All others reviewed and negative.  Objective        PE:  BP 122/78 (BP Location: Right Arm, Patient Position: Sitting, Cuff Size: Normal)   Pulse 60   Temp 98.2 F (36.8 C) (Oral)   Ht 5\' 3"  (1.6 m)   Wt (!) 343 lb (155.6 kg)   LMP 04/10/2010   SpO2 98%   BMI 60.76 kg/m                 Constitutional: Pt appears in NAD but slow to stand,,, limps to walk               HENT: Head: NCAT.                Right Ear: External ear normal.                 Left Ear: External ear normal.                Eyes: . Pupils are equal, round, and reactive to light. Conjunctivae and EOM are normal               Nose: without d/c or deformity               Neck: Neck supple. Gross normal ROM               Cardiovascular: Normal rate and regular rhythm.                 Pulmonary/Chest: Effort normal and breath sounds without rales or wheezing.                Abd:  Soft, NT, ND, + BS, no organomegaly; tender over left lateral hip aea               Neurological: Pt is alert. At baseline orientation, motor grossly intact               Skin: Skin is warm. No rashes, no other new lesions, LE edema - none               Psychiatric: Pt behavior is normal without agitation   Micro: none  Cardiac tracings I have personally interpreted today:  none  Pertinent Radiological findings (summarize): none   Lab Results  Component Value Date   WBC 11.0 (H) 01/26/2023   HGB 14.2 01/26/2023   HCT 44.5 01/26/2023   PLT 267.0 01/26/2023   GLUCOSE 89 05/26/2023   CHOL 158 05/26/2023   TRIG 80.0 05/26/2023    HDL 46.60 05/26/2023   LDLDIRECT 148.9 12/11/2012   LDLCALC 96 05/26/2023   ALT 11 05/26/2023   AST 14 05/26/2023   NA 142 05/26/2023   K 4.1 05/26/2023   CL 106 05/26/2023   CREATININE 0.81 05/26/2023   BUN 18 05/26/2023   CO2 31 05/26/2023   TSH 1.52 01/26/2023   INR 2.8 03/28/2018   HGBA1C 6.4 05/26/2023   MICROALBUR <0.7 01/26/2023   Assessment/Plan:  Mia [name] is a 68 y.o. Black or African American [2] female with  has a past medical history of Allergic rhinitis, cause unspecified (04/29/2011), Anemia, Anemia, unspecified (04/23/2011), Back pain, Bilateral swelling of feet and ankles, Chronic anticoagulation, Chronic headaches, Colon polyps (05/2009), Complication of anesthesia, Constipation, Diverticulosis, DVT (deep venous thrombosis) (HCC), GERD (gastroesophageal reflux disease) (11/10/2011), Gout (04/22/2010), H/O blood clots, H/O: GI bleed, Hyperlipidemia, Hyperplastic colon polyp, Hypertension, IBS (irritable bowel syndrome), Infected prosthetic knee joint (HCC), Joint pain,  Migraine (04/29/2011), Morbid obesity (HCC), Nonsmoker, Other fatigue, Pre-diabetes, Pulmonary embolism (HCC) (2010), Shortness of breath, SOBOE (shortness of breath on exertion), Vitamin D  deficiency, and WPW (Wolff-Parkinson-White syndrome).  HTN (hypertension) BP Readings from Last 3 Encounters:  05/27/23 136/80  05/26/23 122/78  01/26/23 130/84   Stable, pt to continue medical treatment coreg  6.25 bid, losartan  100 qd   Vitamin D  deficiency Last vitamin D  Lab Results  Component Value Date   VD25OH 39.39 01/26/2023   Low, to start  oral replacement   Lower back pain Chronic stable overall  Type 2 diabetes mellitus with hyperglycemia, without long-term current use of insulin (HCC) Lab Results  Component Value Date   HGBA1C 6.4 05/26/2023   New onset with obesity, pt declines mounajro with risk of worsening constipation   Bursitis of left hip Ok for otc topical voltaren  gel  prn, also lazy boy lift chair rx  B12 deficiency Lab Results  Component Value Date   VITAMINB12 295 01/26/2023   Low, to start oral replacement - b12 1000 mcg qd  Followup: Return in about 4 months (around 09/26/2023).  Mia Colonel, MD 05/27/2023 8:09 PM [facility]

## 2023-05-26 NOTE — Patient Instructions (Signed)
 You may want to bring up the idea of Trulance for the constipation  You are given the order for the Lazy Boy Lift Chair  Please continue all other medications as before, including the Voltaren  gel for the left hip  Please take OTC Vitamin D3 at 2000 units per day, indefinitely  Please also take a  multivitamin with B12  - 1 per day  Please have the pharmacy call with any other refills you may need.  Please keep your appointments with your specialists as you may have planned  Please call if you would want to try the Mounjaro for weight loss and sugar  Please go to the LAB at the blood drawing area for the tests to be done  You will be contacted by phone if any changes need to be made immediately.  Otherwise, you will receive a letter about your results with an explanation, but please check with MyChart first.  Please make an Appointment to return in 4 months, or sooner if needed

## 2023-05-26 NOTE — Progress Notes (Signed)
 The test results show that your current treatment is OK, as the tests are stable.  Please continue the same plan.  There is no other need for change of treatment or further evaluation based on these results, at this time.  thanks

## 2023-05-27 ENCOUNTER — Ambulatory Visit (INDEPENDENT_AMBULATORY_CARE_PROVIDER_SITE_OTHER): Admitting: Gastroenterology

## 2023-05-27 ENCOUNTER — Encounter: Payer: Self-pay | Admitting: Gastroenterology

## 2023-05-27 ENCOUNTER — Telehealth: Payer: Self-pay

## 2023-05-27 ENCOUNTER — Encounter: Payer: Self-pay | Admitting: Internal Medicine

## 2023-05-27 VITALS — BP 136/80 | HR 69 | Ht 63.0 in | Wt 343.0 lb

## 2023-05-27 DIAGNOSIS — K219 Gastro-esophageal reflux disease without esophagitis: Secondary | ICD-10-CM | POA: Diagnosis not present

## 2023-05-27 DIAGNOSIS — R1310 Dysphagia, unspecified: Secondary | ICD-10-CM | POA: Diagnosis not present

## 2023-05-27 DIAGNOSIS — K5904 Chronic idiopathic constipation: Secondary | ICD-10-CM | POA: Diagnosis not present

## 2023-05-27 DIAGNOSIS — M7072 Other bursitis of hip, left hip: Secondary | ICD-10-CM | POA: Insufficient documentation

## 2023-05-27 DIAGNOSIS — R1319 Other dysphagia: Secondary | ICD-10-CM

## 2023-05-27 MED ORDER — NA SULFATE-K SULFATE-MG SULF 17.5-3.13-1.6 GM/177ML PO SOLN: 1.0000 | Freq: Once | ORAL | 0 refills | Status: AC

## 2023-05-27 MED ORDER — PANTOPRAZOLE SODIUM 40 MG PO TBEC: 40.0000 mg | DELAYED_RELEASE_TABLET | Freq: Two times a day (BID) | ORAL | 3 refills | Status: AC

## 2023-05-27 NOTE — Assessment & Plan Note (Signed)
 BP Readings from Last 3 Encounters:  05/27/23 136/80  05/26/23 122/78  01/26/23 130/84   Stable, pt to continue medical treatment coreg  6.25 bid, losartan  100 qd

## 2023-05-27 NOTE — Assessment & Plan Note (Signed)
 Last vitamin D Lab Results  Component Value Date   VD25OH 39.39 01/26/2023   Low, to start oral replacement

## 2023-05-27 NOTE — Progress Notes (Signed)
 Agree with assessment/plan.  Edman Circle, MD [facility] [phone]

## 2023-05-27 NOTE — Assessment & Plan Note (Signed)
 Lab Results  Component Value Date   VITAMINB12 295 01/26/2023   Low, to start oral replacement - b12 1000 mcg qd

## 2023-05-27 NOTE — Assessment & Plan Note (Signed)
 Chronic stable overall

## 2023-05-27 NOTE — Assessment & Plan Note (Signed)
 Lab Results  Component Value Date   HGBA1C 6.4 05/26/2023   New onset with obesity, pt declines mounajro with risk of worsening constipation

## 2023-05-27 NOTE — Patient Instructions (Addendum)
 Recommend GERD diet, no late meals 3-4 hours before lying down Increase Pantoprazole  40 mg from once daily to twice daily Samples of Trulance 3 mg po daily- take 1 tablet daily  You have been scheduled for an endoscopy and colonoscopy. Please follow the written instructions given to you at your visit today.  If you use inhalers (even only as needed), please bring them with you on the day of your procedure.  DO NOT TAKE 7 DAYS PRIOR TO TEST- Trulicity (dulaglutide) Ozempic , Wegovy (semaglutide ) Mounjaro (tirzepatide) Bydureon Bcise (exanatide extended release)  DO NOT TAKE 1 DAY PRIOR TO YOUR TEST Rybelsus (semaglutide ) Adlyxin (lixisenatide) Victoza (liraglutide) Byetta (exanatide) ___________________________________________________________________________  Due to recent changes in healthcare laws, you may see the results of your imaging and laboratory studies on MyChart before your provider has had a chance to review them.  We understand that in some cases there may be results that are confusing or concerning to you. Not all laboratory results come back in the same time frame and the provider may be waiting for multiple results in order to interpret others.  Please give us  48 hours in order for your provider to thoroughly review all the results before contacting the office for clarification of your results.   _______________________________________________________  If your blood pressure at your visit was 140/90 or greater, please contact your primary care physician to follow up on this.  _______________________________________________________  If you are age 70 or older, your body mass index should be between 23-30. Your Body mass index is 60.76 kg/m. If this is out of the aforementioned range listed, please consider follow up with your Primary Care Provider.  If you are age 35 or younger, your body mass index should be between 19-25. Your Body mass index is 60.76 kg/m. If this is  out of the aformentioned range listed, please consider follow up with your Primary Care Provider.   ________________________________________________________  The [facility] providers would like to encourage you to use MYCHART to communicate with providers for non-urgent requests or questions.  Due to long hold times on the telephone, sending your provider a message by [facility] may be a faster and more efficient way to get a response.  Please allow 48 business hours for a response.  Please remember that this is for non-urgent requests.  _______________________________________________________ Thank you for trusting me with your gastrointestinal care. Deanna May, RNP

## 2023-05-27 NOTE — Progress Notes (Signed)
 Chief Complaint: GERD, constipation Primary GI Doctor: (previously Dr. Howard Macho) Dr. Venice Gillis  HPI:  Patient is a  68  year old female patient with past medical history of GERD, DVT, PE (on Xarelto ), chronic constipation, hypertension hyperlipidemia occasional pattern of WPW,  who was referred to me by Roslyn Coombe, MD on 01/26/23 for a complaint of GERD, constipation.    Last seen by cardiology 04/2022- WPW pattern on ECG intermittently, mild. Asymptomatic.  In the past have been avoiding beta-blockers.  Reviewed note. Due to see them next month.  Interval History    Patient presents for complaints of uncontrolled GERD and chronic constipation. Patient has history of GERD and currently taking Pantoprazole  40mg  po once daily. She states she has daily breakthrough symptoms of pyrosis and regurgitation and takes OTC Tums every day in between meals. She states she will eat food at times and it doesn't go down and comes right back up.    Patient also has chronic idiopathic constipation and has been taking multiple doses of OTC Miralax  without having bowel movement. Patient has bowel movement every 2-3 days with only small balls. She will have abdominal pain with lower back pain when constipated. No blood in stool. Reports taking Linzess  and it causing severe diarrhea. Apples have helped. She has chronic nausea every time she eats. She states she only has one meal per day in the evening.   Patient on Xarelto  po daily by Dr. Dorothye Gathers   Patients last EGD was approximately 8 years ago with Dr. Howard Macho , do not have report.   Wt Readings from Last 3 Encounters:  05/27/23 (!) 343 lb (155.6 kg)  05/26/23 (!) 343 lb (155.6 kg)  01/26/23 (!) 335 lb 3.2 oz (152 kg)    Past Medical History:  Diagnosis Date   Allergic rhinitis, cause unspecified 04/29/2011   Anemia    Anemia, unspecified 04/23/2011   Back pain    Bilateral swelling of feet and ankles    Chronic anticoagulation    Chronic headaches     Colon polyps 05/2009   Complication of anesthesia    hard to wake up   Constipation    Diverticulosis    DVT (deep venous thrombosis) (HCC)    right upper extremitiy   GERD (gastroesophageal reflux disease) 11/10/2011   Gout 04/22/2010   no current problems per patient on 12/16/21   H/O blood clots    H/O: GI bleed    Hyperlipidemia    Hyperplastic colon polyp    Hypertension    IBS (irritable bowel syndrome)    Infected prosthetic knee joint (HCC)    Joint pain    Migraine 04/29/2011   otc meds prn   Morbid obesity (HCC)    Nonsmoker    Other fatigue    Pre-diabetes    no meds   Pulmonary embolism (HCC) 2010   following right knee replacement   Shortness of breath    with exertion   SOBOE (shortness of breath on exertion)    Vitamin D  deficiency    WPW (Wolff-Parkinson-White syndrome)     Past Surgical History:  Procedure Laterality Date   CHOLECYSTECTOMY     open surgery with abd incision   COLONOSCOPY     x 7   DIAGNOSTIC LAPAROSCOPY Left    knee   DIAGNOSTIC LAPAROSCOPY Right    right wrist surgery for cyst   KNEE ARTHROSCOPY Left    MULTIPLE TOOTH EXTRACTIONS     all upper  teeth extracted - upper partial   RADIOLOGY WITH ANESTHESIA N/A 04/08/2022   Procedure: CT ABDOMEN WITH AND WITHOUT CONTRAST;  Surgeon: Radiologist, Medication, MD;  Location: [facility] OR;  Service: Radiology;  Laterality: N/A;   TOTAL KNEE ARTHROPLASTY Right 01/05/2008   followed by I & D for infection  x 2   TUBAL LIGATION     WISDOM TOOTH EXTRACTION      Current Outpatient Medications  Medication Sig Dispense Refill   acetaminophen  (TYLENOL ) 650 MG CR tablet Take 1,300 mg by mouth at bedtime.     acetic acid -hydrocortisone  (VOSOL -HC) OTIC solution Place 3 drops into both ears 2 (two) times daily. 10 mL 0   atorvastatin  (LIPITOR) 80 MG tablet Take 1 tablet (80 mg total) by mouth daily. 90 tablet 3   carvedilol  (COREG ) 6.25 MG tablet TAKE 1 TABLET(6.25 MG) BY MOUTH TWICE DAILY 180 tablet 3    Cholecalciferol (VITAMIN D3) 50 MCG (2000 UT) CAPS Take 4,000 Units by mouth daily.     ciclopirox  (PENLAC ) 8 % solution Apply topically at bedtime. Apply over nail and surrounding skin. Apply daily over previous coat. After seven (7) days, Annebelle Bostic remove with alcohol and continue cycle. 6.6 mL 11   diclofenac  Sodium (VOLTAREN ) 1 % GEL Apply 1 Application topically.     losartan  (COZAAR ) 100 MG tablet Take 1 tablet (100 mg total) by mouth daily. 90 tablet 3   Na Sulfate-K Sulfate-Mg Sulfate concentrate (SUPREP) 17.5-3.13-1.6 GM/177ML SOLN Take 1 kit (354 mLs total) by mouth once for 1 dose. 354 mL 0   polyethylene glycol powder (GLYCOLAX /MIRALAX ) 17 GM/SCOOP powder Take twice daily ( 17gr) until stooling regular, then once daily 3350 g 1   predniSONE  (STERAPRED UNI-PAK 21 TAB) 10 MG (21) TBPK tablet Take by mouth daily. Take as directed. 21 tablet 0   spironolactone  (ALDACTONE ) 25 MG tablet TAKE 1 TABLET(25 MG) BY MOUTH DAILY 90 tablet 3   sucralfate  (CARAFATE ) 1 g tablet Take 1 tablet (1 g total) by mouth 4 (four) times daily -  with meals and at bedtime. 120 tablet 0   XARELTO  20 MG TABS tablet TAKE 1 TABLET BY MOUTH DAILY WITH SUPPER 90 tablet 1   bisacodyl (DULCOLAX) 5 MG EC tablet Take 15 mg by mouth daily as needed for moderate constipation. (Patient not taking: Reported on 05/27/2023)     chlorhexidine  (HIBICLENS ) 4 % external liquid Apply topically daily as needed (to underarms, skin folds). (Patient not taking: Reported on 05/27/2023) 236 mL 1   HYDROcodone -acetaminophen  (NORCO/VICODIN) 5-325 MG tablet Take 1 tablet by mouth every 6 (six) hours as needed for moderate pain (pain score 4-6) or severe pain (pain score 7-10). (Patient not taking: Reported on 05/27/2023) 8 tablet 0   meclizine  (ANTIVERT ) 12.5 MG tablet Take 1 tablet (12.5 mg total) by mouth 3 (three) times daily as needed for dizziness. (Patient not taking: Reported on 05/27/2023) 40 tablet 1   ondansetron  (ZOFRAN -ODT) 4 MG  disintegrating tablet Take 1 tablet (4 mg total) by mouth every 8 (eight) hours as needed for nausea or vomiting. (Patient not taking: Reported on 05/27/2023) 30 tablet 0   pantoprazole  (PROTONIX ) 40 MG tablet Take 1 tablet (40 mg total) by mouth 2 (two) times daily before a meal. 180 tablet 3   No current facility-administered medications for this visit.    Allergies as of 05/27/2023 - Review Complete 05/27/2023  Allergen Reaction Noted   Crestor [rosuvastatin calcium ] Other (See Comments) 03/27/2010   Amlodipine Other (See Comments)  03/27/2010   Ultram  [tramadol ] Nausea Only 12/15/2012   Zosyn [piperacillin sod-tazobactam so] Other (See Comments) 02/16/2010    Family History  Problem Relation Age of Onset   Heart disease Mother    Heart Problems Mother    Diabetes Father    Throat cancer Father    Pancreatic cancer Sister    Hypertension Sister    Hypertension Sister    Hypertension Sister    Hypertension Brother    Colon cancer Other    Coronary artery disease Other    Heart disease Other     Review of Systems:    Constitutional: No weight loss, fever, chills, weakness or fatigue HEENT: Eyes: No change in vision               Ears, Nose, Throat:  No change in hearing or congestion Skin: No rash or itching Cardiovascular: No chest pain, chest pressure or palpitations   Respiratory: No SOB or cough Gastrointestinal: See HPI and otherwise negative Genitourinary: No dysuria or change in urinary frequency Neurological: No headache, dizziness or syncope Musculoskeletal: No new muscle or joint pain Hematologic: No bleeding or bruising Psychiatric: No history of depression or anxiety    Physical Exam:  Vital signs: BP 136/80   Pulse 69   Ht 5\' 3"  (1.6 m)   Wt (!) 343 lb (155.6 kg)   LMP 04/10/2010   BMI 60.76 kg/m   Constitutional:   Pleasant female appears to be in NAD, Well developed, Well nourished, alert and cooperative Throat: Oral cavity and pharynx without  inflammation, swelling or lesion.  Respiratory: Respirations even and unlabored. Lungs clear to auscultation bilaterally.   No wheezes, crackles, or rhonchi.  Cardiovascular: Normal S1, S2. Regular rate and rhythm. No peripheral edema, cyanosis or pallor.  Gastrointestinal:  Soft, nondistended, obese, non tender. No rebound or guarding. Hypoactive bowel sounds. No appreciable masses or hepatomegaly. Rectal:  Not performed.  Msk:  Symmetrical without gross deformities. Without edema, no deformity or joint abnormality.  Neurologic:  Alert and  oriented x4;  grossly normal neurologically.  Skin:   Dry and intact without significant lesions or rashes. Psychiatric: Oriented to person, place and time. Demonstrates good judgement and reason without abnormal affect or behaviors.  RELEVANT LABS AND IMAGING: CBC    Latest Ref Rng & Units 01/26/2023    2:07 PM 12/24/2022   12:18 PM 08/05/2022    1:58 PM  CBC  WBC 4.0 - 10.5 K/uL 11.0  11.1  7.4   Hemoglobin 12.0 - 15.0 g/dL 62.1  30.8  65.7   Hematocrit 36.0 - 46.0 % 44.5  44.8  43.1   Platelets 150.0 - 400.0 K/uL 267.0  253.0  210.0      CMP     Latest Ref Rng & Units 05/26/2023    2:22 PM 01/26/2023    2:07 PM 12/24/2022   12:18 PM  CMP  Glucose 70 - 99 mg/dL 89  99  846   BUN 6 - 23 mg/dL 18  23  26    Creatinine 0.40 - 1.20 mg/dL 9.62  9.52  8.41   Sodium 135 - 145 mEq/L 142  140  138   Potassium 3.5 - 5.1 mEq/L 4.1  4.7  4.8 Hemolysis seen...  C  Chloride 96 - 112 mEq/L 106  106  103   CO2 19 - 32 mEq/L 31  31  28    Calcium  8.4 - 10.5 mg/dL 32.4  40.1  02.7   Total Protein  6.0 - 8.3 g/dL 7.1  6.9  7.7   Total Bilirubin 0.2 - 1.2 mg/dL 0.9  0.8  0.8   Alkaline Phos 39 - 117 U/L 90  79  92   AST 0 - 37 U/L 14  12  17    ALT 0 - 35 U/L 11  11  14      C Corrected result     Lab Results  Component Value Date   TSH 1.52 01/26/2023  Imaging: 04/08/22 CT ABD with or without contrast IMPRESSION: Bosniak 1 left-sided 2.1 cm cyst. No  specific imaging follow up. This corresponds to the finding by ultrasound. Two punctate nonobstructing lower pole right-sided renal stones. Bilateral lower lobe parenchymal opacities. Atelectasis is favored but this could also be an infiltrative based on imaging. Please correlate with symptomatology and follow-up is recommended.   01/10/10 echo- The estimated ejection fraction was in the range of 55% to 60%.   Procedures: 05/05/2009 colonoscopy with Dr. Hoyt Macleod, recall 10 years Small sessile polyp in cecum  Mild diverticulosis in sigmoid to descending colon Otherwise normal examination  Assessment: Encounter Diagnoses  Name Primary?   Gastroesophageal reflux disease, unspecified whether esophagitis present Yes   Esophageal dysphagia    Chronic idiopathic constipation      68 year old African-American female patient who presents for uncontrolled GERD taking pantoprazole  40 mg p.o. daily.  Patient will have food get caught mid chest that she regurgitates back up.  I will increase patient's pantoprazole  to twice daily and will have her eat something small in the morning after taking her first dose.  I will also order upper GI endoscopy with possible dilatation in hospital with Dr. Venice Gillis.     For patient's constipation she has failed over-the-counter MiraLAX  and the Linzess  pro secretory agent gives patient diarrhea therefore I will give her samples of Trulance 3 mg p.o. daily to see if this works better for her.    Patient is also due for colon screening colonoscopy with 2-day prep in hospital with Dr. Venice Gillis.  Plan: -Increase pantoprazole  40 mg po daily to twice daily -Samples of Trulance 3 mg po daily provided  -Schedule EGD with possible dilatation at hospital due to BMI with Dr.Gupta.The risks and benefits of EGD with possible biopsies and esophageal dilation were discussed with the patient who agrees to proceed. -Schedule for a colonoscopy with 2 day prep at hospital due to BMI with  Dr. Venice Gillis. The risks and benefits of colonoscopy with possible polypectomy / biopsies were discussed and the patient agrees to proceed.  -Cardiac Clearance for Xarelto  Hold Xarelto   2 days before procedure - will instruct when and how to resume after procedure. Risks and benefits of procedure including bleeding, perforation, infection, missed lesions, medication reactions and possible hospitalization or surgery if complications occur explained. Additional rare but real risk of cardiovascular event such as heart attack or ischemia/infarct of other organs off Xarelto  explained and need to seek urgent help if this occurs. Will communicate by phone or EMR with patient's prescribing provider that to confirm holding Xarelto  is reasonable in this case.     Thank you for the courtesy of this consult. Please call me with any questions or concerns.   Keddrick Wyne, FNP-C [facility] 05/27/2023, 4:35 PM  Cc: Roslyn Coombe, MD

## 2023-05-27 NOTE — Assessment & Plan Note (Signed)
 Ok for otc topical voltaren  gel prn, also lazy boy lift chair rx

## 2023-05-27 NOTE — Telephone Encounter (Signed)
 [facility] Pre-operative Risk Assessment     Request for surgical clearance:     Endoscopy Procedure  What type of surgery is being performed?     Endoscopy and Colonoscopy  When is this surgery scheduled?     07/11/23  What type of clearance is required ?   Pharmacy  Are there any medications that need to be held prior to surgery and how long? Xarelto  and 2 days  Practice name and name of physician performing surgery?      [facility]  What is your office phone and fax number?      Phone- [phone]  Fax- [phone]  Anesthesia type (None, local, MAC, general) ?       MAC   Please route your response to Gaynell Keeler, CMA

## 2023-05-27 NOTE — Telephone Encounter (Signed)
   Patient Name: Mia [name]  DOB: 1955-06-21 MRN: [id number]  Primary Cardiologist: Dorothye Gathers, MD  Chart reviewed as part of pre-operative protocol coverage.   Patient is on Xarelto  for history of PE/DVT and is currently not managed by cardiology.   Francene Ing, Retha Cast, NP 05/27/2023, 4:07 PM

## 2023-05-31 ENCOUNTER — Telehealth: Payer: Self-pay

## 2023-05-31 NOTE — Telephone Encounter (Signed)
  LAURALIE BLACKSHER [birth year] [id number]  @DATE @   Dear Roslyn Coombe, MD:  We have scheduled the above named patient for a(n) colonoscopy procedure. Our records show that (s)he is on anticoagulation therapy.  Please advise as to whether the patient may come off their therapy of Xarelto  2 days prior to their procedure which is scheduled for 07/11/23.  Please route your response to [employer] or fax response to [phone].  Sincerely,     Gastroenterology

## 2023-05-31 NOTE — Telephone Encounter (Signed)
 Yes, pt is cleared to hold the Xarelto  for the 2 days prior to the procedure

## 2023-06-01 ENCOUNTER — Ambulatory Visit: Payer: Medicare PPO | Attending: Cardiology | Admitting: Cardiology

## 2023-06-01 ENCOUNTER — Encounter: Payer: Self-pay | Admitting: Cardiology

## 2023-06-01 VITALS — BP 136/75 | HR 87 | Ht 63.0 in | Wt 343.0 lb

## 2023-06-01 DIAGNOSIS — I10 Essential (primary) hypertension: Secondary | ICD-10-CM | POA: Diagnosis not present

## 2023-06-01 DIAGNOSIS — Z01818 Encounter for other preprocedural examination: Secondary | ICD-10-CM

## 2023-06-01 DIAGNOSIS — R002 Palpitations: Secondary | ICD-10-CM | POA: Diagnosis not present

## 2023-06-01 DIAGNOSIS — Z86718 Personal history of other venous thrombosis and embolism: Secondary | ICD-10-CM

## 2023-06-01 NOTE — Patient Instructions (Signed)

## 2023-06-01 NOTE — Progress Notes (Signed)
 Cardiology Office Note:  .   Date:  06/01/2023  ID:  Mia [name], DOB 06-23-1955, MRN [id number] PCP: Roslyn Coombe, MD  [facility] Providers Cardiologist:  Dorothye Gathers, MD     History of Present Illness: .   Mia [name] is a 68 y.o. female Discussed the use of AI scribe software for clinical note transcription with the patient, who gave verbal consent to proceed.  History of Present Illness Mia [name] "Mia [name]" is a 68 year old female with a history of DVT, PE, morbid obesity, hypertension, hyperlipidemia, IBS, and a right knee prosthesis who presents for follow-up.  She is on long-term anticoagulation therapy for her history of DVT and PE. She was previously on Coumadin  but has been switched to Xarelto , which is more convenient as it reduces the need for frequent monitoring. Previously, she was required to visit the clinic two to three times a week while on warfarin.  She struggles with weight management due to morbid obesity and ongoing gastrointestinal issues. Despite efforts, including eating one meal a day, she has difficulty losing weight. She lost 20 pounds but regained it within a few weeks due to severe constipation. She has tried various treatments for constipation, including a new medication that caused excessive bowel movements, leading her to discontinue it. She was also taken off Glimzest due to its side effects, which confined her to her home.  She has a history of hypertension and hyperlipidemia. Her last echocardiogram in 2012 showed an ejection fraction of 60%.  She has a history of IBS and reports severe constipation, which has been somewhat alleviated by dietary changes. She is scheduled for an esophageal procedure on July 7th due to episodes of vomiting and food not digesting properly.  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 She reports ongoing issues with the knee, including pain and a sensation that the infection might  be returning. She recalls a fall last year when her hip locked, resulting in a bruise and requiring paramedic assistance. She attributes the fall to her knee issues, as she had a cyst removed from the back of the knee years ago.  She mentions a recent incident where she burned her chest while cooking, which was treated by a nurse at the clinic.     No CP, no SOB  Studies Reviewed: Aaron Aas    EKG Interpretation Date/Time:  Wednesday Jun 01 2023 16:00:39 EDT Ventricular Rate:  87 PR Interval:  156 QRS Duration:  82 QT Interval:  350 QTC Calculation: 421 R Axis:   17  Text Interpretation: Normal sinus rhythm Nonspecific T wave abnormality When compared with ECG of 27-Oct-2020 11:26, Criteria for Anterior infarct are no longer Present Non-specific ST-t changes Confirmed by Dorothye Gathers ([id number]) on 06/01/2023 4:21:14 PM    Results DIAGNOSTIC Echocardiogram: EF 60% (2012) EKG: Occasional pattern of WPW  Risk Assessment/Calculations:            Physical Exam:   VS:  BP 136/75   Pulse 87   Ht 5\' 3"  (1.6 m)   Wt (!) 343 lb (155.6 kg)   LMP 04/10/2010   SpO2 92%   BMI 60.76 kg/m    Wt Readings from Last 3 Encounters:  06/01/23 (!) 343 lb (155.6 kg)  05/27/23 (!) 343 lb (155.6 kg)  05/26/23 (!) 343 lb (155.6 kg)    GEN: Well nourished, well developed in no acute distress NECK: No JVD; No carotid bruits CARDIAC: RRR, no murmurs,  no rubs, no gallops RESPIRATORY:  Clear to auscultation without rales, wheezing or rhonchi  ABDOMEN: Soft, non-tender, non-distended EXTREMITIES:  Chronic edema; No deformity   ASSESSMENT AND PLAN: .    Assessment and Plan Assessment & Plan  Preop cardiac risk assessment She may proceed with endoscopy.  Hold Xarelto  for 2 days prior.  She is of low to moderate risk from a cardiac perspective.  Deep venous thrombosis and pulmonary embolism Chronic management with Xarelto  is effective, reducing the need for frequent INR monitoring compared to previous  warfarin therapy, thus improving her quality of life. - Continue Xarelto  for anticoagulation management. - Hold Xarelto  for two days prior to upcoming esophageal procedure.  Gastroesophageal reflux disease (GERD) Symptoms include vomiting and difficulty with food digestion. An esophageal procedure is scheduled due to her claustrophobia preventing in-office evaluations. - Proceed with esophageal procedure on July 7th.  Severe constipation Severe constipation impacts weight management and overall well-being. Previous treatments caused excessive bowel movements and discomfort, necessitating careful management of gastrointestinal symptoms.  Morbid obesity Difficulty with weight management is exacerbated by gastrointestinal issues and limited mobility. Recent weight loss was regained due to severe constipation and gastrointestinal symptoms. Mobility is further limited by knee issues and past hip injury.  Primary hypertension Blood pressure control is likely managed with current medications.  Losartan  100 mg a day spironolactone  25 mg a day carvedilol  6.25 mg twice a day  Hyperlipidemia Managed with atorvastatin .  Wolff-Parkinson-White syndrome Occasional pattern seen on EKG with no acute issues reported.         Dispo: 1 yr  Signed, Dorothye Gathers, MD

## 2023-06-07 ENCOUNTER — Telehealth: Payer: Self-pay

## 2023-06-07 ENCOUNTER — Other Ambulatory Visit (HOSPITAL_COMMUNITY): Payer: Self-pay

## 2023-06-07 NOTE — Telephone Encounter (Signed)
 Pharmacy Patient Advocate Encounter   Received notification from CoverMyMeds that prior authorization for XARELTO  is required/requested.   Insurance verification completed.   The patient is insured through [employer] .   Per test claim: The current 30 day co-pay is, $380.25.  No PA needed at this time. This test claim was processed through [facility]- copay amounts may vary at other pharmacies due to pharmacy/plan contracts, or as the patient moves through the different stages of their insurance plan.

## 2023-06-08 ENCOUNTER — Telehealth: Payer: Self-pay | Admitting: Cardiology

## 2023-06-08 DIAGNOSIS — Z86718 Personal history of other venous thrombosis and embolism: Secondary | ICD-10-CM

## 2023-06-08 MED ORDER — RIVAROXABAN 20 MG PO TABS: 20.0000 mg | ORAL_TABLET | Freq: Every day | ORAL | 0 refills | Status: DC

## 2023-06-08 MED ORDER — RIVAROXABAN 20 MG PO TABS: 20.0000 mg | ORAL_TABLET | Freq: Every day | ORAL | 1 refills | Status: DC

## 2023-06-08 NOTE — Telephone Encounter (Signed)
*  STAT* If patient is at the pharmacy, call can be transferred to refill team.   1. Which medications need to be refilled? (please list name of each medication and dose if known)   XARELTO  20 MG TABS tablet   TAKE 1 TABLET BY MOUTH DAILY WITH SUPPER    2. Would you like to learn more about the convenience, safety, & potential cost savings by using the [facility]? No   3. Are you open to using the [facility] No   4. Which pharmacy/location (including street and city if local pharmacy) is medication to be sent to? [id number] - [place], [place] - [address] AT [facility] OF [address]     5. Do they need a 30 day or 90 day supply? 90 Day Supply

## 2023-06-08 NOTE — Telephone Encounter (Signed)
 Medication name/dosage: Samples List: Xarelto  20 mg  Administration instructions:   Reason for samples: Reason for samples: unable to afford medication  Ordering provider: Dr. Renna Cary  *Once above information entered, route the phone encounter to [facility] DIV MAG [facility] SAMPLES and send Teams message to team member assigned to Samples for the day.

## 2023-06-08 NOTE — Telephone Encounter (Signed)
 Prescription refill request for Xarelto  received.  Indication: DVT Last office visit: 06/01/23  Vertell Gory MD Weight: 155.6kg Age: 68 Scr: 0.81 on 05/26/23  Epic CrCl: 170.09  Based on above findings Xarelto  20mg  daily is the appropriate dose.   Refill approved.

## 2023-06-08 NOTE — Telephone Encounter (Signed)
 Patient calling the office for samples of medication:   1.  What medication and dosage are you requesting samples for?  XARELTO  20 MG TABS tablet  TAKE 1 TABLET BY MOUTH DAILY WITH SUPPER   2.  Are you currently out of this medication? Yes

## 2023-06-08 NOTE — Telephone Encounter (Signed)
 Xarelto  sample 20 mg by mouth daily for 14 days. It's ready for pick up from front desk.

## 2023-06-13 ENCOUNTER — Telehealth: Payer: Self-pay | Admitting: Pharmacy Technician

## 2023-06-13 ENCOUNTER — Other Ambulatory Visit (HOSPITAL_COMMUNITY): Payer: Self-pay

## 2023-06-13 NOTE — Telephone Encounter (Signed)
   Patient has [employer] as primary. For 90 days- 541.24   Secondary insurance is advance prescription for 90 days 141.00 or 47.00 for 30 days    I called [facility] and asked for them to fill this under her advance prescription insurance.    Its now 47.00 at [facility]. I called the patient to let her know. She said it was supposed to be paid through workers comp but they are now saying they wont pay so she is getting on her insurance and will deal with her workers comp later.

## 2023-06-22 ENCOUNTER — Telehealth: Payer: Self-pay | Admitting: Internal Medicine

## 2023-06-22 NOTE — Telephone Encounter (Signed)
 Patient dropped off document jury duty summons. SH ewould like a doctor's note excusing her from it. Patient requested to send it back via Call Patient to pick up within 7-days. Document is located in providers tray at front office.Please advise at Mobile [phone] (mobile)

## 2023-06-24 NOTE — Telephone Encounter (Signed)
Ok done hardcopy to cma 

## 2023-06-24 NOTE — Telephone Encounter (Signed)
 Pt has picked up her letter and her original copy of the jury summons

## 2023-06-24 NOTE — Telephone Encounter (Signed)
 Called and let Pt know her letter is ready for pick up.

## 2023-06-24 NOTE — Telephone Encounter (Signed)
I have not seen this paperwork.  

## 2023-06-27 ENCOUNTER — Encounter: Payer: Self-pay | Admitting: Internal Medicine

## 2023-06-27 ENCOUNTER — Ambulatory Visit (INDEPENDENT_AMBULATORY_CARE_PROVIDER_SITE_OTHER): Admitting: Internal Medicine

## 2023-06-27 VITALS — BP 126/74 | HR 70 | Temp 98.7°F | Ht 63.0 in | Wt 339.0 lb

## 2023-06-27 DIAGNOSIS — E559 Vitamin D deficiency, unspecified: Secondary | ICD-10-CM

## 2023-06-27 DIAGNOSIS — L02412 Cutaneous abscess of left axilla: Secondary | ICD-10-CM | POA: Diagnosis not present

## 2023-06-27 DIAGNOSIS — I10 Essential (primary) hypertension: Secondary | ICD-10-CM | POA: Diagnosis not present

## 2023-06-27 DIAGNOSIS — B351 Tinea unguium: Secondary | ICD-10-CM | POA: Diagnosis not present

## 2023-06-27 DIAGNOSIS — E538 Deficiency of other specified B group vitamins: Secondary | ICD-10-CM

## 2023-06-27 DIAGNOSIS — E1165 Type 2 diabetes mellitus with hyperglycemia: Secondary | ICD-10-CM

## 2023-06-27 MED ORDER — SULFAMETHOXAZOLE-TRIMETHOPRIM 800-160 MG PO TABS: 1.0000 | ORAL_TABLET | Freq: Two times a day (BID) | ORAL | 0 refills | Status: DC

## 2023-06-27 MED ORDER — TERBINAFINE HCL 250 MG PO TABS: 250.0000 mg | ORAL_TABLET | Freq: Every day | ORAL | 1 refills | Status: AC

## 2023-06-27 MED ORDER — LOSARTAN POTASSIUM 100 MG PO TABS: 100.0000 mg | ORAL_TABLET | Freq: Every day | ORAL | 3 refills | Status: AC

## 2023-06-27 MED ORDER — HYDROCODONE-ACETAMINOPHEN 5-325 MG PO TABS: 1.0000 | ORAL_TABLET | Freq: Four times a day (QID) | ORAL | 0 refills | Status: AC | PRN

## 2023-06-27 NOTE — Assessment & Plan Note (Signed)
 BP Readings from Last 3 Encounters:  06/27/23 126/74  06/01/23 136/75  05/27/23 136/80   Stable, pt to continue medical treatment coreg  6.25 bid, losartan  100 every day, aldactone  25 qd

## 2023-06-27 NOTE — Assessment & Plan Note (Signed)
 Last vitamin D Lab Results  Component Value Date   VD25OH 39.39 01/26/2023   Low, to start oral replacement

## 2023-06-27 NOTE — Patient Instructions (Signed)
 Please take all new medication as prescribed - the antibiotic, and pain medication as needed  Please take all new medication as prescribed - the lamisil pills for the toes  Please continue all other medications as before, and refills have been done if requested.  Please have the pharmacy call with any other refills you may need.  Please keep your appointments with your specialists as you may have planned  You will be contacted regarding the referral for: General Surgury asap  Please make an Appointment to return in 6 WEEKS, or sooner if needed, also with Lab Appointment for testing done 3-5 days before at the FIRST FLOOR Lab (so this is for TWO appointments - please see the scheduling desk as you leave)

## 2023-06-27 NOTE — Assessment & Plan Note (Signed)
 Mild to mod, for antibx course,septra  ds bid, refer general surgury, ,hydrocodone  5 325 qid prn,  to f/u any worsening symptoms or concerns

## 2023-06-27 NOTE — Assessment & Plan Note (Signed)
 Lab Results  Component Value Date   HGBA1C 6.4 05/26/2023   Stable, pt to continue current medical treatment - diet,,wt control

## 2023-06-27 NOTE — Assessment & Plan Note (Signed)
 Mild to mod, for antibx course lamisil 250 every day x 6 wks and f/u labs with ROV,  to f/u any worsening symptoms or concerns

## 2023-06-27 NOTE — Progress Notes (Signed)
 Patient ID: Mia [name], female   DOB: Sep 23, 1955, 68 y.o.   MRN: [id number]        Chief Complaint: follow up left axillary abscess, low vit d, dm, htn       HPI:  Mia [name] is a 68 y.o. female here with c/o 3 days onset left axillary red, tender swelling, has some slight drainage last pm and somewhat better this am, no high fever, chills.  Pt denies chest pain, increased sob or doe, wheezing, orthopnea, PND, increased LE swelling, palpitations, dizziness or syncope.   Pt denies polydipsia, polyuria, or new focal neuro s/s.   Does have persistent nail onychomycosis despite 3 mo topical laquer,  has hx of multiple staph infections.         Wt Readings from Last 3 Encounters:  06/27/23 (!) 339 lb (153.8 kg)  06/01/23 (!) 343 lb (155.6 kg)  05/27/23 (!) 343 lb (155.6 kg)   BP Readings from Last 3 Encounters:  06/27/23 126/74  06/01/23 136/75  05/27/23 136/80         Past Medical History:  Diagnosis Date   Allergic rhinitis, cause unspecified 04/29/2011   Anemia    Anemia, unspecified 04/23/2011   Back pain    Bilateral swelling of feet and ankles    Chronic anticoagulation    Chronic headaches    Colon polyps 05/2009   Complication of anesthesia    hard to wake up   Constipation    Diverticulosis    DVT (deep venous thrombosis) (HCC)    right upper extremitiy   GERD (gastroesophageal reflux disease) 11/10/2011   Gout 04/22/2010   no current problems per patient on 12/16/21   H/O blood clots    H/O: GI bleed    Hyperlipidemia    Hyperplastic colon polyp    Hypertension    IBS (irritable bowel syndrome)    Infected prosthetic knee joint (HCC)    Joint pain    Migraine 04/29/2011   otc meds prn   Morbid obesity (HCC)    Nonsmoker    Other fatigue    Pre-diabetes    no meds   Pulmonary embolism (HCC) 2010   following right knee replacement   Shortness of breath    with exertion   SOBOE (shortness of breath on exertion)    Vitamin D  deficiency    WPW  (Wolff-Parkinson-White syndrome)    Past Surgical History:  Procedure Laterality Date   CHOLECYSTECTOMY     open surgery with abd incision   COLONOSCOPY     x 7   DIAGNOSTIC LAPAROSCOPY Left    knee   DIAGNOSTIC LAPAROSCOPY Right    right wrist surgery for cyst   KNEE ARTHROSCOPY Left    MULTIPLE TOOTH EXTRACTIONS     all upper teeth extracted - upper partial   RADIOLOGY WITH ANESTHESIA N/A 04/08/2022   Procedure: CT ABDOMEN WITH AND WITHOUT CONTRAST;  Surgeon: Radiologist, Medication, MD;  Location: [facility] OR;  Service: Radiology;  Laterality: N/A;   TOTAL KNEE ARTHROPLASTY Right 01/05/2008   followed by [facility] for infection  x 2   TUBAL LIGATION     WISDOM TOOTH EXTRACTION      reports that she has never smoked. She has never used smokeless tobacco. She reports that she does not drink alcohol and does not use drugs. family history includes Colon cancer in an other family member; Coronary artery disease in an other family member; Diabetes in her father; Heart  Problems in her mother; Heart disease in her mother and another family member; Hypertension in her brother, sister, sister, and sister; Pancreatic cancer in her sister; Throat cancer in her father. Allergies  Allergen Reactions   Crestor [Rosuvastatin Calcium ] Other (See Comments)    Chest pain   Amlodipine Other (See Comments)    Edema, headache    Ultram  [Tramadol ] Nausea Only   Zosyn [Piperacillin Sod-Tazobactam So] Other (See Comments)    REACTION: UNSURE   Current Outpatient Medications on File Prior to Visit  Medication Sig Dispense Refill   acetaminophen  (TYLENOL ) 650 MG CR tablet Take 1,300 mg by mouth at bedtime.     acetic acid -hydrocortisone  (VOSOL -HC) OTIC solution Place 3 drops into both ears 2 (two) times daily. 10 mL 0   atorvastatin  (LIPITOR) 80 MG tablet Take 1 tablet (80 mg total) by mouth daily. 90 tablet 3   bisacodyl (DULCOLAX) 5 MG EC tablet Take 15 mg by mouth daily as needed for moderate constipation.  (Patient not taking: Reported on 05/27/2023)     carvedilol  (COREG ) 6.25 MG tablet TAKE 1 TABLET(6.25 MG) BY MOUTH TWICE DAILY 180 tablet 3   chlorhexidine  (HIBICLENS ) 4 % external liquid Apply topically daily as needed (to underarms, skin folds). (Patient not taking: Reported on 06/01/2023) 236 mL 1   Cholecalciferol (VITAMIN D3) 50 MCG (2000 UT) CAPS Take 4,000 Units by mouth daily.     ciclopirox  (PENLAC ) 8 % solution Apply topically at bedtime. Apply over nail and surrounding skin. Apply daily over previous coat. After seven (7) days, may remove with alcohol and continue cycle. 6.6 mL 11   diclofenac  Sodium (VOLTAREN ) 1 % GEL Apply 1 Application topically.     HYDROcodone -acetaminophen  (NORCO/VICODIN) 5-325 MG tablet Take 1 tablet by mouth every 6 (six) hours as needed for moderate pain (pain score 4-6) or severe pain (pain score 7-10). (Patient not taking: Reported on 06/01/2023) 8 tablet 0   meclizine  (ANTIVERT ) 12.5 MG tablet Take 1 tablet (12.5 mg total) by mouth 3 (three) times daily as needed for dizziness. (Patient not taking: Reported on 05/27/2023) 40 tablet 1   ondansetron  (ZOFRAN -ODT) 4 MG disintegrating tablet Take 1 tablet (4 mg total) by mouth every 8 (eight) hours as needed for nausea or vomiting. (Patient not taking: Reported on 06/01/2023) 30 tablet 0   pantoprazole  (PROTONIX ) 40 MG tablet Take 1 tablet (40 mg total) by mouth 2 (two) times daily before a meal. 180 tablet 3   polyethylene glycol powder (GLYCOLAX /MIRALAX ) 17 GM/SCOOP powder Take twice daily ( 17gr) until stooling regular, then once daily 3350 g 1   predniSONE  (STERAPRED UNI-PAK 21 TAB) 10 MG (21) TBPK tablet Take by mouth daily. Take as directed. 21 tablet 0   rivaroxaban  (XARELTO ) 20 MG TABS tablet Take 1 tablet (20 mg total) by mouth daily with supper. 90 tablet 1   rivaroxaban  (XARELTO ) 20 MG TABS tablet Take 1 tablet (20 mg total) by mouth daily with supper. 14 tablet 0   spironolactone  (ALDACTONE ) 25 MG tablet TAKE 1  TABLET(25 MG) BY MOUTH DAILY 90 tablet 3   sucralfate  (CARAFATE ) 1 g tablet Take 1 tablet (1 g total) by mouth 4 (four) times daily -  with meals and at bedtime. 120 tablet 0   No current facility-administered medications on file prior to visit.        ROS:  All others reviewed and negative.  Objective        PE:  BP 126/74 (BP Location: Right Arm,  Patient Position: Sitting, Cuff Size: Normal)   Pulse 70   Temp 98.7 F (37.1 C) (Oral)   Ht 5' 3 (1.6 m)   Wt (!) 339 lb (153.8 kg)   LMP 04/10/2010   SpO2 95%   BMI 60.05 kg/m                 Constitutional: Pt appears in NAD               HENT: Head: NCAT.                Right Ear: External ear normal.                 Left Ear: External ear normal.                Eyes: . Pupils are equal, round, and reactive to light. Conjunctivae and EOM are normal               Nose: without d/c or deformity               Neck: Neck supple. Gross normal ROM               Cardiovascular: Normal rate and regular rhythm.                 Pulmonary/Chest: Effort normal and breath sounds without rales or wheezing.                Abd:  Soft, NT, ND, + BS, no organomegaly               Neurological: Pt is alert. At baseline orientation, motor grossly intact               Skin: Skin is warm. LE edema - none, left axilla with 2 cm red, tender swelling               Psychiatric: Pt behavior is normal without agitation   Micro: none  Cardiac tracings I have personally interpreted today:  none  Pertinent Radiological findings (summarize): none   Lab Results  Component Value Date   WBC 11.0 (H) 01/26/2023   HGB 14.2 01/26/2023   HCT 44.5 01/26/2023   PLT 267.0 01/26/2023   GLUCOSE 89 05/26/2023   CHOL 158 05/26/2023   TRIG 80.0 05/26/2023   HDL 46.60 05/26/2023   LDLDIRECT 148.9 12/11/2012   LDLCALC 96 05/26/2023   ALT 11 05/26/2023   AST 14 05/26/2023   NA 142 05/26/2023   K 4.1 05/26/2023   CL 106 05/26/2023   CREATININE 0.81 05/26/2023    BUN 18 05/26/2023   CO2 31 05/26/2023   TSH 1.52 01/26/2023   INR 2.8 03/28/2018   HGBA1C 6.4 05/26/2023   MICROALBUR <0.7 01/26/2023   Assessment/Plan:  Mia [name] is a 68 y.o. Black or African American [2] female with  has a past medical history of Allergic rhinitis, cause unspecified (04/29/2011), Anemia, Anemia, unspecified (04/23/2011), Back pain, Bilateral swelling of feet and ankles, Chronic anticoagulation, Chronic headaches, Colon polyps (05/2009), Complication of anesthesia, Constipation, Diverticulosis, DVT (deep venous thrombosis) (HCC), GERD (gastroesophageal reflux disease) (11/10/2011), Gout (04/22/2010), H/O blood clots, H/O: GI bleed, Hyperlipidemia, Hyperplastic colon polyp, Hypertension, IBS (irritable bowel syndrome), Infected prosthetic knee joint (HCC), Joint pain, Migraine (04/29/2011), Morbid obesity (HCC), Nonsmoker, Other fatigue, Pre-diabetes, Pulmonary embolism (HCC) (2010), Shortness of breath, SOBOE (shortness of breath on exertion), Vitamin D  deficiency, and WPW (Wolff-Parkinson-White syndrome).  Vitamin D  deficiency  Last vitamin D  Lab Results  Component Value Date   VD25OH 39.39 01/26/2023   Low, to start  oral replacement   Type 2 diabetes mellitus with hyperglycemia, without long-term current use of insulin (HCC) Lab Results  Component Value Date   HGBA1C 6.4 05/26/2023   Stable, pt to continue current medical treatment - diet,,wt control   HTN (hypertension) BP Readings from Last 3 Encounters:  06/27/23 126/74  06/01/23 136/75  05/27/23 136/80   Stable, pt to continue medical treatment coreg  6.25 bid, losartan  100 every day, aldactone  25 qd   Abscess of left axilla Mild to mod, for antibx course,septra  ds bid, refer general surgury, ,hydrocodone  5 325 qid prn,  to f/u any worsening symptoms or concerns  Onychomycosis Mild to mod, for antibx course lamisil 250 every day x 6 wks and f/u labs with ROV,  to f/u any worsening symptoms  or concerns  Followup: Return in about 6 weeks (around 08/08/2023).  Lynwood Rush, MD 06/27/2023 7:39 PM [facility]

## 2023-07-01 DIAGNOSIS — L02412 Cutaneous abscess of left axilla: Secondary | ICD-10-CM | POA: Diagnosis not present

## 2023-07-04 ENCOUNTER — Encounter (HOSPITAL_COMMUNITY): Payer: Self-pay | Admitting: Gastroenterology

## 2023-07-04 ENCOUNTER — Telehealth: Payer: Self-pay

## 2023-07-04 NOTE — Telephone Encounter (Signed)
 Procedure:Colon Procedure date: 07/11/23 Procedure location: [facility] Arrival Time: 815am Spoke with the patient Y/N: yes Any prep concerns? n  Has the patient obtained the prep from the pharmacy ? y Do you have a care partner and transportation: y Any additional concerns? n

## 2023-07-11 ENCOUNTER — Ambulatory Visit (HOSPITAL_COMMUNITY): Admitting: Certified Registered"

## 2023-07-11 ENCOUNTER — Ambulatory Visit (HOSPITAL_COMMUNITY)
Admission: RE | Admit: 2023-07-11 | Discharge: 2023-07-11 | Disposition: A | Discharge status: Home / Self Care | Attending: Gastroenterology | Admitting: Gastroenterology

## 2023-07-11 ENCOUNTER — Other Ambulatory Visit: Payer: Self-pay

## 2023-07-11 ENCOUNTER — Ambulatory Visit (HOSPITAL_BASED_OUTPATIENT_CLINIC_OR_DEPARTMENT_OTHER): Admitting: Certified Registered"

## 2023-07-11 ENCOUNTER — Encounter (HOSPITAL_COMMUNITY)
Admission: RE | Disposition: A | Discharge status: Home / Self Care | Payer: Self-pay | Source: Home / Self Care | Attending: Gastroenterology

## 2023-07-11 DIAGNOSIS — K640 First degree hemorrhoids: Secondary | ICD-10-CM | POA: Insufficient documentation

## 2023-07-11 DIAGNOSIS — E119 Type 2 diabetes mellitus without complications: Secondary | ICD-10-CM | POA: Diagnosis not present

## 2023-07-11 DIAGNOSIS — Z86718 Personal history of other venous thrombosis and embolism: Secondary | ICD-10-CM | POA: Diagnosis not present

## 2023-07-11 DIAGNOSIS — K2950 Unspecified chronic gastritis without bleeding: Secondary | ICD-10-CM | POA: Diagnosis not present

## 2023-07-11 DIAGNOSIS — I10 Essential (primary) hypertension: Secondary | ICD-10-CM | POA: Diagnosis not present

## 2023-07-11 DIAGNOSIS — R1319 Other dysphagia: Secondary | ICD-10-CM

## 2023-07-11 DIAGNOSIS — E6689 Other obesity not elsewhere classified: Secondary | ICD-10-CM | POA: Insufficient documentation

## 2023-07-11 DIAGNOSIS — Z1211 Encounter for screening for malignant neoplasm of colon: Secondary | ICD-10-CM | POA: Diagnosis not present

## 2023-07-11 DIAGNOSIS — D123 Benign neoplasm of transverse colon: Secondary | ICD-10-CM

## 2023-07-11 DIAGNOSIS — K219 Gastro-esophageal reflux disease without esophagitis: Secondary | ICD-10-CM | POA: Insufficient documentation

## 2023-07-11 DIAGNOSIS — R1310 Dysphagia, unspecified: Secondary | ICD-10-CM | POA: Diagnosis not present

## 2023-07-11 DIAGNOSIS — Q399 Congenital malformation of esophagus, unspecified: Secondary | ICD-10-CM | POA: Insufficient documentation

## 2023-07-11 DIAGNOSIS — Z6841 Body Mass Index (BMI) 40.0 and over, adult: Secondary | ICD-10-CM | POA: Insufficient documentation

## 2023-07-11 DIAGNOSIS — K5904 Chronic idiopathic constipation: Secondary | ICD-10-CM

## 2023-07-11 DIAGNOSIS — K5730 Diverticulosis of large intestine without perforation or abscess without bleeding: Secondary | ICD-10-CM | POA: Insufficient documentation

## 2023-07-11 DIAGNOSIS — K2289 Other specified disease of esophagus: Secondary | ICD-10-CM | POA: Insufficient documentation

## 2023-07-11 DIAGNOSIS — D122 Benign neoplasm of ascending colon: Secondary | ICD-10-CM | POA: Diagnosis not present

## 2023-07-11 DIAGNOSIS — K2970 Gastritis, unspecified, without bleeding: Secondary | ICD-10-CM | POA: Diagnosis not present

## 2023-07-11 HISTORY — PX: COLONOSCOPY: SHX5424

## 2023-07-11 HISTORY — PX: ESOPHAGOGASTRODUODENOSCOPY: SHX5428

## 2023-07-11 HISTORY — PX: POLYPECTOMY: SHX149

## 2023-07-11 HISTORY — PX: BIOPSY OF SKIN SUBCUTANEOUS TISSUE AND/OR MUCOUS MEMBRANE: SHX6741

## 2023-07-11 HISTORY — PX: MALONEY DILATION: SHX5535

## 2023-07-11 LAB — GLUCOSE, CAPILLARY: Glucose-Capillary: 119 mg/dL — ABNORMAL HIGH (ref 70–99)

## 2023-07-11 SURGERY — COLONOSCOPY
Anesthesia: Monitor Anesthesia Care

## 2023-07-11 MED ORDER — PROPOFOL 1000 MG/100ML IV EMUL
INTRAVENOUS | Status: AC
  Filled 2023-07-11: qty 100

## 2023-07-11 MED ORDER — SODIUM CHLORIDE 0.9 % IV SOLN
INTRAVENOUS | Status: AC | PRN
  Administered 2023-07-11: 500 mL via INTRAMUSCULAR

## 2023-07-11 MED ORDER — PROPOFOL 10 MG/ML IV BOLUS
INTRAVENOUS | Status: AC
  Filled 2023-07-11: qty 20

## 2023-07-11 MED ORDER — PROPOFOL 500 MG/50ML IV EMUL
INTRAVENOUS | Status: DC | PRN
  Administered 2023-07-11: 40 mg via INTRAVENOUS
  Administered 2023-07-11: 200 ug/kg/min via INTRAVENOUS
  Administered 2023-07-11: 40 mg via INTRAVENOUS
  Administered 2023-07-11: 80 mg via INTRAVENOUS

## 2023-07-11 MED ORDER — SODIUM CHLORIDE 0.9 % IV SOLN: INTRAVENOUS | Status: DC

## 2023-07-11 MED ORDER — DEXMEDETOMIDINE HCL IN NACL 80 MCG/20ML IV SOLN
INTRAVENOUS | Status: DC | PRN
  Administered 2023-07-11: 8 ug via INTRAVENOUS

## 2023-07-11 MED ORDER — PHENYLEPHRINE HCL (PRESSORS) 10 MG/ML IV SOLN
INTRAVENOUS | Status: DC | PRN
  Administered 2023-07-11 (×4): 80 ug via INTRAVENOUS
  Administered 2023-07-11: 40 ug via INTRAVENOUS
  Administered 2023-07-11 (×2): 80 ug via INTRAVENOUS
  Administered 2023-07-11: 40 ug via INTRAVENOUS
  Administered 2023-07-11: 80 ug via INTRAVENOUS

## 2023-07-11 NOTE — Op Note (Signed)
 [facility] Patient Name: Mia [name] Procedure Date: 07/11/2023 MRN: [id number] Attending MD: Lynnie Bring , MD, [id number] Date of Birth: 16-May-1955 CSN: [id number] Age: 68 Admit Type: Outpatient Procedure:                Upper GI endoscopy Indications:              Dysphagia Providers:                Lynnie Bring, MD, Gregoria Pierce, RN, Curtistine Bishop, Technician Referring MD:              Medicines:                Monitored Anesthesia Care Complications:            No immediate complications. Estimated Blood Loss:     Estimated blood loss: none. Procedure:                Pre-Anesthesia Assessment:                           - Prior to the procedure, a History and Physical                            was performed, and patient medications and                            allergies were reviewed. The patient's tolerance of                            previous anesthesia was also reviewed. The risks                            and benefits of the procedure and the sedation                            options and risks were discussed with the patient.                            All questions were answered, and informed consent                            was obtained. Prior Anticoagulants: The patient has                            taken Xarelto  (rivaroxaban ), last dose was 2 days                            prior to procedure. ASA Grade Assessment: IV - A                            patient with severe systemic disease that is a  constant threat to life. After reviewing the risks                            and benefits, the patient was deemed in                            satisfactory condition to undergo the procedure.                           After obtaining informed consent, the endoscope was                            passed under direct vision. Throughout the                            procedure, the patient's  blood pressure, pulse, and                            oxygen saturations were monitored continuously. The                            [id number]-[id number] ([id number]) Olympus endoscope was introduced                            through the mouth, and advanced to the second part                            of duodenum. The upper GI endoscopy was                            accomplished without difficulty. The patient                            tolerated the procedure well. Scope In: Scope Out: Findings:      The examined esophagus was mildly tortuous. Biopsies were obtained from       the proximal and distal esophagus with cold forceps for histology of       suspected eosinophilic esophagitis. The scope was withdrawn. Dilation       was performed with a Maloney dilator with mild resistance at 50 Fr.      Localized mild inflammation characterized by congestion (edema) was       found in the gastric antrum. Biopsies were taken with a cold forceps for       histology. Prominent 1 cm antral fold s/p Bx.      The examined duodenum was normal. Impression:               - Mild presbyesophagus s/p esophageal dilatation                           - Gastritis. Biopsied.                           - Prominent antral fold (Bx)                           -  Biopsies were taken with a cold forceps to r/o                            eosinophilic esophagitis. Moderate Sedation:      Not Applicable - Patient had care per Anesthesia. Recommendation:           - Patient has a contact number available for                            emergencies. The signs and symptoms of potential                            delayed complications were discussed with the                            patient. Return to normal activities tomorrow.                            Written discharge instructions were provided to the                            patient.                           - Resume previous diet.                           - Continue  present medications.                           - Await pathology results.                           - Proceed with colon.                           - The findings and recommendations were discussed                            with the patient's family. Procedure Code(s):        --- Professional ---                           [phone], Esophagogastroduodenoscopy, flexible,                            transoral; with biopsy, single or multiple                           43450, Dilation of esophagus, by unguided sound or                            bougie, single or multiple passes Diagnosis Code(s):        --- Professional ---                           Q39.9, Congenital malformation  of esophagus,                            unspecified                           K29.70, Gastritis, unspecified, without bleeding                           R13.10, Dysphagia, unspecified CPT copyright 2022 [employer]. All rights reserved. The codes documented in this report are preliminary and upon coder review may  be revised to meet current compliance requirements. Lynnie Bring, MD 07/11/2023 10:24:33 AM This report has been signed electronically. Number of Addenda: 0

## 2023-07-11 NOTE — H&P (Signed)
 Expand All Collapse All     Chief Complaint: GERD, constipation Primary GI Doctor: (previously Dr. Teressa) Dr. Charlanne   HPI:  Patient is a  68  year old female patient with past medical history of GERD, DVT, PE (on Xarelto ), chronic constipation, hypertension hyperlipidemia occasional pattern of WPW,  who was referred to me by Norleen Lynwood ORN, MD on 01/26/23 for a complaint of GERD, constipation.     Last seen by cardiology 04/2022- WPW pattern on ECG intermittently, mild. Asymptomatic.  In the past have been avoiding beta-blockers.  Reviewed note. Due to see them next month.   Interval History    Patient presents for complaints of uncontrolled GERD and chronic constipation. Patient has history of GERD and currently taking Pantoprazole  40mg  po once daily. She states she has daily breakthrough symptoms of pyrosis and regurgitation and takes OTC Tums every day in between meals. She states she will eat food at times and it doesn't go down and comes right back up.    Patient also has chronic idiopathic constipation and has been taking multiple doses of OTC Miralax  without having bowel movement. Patient has bowel movement every 2-3 days with only small balls. She will have abdominal pain with lower back pain when constipated. No blood in stool. Reports taking Linzess  and it causing severe diarrhea. Apples have helped. She has chronic nausea every time she eats. She states she only has one meal per day in the evening.    Patient on Xarelto  po daily by Dr. Oneil Parchment    Patients last EGD was approximately 8 years ago with Dr. Teressa , do not have report.       Wt Readings from Last 3 Encounters:  05/27/23 (!) 343 lb (155.6 kg)  05/26/23 (!) 343 lb (155.6 kg)  01/26/23 (!) 335 lb 3.2 oz (152 kg)        Past Medical History:  Diagnosis Date   Allergic rhinitis, cause unspecified 04/29/2011   Anemia     Anemia, unspecified 04/23/2011   Back pain     Bilateral swelling of feet and ankles      Chronic anticoagulation     Chronic headaches     Colon polyps 05/2009   Complication of anesthesia      hard to wake up   Constipation     Diverticulosis     DVT (deep venous thrombosis) (HCC)      right upper extremitiy   GERD (gastroesophageal reflux disease) 11/10/2011   Gout 04/22/2010    no current problems per patient on 12/16/21   H/O blood clots     H/O: GI bleed     Hyperlipidemia     Hyperplastic colon polyp     Hypertension     IBS (irritable bowel syndrome)     Infected prosthetic knee joint (HCC)     Joint pain     Migraine 04/29/2011    otc meds prn   Morbid obesity (HCC)     Nonsmoker     Other fatigue     Pre-diabetes      no meds   Pulmonary embolism (HCC) 2010    following right knee replacement   Shortness of breath      with exertion   SOBOE (shortness of breath on exertion)     Vitamin D  deficiency     WPW (Wolff-Parkinson-White syndrome)                 Past Surgical History:  Procedure Laterality Date   CHOLECYSTECTOMY        open surgery with abd incision   COLONOSCOPY        x 7   DIAGNOSTIC LAPAROSCOPY Left      knee   DIAGNOSTIC LAPAROSCOPY Right      right wrist surgery for cyst   KNEE ARTHROSCOPY Left     MULTIPLE TOOTH EXTRACTIONS        all upper teeth extracted - upper partial   RADIOLOGY WITH ANESTHESIA N/A 04/08/2022    Procedure: CT ABDOMEN WITH AND WITHOUT CONTRAST;  Surgeon: Radiologist, Medication, MD;  Location: [facility] OR;  Service: Radiology;  Laterality: N/A;   TOTAL KNEE ARTHROPLASTY Right 01/05/2008    followed by I & D for infection  x 2   TUBAL LIGATION       WISDOM TOOTH EXTRACTION                    Current Outpatient Medications  Medication Sig Dispense Refill   acetaminophen  (TYLENOL ) 650 MG CR tablet Take 1,300 mg by mouth at bedtime.       acetic acid -hydrocortisone  (VOSOL -HC) OTIC solution Place 3 drops into both ears 2 (two) times daily. 10 mL 0   atorvastatin  (LIPITOR) 80 MG tablet Take 1 tablet (80 mg  total) by mouth daily. 90 tablet 3   carvedilol  (COREG ) 6.25 MG tablet TAKE 1 TABLET(6.25 MG) BY MOUTH TWICE DAILY 180 tablet 3   Cholecalciferol (VITAMIN D3) 50 MCG (2000 UT) CAPS Take 4,000 Units by mouth daily.       ciclopirox  (PENLAC ) 8 % solution Apply topically at bedtime. Apply over nail and surrounding skin. Apply daily over previous coat. After seven (7) days, may remove with alcohol and continue cycle. 6.6 mL 11   diclofenac  Sodium (VOLTAREN ) 1 % GEL Apply 1 Application topically.       losartan  (COZAAR ) 100 MG tablet Take 1 tablet (100 mg total) by mouth daily. 90 tablet 3   Na Sulfate-K Sulfate-Mg Sulfate concentrate (SUPREP) 17.5-3.13-1.6 GM/177ML SOLN Take 1 kit (354 mLs total) by mouth once for 1 dose. 354 mL 0   polyethylene glycol powder (GLYCOLAX /MIRALAX ) 17 GM/SCOOP powder Take twice daily ( 17gr) until stooling regular, then once daily 3350 g 1   predniSONE  (STERAPRED UNI-PAK 21 TAB) 10 MG (21) TBPK tablet Take by mouth daily. Take as directed. 21 tablet 0   spironolactone  (ALDACTONE ) 25 MG tablet TAKE 1 TABLET(25 MG) BY MOUTH DAILY 90 tablet 3   sucralfate  (CARAFATE ) 1 g tablet Take 1 tablet (1 g total) by mouth 4 (four) times daily -  with meals and at bedtime. 120 tablet 0   XARELTO  20 MG TABS tablet TAKE 1 TABLET BY MOUTH DAILY WITH SUPPER 90 tablet 1   bisacodyl (DULCOLAX) 5 MG EC tablet Take 15 mg by mouth daily as needed for moderate constipation. (Patient not taking: Reported on 05/27/2023)       chlorhexidine  (HIBICLENS ) 4 % external liquid Apply topically daily as needed (to underarms, skin folds). (Patient not taking: Reported on 05/27/2023) 236 mL 1   HYDROcodone -acetaminophen  (NORCO/VICODIN) 5-325 MG tablet Take 1 tablet by mouth every 6 (six) hours as needed for moderate pain (pain score 4-6) or severe pain (pain score 7-10). (Patient not taking: Reported on 05/27/2023) 8 tablet 0   meclizine  (ANTIVERT ) 12.5 MG tablet Take 1 tablet (12.5 mg total) by mouth 3 (three)  times daily as needed for dizziness. (Patient not taking: Reported on  05/27/2023) 40 tablet 1   ondansetron  (ZOFRAN -ODT) 4 MG disintegrating tablet Take 1 tablet (4 mg total) by mouth every 8 (eight) hours as needed for nausea or vomiting. (Patient not taking: Reported on 05/27/2023) 30 tablet 0   pantoprazole  (PROTONIX ) 40 MG tablet Take 1 tablet (40 mg total) by mouth 2 (two) times daily before a meal. 180 tablet 3      No current facility-administered medications for this visit.             Allergies as of 05/27/2023 - Review Complete 05/27/2023  Allergen Reaction Noted   Crestor [rosuvastatin calcium ] Other (See Comments) 03/27/2010   Amlodipine Other (See Comments) 03/27/2010   Ultram  [tramadol ] Nausea Only 12/15/2012   Zosyn [piperacillin sod-tazobactam so] Other (See Comments) 02/16/2010           Family History  Problem Relation Age of Onset   Heart disease Mother     Heart Problems Mother     Diabetes Father     Throat cancer Father     Pancreatic cancer Sister     Hypertension Sister     Hypertension Sister     Hypertension Sister     Hypertension Brother     Colon cancer Other     Coronary artery disease Other     Heart disease Other            Review of Systems:    Constitutional: No weight loss, fever, chills, weakness or fatigue HEENT: Eyes: No change in vision               Ears, Nose, Throat:  No change in hearing or congestion Skin: No rash or itching Cardiovascular: No chest pain, chest pressure or palpitations   Respiratory: No SOB or cough Gastrointestinal: See HPI and otherwise negative Genitourinary: No dysuria or change in urinary frequency Neurological: No headache, dizziness or syncope Musculoskeletal: No new muscle or joint pain Hematologic: No bleeding or bruising Psychiatric: No history of depression or anxiety      Physical Exam:  Vital signs: BP 136/80   Pulse 69   Ht 5' 3 (1.6 m)   Wt (!) 343 lb (155.6 kg)   LMP 04/10/2010   BMI  60.76 kg/m    Constitutional:   Pleasant female appears to be in NAD, Well developed, Well nourished, alert and cooperative Throat: Oral cavity and pharynx without inflammation, swelling or lesion.  Respiratory: Respirations even and unlabored. Lungs clear to auscultation bilaterally.   No wheezes, crackles, or rhonchi.  Cardiovascular: Normal S1, S2. Regular rate and rhythm. No peripheral edema, cyanosis or pallor.  Gastrointestinal:  Soft, nondistended, obese, non tender. No rebound or guarding. Hypoactive bowel sounds. No appreciable masses or hepatomegaly. Rectal:  Not performed.  Msk:  Symmetrical without gross deformities. Without edema, no deformity or joint abnormality.  Neurologic:  Alert and  oriented x4;  grossly normal neurologically.  Skin:   Dry and intact without significant lesions or rashes. Psychiatric: Oriented to person, place and time. Demonstrates good judgement and reason without abnormal affect or behaviors.   RELEVANT LABS AND IMAGING: CBC     Latest Ref Rng & Units 01/26/2023    2:07 PM 12/24/2022   12:18 PM 08/05/2022    1:58 PM  CBC  WBC 4.0 - 10.5 K/uL 11.0  11.1  7.4   Hemoglobin 12.0 - 15.0 g/dL 85.7  85.6  86.3   Hematocrit 36.0 - 46.0 % 44.5  44.8  43.1   Platelets  150.0 - 400.0 K/uL 267.0  253.0  210.0       CMP         Latest Ref Rng & Units 05/26/2023    2:22 PM 01/26/2023    2:07 PM 12/24/2022   12:18 PM  CMP  Glucose 70 - 99 mg/dL 89  99  884   BUN 6 - 23 mg/dL 18  23  26    Creatinine 0.40 - 1.20 mg/dL 9.18  9.15  9.09   Sodium 135 - 145 mEq/L 142  140  138   Potassium 3.5 - 5.1 mEq/L 4.1  4.7  4.8 Hemolysis seen...  C  Chloride 96 - 112 mEq/L 106  106  103   CO2 19 - 32 mEq/L 31  31  28    Calcium  8.4 - 10.5 mg/dL 89.6  89.8  89.1   Total Protein 6.0 - 8.3 g/dL 7.1  6.9  7.7   Total Bilirubin 0.2 - 1.2 mg/dL 0.9  0.8  0.8   Alkaline Phos 39 - 117 U/L 90  79  92   AST 0 - 37 U/L 14  12  17    ALT 0 - 35 U/L 11  11  14      C Corrected  result      Recent Labs       Lab Results  Component Value Date    TSH 1.52 01/26/2023    Imaging: 04/08/22 CT ABD with or without contrast IMPRESSION: Bosniak 1 left-sided 2.1 cm cyst. No specific imaging follow up. This corresponds to the finding by ultrasound. Two punctate nonobstructing lower pole right-sided renal stones. Bilateral lower lobe parenchymal opacities. Atelectasis is favored but this could also be an infiltrative based on imaging. Please correlate with symptomatology and follow-up is recommended.   01/10/10 echo- The estimated ejection fraction was in the range of 55% to 60%.    Procedures: 05/05/2009 colonoscopy with Dr. Toribio Cedar, recall 10 years Small sessile polyp in cecum  Mild diverticulosis in sigmoid to descending colon Otherwise normal examination   Assessment:     Encounter Diagnoses  Name Primary?   Gastroesophageal reflux disease, unspecified whether esophagitis present Yes   Esophageal dysphagia     Chronic idiopathic constipation       68 year old African-American female patient who presents for uncontrolled GERD taking pantoprazole  40 mg p.o. daily.  Patient will have food get caught mid chest that she regurgitates back up.  I will increase patient's pantoprazole  to twice daily and will have her eat something small in the morning after taking her first dose.  I will also order upper GI endoscopy with possible dilatation in hospital with Dr. Charlanne.     For patient's constipation she has failed over-the-counter MiraLAX  and the Linzess  pro secretory agent gives patient diarrhea therefore I will give her samples of Trulance 3 mg p.o. daily to see if this works better for her.    Patient is also due for colon screening colonoscopy with 2-day prep in hospital with Dr. Charlanne.   Plan: -Increase pantoprazole  40 mg po daily to twice daily -Samples of Trulance 3 mg po daily provided  -Schedule EGD with possible dilatation at hospital due to BMI with  Dr.Majel Giel.The risks and benefits of EGD with possible biopsies and esophageal dilation were discussed with the patient who agrees to proceed. -Schedule for a colonoscopy with 2 day prep at hospital due to BMI with Dr. Charlanne. The risks and benefits of colonoscopy with possible polypectomy / biopsies  were discussed and the patient agrees to proceed.  -Cardiac Clearance for Xarelto  Hold Xarelto   2 days before procedure - will instruct when and how to resume after procedure. Risks and benefits of procedure including bleeding, perforation, infection, missed lesions, medication reactions and possible hospitalization or surgery if complications occur explained. Additional rare but real risk of cardiovascular event such as heart attack or ischemia/infarct of other organs off Xarelto  explained and need to seek urgent help if this occurs. Will communicate by phone or EMR with patient's prescribing provider that to confirm holding Xarelto  is reasonable in this case.       Thank you for the courtesy of this consult. Please call me with any questions or concerns.    Deanna May, FNP-C [facility]        Attending physician's note   I have taken history, reviewed the chart and examined the patient. I performed a substantive portion of this encounter, including complete performance of at least one of the key components, in conjunction with the APP. I agree with the Advanced Practitioner's note, impression and recommendations.   For EGD/colon off Xeralto.   Anselm Bring, MD [facility] [phone]

## 2023-07-11 NOTE — Transfer of Care (Signed)
 Immediate Anesthesia Transfer of Care Note  Patient: Mia [name]  Procedure(s) Performed: COLONOSCOPY EGD (ESOPHAGOGASTRODUODENOSCOPY) BIOPSY, SKIN, SUBCUTANEOUS TISSUE, OR MUCOUS MEMBRANE DILATION, ESOPHAGUS, USING MALONEY DILATOR POLYPECTOMY, INTESTINE  Patient Location: PACU  Anesthesia Type:MAC  Level of Consciousness: awake, alert , oriented, and patient cooperative  Airway & Oxygen Therapy: Patient Spontanous Breathing and Patient connected to face mask oxygen  Post-op Assessment: Report given to RN and Post -op Vital signs reviewed and stable  Post vital signs: Reviewed and stable  Last Vitals:  Vitals Value Taken Time  BP 80/48 07/11/23 10:21  Temp 36.4 C 07/11/23 10:21  Pulse 84 07/11/23 10:21  Resp 30 07/11/23 10:21  SpO2 95 % 07/11/23 10:21    Last Pain:  Vitals:   07/11/23 1021  TempSrc: Temporal  PainSc: 0-No pain         Complications: No notable events documented.

## 2023-07-11 NOTE — Anesthesia Preprocedure Evaluation (Addendum)
 Anesthesia Evaluation  Patient identified by MRN, date of birth, ID band Patient awake    Reviewed: Allergy & Precautions, NPO status , Patient's Chart, lab work & pertinent test results  History of Anesthesia Complications Negative for: history of anesthetic complications  Airway Mallampati: III  TM Distance: >3 FB Neck ROM: Full    Dental  (+) Dental Advisory Given, Upper Dentures, Missing   Pulmonary PE   Pulmonary exam normal        Cardiovascular hypertension, Pt. on medications + DVT  Normal cardiovascular exam+ dysrhythmias (WPW)    '23 Myoperfusion - Findings are consistent with no prior ischemia. The study is low risk.   No ST deviation was noted.   LV perfusion is abnormal. There is a medium defect with moderate reduction in uptake present in the apical to mid inferolateral, lateral and mid inferior LV segments that are fixed. There is normal wall motion in the defect area. May be related to artifact due to extracardiac tracer uptake and diaphragmatic attentuation.   Left ventricular function is normal. Nuclear stress EF: 63 %. The left ventricular ejection fraction is normal (55-65%). End diastolic cavity size is normal.    Neuro/Psych  Headaches  Vertigo   Neuromuscular disease  negative psych ROS   GI/Hepatic Neg liver ROS,GERD  Medicated and Controlled,,  Endo/Other  diabetes, Type 2  Class 4 obesity  Renal/GU negative Renal ROS     Musculoskeletal  (+) Arthritis ,    Abdominal  (+) + obese  Peds  Hematology  On xarelto     Anesthesia Other Findings   Reproductive/Obstetrics                              Anesthesia Physical Anesthesia Plan  ASA: 4  Anesthesia Plan: MAC   Post-op Pain Management: Minimal or no pain anticipated   Induction:   PONV Risk Score and Plan: 2 and Propofol  infusion and Treatment may vary due to age or medical condition  Airway  Management Planned: Natural Airway and Simple Face Mask  Additional Equipment: None  Intra-op Plan:   Post-operative Plan:   Informed Consent: I have reviewed the patients History and Physical, chart, labs and discussed the procedure including the risks, benefits and alternatives for the proposed anesthesia with the patient or authorized representative who has indicated his/her understanding and acceptance.       Plan Discussed with: CRNA and Anesthesiologist  Anesthesia Plan Comments:          Anesthesia Quick Evaluation

## 2023-07-11 NOTE — Op Note (Signed)
 [facility] Patient Name: Mia [name] Procedure Date: 07/11/2023 MRN: [id number] Attending MD: Lynnie Bring , MD, [id number] Date of Birth: 1955/05/06 CSN: [id number] Age: 68 Admit Type: Outpatient Procedure:                Colonoscopy Indications:              Screening for colorectal malignant neoplasm.                            Chronic constipation. Providers:                Lynnie Bring, MD, Gregoria Pierce, RN, Curtistine Bishop, Technician Referring MD:              Medicines:                Monitored Anesthesia Care Complications:            No immediate complications. Estimated Blood Loss:     Estimated blood loss: none. Procedure:                Pre-Anesthesia Assessment:                           - Prior to the procedure, a History and Physical                            was performed, and patient medications and                            allergies were reviewed. The patient's tolerance of                            previous anesthesia was also reviewed. The risks                            and benefits of the procedure and the sedation                            options and risks were discussed with the patient.                            All questions were answered, and informed consent                            was obtained. Prior Anticoagulants: The patient has                            taken Xarelto  (rivaroxaban ), last dose was 1 day                            prior to procedure. ASA Grade Assessment: IV - A                            patient with  severe systemic disease that is a                            constant threat to life. After reviewing the risks                            and benefits, the patient was deemed in                            satisfactory condition to undergo the procedure.                           After obtaining informed consent, the colonoscope                            was passed under  direct vision. Throughout the                            procedure, the patient's blood pressure, pulse, and                            oxygen saturations were monitored continuously. The                            CF-HQ190L ([id number] Olympus colonoscope was                            introduced through the anus and advanced to the the                            cecum, identified by the ileocecal valve. The                            colonoscopy was somewhat difficult due to                            inadequate bowel prep (in R colon), a tortuous                            colon and the patient's body habitus. The patient                            tolerated the procedure well.                           Significant retained solid stool in the right side                            of the colon making it impossible to visualize                            despite aggressive suctioning and aspiration  despite 2 day prep. The quality of prep in the                            remaining colon was good. Scope In: 9:54:44 AM Scope Out: 10:14:03 AM Scope Withdrawal Time: 0 hours 8 minutes 26 seconds  Total Procedure Duration: 0 hours 19 minutes 19 seconds  Findings:      A 15 mm polyp was found in the distal ascending colon. The polyp was       semi-pedunculated. The polyp was removed with a hot snare. Resection and       retrieval were complete.      A 6 mm polyp was found in the mid transverse colon. The polyp was       sessile. The polyp was removed with a cold snare. Resection and       retrieval were complete.      Multiple medium-mouthed diverticula were found in the sigmoid colon.      Non-bleeding internal hemorrhoids were found during retroflexion. The       hemorrhoids were moderate and Grade I (internal hemorrhoids that do not       prolapse).      The exam was otherwise without abnormality on direct and retroflexion       views. Impression:               -  One 15 mm polyp in the distal ascending colon,                            removed with a hot snare. Resected and retrieved.                           - One 6 mm polyp in the mid transverse colon,                            removed with a cold snare. Resected and retrieved.                           - Diverticulosis in the sigmoid colon.                           - Non-bleeding internal hemorrhoids.                           - The examination was otherwise normal on direct                            and retroflexion views.                           - Limited exam of right colon despite 2-day prep. Moderate Sedation:      Not Applicable - Patient had care per Anesthesia. Recommendation:           - Patient has a contact number available for                            emergencies. The signs and symptoms of potential  delayed complications were discussed with the                            patient. Return to normal activities tomorrow.                            Written discharge instructions were provided to the                            patient.                           - Resume previous diet.                           - Resume Xarelto  (rivaroxaban ) at prior dose 7/10.                           - Please continue MiraLAX  17 g p.o. twice daily in                            addition to Trulance.                           - Recommend repeating colonoscopy after a prolonged                            preparation (3 day) in 3 to 6 months for evaluation                            of right colon.                           - The findings and recommendations were discussed                            with the patient's family. Procedure Code(s):        --- Professional ---                           [phone], Colonoscopy, flexible; with removal of                            tumor(s), polyp(s), or other lesion(s) by snare                            technique Diagnosis Code(s):         --- Professional ---                           Z12.11, Encounter for screening for malignant                            neoplasm of colon  D12.2, Benign neoplasm of ascending colon                           D12.3, Benign neoplasm of transverse colon (hepatic                            flexure or splenic flexure)                           K64.0, First degree hemorrhoids                           K57.30, Diverticulosis of large intestine without                            perforation or abscess without bleeding CPT copyright 2022 American [employer]. All rights reserved. The codes documented in this report are preliminary and upon coder review may  be revised to meet current compliance requirements. Lynnie Bring, MD 07/11/2023 10:32:47 AM This report has been signed electronically. Number of Addenda: 0

## 2023-07-11 NOTE — Anesthesia Postprocedure Evaluation (Signed)
 Anesthesia Post Note  Patient: Mia [name]  Procedure(s) Performed: COLONOSCOPY EGD (ESOPHAGOGASTRODUODENOSCOPY) BIOPSY, SKIN, SUBCUTANEOUS TISSUE, OR MUCOUS MEMBRANE DILATION, ESOPHAGUS, USING MALONEY DILATOR POLYPECTOMY, INTESTINE     Patient location during evaluation: PACU Anesthesia Type: MAC Level of consciousness: awake and alert Pain management: pain level controlled Vital Signs Assessment: post-procedure vital signs reviewed and stable Respiratory status: spontaneous breathing, nonlabored ventilation and respiratory function stable Cardiovascular status: stable and blood pressure returned to baseline Anesthetic complications: no   No notable events documented.  Last Vitals:  Vitals:   07/11/23 1040 07/11/23 1050  BP: 127/82 126/88  Pulse: 88 73  Resp: 20 (!) 25  Temp:    SpO2: 93% 95%    Last Pain:  Vitals:   07/11/23 1050  TempSrc:   PainSc: 0-No pain                 Debby FORBES Like

## 2023-07-11 NOTE — Discharge Instructions (Signed)

## 2023-07-12 ENCOUNTER — Encounter (HOSPITAL_COMMUNITY): Payer: Self-pay | Admitting: Gastroenterology

## 2023-07-12 DIAGNOSIS — L02412 Cutaneous abscess of left axilla: Secondary | ICD-10-CM | POA: Diagnosis not present

## 2023-07-13 ENCOUNTER — Ambulatory Visit: Payer: Self-pay | Admitting: Gastroenterology

## 2023-07-13 LAB — SURGICAL PATHOLOGY

## 2023-07-26 ENCOUNTER — Other Ambulatory Visit: Payer: Self-pay | Admitting: Internal Medicine

## 2023-07-26 DIAGNOSIS — I5032 Chronic diastolic (congestive) heart failure: Secondary | ICD-10-CM

## 2023-07-26 DIAGNOSIS — Z5181 Encounter for therapeutic drug level monitoring: Secondary | ICD-10-CM

## 2023-08-02 ENCOUNTER — Encounter: Payer: Self-pay | Admitting: Internal Medicine

## 2023-08-02 ENCOUNTER — Ambulatory Visit (INDEPENDENT_AMBULATORY_CARE_PROVIDER_SITE_OTHER): Admitting: Internal Medicine

## 2023-08-02 VITALS — BP 174/86 | HR 67 | Temp 100.6°F | Ht 63.0 in | Wt 332.0 lb

## 2023-08-02 DIAGNOSIS — E559 Vitamin D deficiency, unspecified: Secondary | ICD-10-CM

## 2023-08-02 DIAGNOSIS — E1165 Type 2 diabetes mellitus with hyperglycemia: Secondary | ICD-10-CM

## 2023-08-02 DIAGNOSIS — R300 Dysuria: Secondary | ICD-10-CM | POA: Diagnosis not present

## 2023-08-02 DIAGNOSIS — E538 Deficiency of other specified B group vitamins: Secondary | ICD-10-CM | POA: Diagnosis not present

## 2023-08-02 DIAGNOSIS — I10 Essential (primary) hypertension: Secondary | ICD-10-CM | POA: Diagnosis not present

## 2023-08-02 MED ORDER — LEVOFLOXACIN 500 MG PO TABS: 500.0000 mg | ORAL_TABLET | Freq: Every day | ORAL | 0 refills | Status: DC

## 2023-08-02 NOTE — Assessment & Plan Note (Signed)
 Lab Results  Component Value Date   HGBA1C 6.4 05/26/2023   Stable, pt to continue current medical treatment  - diet, wt control

## 2023-08-02 NOTE — Assessment & Plan Note (Signed)
 Lab Results  Component Value Date   VITAMINB12 295 01/26/2023   Low, to start oral replacement - b12 1000 mcg qd

## 2023-08-02 NOTE — Assessment & Plan Note (Signed)
 Last vitamin D Lab Results  Component Value Date   VD25OH 39.39 01/26/2023   Low, to start oral replacement

## 2023-08-02 NOTE — Assessment & Plan Note (Signed)
 High suspicion for UTI  - for empiric levaquin    500 every day, and check ua and cx

## 2023-08-02 NOTE — Patient Instructions (Addendum)
Please take all new medication as prescribed - the antibiotic  Please continue all other medications as before, and refills have been done if requested.  Please have the pharmacy call with any other refills you may need.  Please keep your appointments with your specialists as you may have planned  Please go to the LAB at the blood drawing area for the tests to be done  You will be contacted by phone if any changes need to be made immediately.  Otherwise, you will receive a letter about your results with an explanation, but please check with MyChart first.

## 2023-08-02 NOTE — Assessment & Plan Note (Signed)
 BP Readings from Last 3 Encounters:  08/02/23 (!) 174/86  07/11/23 126/88  06/27/23 126/74   uncontrolled likely reactive, pt to continue medical treatment  coreg  6.25 mg bid, losartan  100 mg qd

## 2023-08-02 NOTE — Progress Notes (Signed)
 Patient ID: Mia [name], female   DOB: Aug 14, 1955, 68 y.o.   MRN: [id number]        Chief Complaint: follow up dysuria, low vit d, dm, htn, low b12       HPI:  Mia [name] is a 68 y.o. female here with 3 days onset gradually worsening now mod to severe dysuria with frequency and urgency and some dizziness, low grade temp elevation, but Denies urinary symptoms such as flank pain, hematuria or n/v, chills.  Pt denies chest pain, increased sob or doe, wheezing, orthopnea, PND, increased LE swelling, palpitations,  Pt denies polydipsia, polyuria,       Wt Readings from Last 3 Encounters:  08/02/23 (!) 332 lb (150.6 kg)  07/11/23 (!) 339 lb 1.1 oz (153.8 kg)  06/27/23 (!) 339 lb (153.8 kg)   BP Readings from Last 3 Encounters:  08/02/23 (!) 174/86  07/11/23 126/88  06/27/23 126/74         Past Medical History:  Diagnosis Date   Allergic rhinitis, cause unspecified 04/29/2011   Anemia    Anemia, unspecified 04/23/2011   Back pain    Bilateral swelling of feet and ankles    Chronic anticoagulation    Chronic headaches    Colon polyps 05/2009   Complication of anesthesia    hard to wake up   Constipation    Diverticulosis    DVT (deep venous thrombosis) (HCC)    right upper extremitiy   GERD (gastroesophageal reflux disease) 11/10/2011   Gout 04/22/2010   no current problems per patient on 12/16/21   H/O blood clots    H/O: GI bleed    Hyperlipidemia    Hyperplastic colon polyp    Hypertension    IBS (irritable bowel syndrome)    Infected prosthetic knee joint (HCC)    Joint pain    Migraine 04/29/2011   otc meds prn   Morbid obesity (HCC)    Nonsmoker    Other fatigue    Pre-diabetes    no meds   Pulmonary embolism (HCC) 2010   following right knee replacement   Shortness of breath    with exertion   SOBOE (shortness of breath on exertion)    Vitamin D  deficiency    WPW (Wolff-Parkinson-White syndrome)    Past Surgical History:  Procedure Laterality  Date   BIOPSY OF SKIN SUBCUTANEOUS TISSUE AND/OR MUCOUS MEMBRANE  07/11/2023   Procedure: BIOPSY, SKIN, SUBCUTANEOUS TISSUE, OR MUCOUS MEMBRANE;  Surgeon: Charlanne Groom, MD;  Location: [facility];  Service: Gastroenterology;;   CHOLECYSTECTOMY     open surgery with abd incision   COLONOSCOPY     x 7   COLONOSCOPY N/A 07/11/2023   Procedure: COLONOSCOPY;  Surgeon: Charlanne Groom, MD;  Location: [facility];  Service: Gastroenterology;  Laterality: N/A;   DIAGNOSTIC LAPAROSCOPY Left    knee   DIAGNOSTIC LAPAROSCOPY Right    right wrist surgery for cyst   ESOPHAGOGASTRODUODENOSCOPY N/A 07/11/2023   Procedure: EGD (ESOPHAGOGASTRODUODENOSCOPY);  Surgeon: Charlanne Groom, MD;  Location: [facility];  Service: Gastroenterology;  Laterality: N/A;   KNEE ARTHROSCOPY Left    MALONEY DILATION  07/11/2023   Procedure: DILATION, ESOPHAGUS, USING MALONEY DILATOR;  Surgeon: Charlanne Groom, MD;  Location: [facility];  Service: Gastroenterology;;   MULTIPLE TOOTH EXTRACTIONS     all upper teeth extracted - upper partial   POLYPECTOMY  07/11/2023   Procedure: POLYPECTOMY, INTESTINE;  Surgeon: Charlanne Groom, MD;  Location: [facility];  Service: Gastroenterology;;  RADIOLOGY WITH ANESTHESIA N/A 04/08/2022   Procedure: CT ABDOMEN WITH AND WITHOUT CONTRAST;  Surgeon: Radiologist, Medication, MD;  Location: [facility];  Service: Radiology;  Laterality: N/A;   TOTAL KNEE ARTHROPLASTY Right 01/05/2008   followed by I & D for infection  x 2   TUBAL LIGATION     WISDOM TOOTH EXTRACTION      reports that she has never smoked. She has never used smokeless tobacco. She reports that she does not drink alcohol and does not use drugs. family history includes Colon cancer in an other family member; Coronary artery disease in an other family member; Diabetes in her father; Heart Problems in her mother; Heart disease in her mother and another family member; Hypertension in her brother, sister, sister, and sister; Pancreatic cancer in  her sister; Throat cancer in her father. Allergies  Allergen Reactions   Crestor [Rosuvastatin Calcium ] Other (See Comments)    Chest pain   Amlodipine Other (See Comments)    Edema, headache    Ultram  [Tramadol ] Nausea Only   Zosyn [Piperacillin Sod-Tazobactam So] Other (See Comments)    REACTION: UNSURE   Current Outpatient Medications on File Prior to Visit  Medication Sig Dispense Refill   acetaminophen  (TYLENOL ) 650 MG CR tablet Take 1,300 mg by mouth at bedtime.     acetic acid -hydrocortisone  (VOSOL -HC) OTIC solution Place 3 drops into both ears 2 (two) times daily. 10 mL 0   atorvastatin  (LIPITOR) 80 MG tablet TAKE 1 TABLET(80 MG) BY MOUTH DAILY 90 tablet 3   carvedilol  (COREG ) 6.25 MG tablet TAKE 1 TABLET(6.25 MG) BY MOUTH TWICE DAILY 180 tablet 3   chlorhexidine  (HIBICLENS ) 4 % external liquid Apply topically daily as needed (to underarms, skin folds). 236 mL 1   Cholecalciferol (VITAMIN D3) 50 MCG (2000 UT) CAPS Take 4,000 Units by mouth daily.     ciclopirox  (PENLAC ) 8 % solution Apply topically at bedtime. Apply over nail and surrounding skin. Apply daily over previous coat. After seven (7) days, may remove with alcohol and continue cycle. 6.6 mL 11   diclofenac  Sodium (VOLTAREN ) 1 % GEL Apply 1 Application topically.     HYDROcodone -acetaminophen  (NORCO/VICODIN) 5-325 MG tablet Take 1 tablet by mouth every 6 (six) hours as needed. 30 tablet 0   losartan  (COZAAR ) 100 MG tablet Take 1 tablet (100 mg total) by mouth daily. 90 tablet 3   pantoprazole  (PROTONIX ) 40 MG tablet Take 1 tablet (40 mg total) by mouth 2 (two) times daily before a meal. 180 tablet 3   polyethylene glycol powder (GLYCOLAX /MIRALAX ) 17 GM/SCOOP powder Take twice daily ( 17gr) until stooling regular, then once daily 3350 g 1   predniSONE  (STERAPRED UNI-PAK 21 TAB) 10 MG (21) TBPK tablet Take by mouth daily. Take as directed. 21 tablet 0   rivaroxaban  (XARELTO ) 20 MG TABS tablet Take 1 tablet (20 mg total) by  mouth daily with supper. 90 tablet 1   rivaroxaban  (XARELTO ) 20 MG TABS tablet Take 1 tablet (20 mg total) by mouth daily with supper. 14 tablet 0   spironolactone  (ALDACTONE ) 25 MG tablet TAKE 1 TABLET(25 MG) BY MOUTH DAILY 90 tablet 3   sucralfate  (CARAFATE ) 1 g tablet Take 1 tablet (1 g total) by mouth 4 (four) times daily -  with meals and at bedtime. 120 tablet 0   sulfamethoxazole -trimethoprim  (BACTRIM  DS) 800-160 MG tablet Take 1 tablet by mouth 2 (two) times daily. 20 tablet 0   terbinafine  (LAMISIL ) 250 MG tablet Take 1 tablet (250  mg total) by mouth daily. 42 tablet 1   bisacodyl (DULCOLAX) 5 MG EC tablet Take 15 mg by mouth daily as needed for moderate constipation. (Patient not taking: Reported on 08/02/2023)     HYDROcodone -acetaminophen  (NORCO/VICODIN) 5-325 MG tablet Take 1 tablet by mouth every 6 (six) hours as needed for moderate pain (pain score 4-6) or severe pain (pain score 7-10). (Patient not taking: Reported on 08/02/2023) 8 tablet 0   meclizine  (ANTIVERT ) 12.5 MG tablet Take 1 tablet (12.5 mg total) by mouth 3 (three) times daily as needed for dizziness. (Patient not taking: Reported on 08/02/2023) 40 tablet 1   ondansetron  (ZOFRAN -ODT) 4 MG disintegrating tablet Take 1 tablet (4 mg total) by mouth every 8 (eight) hours as needed for nausea or vomiting. (Patient not taking: Reported on 08/02/2023) 30 tablet 0   No current facility-administered medications on file prior to visit.        ROS:  All others reviewed and negative.  Objective        PE:  BP (!) 174/86   Pulse 67   Temp (!) 100.6 F (38.1 C)   Ht 5' 3 (1.6 m)   Wt (!) 332 lb (150.6 kg)   LMP 04/10/2010   SpO2 92%   BMI 58.81 kg/m                 Constitutional: Pt appears mild ill, non toxic               HENT: Head: NCAT.                Right Ear: External ear normal.                 Left Ear: External ear normal.                Eyes: . Pupils are equal, round, and reactive to light. Conjunctivae and EOM  are normal               Nose: without d/c or deformity               Neck: Neck supple. Gross normal ROM               Cardiovascular: Normal rate and regular rhythm.                 Pulmonary/Chest: Effort normal and breath sounds without rales or wheezing.                Abd:  Soft, tender low mid abd, ND, + BS, no organomegaly               Neurological: Pt is alert. At baseline orientation, motor grossly intact               Skin: Skin is warm. No rashes, no other new lesions, LE edema - trace bilateral               Psychiatric: Pt behavior is normal without agitation   Micro: none  Cardiac tracings I have personally interpreted today:  none  Pertinent Radiological findings (summarize): none   Lab Results  Component Value Date   WBC 11.0 (H) 01/26/2023   HGB 14.2 01/26/2023   HCT 44.5 01/26/2023   PLT 267.0 01/26/2023   GLUCOSE 89 05/26/2023   CHOL 158 05/26/2023   TRIG 80.0 05/26/2023   HDL 46.60 05/26/2023   LDLDIRECT 148.9 12/11/2012   LDLCALC 96 05/26/2023   ALT  11 05/26/2023   AST 14 05/26/2023   NA 142 05/26/2023   K 4.1 05/26/2023   CL 106 05/26/2023   CREATININE 0.81 05/26/2023   BUN 18 05/26/2023   CO2 31 05/26/2023   TSH 1.52 01/26/2023   INR 2.8 03/28/2018   HGBA1C 6.4 05/26/2023   Assessment/Plan:  ZADIA UHDE is a 68 y.o. Black or African American [2] female with  has a past medical history of Allergic rhinitis, cause unspecified (04/29/2011), Anemia, Anemia, unspecified (04/23/2011), Back pain, Bilateral swelling of feet and ankles, Chronic anticoagulation, Chronic headaches, Colon polyps (05/2009), Complication of anesthesia, Constipation, Diverticulosis, DVT (deep venous thrombosis) (HCC), GERD (gastroesophageal reflux disease) (11/10/2011), Gout (04/22/2010), H/O blood clots, H/O: GI bleed, Hyperlipidemia, Hyperplastic colon polyp, Hypertension, IBS (irritable bowel syndrome), Infected prosthetic knee joint (HCC), Joint pain, Migraine  (04/29/2011), Morbid obesity (HCC), Nonsmoker, Other fatigue, Pre-diabetes, Pulmonary embolism (HCC) (2010), Shortness of breath, SOBOE (shortness of breath on exertion), Vitamin D  deficiency, and WPW (Wolff-Parkinson-White syndrome).  Vitamin D  deficiency Last vitamin D  Lab Results  Component Value Date   VD25OH 39.39 01/26/2023   Low, to start oral replacement   Type 2 diabetes mellitus with hyperglycemia, without long-term current use of insulin (HCC) Lab Results  Component Value Date   HGBA1C 6.4 05/26/2023   Stable, pt to continue current medical treatment  - diet, wt control   HTN (hypertension) BP Readings from Last 3 Encounters:  08/02/23 (!) 174/86  07/11/23 126/88  06/27/23 126/74   uncontrolled likely reactive, pt to continue medical treatment  coreg  6.25 mg bid, losartan  100 mg qd   B12 deficiency Lab Results  Component Value Date   VITAMINB12 295 01/26/2023   Low, to start oral replacement - b12 1000 mcg qd   Dysuria High suspicion for UTI  - for empiric levaquin    500 every day, and check ua and cx  Followup: Return in about 1 week (around 08/09/2023).  Lynwood Rush, MD 08/02/2023 7:52 PM [facility]

## 2023-08-03 ENCOUNTER — Ambulatory Visit: Payer: Self-pay | Admitting: Internal Medicine

## 2023-08-03 LAB — MICROALBUMIN / CREATININE URINE RATIO
Creatinine,U: 127.7 mg/dL
Microalb Creat Ratio: 635.6 mg/g — ABNORMAL HIGH (ref 0.0–30.0)
Microalb, Ur: 81.2 mg/dL — ABNORMAL HIGH (ref 0.0–1.9)

## 2023-08-03 LAB — URINALYSIS, ROUTINE W REFLEX MICROSCOPIC
Bilirubin Urine: NEGATIVE
Ketones, ur: NEGATIVE
Nitrite: POSITIVE — AB
Specific Gravity, Urine: 1.015 (ref 1.000–1.030)
Total Protein, Urine: >=300 — AB
Urine Glucose: NEGATIVE
Urobilinogen, UA: 1 (ref 0.0–1.0)
pH: 6.5 (ref 5.0–8.0)

## 2023-08-05 LAB — URINE CULTURE

## 2023-08-09 ENCOUNTER — Ambulatory Visit: Admitting: Internal Medicine

## 2023-08-09 ENCOUNTER — Encounter: Payer: Self-pay | Admitting: Internal Medicine

## 2023-08-09 VITALS — BP 130/74 | HR 64 | Temp 97.8°F | Ht 63.0 in | Wt 338.0 lb

## 2023-08-09 DIAGNOSIS — I10 Essential (primary) hypertension: Secondary | ICD-10-CM | POA: Diagnosis not present

## 2023-08-09 DIAGNOSIS — E559 Vitamin D deficiency, unspecified: Secondary | ICD-10-CM

## 2023-08-09 DIAGNOSIS — E1165 Type 2 diabetes mellitus with hyperglycemia: Secondary | ICD-10-CM

## 2023-08-09 DIAGNOSIS — E538 Deficiency of other specified B group vitamins: Secondary | ICD-10-CM | POA: Diagnosis not present

## 2023-08-09 LAB — URINALYSIS, ROUTINE W REFLEX MICROSCOPIC
Bilirubin Urine: NEGATIVE
Hgb urine dipstick: NEGATIVE
Ketones, ur: NEGATIVE
Leukocytes,Ua: NEGATIVE
Nitrite: NEGATIVE
RBC / HPF: NONE SEEN (ref 0–?)
Specific Gravity, Urine: >=1.03 — AB (ref 1.000–1.030)
Total Protein, Urine: NEGATIVE
Urine Glucose: NEGATIVE
Urobilinogen, UA: 0.2 (ref 0.0–1.0)
pH: 6 (ref 5.0–8.0)

## 2023-08-09 LAB — CBC WITH DIFFERENTIAL/PLATELET
Basophils Absolute: 0 K/uL (ref 0.0–0.1)
Basophils Relative: 0.5 % (ref 0.0–3.0)
Eosinophils Absolute: 0.1 K/uL (ref 0.0–0.7)
Eosinophils Relative: 1.2 % (ref 0.0–5.0)
HCT: 41.3 % (ref 36.0–46.0)
Hemoglobin: 13.4 g/dL (ref 12.0–15.0)
Lymphocytes Relative: 26.9 % (ref 12.0–46.0)
Lymphs Abs: 2.1 K/uL (ref 0.7–4.0)
MCHC: 32.4 g/dL (ref 30.0–36.0)
MCV: 83.9 fl (ref 78.0–100.0)
Monocytes Absolute: 0.5 K/uL (ref 0.1–1.0)
Monocytes Relative: 5.8 % (ref 3.0–12.0)
Neutro Abs: 5.2 K/uL (ref 1.4–7.7)
Neutrophils Relative %: 65.6 % (ref 43.0–77.0)
Platelets: 253 K/uL (ref 150.0–400.0)
RBC: 4.92 Mil/uL (ref 3.87–5.11)
RDW: 15.5 % (ref 11.5–15.5)
WBC: 7.9 K/uL (ref 4.0–10.5)

## 2023-08-09 LAB — BASIC METABOLIC PANEL WITH GFR
BUN: 17 mg/dL (ref 6–23)
CO2: 28 meq/L (ref 19–32)
Calcium: 9.9 mg/dL (ref 8.4–10.5)
Chloride: 108 meq/L (ref 96–112)
Creatinine, Ser: 0.84 mg/dL (ref 0.40–1.20)
GFR: 72.69 mL/min (ref >60.00)
Glucose, Bld: 97 mg/dL (ref 70–99)
Potassium: 4.3 meq/L (ref 3.5–5.1)
Sodium: 143 meq/L (ref 135–145)

## 2023-08-09 LAB — HEPATIC FUNCTION PANEL
ALT: 20 U/L (ref 0–35)
AST: 19 U/L (ref 0–37)
Albumin: 3.6 g/dL (ref 3.5–5.2)
Alkaline Phosphatase: 68 U/L (ref 39–117)
Bilirubin, Direct: 0.1 mg/dL (ref 0.0–0.3)
Total Bilirubin: 0.5 mg/dL (ref 0.2–1.2)
Total Protein: 7 g/dL (ref 6.0–8.3)

## 2023-08-09 LAB — LIPID PANEL
Cholesterol: 137 mg/dL (ref 0–200)
HDL: 31.4 mg/dL — ABNORMAL LOW (ref >39.00)
LDL Cholesterol: 87 mg/dL (ref 0–99)
NonHDL: 105.45
Total CHOL/HDL Ratio: 4
Triglycerides: 94 mg/dL (ref 0.0–149.0)
VLDL: 18.8 mg/dL (ref 0.0–40.0)

## 2023-08-09 LAB — VITAMIN B12: Vitamin B-12: 324 pg/mL (ref 211–911)

## 2023-08-09 LAB — HEMOGLOBIN A1C: Hgb A1c MFr Bld: 6.7 % — ABNORMAL HIGH (ref 4.6–6.5)

## 2023-08-09 LAB — TSH: TSH: 0.51 u[IU]/mL (ref 0.35–5.50)

## 2023-08-09 LAB — MICROALBUMIN / CREATININE URINE RATIO
Creatinine,U: 131.2 mg/dL
Microalb Creat Ratio: UNDETERMINED mg/g (ref 0.0–30.0)
Microalb, Ur: <0.7 mg/dL

## 2023-08-09 LAB — VITAMIN D 25 HYDROXY (VIT D DEFICIENCY, FRACTURES): VITD: 31.78 ng/mL (ref 30.00–100.00)

## 2023-08-09 NOTE — Assessment & Plan Note (Signed)
 Lab Results  Component Value Date   HGBA1C 6.7 (H) 08/09/2023   uncontrolled, pt to continue current medical treatment  - diet, wt control, declines mounjaro for now

## 2023-08-09 NOTE — Progress Notes (Signed)
 Patient ID: Mia [name], female   DOB: [birth year], 68 y.o.   MRN: [id number]        Chief Complaint: follow up low vit d, dm, htn, low b12       HPI:  Mia [name] is a 69 y.o. female here overall doing ok,  Pt denies chest pain, increased sob or doe, wheezing, orthopnea, PND, increased LE swelling, palpitations, dizziness or syncope.   Pt denies polydipsia, polyuria, or new focal neuro s/s.    Pt denies fever, wt loss, night sweats, loss of appetite, or other constitutional symptoms  Unable to lose wt.  Has chornic right knee pain worsening without effusion, giveaways or falls.  Has itching and pain to right ear canal, asks for ENT referral  Recent UTI symptoms now improved.  Denies urinary symptoms such as dysuria, frequency, urgency, flank pain, hematuria or n/v, fever, chills.         Wt Readings from Last 3 Encounters:  08/09/23 (!) 338 lb (153.3 kg)  08/02/23 (!) 332 lb (150.6 kg)  07/11/23 (!) 339 lb 1.1 oz (153.8 kg)   BP Readings from Last 3 Encounters:  08/09/23 130/74  08/02/23 (!) 174/86  07/11/23 126/88         Past Medical History:  Diagnosis Date   Allergic rhinitis, cause unspecified 04/29/2011   Anemia    Anemia, unspecified 04/23/2011   Back pain    Bilateral swelling of feet and ankles    Chronic anticoagulation    Chronic headaches    Colon polyps 05/2009   Complication of anesthesia    hard to wake up   Constipation    Diverticulosis    DVT (deep venous thrombosis) (HCC)    right upper extremitiy   GERD (gastroesophageal reflux disease) 11/10/2011   Gout 04/22/2010   no current problems per patient on 12/16/21   H/O blood clots    H/O: GI bleed    Hyperlipidemia    Hyperplastic colon polyp    Hypertension    IBS (irritable bowel syndrome)    Infected prosthetic knee joint (HCC)    Joint pain    Migraine 04/29/2011   otc meds prn   Morbid obesity (HCC)    Nonsmoker    Other fatigue    Pre-diabetes    no meds   Pulmonary embolism  (HCC) 2010   following right knee replacement   Shortness of breath    with exertion   SOBOE (shortness of breath on exertion)    Vitamin D  deficiency    WPW (Wolff-Parkinson-White syndrome)    Past Surgical History:  Procedure Laterality Date   BIOPSY OF SKIN SUBCUTANEOUS TISSUE AND/OR MUCOUS MEMBRANE  07/11/2023   Procedure: BIOPSY, SKIN, SUBCUTANEOUS TISSUE, OR MUCOUS MEMBRANE;  Surgeon: Charlanne Groom, MD;  Location: [facility];  Service: Gastroenterology;;   CHOLECYSTECTOMY     open surgery with abd incision   COLONOSCOPY     x 7   COLONOSCOPY N/A 07/11/2023   Procedure: COLONOSCOPY;  Surgeon: Charlanne Groom, MD;  Location: [facility];  Service: Gastroenterology;  Laterality: N/A;   DIAGNOSTIC LAPAROSCOPY Left    knee   DIAGNOSTIC LAPAROSCOPY Right    right wrist surgery for cyst   ESOPHAGOGASTRODUODENOSCOPY N/A 07/11/2023   Procedure: EGD (ESOPHAGOGASTRODUODENOSCOPY);  Surgeon: Charlanne Groom, MD;  Location: [facility];  Service: Gastroenterology;  Laterality: N/A;   KNEE ARTHROSCOPY Left    MALONEY DILATION  07/11/2023   Procedure: DILATION, ESOPHAGUS, USING MALONEY DILATOR;  Surgeon: Charlanne,  Lynnie, MD;  Location: [facility];  Service: Gastroenterology;;   MULTIPLE TOOTH EXTRACTIONS     all upper teeth extracted - upper partial   POLYPECTOMY  07/11/2023   Procedure: POLYPECTOMY, INTESTINE;  Surgeon: Charlanne Lynnie, MD;  Location: [facility];  Service: Gastroenterology;;   RADIOLOGY WITH ANESTHESIA N/A 04/08/2022   Procedure: CT ABDOMEN WITH AND WITHOUT CONTRAST;  Surgeon: Radiologist, Medication, MD;  Location: [facility];  Service: Radiology;  Laterality: N/A;   TOTAL KNEE ARTHROPLASTY Right 01/05/2008   followed by I & D for infection  x 2   TUBAL LIGATION     WISDOM TOOTH EXTRACTION      reports that she has never smoked. She has never used smokeless tobacco. She reports that she does not drink alcohol and does not use drugs. family history includes Colon cancer in an other  family member; Coronary artery disease in an other family member; Diabetes in her father; Heart Problems in her mother; Heart disease in her mother and another family member; Hypertension in her brother, sister, sister, and sister; Pancreatic cancer in her sister; Throat cancer in her father. Allergies  Allergen Reactions   Crestor [Rosuvastatin Calcium ] Other (See Comments)    Chest pain   Amlodipine Other (See Comments)    Edema, headache    Ultram  [Tramadol ] Nausea Only   Zosyn [Piperacillin Sod-Tazobactam So] Other (See Comments)    REACTION: UNSURE   Current Outpatient Medications on File Prior to Visit  Medication Sig Dispense Refill   acetaminophen  (TYLENOL ) 650 MG CR tablet Take 1,300 mg by mouth at bedtime.     acetic acid -hydrocortisone  (VOSOL -HC) OTIC solution Place 3 drops into both ears 2 (two) times daily. 10 mL 0   atorvastatin  (LIPITOR) 80 MG tablet TAKE 1 TABLET(80 MG) BY MOUTH DAILY 90 tablet 3   carvedilol  (COREG ) 6.25 MG tablet TAKE 1 TABLET(6.25 MG) BY MOUTH TWICE DAILY 180 tablet 3   chlorhexidine  (HIBICLENS ) 4 % external liquid Apply topically daily as needed (to underarms, skin folds). 236 mL 1   Cholecalciferol (VITAMIN D3) 50 MCG (2000 UT) CAPS Take 4,000 Units by mouth daily.     ciclopirox  (PENLAC ) 8 % solution Apply topically at bedtime. Apply over nail and surrounding skin. Apply daily over previous coat. After seven (7) days, may remove with alcohol and continue cycle. 6.6 mL 11   diclofenac  Sodium (VOLTAREN ) 1 % GEL Apply 1 Application topically.     HYDROcodone -acetaminophen  (NORCO/VICODIN) 5-325 MG tablet Take 1 tablet by mouth every 6 (six) hours as needed. 30 tablet 0   levofloxacin  (LEVAQUIN ) 500 MG tablet Take 1 tablet (500 mg total) by mouth daily. 10 tablet 0   losartan  (COZAAR ) 100 MG tablet Take 1 tablet (100 mg total) by mouth daily. 90 tablet 3   pantoprazole  (PROTONIX ) 40 MG tablet Take 1 tablet (40 mg total) by mouth 2 (two) times daily before a  meal. 180 tablet 3   polyethylene glycol powder (GLYCOLAX /MIRALAX ) 17 GM/SCOOP powder Take twice daily ( 17gr) until stooling regular, then once daily 3350 g 1   predniSONE  (STERAPRED UNI-PAK 21 TAB) 10 MG (21) TBPK tablet Take by mouth daily. Take as directed. 21 tablet 0   rivaroxaban  (XARELTO ) 20 MG TABS tablet Take 1 tablet (20 mg total) by mouth daily with supper. 90 tablet 1   rivaroxaban  (XARELTO ) 20 MG TABS tablet Take 1 tablet (20 mg total) by mouth daily with supper. 14 tablet 0   spironolactone  (ALDACTONE ) 25 MG tablet TAKE 1  TABLET(25 MG) BY MOUTH DAILY 90 tablet 3   sucralfate  (CARAFATE ) 1 g tablet Take 1 tablet (1 g total) by mouth 4 (four) times daily -  with meals and at bedtime. 120 tablet 0   terbinafine  (LAMISIL ) 250 MG tablet Take 1 tablet (250 mg total) by mouth daily. 42 tablet 1   No current facility-administered medications on file prior to visit.        ROS:  All others reviewed and negative.  Objective        PE:  BP 130/74   Pulse 64   Temp 97.8 F (36.6 C)   Ht 5' 3 (1.6 m)   Wt (!) 338 lb (153.3 kg)   LMP 04/10/2010   SpO2 96%   BMI 59.87 kg/m                 Constitutional: Pt appears in NAD,, morbid obese               HENT: Head: NCAT.                Right Ear: External ear normal.  Right canal with mild erythema, no swelling or wax               Left Ear: External ear normal.                Eyes: . Pupils are equal, round, and reactive to light. Conjunctivae and EOM are normal               Nose: without d/c or deformity               Neck: Neck supple. Gross normal ROM               Cardiovascular: Normal rate and regular rhythm.                 Pulmonary/Chest: Effort normal and breath sounds without rales or wheezing.                Abd:  Soft, NT, ND, + BS, no organomegaly               Neurological: Pt is alert. At baseline orientation, motor grossly intact               Skin: Skin is warm. No rashes, no other new lesions, LE edema - none                Psychiatric: Pt behavior is normal without agitation   Micro: none  Cardiac tracings I have personally interpreted today:  none  Pertinent Radiological findings (summarize): none   Lab Results  Component Value Date   WBC 7.9 08/09/2023   HGB 13.4 08/09/2023   HCT 41.3 08/09/2023   PLT 253.0 08/09/2023   GLUCOSE 97 08/09/2023   CHOL 137 08/09/2023   TRIG 94.0 08/09/2023   HDL 31.40 (L) 08/09/2023   LDLDIRECT 148.9 12/11/2012   LDLCALC 87 08/09/2023   ALT 20 08/09/2023   AST 19 08/09/2023   NA 143 08/09/2023   K 4.3 08/09/2023   CL 108 08/09/2023   CREATININE 0.84 08/09/2023   BUN 17 08/09/2023   CO2 28 08/09/2023   TSH 0.51 08/09/2023   INR 2.8 03/28/2018   HGBA1C 6.7 (H) 08/09/2023   MICROALBUR <0.7 08/09/2023   Assessment/Plan:  CAMILIA CAYWOOD is a 68 y.o. Black or African American [2] female with  has a past medical history of  Allergic rhinitis, cause unspecified (04/29/2011), Anemia, Anemia, unspecified (04/23/2011), Back pain, Bilateral swelling of feet and ankles, Chronic anticoagulation, Chronic headaches, Colon polyps (05/2009), Complication of anesthesia, Constipation, Diverticulosis, DVT (deep venous thrombosis) (HCC), GERD (gastroesophageal reflux disease) (11/10/2011), Gout (04/22/2010), H/O blood clots, H/O: GI bleed, Hyperlipidemia, Hyperplastic colon polyp, Hypertension, IBS (irritable bowel syndrome), Infected prosthetic knee joint (HCC), Joint pain, Migraine (04/29/2011), Morbid obesity (HCC), Nonsmoker, Other fatigue, Pre-diabetes, Pulmonary embolism (HCC) (2010), Shortness of breath, SOBOE (shortness of breath on exertion), Vitamin D  deficiency, and WPW (Wolff-Parkinson-White syndrome).  Vitamin D  deficiency Last vitamin D  Lab Results  Component Value Date   VD25OH 31.78 08/09/2023   Low, to start oral replacement   Type 2 diabetes mellitus with hyperglycemia, without long-term current use of insulin (HCC) Lab Results  Component Value  Date   HGBA1C 6.7 (H) 08/09/2023   uncontrolled, pt to continue current medical treatment  - diet, wt control, declines mounjaro for now   HTN (hypertension) BP Readings from Last 3 Encounters:  08/09/23 130/74  08/02/23 (!) 174/86  07/11/23 126/88   Stable, pt to continue medical treatment coreg  6.25 bid, losartan  100 qd   B12 deficiency Lab Results  Component Value Date   VITAMINB12 324 08/09/2023   Stable, cont oral replacement - b12 1000 mcg qd  Followup: Return in about 6 months (around 02/09/2024).  Lynwood Rush, MD 08/09/2023 8:48 PM [facility]

## 2023-08-09 NOTE — Patient Instructions (Signed)
 Please continue all other medications as before, including the antibiotic  Please have the pharmacy call with any other refills you may need.  Please continue your efforts at being more active, low cholesterol diet, and weight control.  Please keep your appointments with your specialists as you may have planned  You will be contacted regarding the referral for: ENT  Please go to the LAB at the blood drawing area for the tests to be done  You will be contacted by phone if any changes need to be made immediately.  Otherwise, you will receive a letter about your results with an explanation, but please check with MyChart first.  Please make an Appointment to return in 6 months, or sooner if needed, also with Lab Appointment for testing done 3-5 days before at the FIRST FLOOR Lab (so this is for TWO appointments - please see the scheduling desk as you leave)

## 2023-08-09 NOTE — Assessment & Plan Note (Signed)
 Lab Results  Component Value Date   VITAMINB12 324 08/09/2023   Stable, cont oral replacement - b12 1000 mcg qd

## 2023-08-09 NOTE — Assessment & Plan Note (Signed)
 Last vitamin D  Lab Results  Component Value Date   VD25OH 31.78 08/09/2023   Low, to start oral replacement

## 2023-08-09 NOTE — Assessment & Plan Note (Signed)
 BP Readings from Last 3 Encounters:  08/09/23 130/74  08/02/23 (!) 174/86  07/11/23 126/88   Stable, pt to continue medical treatment coreg  6.25 bid, losartan  100 qd

## 2023-08-10 ENCOUNTER — Ambulatory Visit: Payer: Self-pay | Admitting: Internal Medicine

## 2023-08-10 NOTE — Progress Notes (Signed)
 The test results show that your current treatment is OK, as the tests are stable.  Please continue the same plan.  There is no other need for change of treatment or further evaluation based on these results, at this time.  thanks

## 2023-09-26 ENCOUNTER — Ambulatory Visit: Admitting: Internal Medicine

## 2023-09-26 ENCOUNTER — Encounter: Payer: Self-pay | Admitting: Internal Medicine

## 2023-09-26 VITALS — BP 130/88 | HR 79 | Temp 97.9°F | Ht 63.0 in | Wt 337.8 lb

## 2023-09-26 DIAGNOSIS — E785 Hyperlipidemia, unspecified: Secondary | ICD-10-CM | POA: Diagnosis not present

## 2023-09-26 DIAGNOSIS — K2970 Gastritis, unspecified, without bleeding: Secondary | ICD-10-CM | POA: Insufficient documentation

## 2023-09-26 DIAGNOSIS — K2900 Acute gastritis without bleeding: Secondary | ICD-10-CM

## 2023-09-26 DIAGNOSIS — E1165 Type 2 diabetes mellitus with hyperglycemia: Secondary | ICD-10-CM

## 2023-09-26 DIAGNOSIS — E538 Deficiency of other specified B group vitamins: Secondary | ICD-10-CM

## 2023-09-26 DIAGNOSIS — E559 Vitamin D deficiency, unspecified: Secondary | ICD-10-CM

## 2023-09-26 DIAGNOSIS — I10 Essential (primary) hypertension: Secondary | ICD-10-CM | POA: Diagnosis not present

## 2023-09-26 DIAGNOSIS — Z5181 Encounter for therapeutic drug level monitoring: Secondary | ICD-10-CM

## 2023-09-26 MED ORDER — CARVEDILOL 6.25 MG PO TABS: ORAL_TABLET | ORAL | 3 refills | Status: AC

## 2023-09-26 MED ORDER — SUCRALFATE 1 G PO TABS: 1.0000 g | ORAL_TABLET | Freq: Three times a day (TID) | ORAL | 0 refills | Status: DC

## 2023-09-26 NOTE — Assessment & Plan Note (Signed)
 Lab Results  Component Value Date   VITAMINB12 324 08/09/2023   Stable, cont oral replacement - b12 1000 mcg qd

## 2023-09-26 NOTE — Assessment & Plan Note (Signed)
 Lab Results  Component Value Date   HGBA1C 6.7 (H) 08/09/2023   Mild uncontrolled,  pt to continue current medical tx - declines GLP1 or OHA for now

## 2023-09-26 NOTE — Assessment & Plan Note (Signed)
 BP Readings from Last 3 Encounters:  09/26/23 130/88  08/09/23 130/74  08/02/23 (!) 174/86   Uncontrolled, ok to restart coreg  6.25 bid, cont losartan  100 mg qd

## 2023-09-26 NOTE — Assessment & Plan Note (Signed)
 Chronic persistent, able to complete all ADLs, provide child are for grandson as well as husband in wheelchair s/p stroke but essentially unable to exercise due to chronic right knee pain.   Pt encouraged to continue working on lower calories.

## 2023-09-26 NOTE — Assessment & Plan Note (Signed)
 Lab Results  Component Value Date   LDLCALC 87 08/09/2023   Uncontrolled, , pt to continue current statin lipitor 80 mg and lower chol diet, declines other change today

## 2023-09-26 NOTE — Progress Notes (Signed)
 Patient ID: Mia [name], female   DOB: [birth year], 68 y.o.   MRN: [id number]        Chief Complaint: follow up HTN, HLD, DM, gastritis, low vit d, morbid obesity       HPI:  Mia [name] is a 68 y.o. 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  Did have EGD with esoph dilation, gastritis .  Pt denies chest pain, increased sob or doe, wheezing, orthopnea, PND, increased LE swelling, palpitations, dizziness or syncope.   Pt denies polydipsia, polyuria, or new focal neuro s/s.    Pt denies fever, wt loss, night sweats, loss of appetite, or other constitutional symptoms  Has ongoing right knee pain after complicated hx of TKR x 4, followed by Dr Ernie., essentially unable to do more exercise    Pt does decline mounjaro for now.  Has been out of coreg  x 2 wks         Wt Readings from Last 3 Encounters:  09/26/23 (!) 337 lb 12.8 oz (153.2 kg)  08/09/23 (!) 338 lb (153.3 kg)  08/02/23 (!) 332 lb (150.6 kg)   BP Readings from Last 3 Encounters:  09/26/23 130/88  08/09/23 130/74  08/02/23 (!) 174/86         Past Medical History:  Diagnosis Date   Allergic rhinitis, cause unspecified 04/29/2011   Anemia    Anemia, unspecified 04/23/2011   Back pain    Bilateral swelling of feet and ankles    Chronic anticoagulation    Chronic headaches    Colon polyps 05/2009   Complication of anesthesia    hard to wake up   Constipation    Diverticulosis    DVT (deep venous thrombosis) (HCC)    right upper extremitiy   GERD (gastroesophageal reflux disease) 11/10/2011   Gout 04/22/2010   no current problems per patient on 12/16/21   H/O blood clots    H/O: GI bleed    Hyperlipidemia    Hyperplastic colon polyp    Hypertension    IBS (irritable bowel syndrome)    Infected prosthetic knee joint    Joint pain    Migraine 04/29/2011   otc meds prn   Morbid  obesity (HCC)    Nonsmoker    Other fatigue    Pre-diabetes    no meds   Pulmonary embolism (HCC) 2010   following right knee replacement   Shortness of breath    with exertion   SOBOE (shortness of breath on exertion)    Vitamin D  deficiency    WPW (Wolff-Parkinson-White syndrome)    Past Surgical History:  Procedure Laterality Date   BIOPSY OF SKIN SUBCUTANEOUS TISSUE AND/OR MUCOUS MEMBRANE  07/11/2023   Procedure: BIOPSY, SKIN, SUBCUTANEOUS TISSUE, OR MUCOUS MEMBRANE;  Surgeon: Charlanne Groom, MD;  Location: [facility];  Service: Gastroenterology;;   CHOLECYSTECTOMY     open surgery with abd incision   COLONOSCOPY     x 7   COLONOSCOPY N/A 07/11/2023   Procedure: COLONOSCOPY;  Surgeon: Charlanne Groom, MD;  Location: [facility];  Service: Gastroenterology;  Laterality: N/A;   DIAGNOSTIC LAPAROSCOPY Left    knee   DIAGNOSTIC LAPAROSCOPY Right    right wrist surgery for cyst   ESOPHAGOGASTRODUODENOSCOPY N/A 07/11/2023   Procedure: EGD (ESOPHAGOGASTRODUODENOSCOPY);  Surgeon: Charlanne Groom, MD;  Location: [facility];  Service:  Gastroenterology;  Laterality: N/A;   KNEE ARTHROSCOPY Left    MALONEY DILATION  07/11/2023   Procedure: DILATION, ESOPHAGUS, USING MALONEY DILATOR;  Surgeon: Charlanne Groom, MD;  Location: [facility];  Service: Gastroenterology;;   MULTIPLE TOOTH EXTRACTIONS     all upper teeth extracted - upper partial   POLYPECTOMY  07/11/2023   Procedure: POLYPECTOMY, INTESTINE;  Surgeon: Charlanne Groom, MD;  Location: [facility];  Service: Gastroenterology;;   RADIOLOGY WITH ANESTHESIA N/A 04/08/2022   Procedure: CT ABDOMEN WITH AND WITHOUT CONTRAST;  Surgeon: Radiologist, Medication, MD;  Location: [facility];  Service: Radiology;  Laterality: N/A;   TOTAL KNEE ARTHROPLASTY Right 01/05/2008   followed by I & D for infection  x 2   TUBAL LIGATION     WISDOM TOOTH EXTRACTION      reports that she has never smoked. She has never used smokeless tobacco. She reports that she does  not drink alcohol and does not use drugs. family history includes Colon cancer in an other family member; Coronary artery disease in an other family member; Diabetes in her father; Heart Problems in her mother; Heart disease in her mother and another family member; Hypertension in her brother, sister, sister, and sister; Pancreatic cancer in her sister; Throat cancer in her father. Allergies  Allergen Reactions   Crestor [Rosuvastatin Calcium ] Other (See Comments)    Chest pain   Amlodipine Other (See Comments)    Edema, headache    Ultram  [Tramadol ] Nausea Only   Zosyn [Piperacillin Sod-Tazobactam So] Other (See Comments)    REACTION: UNSURE   Current Outpatient Medications on File Prior to Visit  Medication Sig Dispense Refill   acetaminophen  (TYLENOL ) 650 MG CR tablet Take 1,300 mg by mouth at bedtime.     acetic acid -hydrocortisone  (VOSOL -HC) OTIC solution Place 3 drops into both ears 2 (two) times daily. 10 mL 0   atorvastatin  (LIPITOR) 80 MG tablet TAKE 1 TABLET(80 MG) BY MOUTH DAILY 90 tablet 3   chlorhexidine  (HIBICLENS ) 4 % external liquid Apply topically daily as needed (to underarms, skin folds). 236 mL 1   Cholecalciferol (VITAMIN D3) 50 MCG (2000 UT) CAPS Take 4,000 Units by mouth daily.     ciclopirox  (PENLAC ) 8 % solution Apply topically at bedtime. Apply over nail and surrounding skin. Apply daily over previous coat. After seven (7) days, may remove with alcohol and continue cycle. 6.6 mL 11   diclofenac  Sodium (VOLTAREN ) 1 % GEL Apply 1 Application topically.     HYDROcodone -acetaminophen  (NORCO/VICODIN) 5-325 MG tablet Take 1 tablet by mouth every 6 (six) hours as needed. 30 tablet 0   losartan  (COZAAR ) 100 MG tablet Take 1 tablet (100 mg total) by mouth daily. 90 tablet 3   pantoprazole  (PROTONIX ) 40 MG tablet Take 1 tablet (40 mg total) by mouth 2 (two) times daily before a meal. 180 tablet 3   polyethylene glycol powder (GLYCOLAX /MIRALAX ) 17 GM/SCOOP powder Take twice  daily ( 17gr) until stooling regular, then once daily 3350 g 1   predniSONE  (STERAPRED UNI-PAK 21 TAB) 10 MG (21) TBPK tablet Take by mouth daily. Take as directed. 21 tablet 0   rivaroxaban  (XARELTO ) 20 MG TABS tablet Take 1 tablet (20 mg total) by mouth daily with supper. 90 tablet 1   rivaroxaban  (XARELTO ) 20 MG TABS tablet Take 1 tablet (20 mg total) by mouth daily with supper. 14 tablet 0   spironolactone  (ALDACTONE ) 25 MG tablet TAKE 1 TABLET(25 MG) BY MOUTH DAILY 90 tablet 3  terbinafine  (LAMISIL ) 250 MG tablet Take 1 tablet (250 mg total) by mouth daily. 42 tablet 1   No current facility-administered medications on file prior to visit.        ROS:  All others reviewed and negative.  Objective        PE:  BP 130/88   Pulse 79   Temp 97.9 F (36.6 C)   Ht 5' 3 (1.6 m)   Wt (!) 337 lb 12.8 oz (153.2 kg)   LMP 04/10/2010   SpO2 96%   BMI 59.84 kg/m                 Constitutional: Pt appears morbid obese               HENT: Head: NCAT.                Right Ear: External ear normal.                 Left Ear: External ear normal.                Eyes: . Pupils are equal, round, and reactive to light. Conjunctivae and EOM are normal               Nose: without d/c or deformity               Neck: Neck supple. Gross normal ROM               Cardiovascular: Normal rate and regular rhythm.                 Pulmonary/Chest: Effort normal and breath sounds without rales or wheezing.                Abd:  Soft, NT, ND, + BS, no organomegaly               Neurological: Pt is alert. At baseline orientation, motor grossly intact               Skin: Skin is warm. No rashes, no other new lesions, LE edema - none               Psychiatric: Pt behavior is normal without agitation   Micro: none  Cardiac tracings I have personally interpreted today:  none  Pertinent Radiological findings (summarize): none   Lab Results  Component Value Date   WBC 7.9 08/09/2023   HGB 13.4 08/09/2023    HCT 41.3 08/09/2023   PLT 253.0 08/09/2023   GLUCOSE 97 08/09/2023   CHOL 137 08/09/2023   TRIG 94.0 08/09/2023   HDL 31.40 (L) 08/09/2023   LDLDIRECT 148.9 12/11/2012   LDLCALC 87 08/09/2023   ALT 20 08/09/2023   AST 19 08/09/2023   NA 143 08/09/2023   K 4.3 08/09/2023   CL 108 08/09/2023   CREATININE 0.84 08/09/2023   BUN 17 08/09/2023   CO2 28 08/09/2023   TSH 0.51 08/09/2023   INR 2.8 03/28/2018   HGBA1C 6.7 (H) 08/09/2023   MICROALBUR <0.7 08/09/2023   Assessment/Plan:  Mia [name] is a 68 y.o. Black or African American [2] female with  has a past medical history of Allergic rhinitis, cause unspecified (04/29/2011), Anemia, Anemia, unspecified (04/23/2011), Back pain, Bilateral swelling of feet and ankles, Chronic anticoagulation, Chronic headaches, Colon polyps (05/2009), Complication of anesthesia, Constipation, Diverticulosis, DVT (deep venous thrombosis) (HCC), GERD (gastroesophageal reflux disease) (11/10/2011), Gout (04/22/2010), H/O blood clots, H/O: GI bleed,  Hyperlipidemia, Hyperplastic colon polyp, Hypertension, IBS (irritable bowel syndrome), Infected prosthetic knee joint, Joint pain, Migraine (04/29/2011), Morbid obesity (HCC), Nonsmoker, Other fatigue, Pre-diabetes, Pulmonary embolism (HCC) (2010), Shortness of breath, SOBOE (shortness of breath on exertion), Vitamin D  deficiency, and WPW (Wolff-Parkinson-White syndrome).  Vitamin D  deficiency Last vitamin D  Lab Results  Component Value Date   VD25OH 31.78 08/09/2023   Low, to start oral replacement   Type 2 diabetes mellitus with hyperglycemia, without long-term current use of insulin (HCC) Lab Results  Component Value Date   HGBA1C 6.7 (H) 08/09/2023   Mild uncontrolled,  pt to continue current medical tx - declines GLP1 or OHA for now   Morbid obesity (HCC) Chronic persistent, able to complete all ADLs, provide child are for grandson as well as husband in wheelchair s/p stroke but essentially  unable to exercise due to chronic right knee pain.   Pt encouraged to continue working on lower calories.    HTN (hypertension) BP Readings from Last 3 Encounters:  09/26/23 130/88  08/09/23 130/74  08/02/23 (!) 174/86   Uncontrolled, ok to restart coreg  6.25 bid, cont losartan  100 mg qd   Dyslipidemia Lab Results  Component Value Date   LDLCALC 87 08/09/2023   Uncontrolled, , pt to continue current statin lipitor 80 mg and lower chol diet, declines other change today   B12 deficiency Lab Results  Component Value Date   VITAMINB12 324 08/09/2023   Stable, cont oral replacement - b12 1000 mcg qd   Gastritis Pt to continue PPI, but add carafate  1 gm qid x 1 mo  Followup: Return in about 6 months (around 03/25/2024).  Lynwood Rush, MD 09/26/2023 2:43 PM [facility]

## 2023-09-26 NOTE — Patient Instructions (Addendum)
 Please take all new medication as prescribed - the carafate   Please continue all other medications as before, and refills have been done to restart the Coreg   Please have the pharmacy call with any other refills you may need.  Please continue your efforts at being more active, low cholesterol diet, and weight control.  Please keep your appointments with your specialists as you may have planned  No need for further lab testing today  Please make an Appointment to return in 6 months, or sooner if needed

## 2023-09-26 NOTE — Assessment & Plan Note (Signed)
 Pt to continue PPI, but add carafate  1 gm qid x 1 mo

## 2023-09-26 NOTE — Assessment & Plan Note (Signed)
 Last vitamin D  Lab Results  Component Value Date   VD25OH 31.78 08/09/2023   Low, to start oral replacement

## 2023-10-01 ENCOUNTER — Other Ambulatory Visit: Payer: Self-pay | Admitting: Cardiology

## 2023-10-01 DIAGNOSIS — Z86718 Personal history of other venous thrombosis and embolism: Secondary | ICD-10-CM

## 2023-10-03 NOTE — Telephone Encounter (Signed)
 Prescription refill request for Xarelto  received.  Indication: DVT Last office visit: 06/01/23 Weight: 337# Age: 68 Scr: 0.84 epic 08/09/23 CrCl: 187

## 2023-10-06 ENCOUNTER — Other Ambulatory Visit: Payer: Self-pay | Admitting: Internal Medicine

## 2023-10-06 ENCOUNTER — Other Ambulatory Visit: Payer: Self-pay | Admitting: *Deleted

## 2023-10-06 DIAGNOSIS — Z86718 Personal history of other venous thrombosis and embolism: Secondary | ICD-10-CM

## 2023-10-06 DIAGNOSIS — Z1231 Encounter for screening mammogram for malignant neoplasm of breast: Secondary | ICD-10-CM

## 2023-10-06 MED ORDER — RIVAROXABAN 20 MG PO TABS: 20.0000 mg | ORAL_TABLET | Freq: Every day | ORAL | 1 refills | Status: AC

## 2023-10-06 MED ORDER — RIVAROXABAN 20 MG PO TABS: 20.0000 mg | ORAL_TABLET | Freq: Every day | ORAL | 1 refills | Status: DC

## 2023-10-06 MED ORDER — RIVAROXABAN 20 MG PO TABS
20.0000 mg | ORAL_TABLET | Freq: Every day | ORAL | 1 refills | Status: DC
Start: 2023-10-06 — End: 2023-10-06

## 2023-10-25 ENCOUNTER — Other Ambulatory Visit: Payer: Self-pay

## 2023-10-25 ENCOUNTER — Other Ambulatory Visit: Payer: Self-pay | Admitting: Internal Medicine

## 2023-11-02 ENCOUNTER — Ambulatory Visit

## 2023-11-14 DIAGNOSIS — I10 Essential (primary) hypertension: Secondary | ICD-10-CM | POA: Diagnosis not present

## 2023-11-14 DIAGNOSIS — G8929 Other chronic pain: Secondary | ICD-10-CM | POA: Diagnosis not present

## 2023-11-14 DIAGNOSIS — R600 Localized edema: Secondary | ICD-10-CM | POA: Diagnosis not present

## 2023-11-14 DIAGNOSIS — E785 Hyperlipidemia, unspecified: Secondary | ICD-10-CM | POA: Diagnosis not present

## 2023-11-14 DIAGNOSIS — D6869 Other thrombophilia: Secondary | ICD-10-CM | POA: Diagnosis not present

## 2023-11-14 DIAGNOSIS — E1165 Type 2 diabetes mellitus with hyperglycemia: Secondary | ICD-10-CM | POA: Diagnosis not present

## 2023-11-14 DIAGNOSIS — Z8249 Family history of ischemic heart disease and other diseases of the circulatory system: Secondary | ICD-10-CM | POA: Diagnosis not present

## 2023-11-14 DIAGNOSIS — Z7901 Long term (current) use of anticoagulants: Secondary | ICD-10-CM | POA: Diagnosis not present

## 2023-11-16 ENCOUNTER — Emergency Department (HOSPITAL_COMMUNITY)
Admission: EM | Admit: 2023-11-16 | Discharge: 2023-11-17 | Discharge status: Against Medical Advice | Attending: Emergency Medicine | Admitting: Emergency Medicine

## 2023-11-16 ENCOUNTER — Encounter (HOSPITAL_COMMUNITY): Payer: Self-pay

## 2023-11-16 ENCOUNTER — Telehealth: Payer: Self-pay | Admitting: Cardiology

## 2023-11-16 ENCOUNTER — Emergency Department (HOSPITAL_COMMUNITY)

## 2023-11-16 ENCOUNTER — Other Ambulatory Visit: Payer: Self-pay

## 2023-11-16 DIAGNOSIS — R1110 Vomiting, unspecified: Secondary | ICD-10-CM | POA: Insufficient documentation

## 2023-11-16 DIAGNOSIS — R0789 Other chest pain: Secondary | ICD-10-CM | POA: Diagnosis not present

## 2023-11-16 DIAGNOSIS — R2243 Localized swelling, mass and lump, lower limb, bilateral: Secondary | ICD-10-CM | POA: Diagnosis not present

## 2023-11-16 DIAGNOSIS — R079 Chest pain, unspecified: Secondary | ICD-10-CM | POA: Insufficient documentation

## 2023-11-16 DIAGNOSIS — R0602 Shortness of breath: Secondary | ICD-10-CM | POA: Diagnosis not present

## 2023-11-16 DIAGNOSIS — Z5321 Procedure and treatment not carried out due to patient leaving prior to being seen by health care provider: Secondary | ICD-10-CM | POA: Insufficient documentation

## 2023-11-16 LAB — BASIC METABOLIC PANEL WITH GFR
Anion gap: 10 (ref 5–15)
BUN: 15 mg/dL (ref 8–23)
CO2: 24 mmol/L (ref 22–32)
Calcium: 9.3 mg/dL (ref 8.9–10.3)
Chloride: 109 mmol/L (ref 98–111)
Creatinine, Ser: 0.83 mg/dL (ref 0.44–1.00)
GFR, Estimated: >60 mL/min (ref >60)
Glucose, Bld: 147 mg/dL — ABNORMAL HIGH (ref 70–99)
Potassium: 3.5 mmol/L (ref 3.5–5.1)
Sodium: 143 mmol/L (ref 135–145)

## 2023-11-16 LAB — TROPONIN I (HIGH SENSITIVITY)
Troponin I (High Sensitivity): 3 ng/L (ref <18)
Troponin I (High Sensitivity): 3 ng/L (ref <18)

## 2023-11-16 LAB — CBC
HCT: 41.8 % (ref 36.0–46.0)
Hemoglobin: 13.4 g/dL (ref 12.0–15.0)
MCH: 27.9 pg (ref 26.0–34.0)
MCHC: 32.1 g/dL (ref 30.0–36.0)
MCV: 86.9 fL (ref 80.0–100.0)
Platelets: 253 K/uL (ref 150–400)
RBC: 4.81 MIL/uL (ref 3.87–5.11)
RDW: 15 % (ref 11.5–15.5)
WBC: 8.1 K/uL (ref 4.0–10.5)
nRBC: 0 % (ref 0.0–0.2)

## 2023-11-16 NOTE — ED Notes (Signed)
 Extra blue tube drawn

## 2023-11-16 NOTE — ED Provider Triage Note (Signed)
 Emergency Medicine Provider Triage Evaluation Note  Mia [name] , a 68 y.o. female  was evaluated in triage.  Pt complains of left sided chest pain that radiates down the left arm to the elbow since noon today and sob with exertion since yesterday. She also noticed bilateral ankle swelling that is new this morning. Denies LOC, dizziness, headache. Denies warmth or pain to the Lower extremities. Hx of blood clots and taking xarelto ; denies missing any doses of this medication. Denies nausea at this time. Reports vomiting after dinner for the last three nights. No OTC meds today. Never experienced pain like this in the past.  Review of Systems  Positive: Chest pain. SOB with exertion. Bilateral ankle swelling.  Negative: Nausea, LOC, dizziness, headache, blurred vision, fevers.  Physical Exam  BP (!) 152/72   Pulse 95   Temp (!) 97.4 F (36.3 C)   Resp 18   Ht 5' 3 (1.6 m)   Wt (!) 154.2 kg   LMP 04/10/2010   SpO2 100%   BMI 60.23 kg/m  Gen:   Awake, no distress.  Resp:  Normal effort. Lung sounds clear. MSK:   Right shoulder pain, chronic and unchanged. No current left arm pain. Bilateral ankle swelling with no warmth or tenderness with palpation. Distal PMS intact bilaterally. Other:  Skin is warm and dry with no pallor or diaphoresis.  Medical Decision Making  Medically screening exam initiated at 5:56 PM.  Appropriate orders placed.  Mia [name] was informed that the remainder of the evaluation will be completed by another provider, this initial triage assessment does not replace that evaluation, and the importance of remaining in the ED until their evaluation is complete.   Rosina Almarie LABOR, PA-C 11/16/23 1804

## 2023-11-16 NOTE — ED Notes (Signed)
Pt stated she was leaving.  

## 2023-11-16 NOTE — Telephone Encounter (Signed)
 Pt  advised to go to ED for evaluation. Although reluctant d/t ED wait, states she will go and be evaluated.

## 2023-11-16 NOTE — Telephone Encounter (Signed)
 Pt c/o of Chest Pain: STAT if active (IN THIS MOMENT) CP, including tightness, pressure, jaw pain, shoulder/upper arm/back pain, SOB, nausea, and vomiting.  1. Are you having CP right now (tightness, pressure, or discomfort)?  Yes   2. Are you experiencing any other symptoms (ex. SOB, nausea, vomiting, sweating)?  Left arm pain  3. How long have you been experiencing CP?  Started around 12:00 PM  4. Is your CP continuous or coming and going?  Continuous   5. Have you taken Nitroglycerin?  ? No

## 2023-11-16 NOTE — ED Triage Notes (Signed)
 Pt c/o chest pain and slight SOB starting around noon.  Pain score 8/10.  Hx of WPW.

## 2023-11-17 ENCOUNTER — Encounter: Payer: Self-pay | Admitting: Internal Medicine

## 2023-11-17 ENCOUNTER — Ambulatory Visit: Payer: Self-pay | Attending: Internal Medicine | Admitting: Internal Medicine

## 2023-11-17 VITALS — BP 154/76 | HR 78 | Ht 63.0 in | Wt 343.0 lb

## 2023-11-17 DIAGNOSIS — R079 Chest pain, unspecified: Secondary | ICD-10-CM

## 2023-11-17 DIAGNOSIS — I10 Essential (primary) hypertension: Secondary | ICD-10-CM

## 2023-11-17 DIAGNOSIS — Z86718 Personal history of other venous thrombosis and embolism: Secondary | ICD-10-CM | POA: Diagnosis not present

## 2023-11-17 NOTE — Telephone Encounter (Signed)
 Pt called in, stating she went to the ED and left due to wait time. She is scheduled for 11/23/23 with Rana, NP. Any precautions or anything she needs to know while waiting for appt.

## 2023-11-17 NOTE — Patient Instructions (Signed)
 Medication Instructions:  Your physician recommends that you continue on your current medications as directed. Please refer to the Current Medication list given to you today.   *If you need a refill on your cardiac medications before your next appointment, please call your pharmacy*  Lab Work: - None ordered   Testing/Procedures:  Dr. Mona has ordered a Lexiscan  Myocardial Perfusion Imaging Study.  Please arrive 15 minutes prior to your appointment time for registration and insurance purposes.   The test will take approximately 3 to 4 hours to complete; you may bring reading material.  If someone comes with you to your appointment, they will need to remain in the main lobby due to limited space in the testing area. **If you are pregnant or breastfeeding, please notify the nuclear lab prior to your appointment**   How to prepare for your Myocardial Perfusion Test: Do not eat or drink 3 hours prior to your test, except you may have water. Do not consume products containing caffeine  (regular or decaffeinated) 12 hours prior to your test. (ex: coffee, chocolate, sodas, tea). Do wear comfortable clothes (no dresses or overalls) and walking shoes, tennis shoes preferred (No heels or open toe shoes are allowed). Do NOT wear cologne, perfume, aftershave, or lotions (deodorant is allowed). If you use an inhaler, use it the AM of your test and bring it with you.  If you use a nebulizer, use it the AM of your test.  If these instructions are not followed, your test will have to be rescheduled.   Follow-Up: At [facility], you and your health needs are our priority.  As part of our continuing mission to provide you with exceptional heart care, our providers are all part of one team.  This team includes your primary Cardiologist (physician) and Advanced Practice Providers or APPs (Physician Assistants and Nurse Practitioners) who all work together to provide you with the care you need, when  you need it.  Your next appointment:   Keep your upcoming appointment on 11/23/23 at 10:55 am with Lum Louis, NP.  We recommend signing up for the patient portal called MyChart.  Sign up information is provided on this After Visit Summary.  MyChart is used to connect with patients for Virtual Visits (Telemedicine).  Patients are able to view lab/test results, encounter notes, upcoming appointments, etc.  Non-urgent messages can be sent to your provider as well.   To learn more about what you can do with MyChart, go to [email or web address].

## 2023-11-17 NOTE — Telephone Encounter (Signed)
 Called patient back about message. Patient stated she went to the ED last night with chest pain and SOB. Patient stated she was there for about 7 hours and could not take it any more. Patient stated her head was hurting and she stated the ER was a mess with patient in the waiting room to not feeling like she was being taking care of, so she went home. Patient is still having symptoms, but they have slightly improved. Patient stated she does have some discomfort when taking deep breaths. Patient has history of DVT and she is on Xarelto . Patient's lab work with in normal range in the ED and troponin was 3. Informed patient we could have her see the DOD today, but the ER is where she should have stayed. Encouraged patient to call 911 if her symptoms get worse. Will forward to Dr. Jeffrie, so he is aware and Dr. Mona the DOD.

## 2023-11-18 NOTE — Progress Notes (Signed)
 OFFICE NOTE  Chief Complaint:  Chest pain  Primary Care Physician: Norleen Lynwood ORN, MD  HPI:  Mia [name] is a 68 y.o. female with a past medial history significant for DVT hypertension, dyslipidemia and IBS as well as morbid obesity, WPW and chronic back pain, who presents for DOD visit regarding chest pain.  She is a patient of Dr. Jeffrie and was seen in the emergency department yesterday with chest pain.  This was described as pain of the central chest that radiated to her left arm.  There was some associated shortness of breath with exertion.  This started fairly suddenly.  She says this is somewhat different than her IBS symptoms in the past.  She had a prior stress test in 2023 which was negative for ischemia and showed normal LV function.  Review of imaging in her chart showed that in April 2024 she had an abdominal CT that did catch the lower chest and was notable for coronary artery calcifications.  ER EKG was personally reviewed and demonstrates normal sinus rhythm and possible anteroseptal infarct pattern.  She said she waited in the ER for 7 hours and eventually left.  Her troponins were negative x 2 and chest x-ray was unremarkable.  PMHx:  Past Medical History:  Diagnosis Date   Allergic rhinitis, cause unspecified 04/29/2011   Anemia    Anemia, unspecified 04/23/2011   Back pain    Bilateral swelling of feet and ankles    Chronic anticoagulation    Chronic headaches    Colon polyps 05/2009   Complication of anesthesia    hard to wake up   Constipation    Diverticulosis    DVT (deep venous thrombosis) (HCC)    right upper extremitiy   GERD (gastroesophageal reflux disease) 11/10/2011   Gout 04/22/2010   no current problems per patient on 12/16/21   H/O blood clots    H/O: GI bleed    Hyperlipidemia    Hyperplastic colon polyp    Hypertension    IBS (irritable bowel syndrome)    Infected prosthetic knee joint    Joint pain    Migraine 04/29/2011   otc  meds prn   Morbid obesity (HCC)    Nonsmoker    Other fatigue    Pre-diabetes    no meds   Pulmonary embolism (HCC) 2010   following right knee replacement   Shortness of breath    with exertion   SOBOE (shortness of breath on exertion)    Vitamin D  deficiency    WPW (Wolff-Parkinson-White syndrome)     Past Surgical History:  Procedure Laterality Date   BIOPSY OF SKIN SUBCUTANEOUS TISSUE AND/OR MUCOUS MEMBRANE  07/11/2023   Procedure: BIOPSY, SKIN, SUBCUTANEOUS TISSUE, OR MUCOUS MEMBRANE;  Surgeon: Charlanne Groom, MD;  Location: [facility];  Service: Gastroenterology;;   CHOLECYSTECTOMY     open surgery with abd incision   COLONOSCOPY     x 7   COLONOSCOPY N/A 07/11/2023   Procedure: COLONOSCOPY;  Surgeon: Charlanne Groom, MD;  Location: [facility];  Service: Gastroenterology;  Laterality: N/A;   DIAGNOSTIC LAPAROSCOPY Left    knee   DIAGNOSTIC LAPAROSCOPY Right    right wrist surgery for cyst   ESOPHAGOGASTRODUODENOSCOPY N/A 07/11/2023   Procedure: EGD (ESOPHAGOGASTRODUODENOSCOPY);  Surgeon: Charlanne Groom, MD;  Location: [facility];  Service: Gastroenterology;  Laterality: N/A;   KNEE ARTHROSCOPY Left    MALONEY DILATION  07/11/2023   Procedure: DILATION, ESOPHAGUS, USING MALONEY DILATOR;  Surgeon: Charlanne,  Lynnie, MD;  Location: [facility];  Service: Gastroenterology;;   MULTIPLE TOOTH EXTRACTIONS     all upper teeth extracted - upper partial   POLYPECTOMY  07/11/2023   Procedure: POLYPECTOMY, INTESTINE;  Surgeon: Charlanne Lynnie, MD;  Location: [facility];  Service: Gastroenterology;;   RADIOLOGY WITH ANESTHESIA N/A 04/08/2022   Procedure: CT ABDOMEN WITH AND WITHOUT CONTRAST;  Surgeon: Radiologist, Medication, MD;  Location: [facility];  Service: Radiology;  Laterality: N/A;   TOTAL KNEE ARTHROPLASTY Right 01/05/2008   followed by I & D for infection  x 2   TUBAL LIGATION     WISDOM TOOTH EXTRACTION      FAMHx:  Family History  Problem Relation Age of Onset   Heart disease  Mother    Heart Problems Mother    Diabetes Father    Throat cancer Father    Pancreatic cancer Sister    Hypertension Sister    Hypertension Sister    Hypertension Sister    Hypertension Brother    Colon cancer Other    Coronary artery disease Other    Heart disease Other     SOCHx:   reports that she has never smoked. She has never used smokeless tobacco. She reports that she does not drink alcohol and does not use drugs.  ALLERGIES:  Allergies  Allergen Reactions   Crestor [Rosuvastatin Calcium ] Other (See Comments)    Chest pain   Amlodipine Other (See Comments)    Edema, headache    Ultram  [Tramadol ] Nausea Only   Zosyn [Piperacillin Sod-Tazobactam So] Other (See Comments)    REACTION: UNSURE    ROS: Pertinent items noted in HPI and remainder of comprehensive ROS otherwise negative.  HOME MEDS: Current Outpatient Medications on File Prior to Visit  Medication Sig Dispense Refill   acetaminophen  (TYLENOL ) 650 MG CR tablet Take 1,300 mg by mouth at bedtime.     acetic acid -hydrocortisone  (VOSOL -HC) OTIC solution Place 3 drops into both ears 2 (two) times daily. 10 mL 0   atorvastatin  (LIPITOR) 80 MG tablet TAKE 1 TABLET(80 MG) BY MOUTH DAILY 90 tablet 3   carvedilol  (COREG ) 6.25 MG tablet TAKE 1 TABLET(6.25 MG) BY MOUTH TWICE DAILY 180 tablet 3   ciclopirox  (PENLAC ) 8 % solution Apply topically at bedtime. Apply over nail and surrounding skin. Apply daily over previous coat. After seven (7) days, may remove with alcohol and continue cycle. 6.6 mL 11   diclofenac  Sodium (VOLTAREN ) 1 % GEL Apply 1 Application topically.     losartan  (COZAAR ) 100 MG tablet Take 1 tablet (100 mg total) by mouth daily. 90 tablet 3   pantoprazole  (PROTONIX ) 40 MG tablet Take 1 tablet (40 mg total) by mouth 2 (two) times daily before a meal. 180 tablet 3   polyethylene glycol powder (GLYCOLAX /MIRALAX ) 17 GM/SCOOP powder Take twice daily ( 17gr) until stooling regular, then once daily 3350 g 1    rivaroxaban  (XARELTO ) 20 MG TABS tablet Take 1 tablet (20 mg total) by mouth daily with supper. 90 tablet 1   spironolactone  (ALDACTONE ) 25 MG tablet TAKE 1 TABLET(25 MG) BY MOUTH DAILY 90 tablet 3   sucralfate  (CARAFATE ) 1 g tablet TAKE 1 TABLET(1 GRAM) BY MOUTH FOUR TIMES DAILY( AT BEDTIME AND WITH MEALS) 120 tablet 0   terbinafine  (LAMISIL ) 250 MG tablet Take 1 tablet (250 mg total) by mouth daily. 42 tablet 1   chlorhexidine  (HIBICLENS ) 4 % external liquid Apply topically daily as needed (to underarms, skin folds). (Patient not taking: Reported  on 11/17/2023) 236 mL 1   Cholecalciferol (VITAMIN D3) 50 MCG (2000 UT) CAPS Take 4,000 Units by mouth daily. (Patient not taking: Reported on 11/17/2023)     HYDROcodone -acetaminophen  (NORCO/VICODIN) 5-325 MG tablet Take 1 tablet by mouth every 6 (six) hours as needed. (Patient not taking: Reported on 11/17/2023) 30 tablet 0   predniSONE  (STERAPRED UNI-PAK 21 TAB) 10 MG (21) TBPK tablet Take by mouth daily. Take as directed. (Patient not taking: Reported on 11/17/2023) 21 tablet 0   No current facility-administered medications on file prior to visit.    LABS/IMAGING: Results for orders placed or performed during the hospital encounter of 11/16/23 (from the past 48 hours)  Basic metabolic panel     Status: Abnormal   Collection Time: 11/16/23  5:57 PM  Result Value Ref Range   Sodium 143 135 - 145 mmol/L   Potassium 3.5 3.5 - 5.1 mmol/L   Chloride 109 98 - 111 mmol/L   CO2 24 22 - 32 mmol/L   Glucose, Bld 147 (H) 70 - 99 mg/dL    Comment: Glucose reference range applies only to samples taken after fasting for at least 8 hours.   BUN 15 8 - 23 mg/dL   Creatinine, Ser 9.16 0.44 - 1.00 mg/dL   Calcium  9.3 8.9 - 10.3 mg/dL   GFR, Estimated >39 >39 mL/min    Comment: (NOTE) Calculated using the CKD-EPI Creatinine Equation (2021)    Anion gap 10 5 - 15    Comment: Performed at [facility], [address], [place], [place] [address]  CBC      Status: None   Collection Time: 11/16/23  5:57 PM  Result Value Ref Range   WBC 8.1 4.0 - 10.5 K/uL   RBC 4.81 3.87 - 5.11 MIL/uL   Hemoglobin 13.4 12.0 - 15.0 g/dL   HCT 58.1 63.9 - 53.9 %   MCV 86.9 80.0 - 100.0 fL   MCH 27.9 26.0 - 34.0 pg   MCHC 32.1 30.0 - 36.0 g/dL   RDW 84.9 88.4 - 84.4 %   Platelets 253 150 - 400 K/uL   nRBC 0.0 0.0 - 0.2 %    Comment: Performed at [facility], [address], [place], [place] [address]  Troponin I (High Sensitivity)     Status: None   Collection Time: 11/16/23  5:57 PM  Result Value Ref Range   Troponin I (High Sensitivity) 3 <18 ng/L    Comment: (NOTE) Elevated high sensitivity troponin I (hsTnI) values and significant  changes across serial measurements may suggest ACS but many other  chronic and acute conditions are known to elevate hsTnI results.  Refer to the Links section for chest pain algorithms and additional  guidance. Performed at [facility], [address], [place], [place] [address]   Troponin I (High Sensitivity)     Status: None   Collection Time: 11/16/23  7:57 PM  Result Value Ref Range   Troponin I (High Sensitivity) 3 <18 ng/L    Comment: (NOTE) Elevated high sensitivity troponin I (hsTnI) values and significant  changes across serial measurements may suggest ACS but many other  chronic and acute conditions are known to elevate hsTnI results.  Refer to the Links section for chest pain algorithms and additional  guidance. Performed at [facility], [address] [place], [place] [address]    DG Chest 1 View Result Date: 11/16/2023 CLINICAL DATA:  Shortness of breath EXAM: CHEST  1 VIEW COMPARISON:  Chest x-ray 03/16/2016 FINDINGS: The heart size and mediastinal contours are within normal limits. Both lungs are clear. The visualized skeletal structures are unremarkable. IMPRESSION: No active disease. Electronically Signed   By: Greig Pique M.D.   On: 11/16/2023 19:00    LIPID  PANEL:    Component Value Date/Time   CHOL 137 08/09/2023 1345   CHOL 186 11/24/2020 1523   TRIG 94.0 08/09/2023 1345   HDL 31.40 (L) 08/09/2023 1345   HDL 43 11/24/2020 1523   CHOLHDL 4 08/09/2023 1345   VLDL 18.8 08/09/2023 1345   LDLCALC 87 08/09/2023 1345   LDLCALC 119 (H) 11/24/2020 1523   LDLCALC 153 (H) 08/09/2019 1439   LDLDIRECT 148.9 12/11/2012 0934    No results found for: LIPOA     WEIGHTS: Wt Readings from Last 3 Encounters:  11/17/23 (!) 343 lb (155.6 kg)  11/16/23 (!) 340 lb (154.2 kg)  09/26/23 (!) 337 lb 12.8 oz (153.2 kg)    VITALS: BP (!) 154/76   Pulse 78   Ht 5' 3 (1.6 m)   Wt (!) 343 lb (155.6 kg)   LMP 04/10/2010   SpO2 93%   BMI 60.76 kg/m   EXAM: General appearance: alert, no distress, and morbidly obese Lungs: clear to auscultation bilaterally Heart: regular rate and rhythm, S1, S2 normal, no murmur, click, rub or gallop Extremities: extremities normal, atraumatic, no cyanosis or edema Neurologic: Grossly normal  EKG: ER EKG from 11/16/2023 shows sinus rhythm with possible anteroseptal infarct pattern- personally reviewed  ASSESSMENT: Chest pain, possibly angina Coronary artery calcification Morbid obesity Hypertension Dyslipidemia  PLAN: 1.   Ms. Buendia has been describing chest pain which is different for her.  She does have some coronary artery calcification seen on earlier imaging studies.  Troponins were fortunately negative.  She is not currently having anginal pain.  Her pain was somewhat constant which seems less likely to be angina but did radiate to her left arm and was in the left central chest.  EKG shows some mild abnormalities.  I would recommend a repeat ischemia evaluation.  Will proceed with a Lexiscan  Myoview  stress test.  Plan follow-up afterwards with Dr. Jeffrie or APP.  Vinie KYM Maxcy, MD, [facility], FNLA, FACP  [place]  [facility]  Medical Director of the Advanced Lipid Disorders &  Cardiovascular Risk  Reduction Clinic Diplomate of the American Board of Clinical Lipidology Attending Cardiologist  Direct Dial: [phone]  Fax: [phone]  Website:  [email or web address] Vinie BROCKS Pranav Lince 11/18/2023, 4:59 PM

## 2023-11-21 ENCOUNTER — Other Ambulatory Visit: Payer: Self-pay | Admitting: Internal Medicine

## 2023-11-21 DIAGNOSIS — R079 Chest pain, unspecified: Secondary | ICD-10-CM

## 2023-11-22 ENCOUNTER — Telehealth (HOSPITAL_COMMUNITY): Payer: Self-pay | Admitting: *Deleted

## 2023-11-22 ENCOUNTER — Ambulatory Visit
Admission: RE | Admit: 2023-11-22 | Discharge: 2023-11-22 | Disposition: A | Discharge status: Home / Self Care | Source: Ambulatory Visit | Attending: Internal Medicine | Admitting: Internal Medicine

## 2023-11-22 DIAGNOSIS — Z1231 Encounter for screening mammogram for malignant neoplasm of breast: Secondary | ICD-10-CM | POA: Diagnosis not present

## 2023-11-22 NOTE — Telephone Encounter (Signed)
 Pt given instructions for Stress Test.

## 2023-11-23 ENCOUNTER — Ambulatory Visit: Admitting: Emergency Medicine

## 2023-11-24 ENCOUNTER — Ambulatory Visit (HOSPITAL_COMMUNITY)
Admission: RE | Admit: 2023-11-24 | Discharge: 2023-11-24 | Disposition: A | Discharge status: Home / Self Care | Source: Ambulatory Visit | Attending: Student in an Organized Health Care Education/Training Program | Admitting: Student in an Organized Health Care Education/Training Program

## 2023-11-24 DIAGNOSIS — R079 Chest pain, unspecified: Secondary | ICD-10-CM | POA: Diagnosis not present

## 2023-11-24 LAB — MYOCARDIAL PERFUSION IMAGING
LV dias vol: 94 mL (ref 46–106)
LV sys vol: 24 mL (ref 3.8–5.2)
Nuc Stress EF: 74 %
Peak HR: 86 {beats}/min
Rest HR: 67 {beats}/min
Rest Nuclear Isotope Dose: 13 mCi
SDS: 1
SRS: 7
SSS: 2
ST Depression (mm): 0 mm
Stress Nuclear Isotope Dose: 38 mCi
TID: 0.95

## 2023-11-24 MED ORDER — TECHNETIUM TC 99M TETROFOSMIN IV KIT
38.0000 | PACK | Freq: Once | INTRAVENOUS | Status: AC | PRN
  Administered 2023-11-24: 38 via INTRAVENOUS

## 2023-11-24 MED ORDER — REGADENOSON 0.4 MG/5ML IV SOLN
INTRAVENOUS | Status: AC
  Filled 2023-11-24: qty 5

## 2023-11-24 MED ORDER — TECHNETIUM TC 99M TETROFOSMIN IV KIT
13.0000 | PACK | Freq: Once | INTRAVENOUS | Status: AC | PRN
Start: 2023-11-24 — End: 2023-11-24
  Administered 2023-11-24: 13 via INTRAVENOUS

## 2023-11-24 MED ORDER — REGADENOSON 0.4 MG/5ML IV SOLN
0.4000 mg | Freq: Once | INTRAVENOUS | Status: AC
  Administered 2023-11-24: 0.4 mg via INTRAVENOUS

## 2023-11-25 ENCOUNTER — Ambulatory Visit: Payer: Self-pay | Admitting: Internal Medicine

## 2023-11-28 ENCOUNTER — Other Ambulatory Visit: Payer: Self-pay

## 2023-11-28 ENCOUNTER — Other Ambulatory Visit: Payer: Self-pay | Admitting: Internal Medicine

## 2023-11-29 ENCOUNTER — Encounter: Payer: Self-pay | Admitting: Cardiology

## 2023-11-29 ENCOUNTER — Ambulatory Visit: Payer: Self-pay | Attending: Cardiology | Admitting: Cardiology

## 2023-11-29 VITALS — BP 116/76 | HR 65 | Resp 16 | Ht 63.0 in | Wt 341.8 lb

## 2023-11-29 DIAGNOSIS — Z86718 Personal history of other venous thrombosis and embolism: Secondary | ICD-10-CM

## 2023-11-29 DIAGNOSIS — I456 Pre-excitation syndrome: Secondary | ICD-10-CM | POA: Diagnosis not present

## 2023-11-29 DIAGNOSIS — R079 Chest pain, unspecified: Secondary | ICD-10-CM

## 2023-11-29 DIAGNOSIS — I10 Essential (primary) hypertension: Secondary | ICD-10-CM | POA: Diagnosis not present

## 2023-11-29 NOTE — Patient Instructions (Signed)
  Follow-Up: At [facility], you and your health needs are our priority.  As part of our continuing mission to provide you with exceptional heart care, our providers are all part of one team.  This team includes your primary Cardiologist (physician) and Advanced Practice Providers or APPs (Physician Assistants and Nurse Practitioners) who all work together to provide you with the care you need, when you need it.  Your next appointment:   1 year(s)  Provider:   Oneil Parchment, MD

## 2023-11-29 NOTE — Progress Notes (Signed)
 Cardiology Office Note:  .   Date:  11/29/2023  ID:  RHEAGAN NAYAK, DOB 09-01-1955, MRN [id number] PCP: Norleen Lynwood ORN, MD  [facility] Providers Cardiologist:  Oneil Parchment, MD {  History of Present Illness: .   IRAIDA CRAGIN is a 68 y.o. female with history of WPW, DVT/PE, hypertension, hyperlipidemia, IBS, morbid obesity, eosinophilic esophagitis     Chest pain 2023 negative Myoview . 11/2023 ED visit with central chest pain radiating to her left arm accompanied with shortness of breath with exertion.  Symptoms felt to be different than her IBS.  EKG with anteroseptal infarct pattern, negative troponins x 2.  Left prior to finalize workup, 11/2023 low risk Myoview   WPW Noted on EKG 2000 2023 heart monitor benign.  Social history       Patient with remote history of WPW without any other significant cardiac history.  She was recently seen   by Dr. Mona after ED visit for chest pain without any significant workup.  He had repeat Myoview  which was normal.  Today patient presents for follow-up after her stress test.  She has not had any reoccurrences of the chest pain.  Reflecting back she feels that she has been under a lot of stress recently and with very poor sleep and only sleeping approximately 3 hours overnight.  Feels that maybe she has had some increased physiologic stress that might have contributed to her chest pain.  She continues to work on weight loss however this has been very difficult for her because anytime she does eat which is only once a day she has vomiting and abdominal pain.  She has a endoscopy done in July of this year indicating eosinophilic esophagitis and also had colonoscopy with some polyps noted.  Otherwise she is doing well, she is very active around the house despite knee related pain.  No episodes of tachycardia.  No significant shortness of breath or peripheral edema.  ROS: Denies: Chest pain, shortness of breath, orthopnea, peripheral  edema, palpitations, decreased exercise intolerance, fatigue, lightheadedness.   Studies Reviewed: .         Risk Assessment/Calculations:             Physical Exam:   VS:  BP 116/76 (BP Location: Left Arm, Patient Position: Sitting, Cuff Size: Large)   Pulse 65   Resp 16   Ht 5' 3 (1.6 m)   Wt (!) 341 lb 12.8 oz (155 kg)   LMP 04/10/2010   SpO2 94%   BMI 60.55 kg/m    Wt Readings from Last 3 Encounters:  11/29/23 (!) 341 lb 12.8 oz (155 kg)  11/17/23 (!) 343 lb (155.6 kg)  11/16/23 (!) 340 lb (154.2 kg)    GEN: Well nourished, well developed in no acute distress NECK: No JVD; No carotid bruits CARDIAC: RRR, no murmurs, rubs, gallops RESPIRATORY:  Clear to auscultation without rales, wheezing or rhonchi  ABDOMEN: Soft, non-tender, non-distended EXTREMITIES:  No edema; No deformity   ASSESSMENT AND PLAN: .    Chest pain -2023/11/2023 normal Myoview 's Reassuring workup as above.  No further reoccurrences.  WPW 2000 seems to be last time this was documented.  Benign heart monitor in 2023.  Overall seems to be low risk.  If she ever becomes symptomatic would consider ablation.  Chronically has been on beta-blocker, typically this is avoided given concerns of impulses going down accessory pathway but seems asymptomatic and overall low risk.  Will review with MD if they feel this  needs to be changed.  Hypertension Well-controlled.  Continue with carvedilol  6.25 mg twice daily, losartan  100 mg, spironolactone  25 mg.  Hyperlipidemia Continue with atorvastatin  80 mg.  BMI 60.5. Weight loss for her has been difficult.  She is previously enrolled in a [facility] program and without much success and only lost about 8 pounds.  Additionally this is exacerbated by her esophagitis and knee related pains making ambulation painful for her.  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  Dispo: 1 year follow-up with Dr. Jeffrie  Signed, Thom LITTIE Sluder, PA-C

## 2024-01-25 ENCOUNTER — Telehealth: Payer: Self-pay

## 2024-01-25 NOTE — Telephone Encounter (Signed)
 SABRA

## 2024-03-26 ENCOUNTER — Ambulatory Visit: Admitting: Internal Medicine
